# Patient Record
Sex: Female | Born: 1965 | Race: White | Hispanic: No | Smoking: Never smoker
Health system: Southern US, Community
[De-identification: ages and names within clinical notes are randomized; demographics above are authoritative.]

## PROBLEM LIST (undated history)

## (undated) DIAGNOSIS — E039 Hypothyroidism, unspecified: Secondary | ICD-10-CM

## (undated) DIAGNOSIS — Z9221 Personal history of antineoplastic chemotherapy: Secondary | ICD-10-CM

## (undated) DIAGNOSIS — Z806 Family history of leukemia: Secondary | ICD-10-CM

## (undated) DIAGNOSIS — R112 Nausea with vomiting, unspecified: Secondary | ICD-10-CM

## (undated) DIAGNOSIS — T8859XA Other complications of anesthesia, initial encounter: Secondary | ICD-10-CM

## (undated) DIAGNOSIS — Z800 Family history of malignant neoplasm of digestive organs: Secondary | ICD-10-CM

## (undated) DIAGNOSIS — M1990 Unspecified osteoarthritis, unspecified site: Secondary | ICD-10-CM

## (undated) DIAGNOSIS — R519 Headache, unspecified: Secondary | ICD-10-CM

## (undated) DIAGNOSIS — Z801 Family history of malignant neoplasm of trachea, bronchus and lung: Secondary | ICD-10-CM

## (undated) DIAGNOSIS — F32A Depression, unspecified: Secondary | ICD-10-CM

## (undated) DIAGNOSIS — J45909 Unspecified asthma, uncomplicated: Secondary | ICD-10-CM

## (undated) DIAGNOSIS — Z9889 Other specified postprocedural states: Secondary | ICD-10-CM

## (undated) DIAGNOSIS — T4145XA Adverse effect of unspecified anesthetic, initial encounter: Secondary | ICD-10-CM

## (undated) DIAGNOSIS — Z808 Family history of malignant neoplasm of other organs or systems: Secondary | ICD-10-CM

## (undated) DIAGNOSIS — F329 Major depressive disorder, single episode, unspecified: Secondary | ICD-10-CM

## (undated) DIAGNOSIS — C801 Malignant (primary) neoplasm, unspecified: Secondary | ICD-10-CM

## (undated) HISTORY — PX: SKIN GRAFT: SHX250

## (undated) HISTORY — PX: OTHER SURGICAL HISTORY: SHX169

## (undated) HISTORY — DX: Family history of malignant neoplasm of trachea, bronchus and lung: Z80.1

## (undated) HISTORY — DX: Family history of malignant neoplasm of other organs or systems: Z80.8

## (undated) HISTORY — PX: ABDOMINAL HYSTERECTOMY: SHX81

## (undated) HISTORY — DX: Family history of leukemia: Z80.6

## (undated) HISTORY — DX: Family history of malignant neoplasm of digestive organs: Z80.0

## (undated) HISTORY — PX: TONSILLECTOMY: SUR1361

---
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
## 1998-10-03 ENCOUNTER — Other Ambulatory Visit: Admission: RE | Admit: 1998-10-03 | Discharge: 1998-10-03 | Payer: Self-pay | Admitting: Obstetrics and Gynecology

## 1998-11-03 ENCOUNTER — Encounter: Admission: RE | Admit: 1998-11-03 | Discharge: 1998-11-03 | Payer: Self-pay | Admitting: Family Medicine

## 2000-01-04 ENCOUNTER — Other Ambulatory Visit: Admission: RE | Admit: 2000-01-04 | Discharge: 2000-01-04 | Payer: Self-pay | Admitting: Obstetrics and Gynecology

## 2000-02-06 HISTORY — PX: OTHER SURGICAL HISTORY: SHX169

## 2001-01-06 ENCOUNTER — Other Ambulatory Visit: Admission: RE | Admit: 2001-01-06 | Discharge: 2001-01-06 | Payer: Self-pay | Admitting: Obstetrics and Gynecology

## 2001-08-22 ENCOUNTER — Encounter: Admission: RE | Admit: 2001-08-22 | Discharge: 2001-08-22 | Payer: Self-pay | Admitting: Family Medicine

## 2002-01-20 ENCOUNTER — Other Ambulatory Visit: Admission: RE | Admit: 2002-01-20 | Discharge: 2002-01-20 | Payer: Self-pay | Admitting: Obstetrics and Gynecology

## 2003-01-06 ENCOUNTER — Encounter (INDEPENDENT_AMBULATORY_CARE_PROVIDER_SITE_OTHER): Payer: Self-pay | Admitting: *Deleted

## 2003-01-06 LAB — CONVERTED CEMR LAB

## 2003-01-14 ENCOUNTER — Other Ambulatory Visit: Admission: RE | Admit: 2003-01-14 | Discharge: 2003-01-14 | Payer: Self-pay | Admitting: Obstetrics and Gynecology

## 2003-05-24 ENCOUNTER — Encounter: Admission: RE | Admit: 2003-05-24 | Discharge: 2003-05-24 | Payer: Self-pay | Admitting: Family Medicine

## 2004-03-07 ENCOUNTER — Ambulatory Visit (HOSPITAL_COMMUNITY): Admission: RE | Admit: 2004-03-07 | Discharge: 2004-03-07 | Payer: Self-pay | Admitting: Obstetrics and Gynecology

## 2004-03-23 ENCOUNTER — Encounter: Admission: RE | Admit: 2004-03-23 | Discharge: 2004-03-23 | Payer: Self-pay | Admitting: Obstetrics and Gynecology

## 2005-03-05 ENCOUNTER — Encounter: Admission: RE | Admit: 2005-03-05 | Discharge: 2005-03-05 | Payer: Self-pay | Admitting: Obstetrics and Gynecology

## 2005-03-05 IMAGING — MG MM MAMMO DIAG BILATERAL
5 series · 5 of 5 positions shown · non-contrast
Comparison: none

DIGITAL BILATERAL DIAGNOSTIC MAMMOGRAM WITH CAD AND RIGHT BREAST ULTRASOUND:
CLINICAL DATA: The patient's physician palpates a small lump at 6 o'clock beneath the areola.

[R CC]
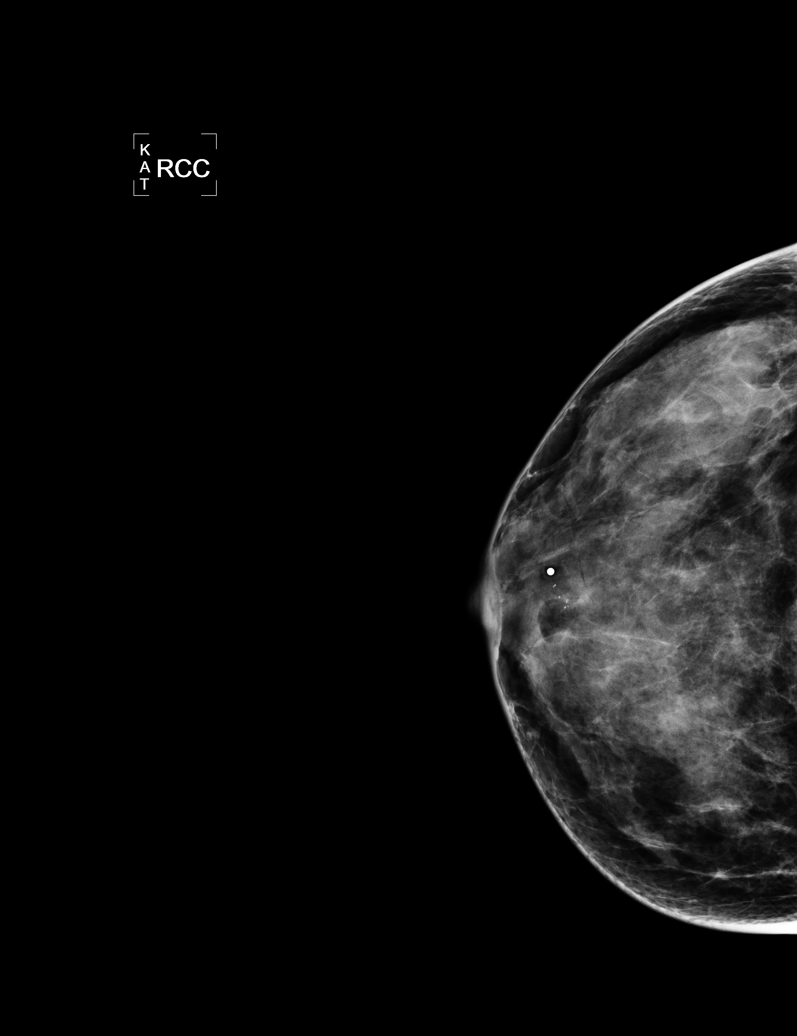

[R MLO]
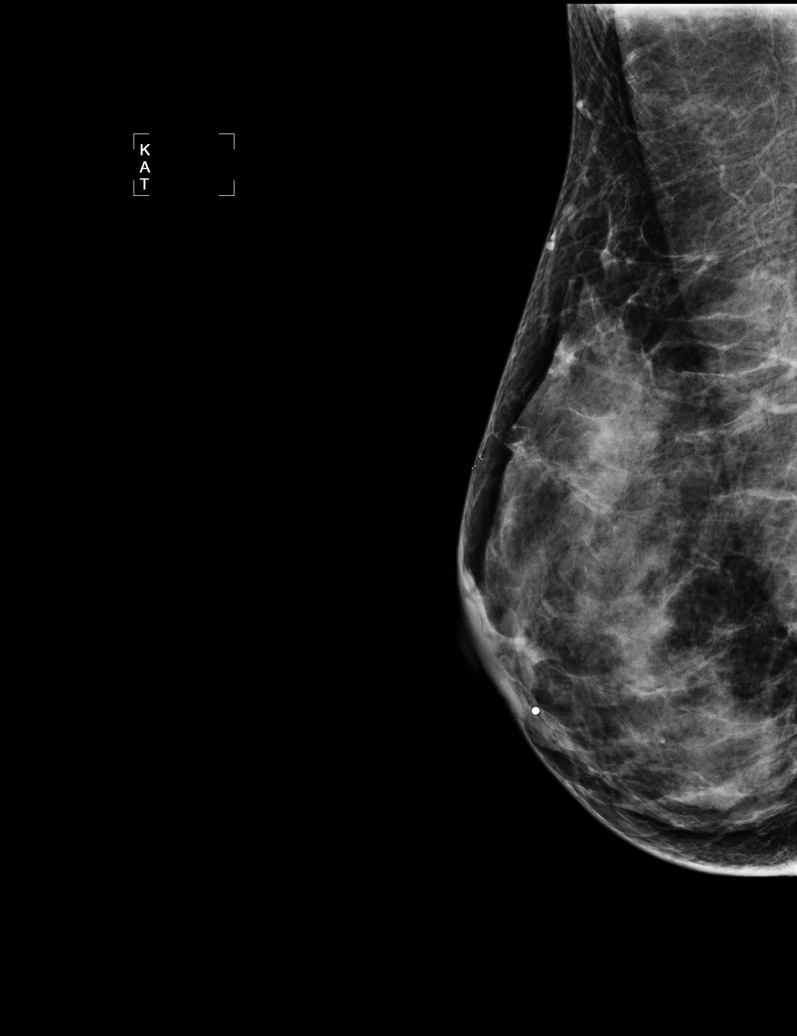

[L CC]
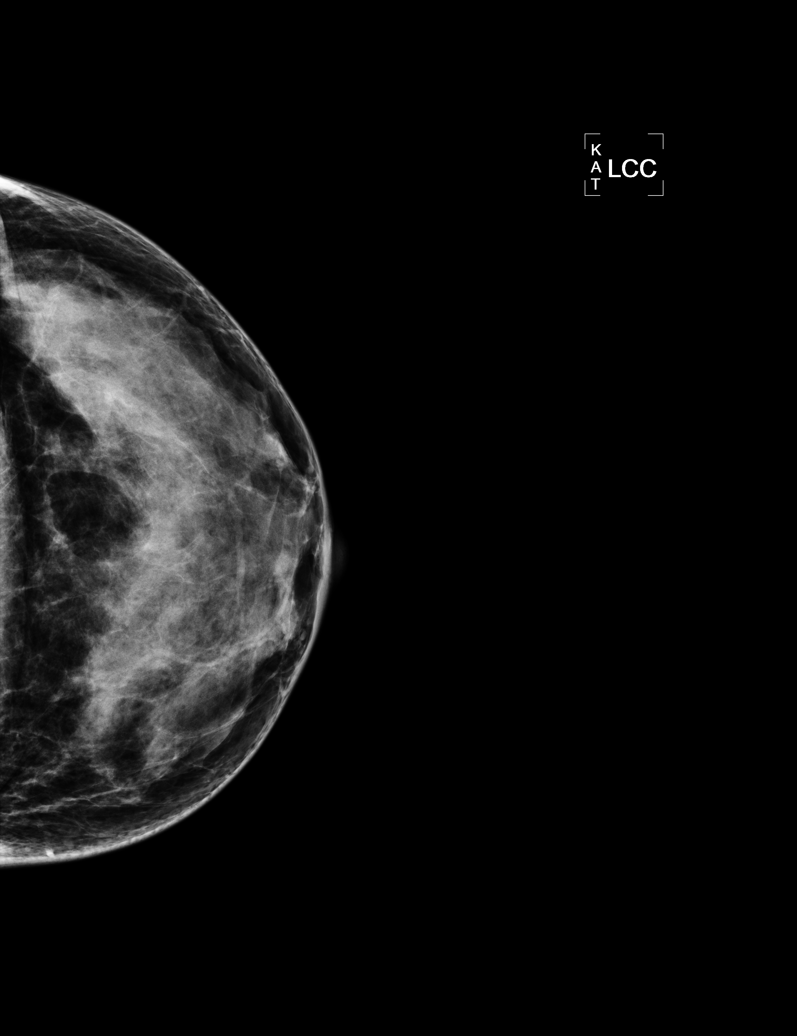

[L MLO]
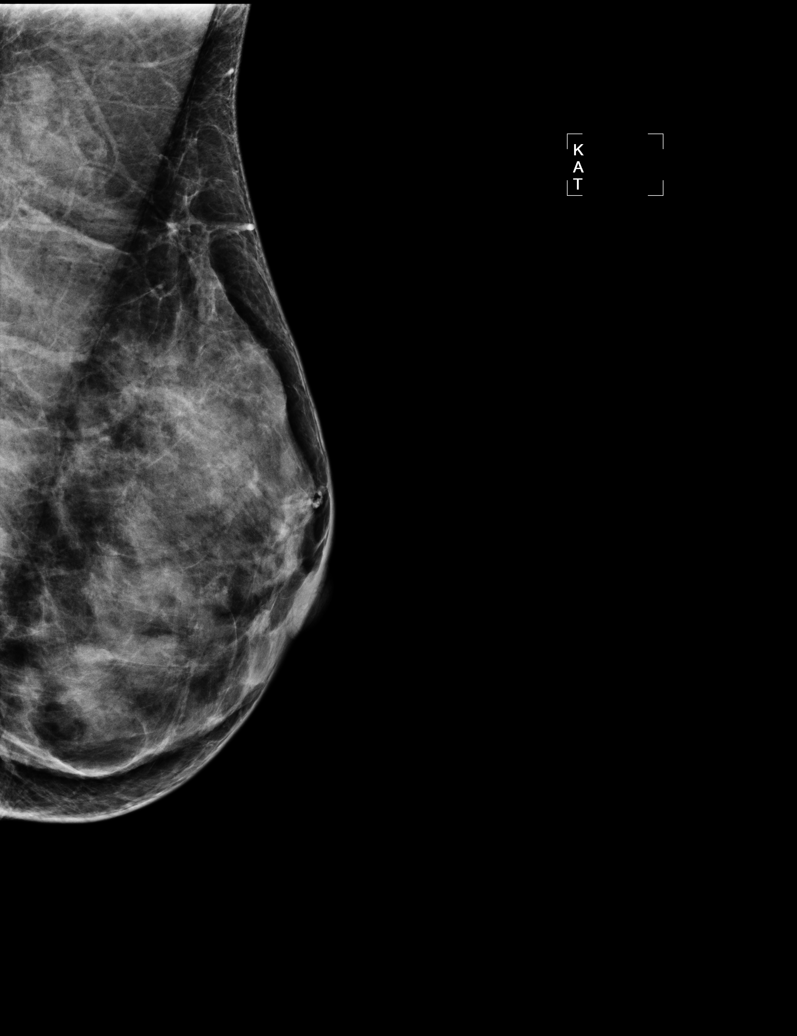

[R TAN]
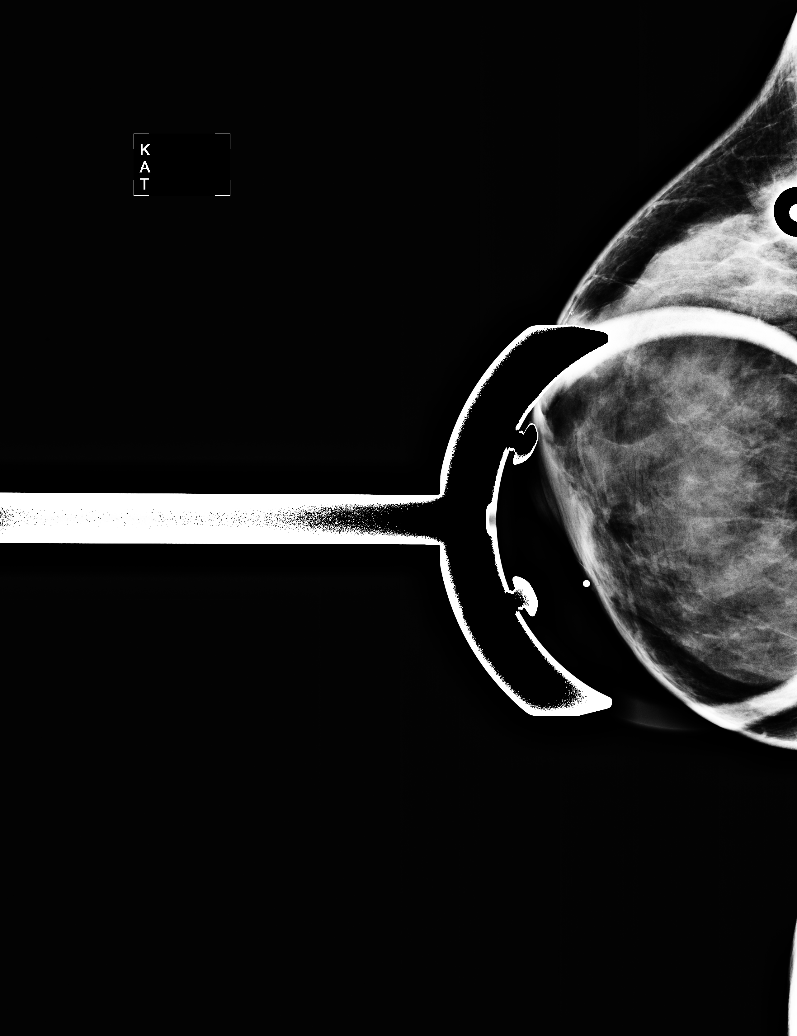

[5 of 5 positions shown; findings below may reference images not displayed]

Comparison is made to prior study dated [DATE].  The breast tissue is quite dense.  There is no 
dominant mass, architectural distortion or calcification to suggest malignancy.  Previously noted 
calcifications in the upper portion of the right breast are felt to be dermal in location and 
benign in etiology.

On physical examination, I palpate a soft flat nodular area at 6 o'clock beneath the areola.  
Sonography demonstrates predominantly dense fibroglandular tissue. There is a subcentimeter area of
what appears to be clustered cysts.  This likely represents a focus of developing apocrine 
metaplasia.  Six month follow-up sonography would be suggested.
IMPRESSION: The palpable finding at 6 o'clock in the subareolar region of the right breast corresponds to 
predominantly dense fibroglandular tissue. There is a subcentimeter focus of tiny clustered cysts, 
likely representing developing apocrine metaplasia.  Six month follow-up sonography is suggested.

ASSESSMENT: Probably benign - BI-RADS 3

Ultrasound of the right breast in 6 months.

## 2005-08-30 ENCOUNTER — Encounter: Admission: RE | Admit: 2005-08-30 | Discharge: 2005-08-30 | Payer: Self-pay | Admitting: Obstetrics and Gynecology

## 2005-08-30 IMAGING — US UNKNOWN US STUDY
1 series · 6 of 6 positions shown · non-contrast
Comparison: [DATE].

RIGHT BREAST ULTRASOUND

RIGHT BREAST ULTRASOUND:
CLINICAL DATA: 40-year-old female with right breast lump, six-month ultrasound reevaluation.

[Series 2: unknown us study · 6 of 6 slices shown]
[im 1/6]
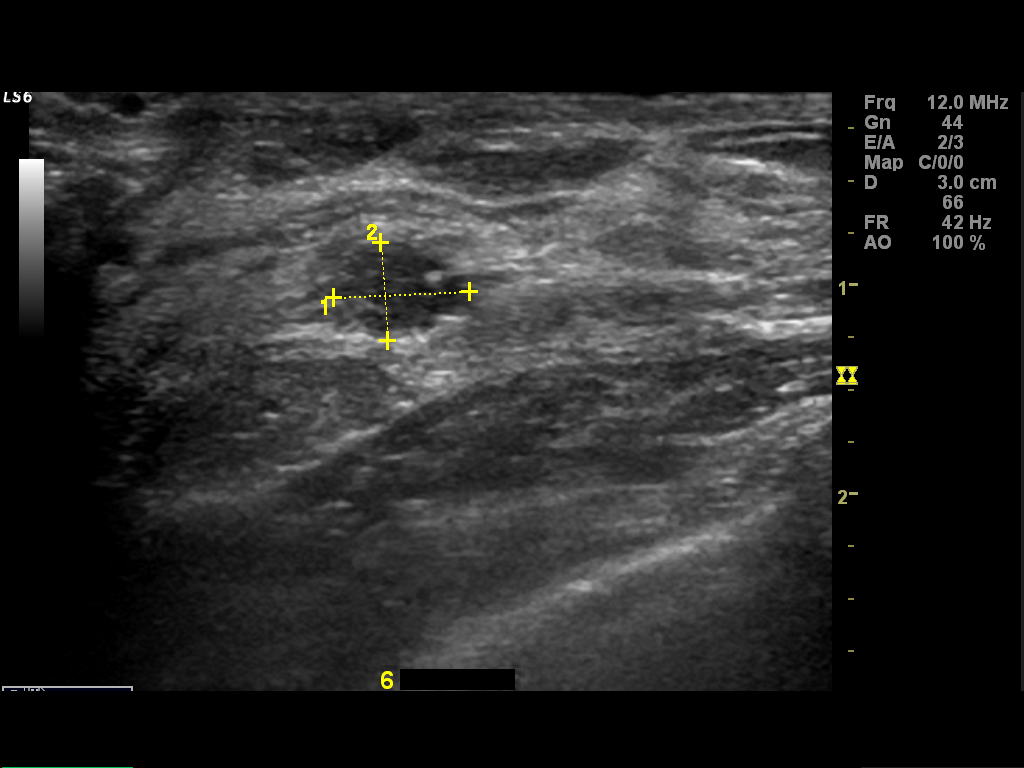
[im 2/6]
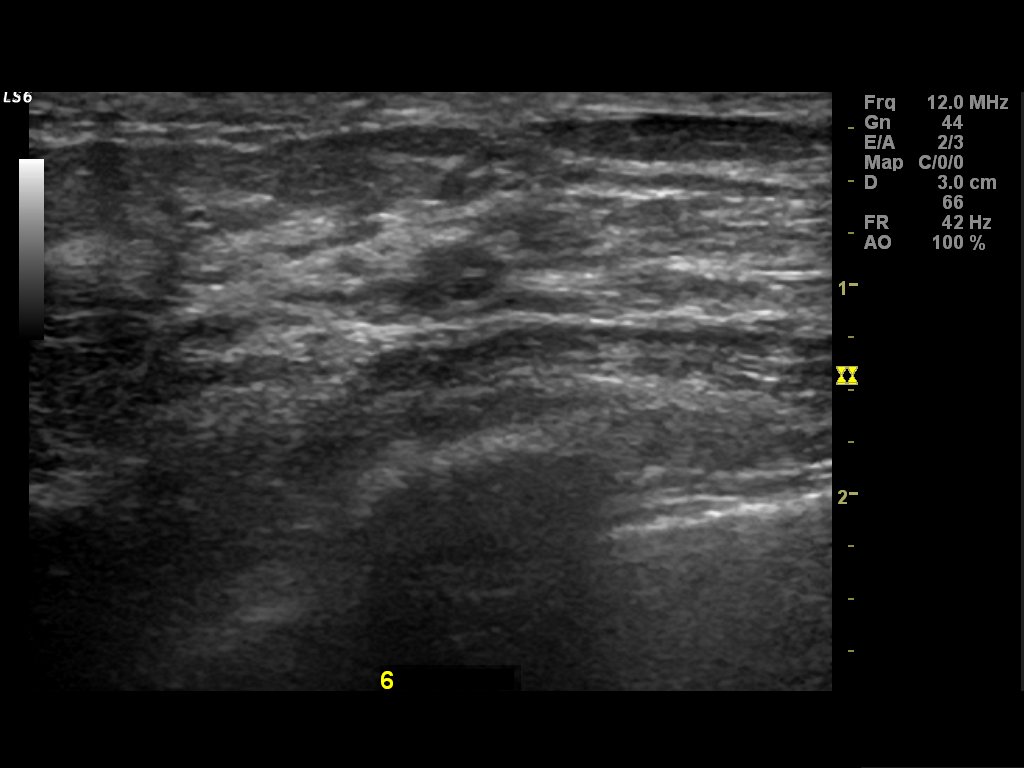
[im 3/6]
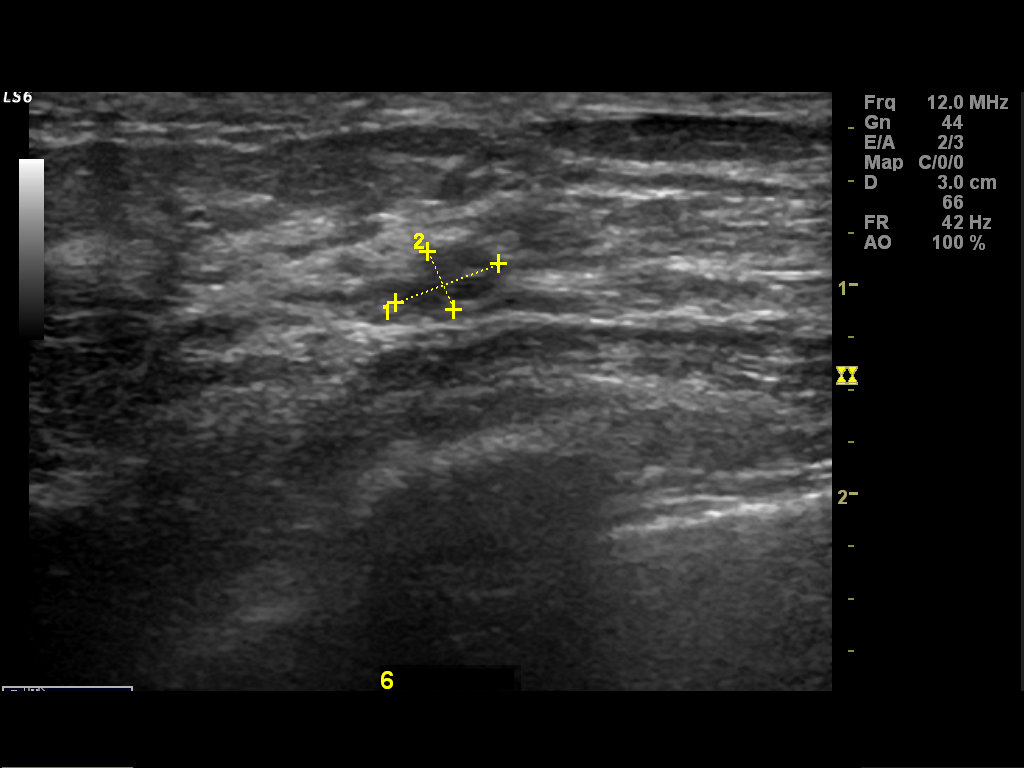
[im 4/6]
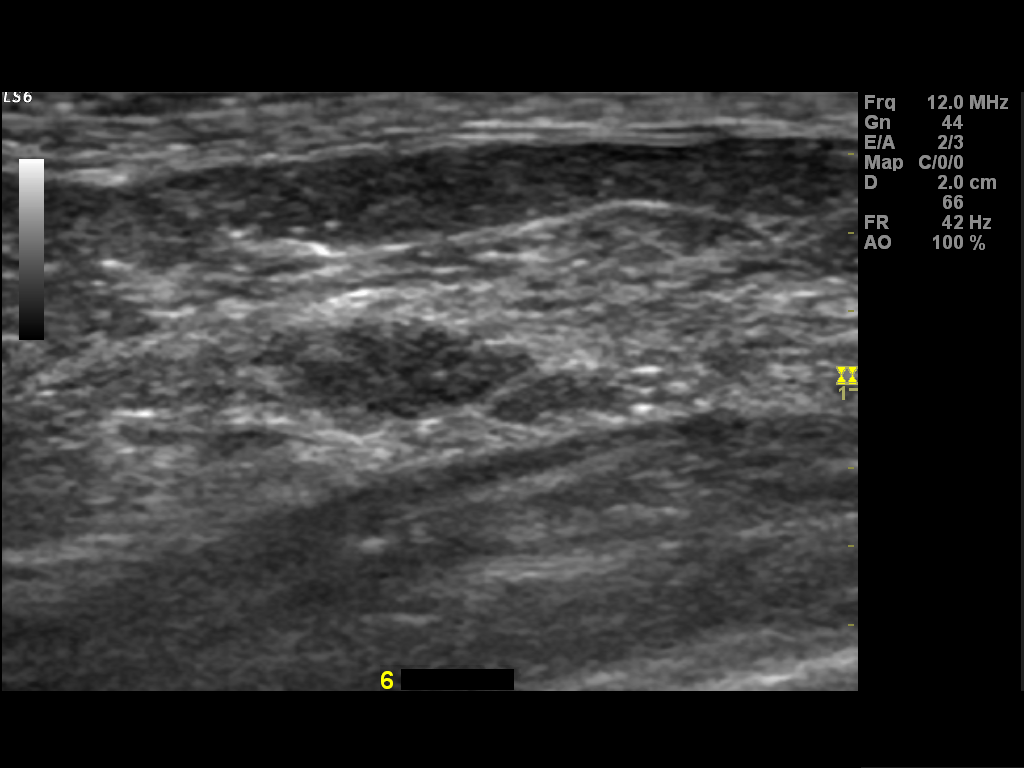
[im 5/6]
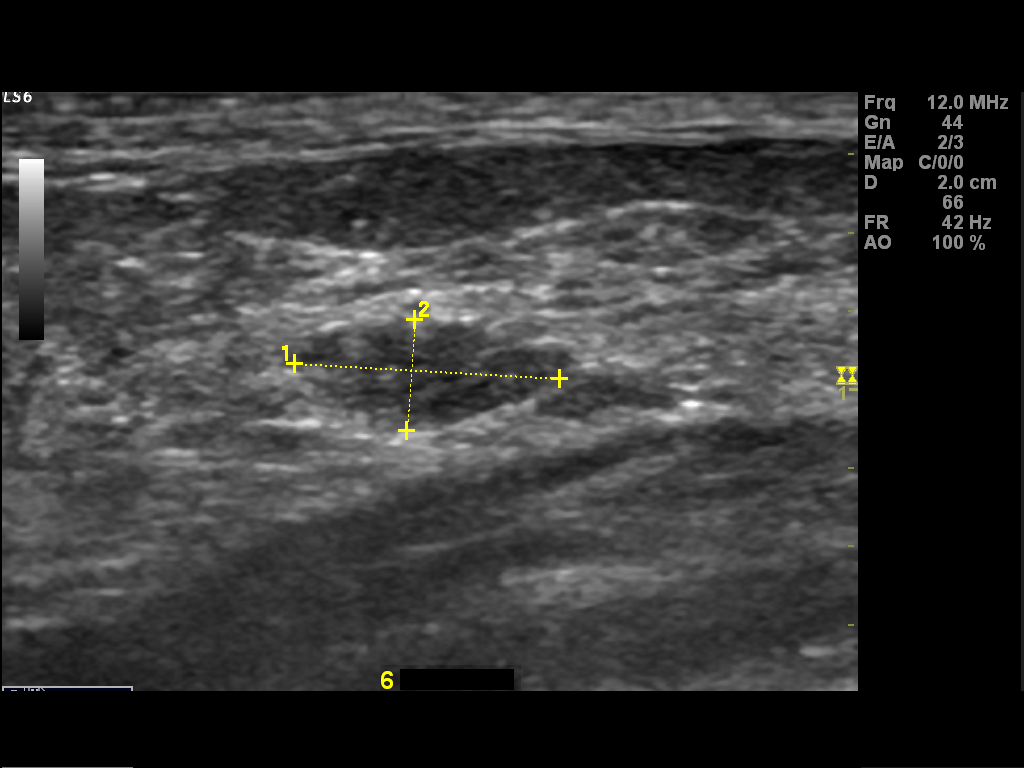
[im 6/6]
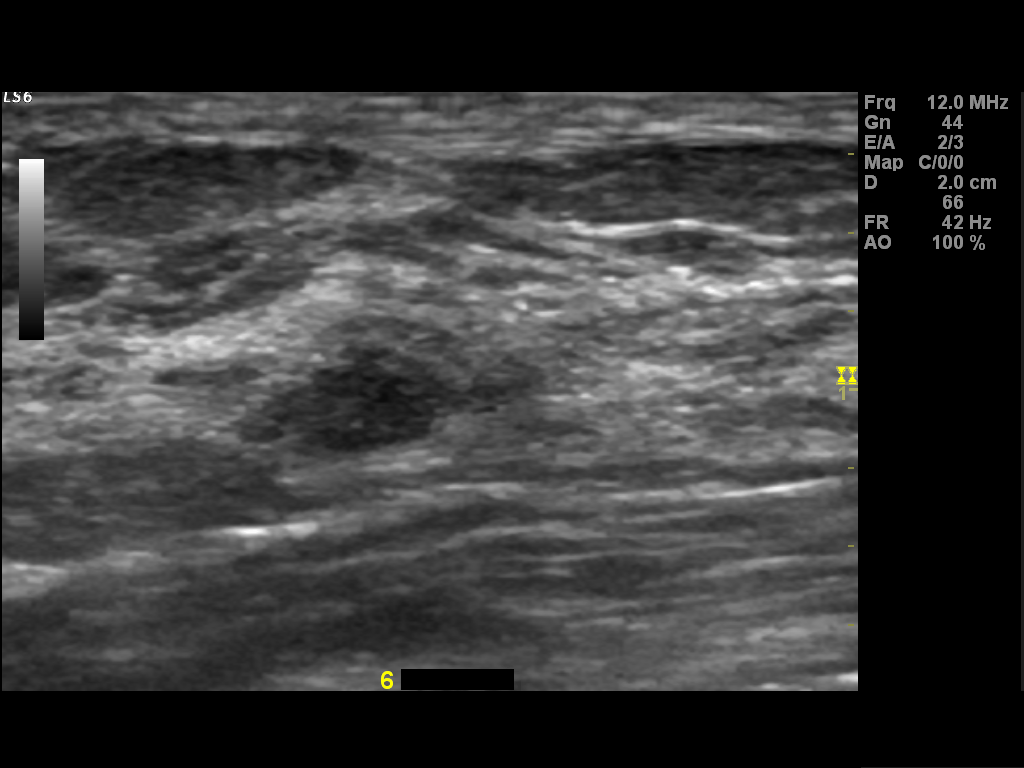

[6 of 6 positions shown; findings below may reference images not displayed]

On physical examination a soft nodular area at the 6 o'clock position in the subareolar right 
breast is palpated.

Targeted ultrasound of the right breast demonstrates a subcentimeter area of what appears to be 
apocrine metaplasia.  There has been little interval change since the prior study.  Dense 
fibroglandular tissue in the surrounding areas is also noted.
IMPRESSION: Stable probably benign apocrine metaplasia in the right breast.  No specific evidence of 
malignancy.  Recommend screening mammogram in one year.

ASSESSMENT: Benign - BI-RADS 2

Screening mammogram of both breasts in 1 year.
,

## 2006-04-04 DIAGNOSIS — L708 Other acne: Secondary | ICD-10-CM | POA: Insufficient documentation

## 2006-04-04 DIAGNOSIS — M5382 Other specified dorsopathies, cervical region: Secondary | ICD-10-CM | POA: Insufficient documentation

## 2006-04-04 DIAGNOSIS — J309 Allergic rhinitis, unspecified: Secondary | ICD-10-CM | POA: Insufficient documentation

## 2006-04-05 ENCOUNTER — Encounter (INDEPENDENT_AMBULATORY_CARE_PROVIDER_SITE_OTHER): Payer: Self-pay | Admitting: *Deleted

## 2006-05-09 ENCOUNTER — Ambulatory Visit (HOSPITAL_COMMUNITY): Admission: RE | Admit: 2006-05-09 | Discharge: 2006-05-10 | Payer: Self-pay | Admitting: Obstetrics and Gynecology

## 2006-05-09 ENCOUNTER — Encounter (INDEPENDENT_AMBULATORY_CARE_PROVIDER_SITE_OTHER): Payer: Self-pay | Admitting: *Deleted

## 2006-08-30 ENCOUNTER — Encounter: Admission: RE | Admit: 2006-08-30 | Discharge: 2006-08-30 | Payer: Self-pay | Admitting: Obstetrics & Gynecology

## 2006-08-30 IMAGING — MG MM SCREEN MAMMOGRAM BILATERAL
4 series · 4 of 4 positions shown · non-contrast
Comparison: Prior studies.

DG SCREEN MAMMOGRAM BILATERAL
Bilateral CC and MLO view(s) were taken.
Prior study comparison: [DATE], bilateral screening mammogram, performed at [facility]REDACT[facility] at The [facility]ACTED].

DIGITAL SCREENING MAMMOGRAM WITH CAD:

[R CC]
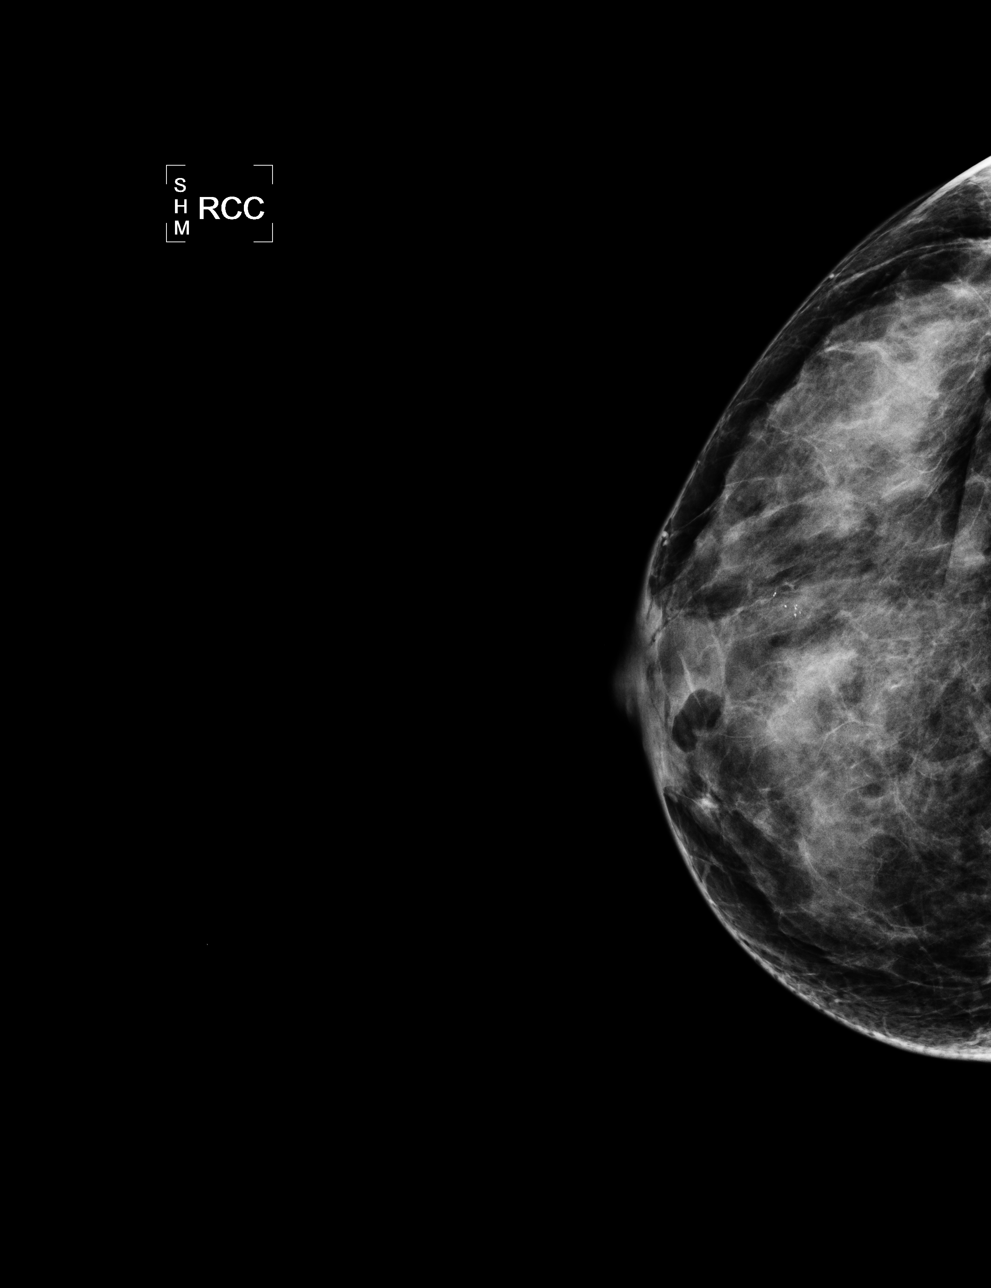

[L CC]
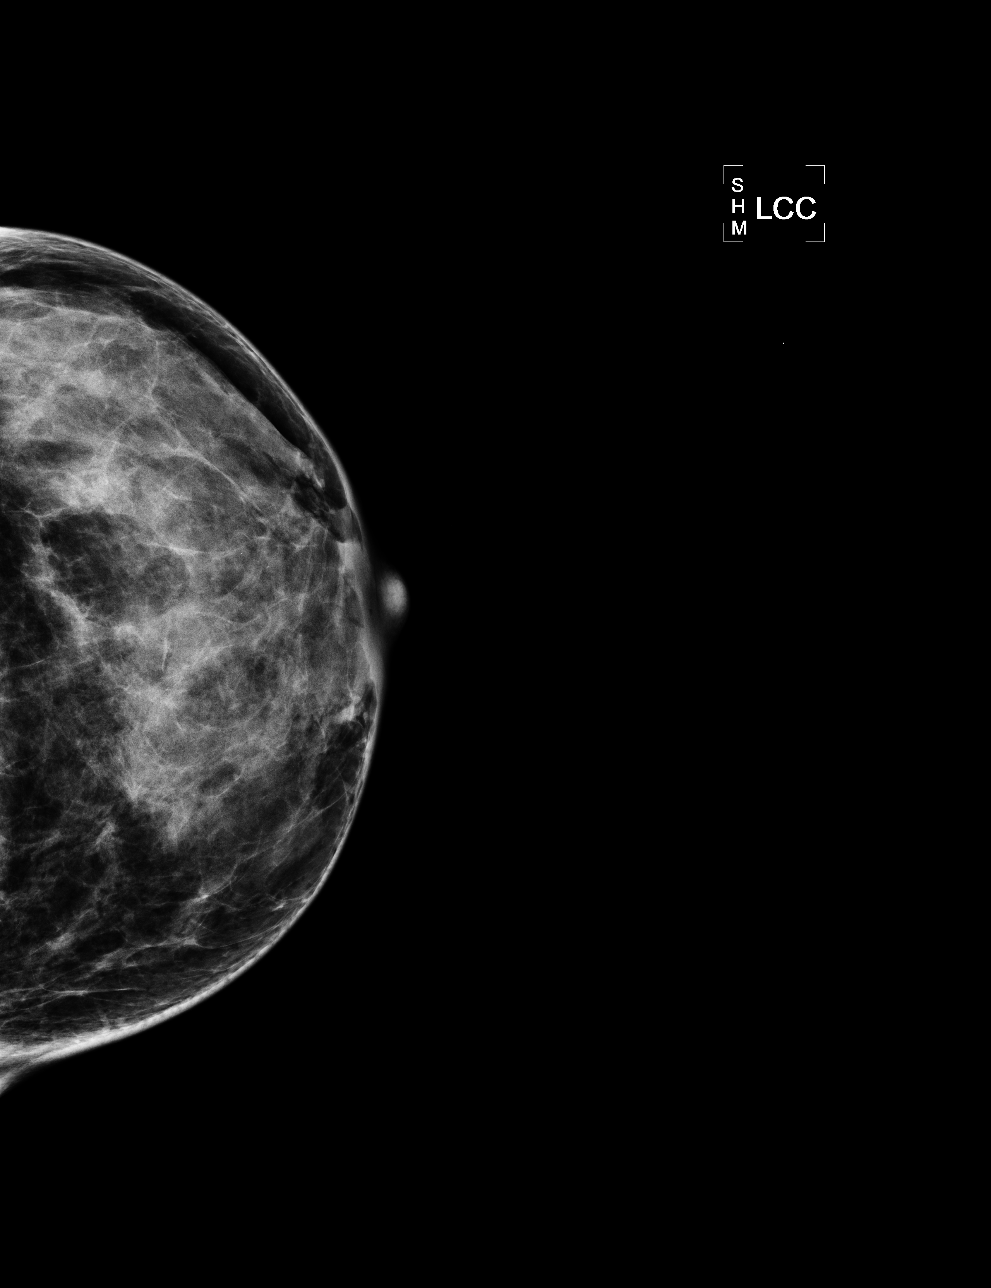

[L MLO]
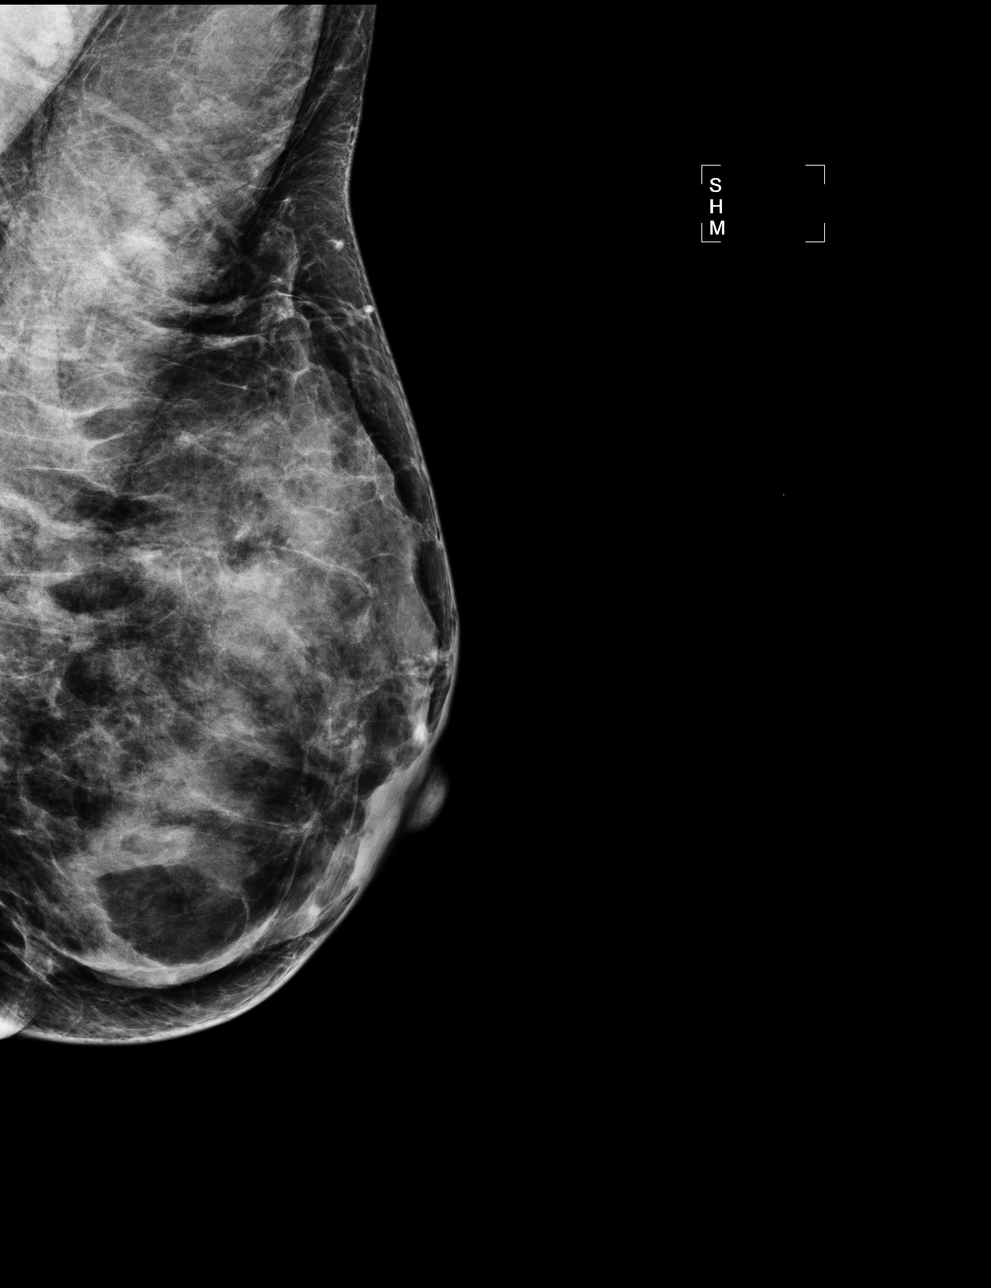

[R MLO]
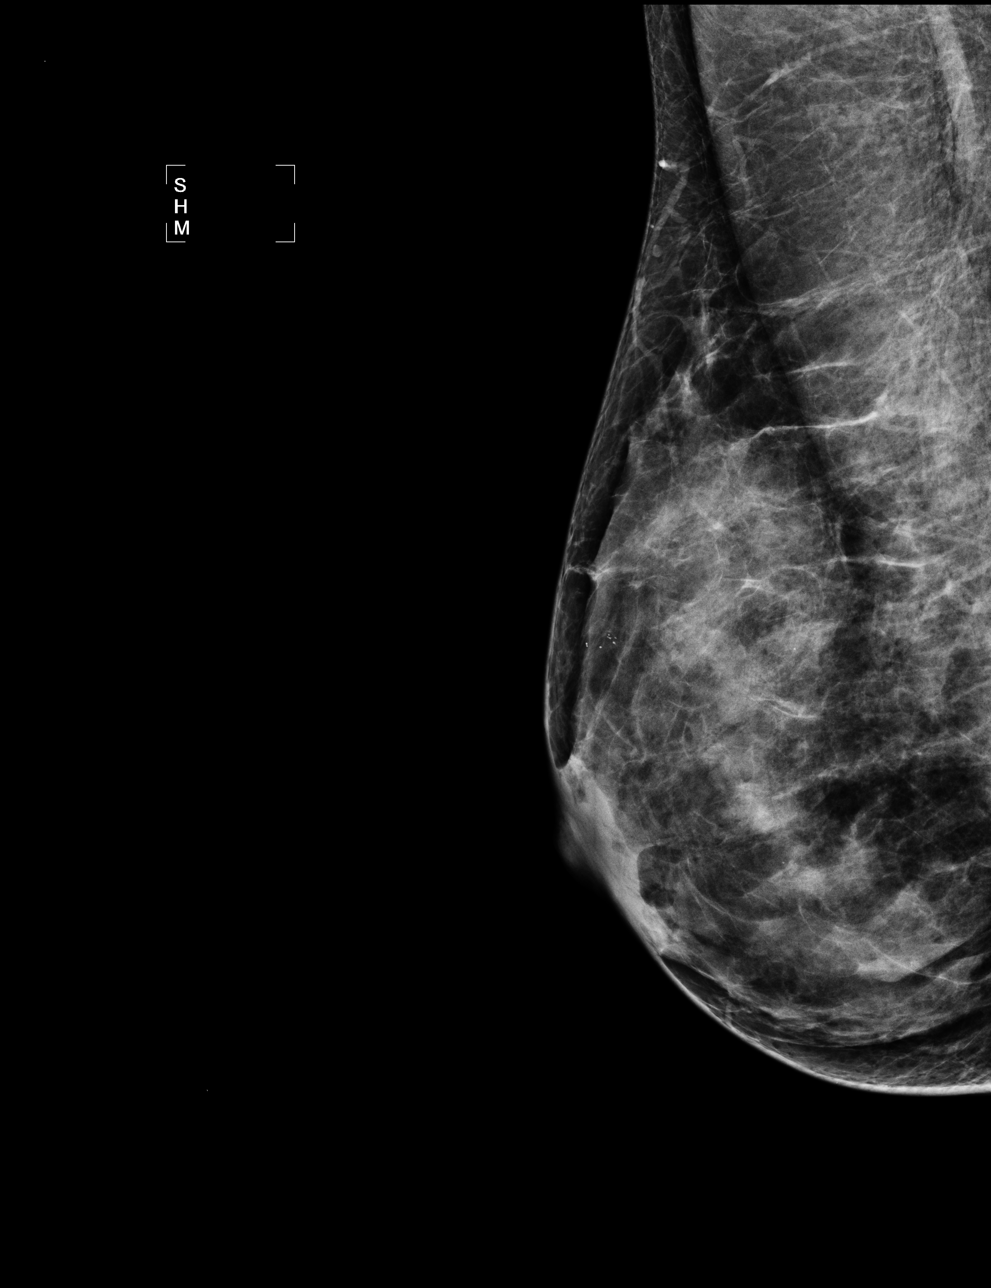

[4 of 4 positions shown; findings below may reference images not displayed]

The breast tissue is extremely dense.  There is no dominant mass, architectural distortion or 
calcification to suggest malignancy.
IMPRESSION: No mammographic evidence of malignancy.  Suggest yearly screening mammography.

ASSESSMENT: Negative - BI-RADS 1

Screening mammogram in 1 year.
ANALYZED BY COMPUTER AIDED DETECTION. , THIS PROCEDURE WAS A DIGITAL MAMMOGRAM.

## 2007-09-16 ENCOUNTER — Encounter: Admission: RE | Admit: 2007-09-16 | Discharge: 2007-09-16 | Payer: Self-pay | Admitting: Obstetrics & Gynecology

## 2007-09-16 IMAGING — MG MM SCREEN MAMMOGRAM BILATERAL
4 series · 4 of 4 positions shown · non-contrast
Comparison: Prior studies.

DG SCREEN MAMMOGRAM BILATERAL
Bilateral CC and MLO view(s) were taken.
Prior study comparison: [DATE], bilateral diagnostic mammogram.  [DATE], right 
breast ultrasound.

DIGITAL SCREENING MAMMOGRAM WITH CAD:

[R CC]
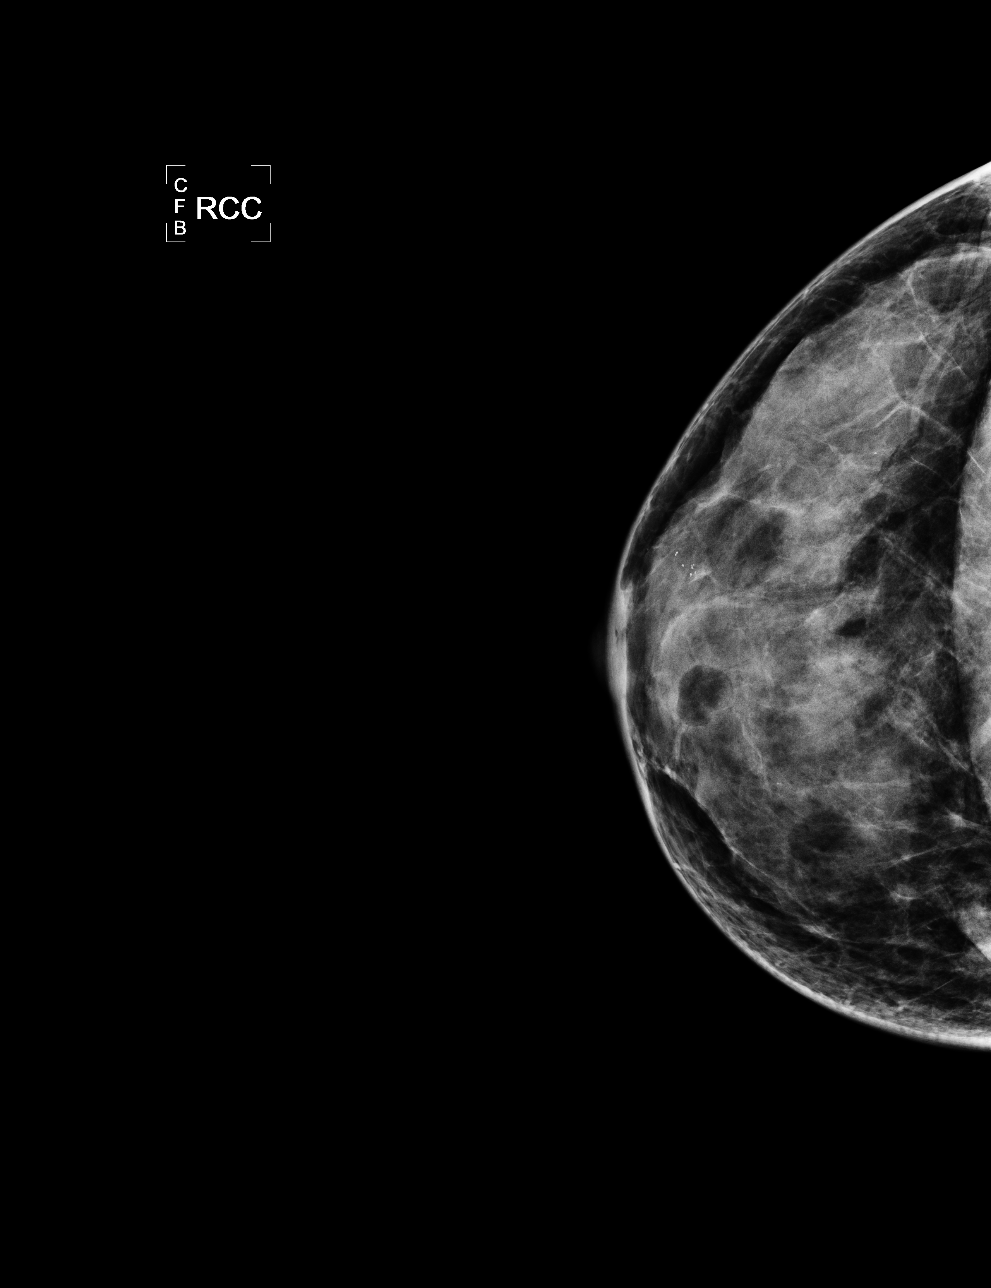

[L CC]
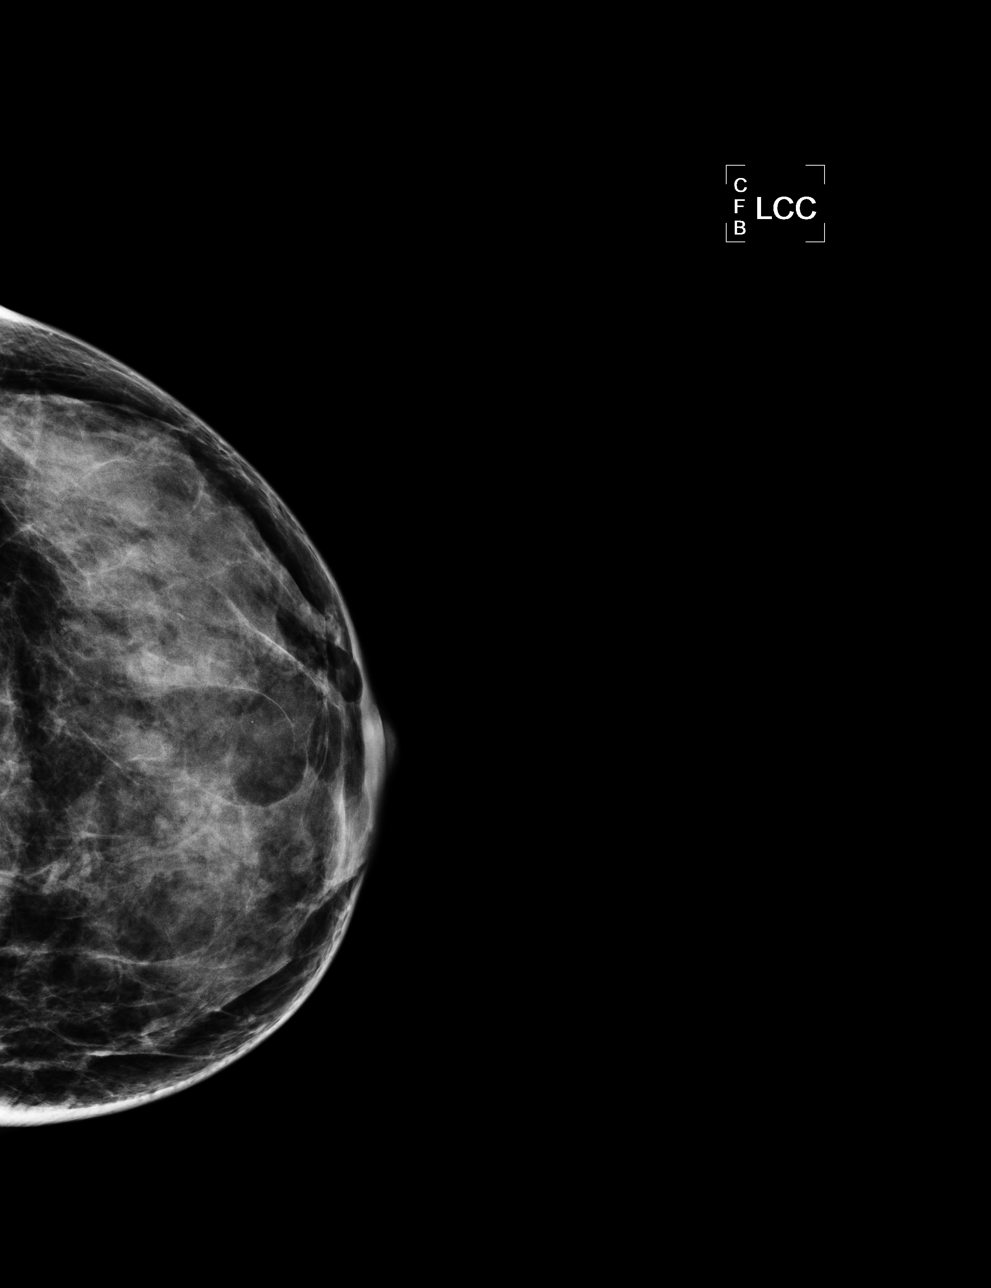

[L MLO]
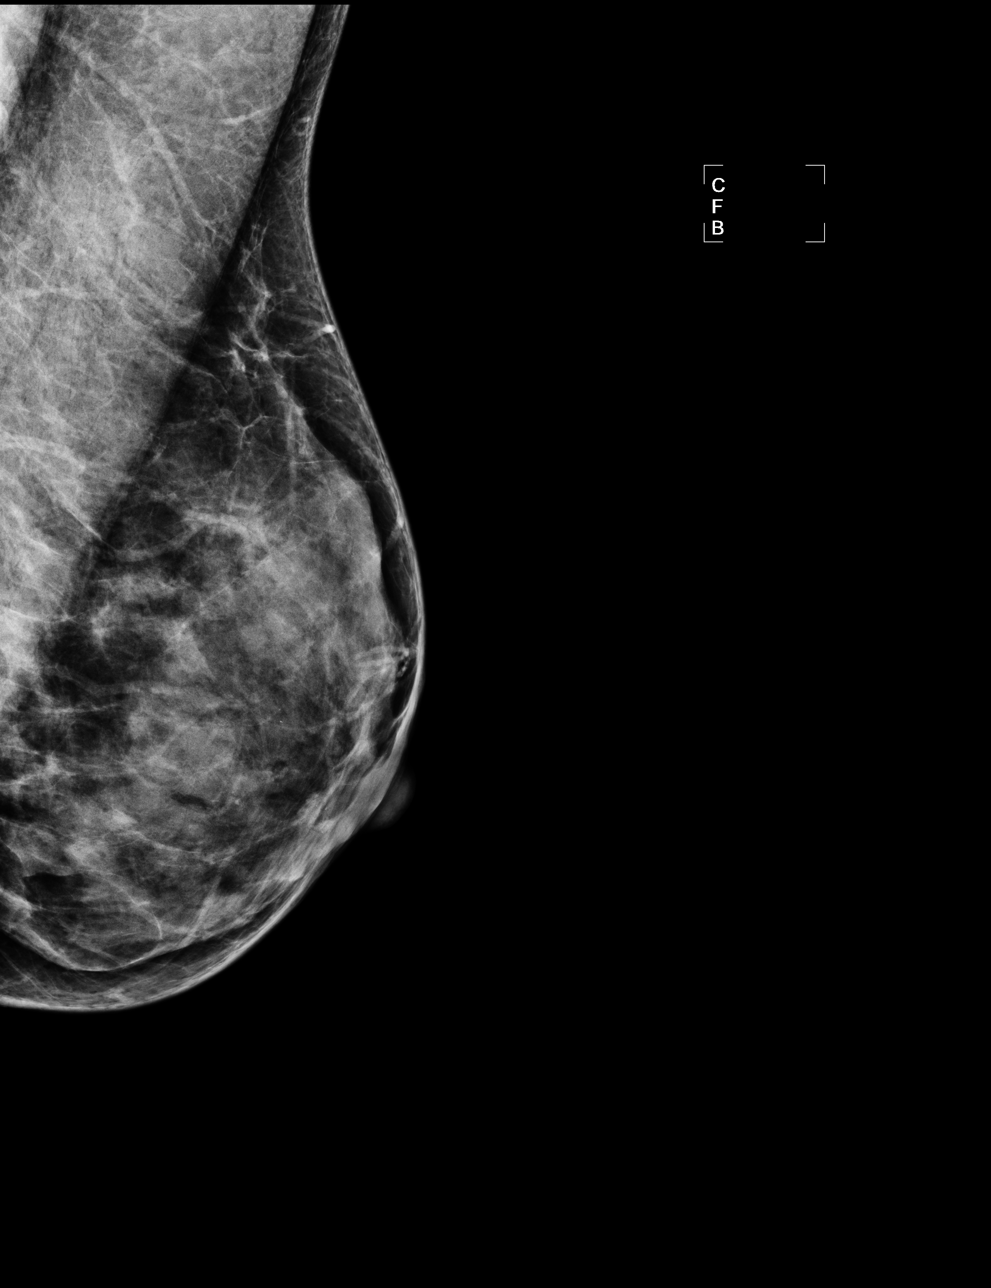

[R MLO]
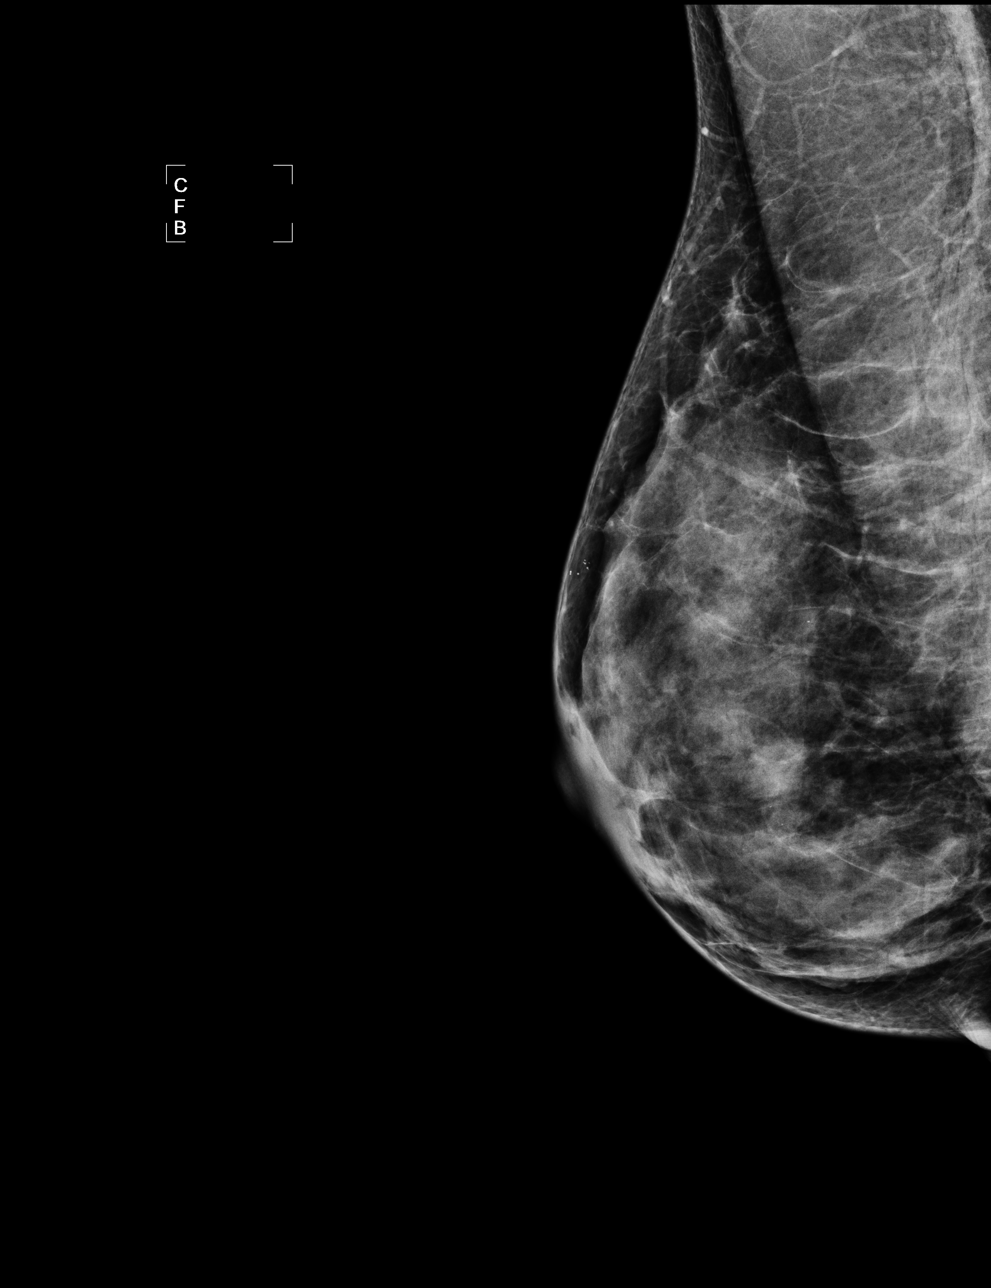

[4 of 4 positions shown; findings below may reference images not displayed]

The breast tissue is extremely dense.  There is no dominant mass, architectural distortion or 
calcification to suggest malignancy.
IMPRESSION: No mammographic evidence of malignancy.  Suggest yearly screening mammography.

ASSESSMENT: Negative - BI-RADS 1

Screening mammogram in 1 year.
ANALYZED BY COMPUTER AIDED DETECTION. , THIS PROCEDURE WAS A DIGITAL MAMMOGRAM.

## 2008-10-21 ENCOUNTER — Observation Stay (HOSPITAL_COMMUNITY): Admission: EM | Admit: 2008-10-21 | Discharge: 2008-10-22 | Payer: Self-pay | Admitting: Emergency Medicine

## 2008-10-21 LAB — CONVERTED CEMR LAB: TSH: 2.1 microintl units/mL

## 2008-10-21 IMAGING — CT CT ANGIO CHEST
1 of 2 series · 19 of 30 positions shown · IV contrast (APPLIED)
Comparison: Chest x-ray of [DATE]

CLINICAL DATA: Chest pain, shortness of breath

CT ANGIOGRAPHY CHEST WITH CONTRAST
TECHNIQUE: Multidetector CT imaging of the chest was performed
using the standard protocol during bolus administration of
intravenous contrast. Multiplanar CT image reconstructions
including MIPs were obtained to evaluate the vascular anatomy.
Contrast: 80 ml [FL]

[Series 6: pe thins @ 1mm · axial · 0.62mm/px · z∈[+230,+484]mm · 19 of 284 slices shown]
[im 15/284  lung]
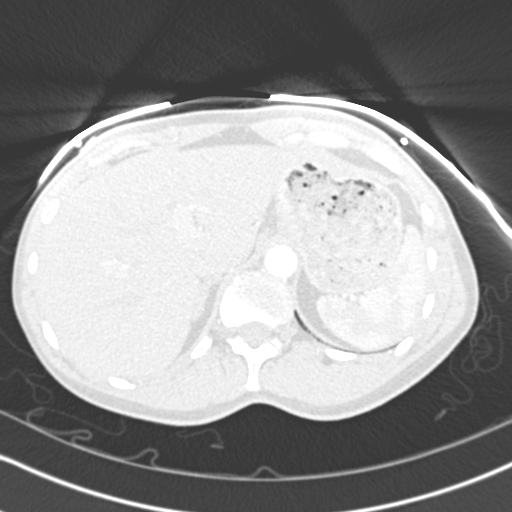
[im 29/284  mediastinal]
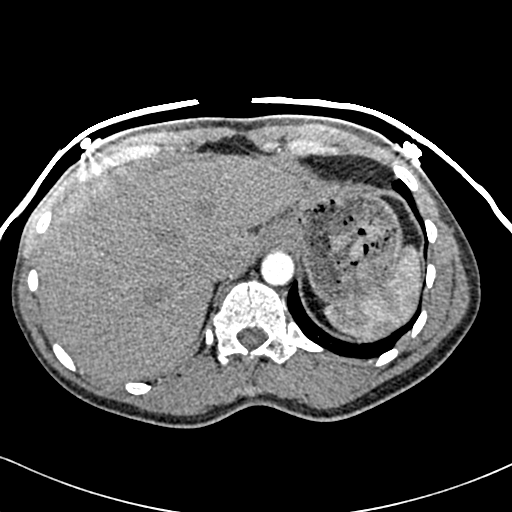
[im 43/284  lung]
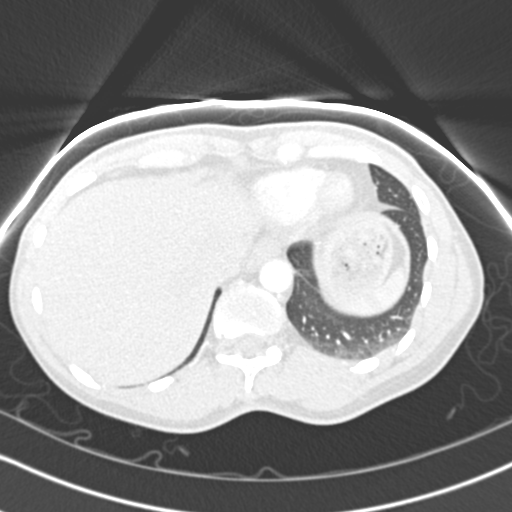
[im 57/284  mediastinal]
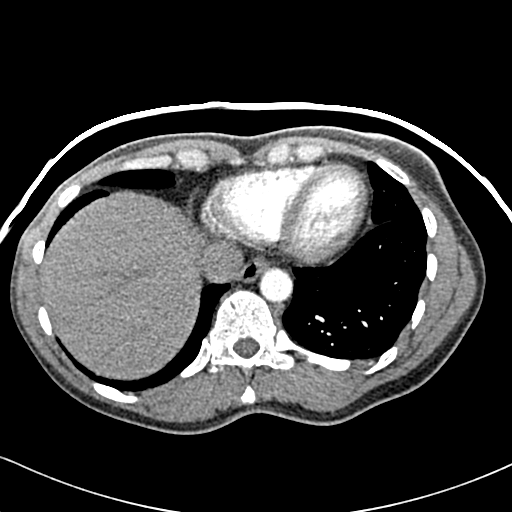
[im 71/284  lung]
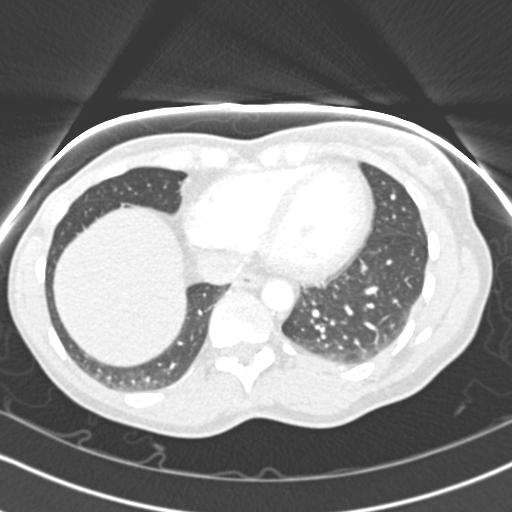
[im 85/284  mediastinal]
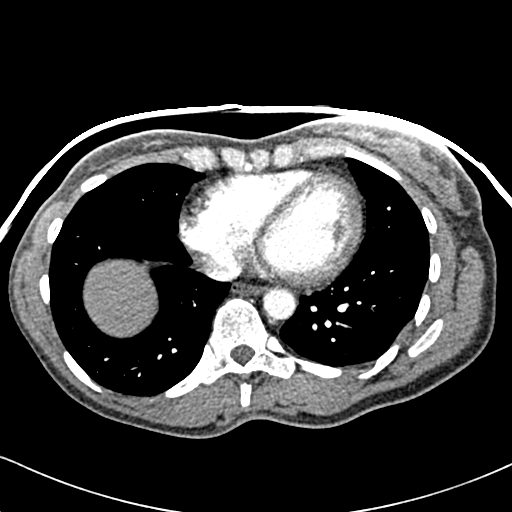
[im 100/284  lung]
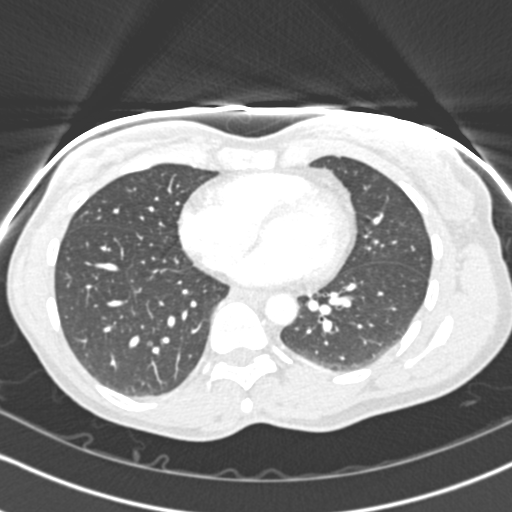
[im 114/284  mediastinal]
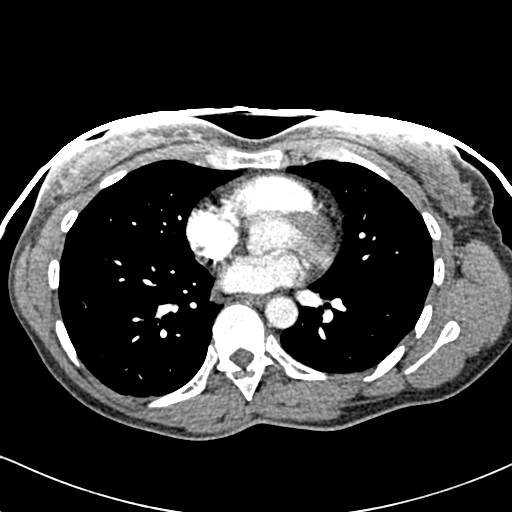
[im 128/284  lung]
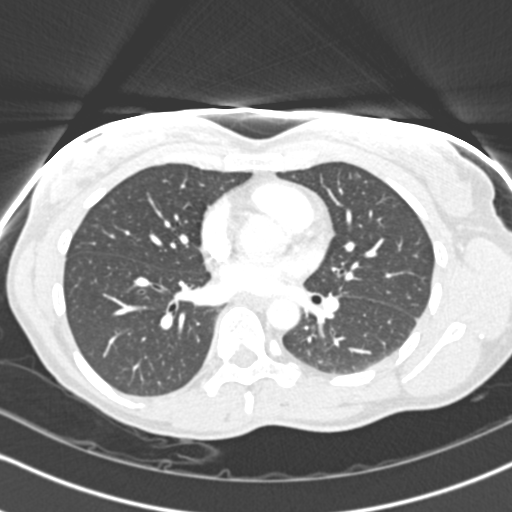
[im 142/284  mediastinal]
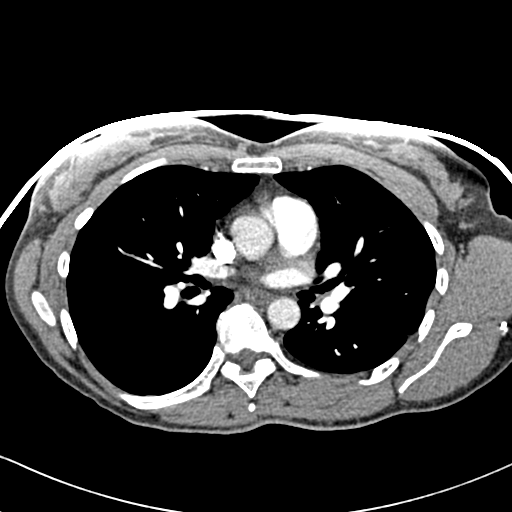
[im 156/284  lung]
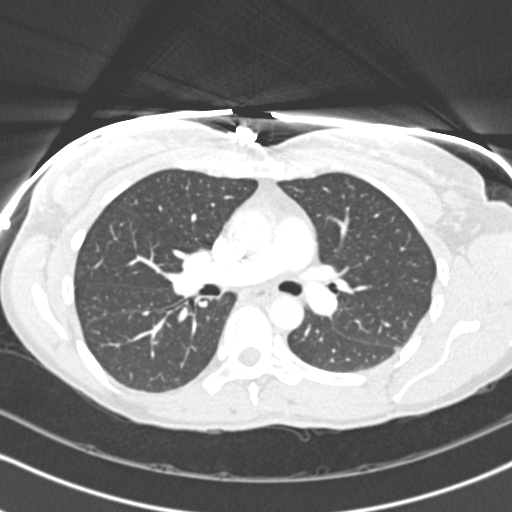
[im 170/284  mediastinal]
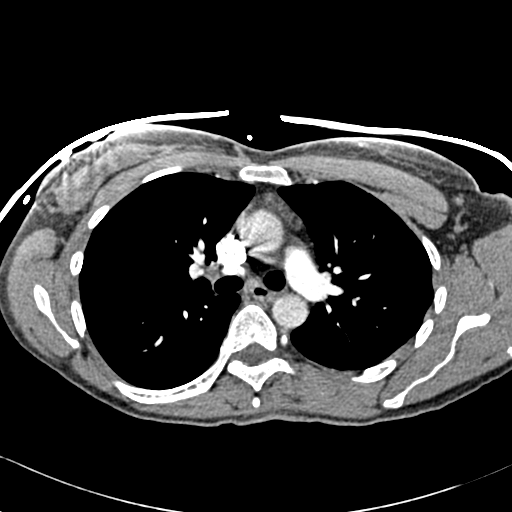
[im 184/284  lung]
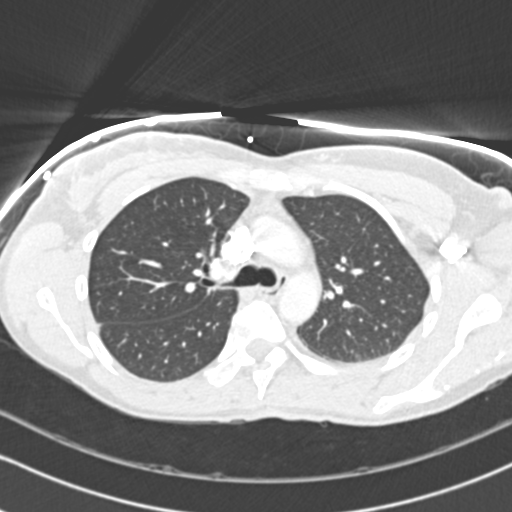
[im 199/284  mediastinal]
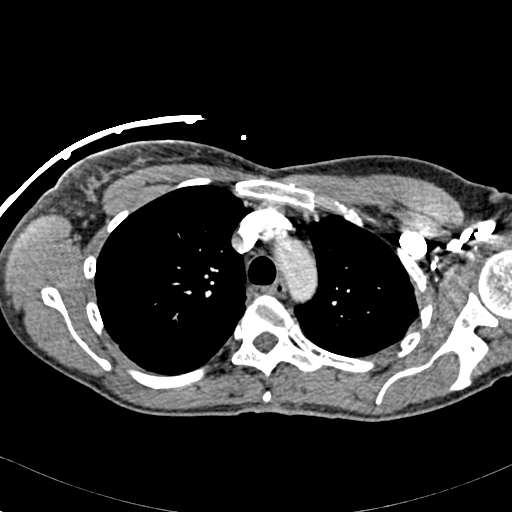
[im 213/284  lung]
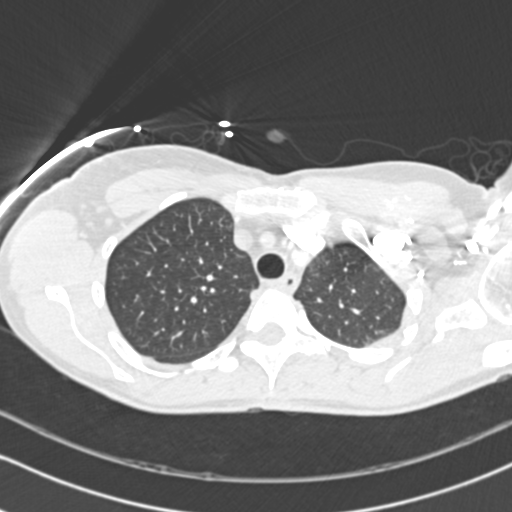
[im 227/284  mediastinal]
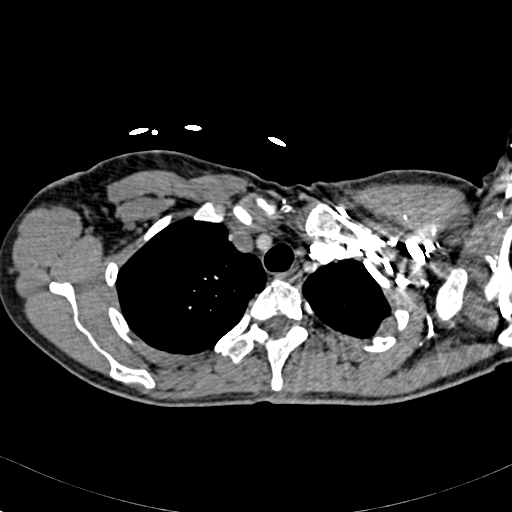
[im 241/284  lung]
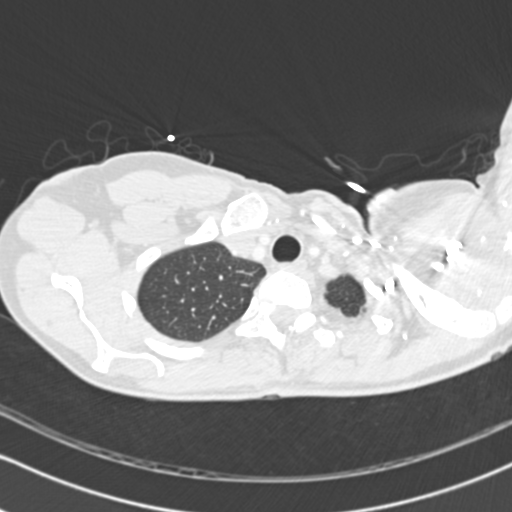
[im 255/284  mediastinal]
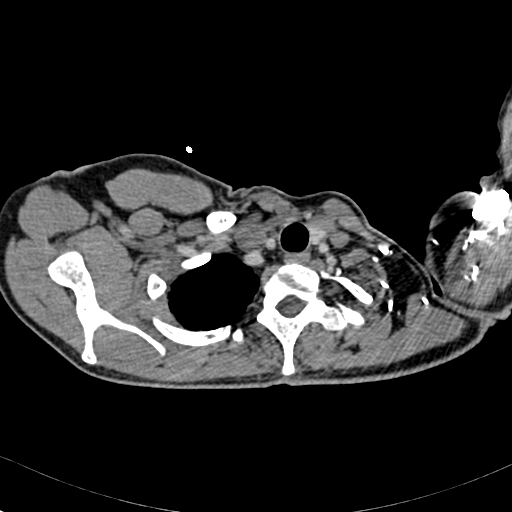
[im 269/284  lung]
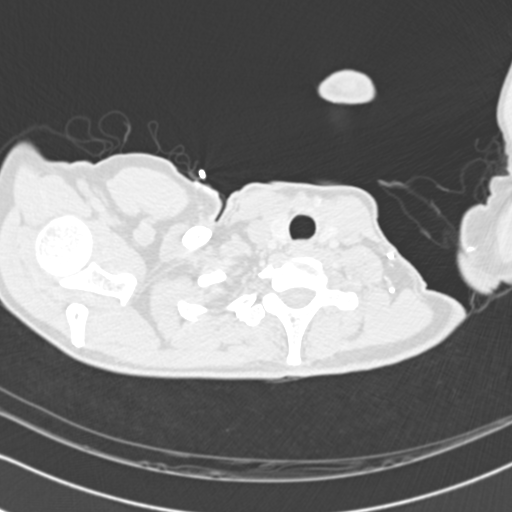

[19 of 30 positions shown; findings below may reference images not displayed]

FINDINGS: The pulmonary arteries opacify and there is no evidence
of acute pulmonary embolism.  The thoracic aorta opacifies with no
acute abnormality noted.  No mediastinal or hilar adenopathy is
seen.  The heart is within upper limits of normal in size.
Incidental note is made of a small low attenuation right thyroid
nodule.

On the lung window images no lung nodule, infiltrate, or pleural
effusion is seen.  There does appear to be a thoracic scoliosis
present.  No acute bony abnormality is seen.

Review of the MIP images confirms the above findings.
IMPRESSION: 1.  No acute pulmonary embolism is seen.
2.  No abnormality of the thoracic aorta is noted.
3.  No infiltrate, lung nodule, or pleural effusion is seen.
4.  Incidental small low attenuation nodule in the right lobe of
thyroid.

## 2008-10-21 IMAGING — CR DG CHEST 2V
2 series · 2 of 2 positions shown · non-contrast
Comparison: None

CLINICAL DATA: Chest pain

CHEST - 2 VIEW

[w chest pa]
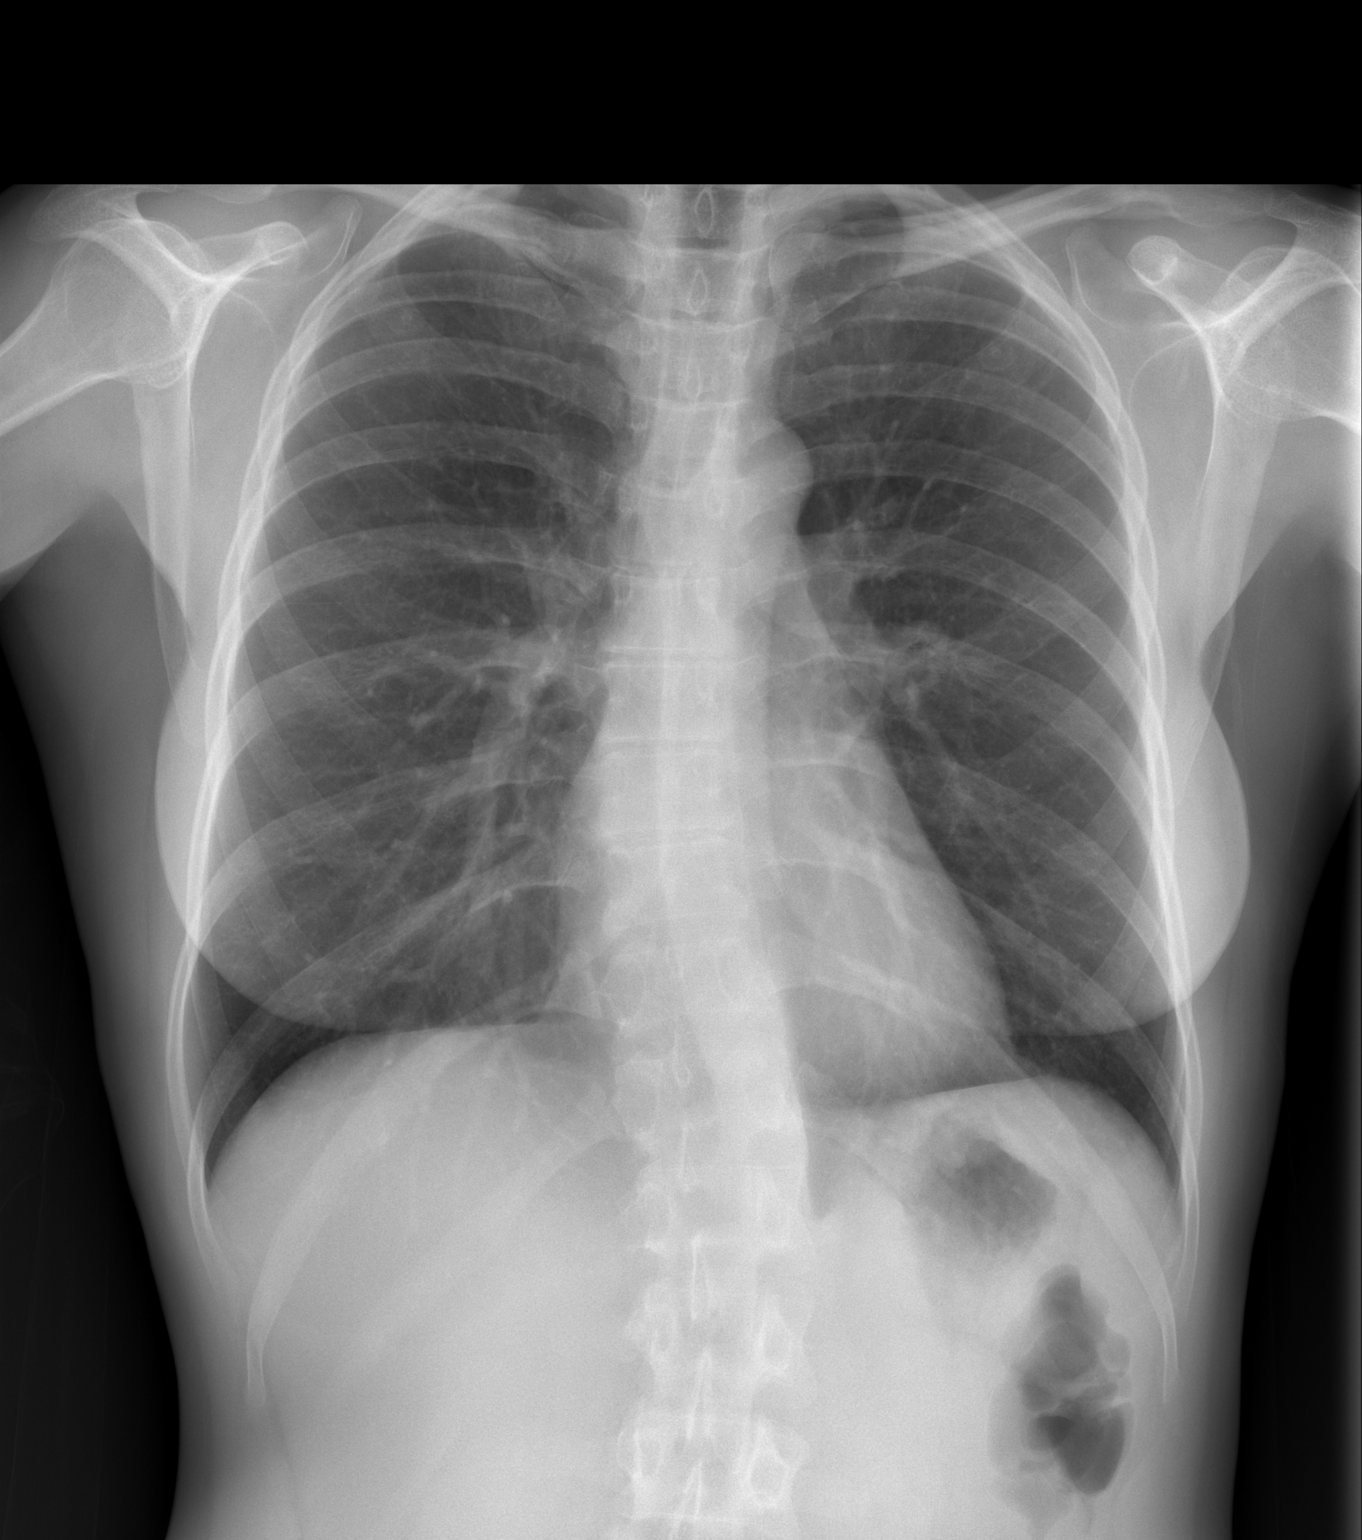

[w chest lat]
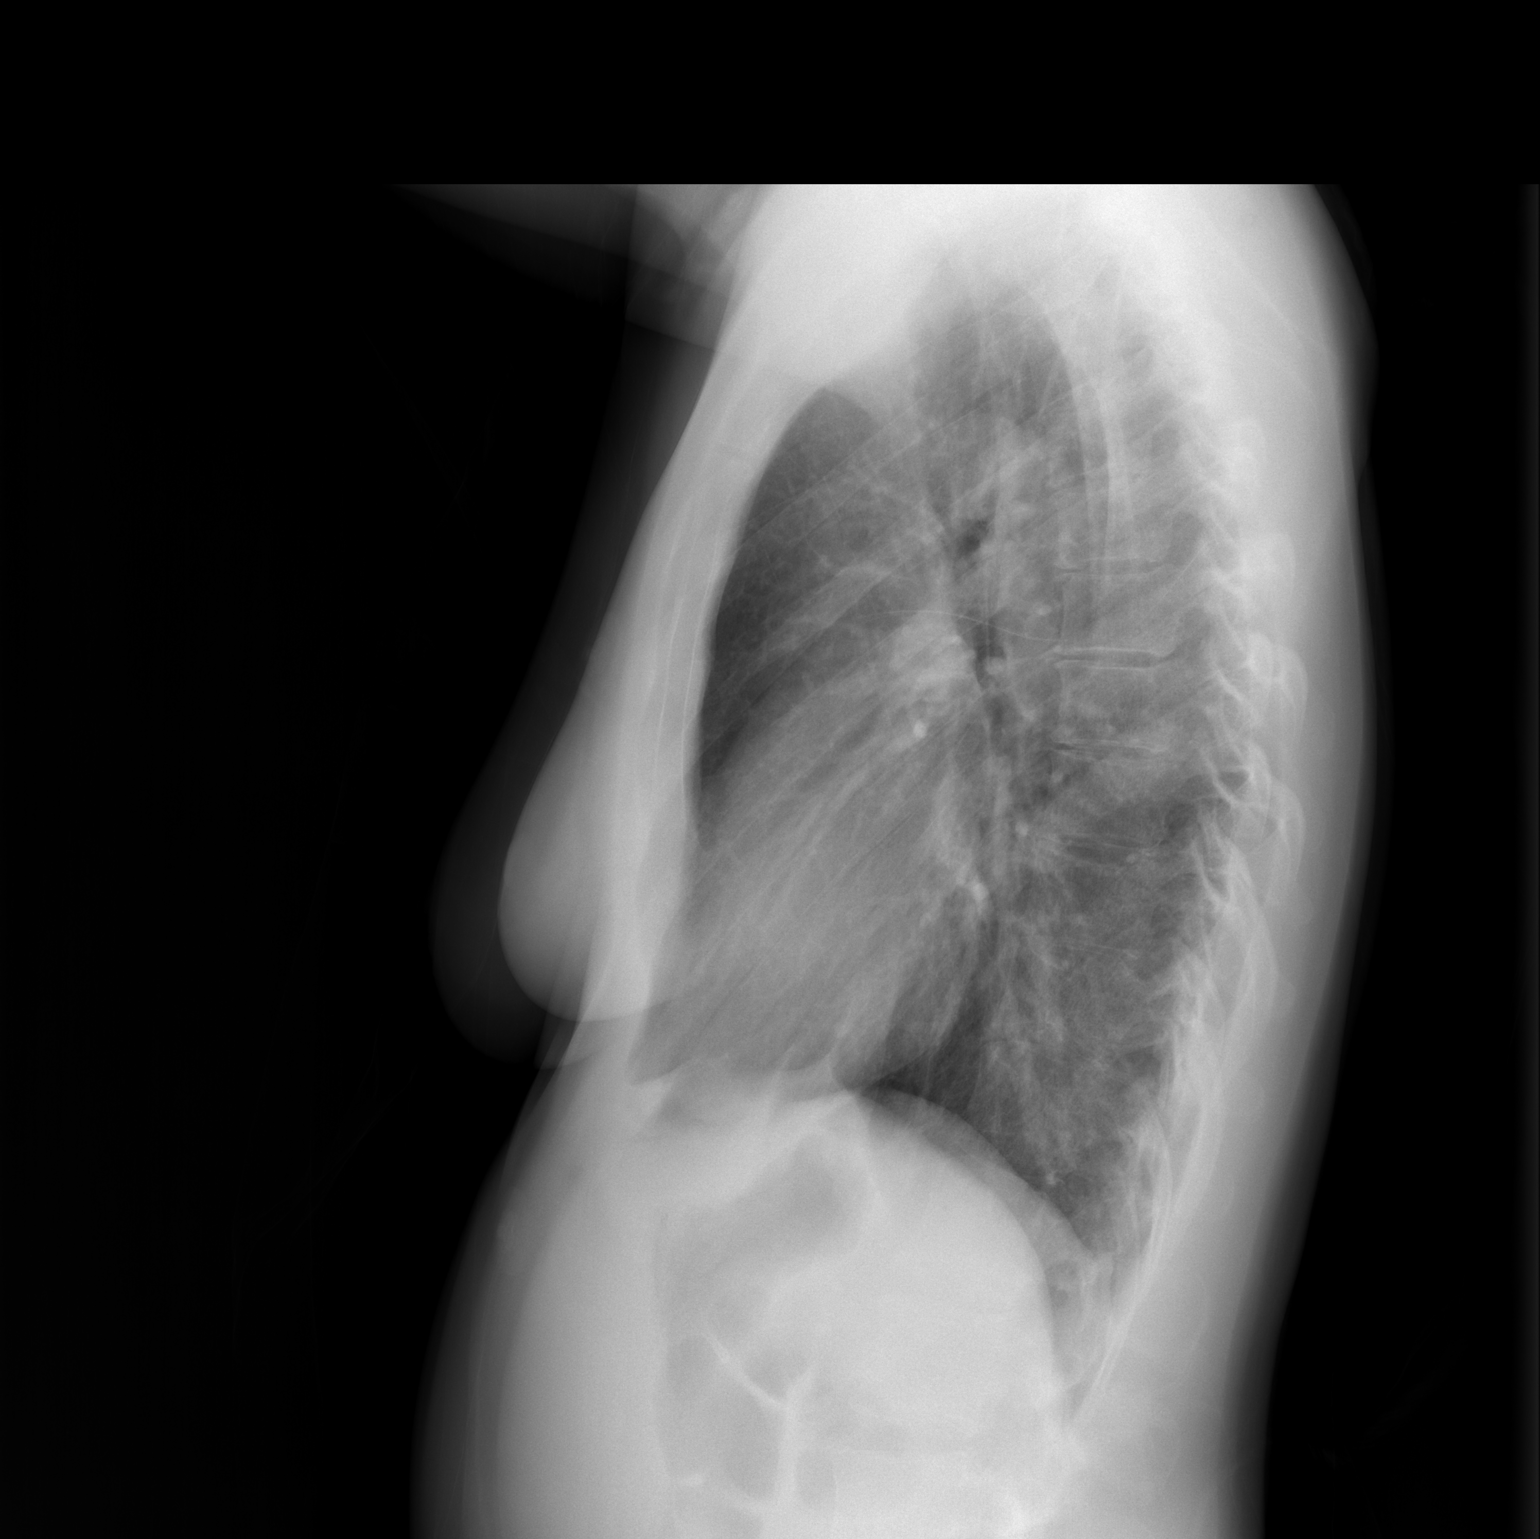

[2 of 2 positions shown; findings below may reference images not displayed]

FINDINGS: The heart size and mediastinal contours are within
normal limits.  Both lungs are clear.  The visualized skeletal
structures are unremarkable.
IMPRESSION: No active cardiopulmonary disease.

## 2008-10-22 LAB — CONVERTED CEMR LAB
Cholesterol: 178 mg/dL
HDL: 63 mg/dL
LDL Cholesterol: 102 mg/dL
Triglycerides: 67 mg/dL

## 2008-10-25 ENCOUNTER — Telehealth (INDEPENDENT_AMBULATORY_CARE_PROVIDER_SITE_OTHER): Payer: Self-pay | Admitting: Radiology

## 2008-10-26 ENCOUNTER — Telehealth (INDEPENDENT_AMBULATORY_CARE_PROVIDER_SITE_OTHER): Payer: Self-pay

## 2008-10-27 ENCOUNTER — Encounter: Payer: Self-pay | Admitting: Cardiovascular Disease

## 2008-10-27 ENCOUNTER — Ambulatory Visit: Payer: Self-pay

## 2008-10-29 ENCOUNTER — Ambulatory Visit: Payer: Self-pay | Admitting: Family Medicine

## 2008-10-29 DIAGNOSIS — R079 Chest pain, unspecified: Secondary | ICD-10-CM | POA: Insufficient documentation

## 2008-11-02 ENCOUNTER — Encounter: Admission: RE | Admit: 2008-11-02 | Discharge: 2008-11-02 | Payer: Self-pay | Admitting: Obstetrics & Gynecology

## 2008-11-02 IMAGING — MG MM SCREEN MAMMOGRAM BILATERAL
4 series · 4 of 4 positions shown · non-contrast
Comparison: none

DG SCREEN MAMMOGRAM BILATERAL
Bilateral CC and MLO view(s) were taken.

DIGITAL SCREENING MAMMOGRAM WITH CAD:
The breast tissue is heterogeneously dense.  No masses or malignant type calcifications are 
identified.  Compared with prior studies.
Images were processed with CAD.

[R CC]
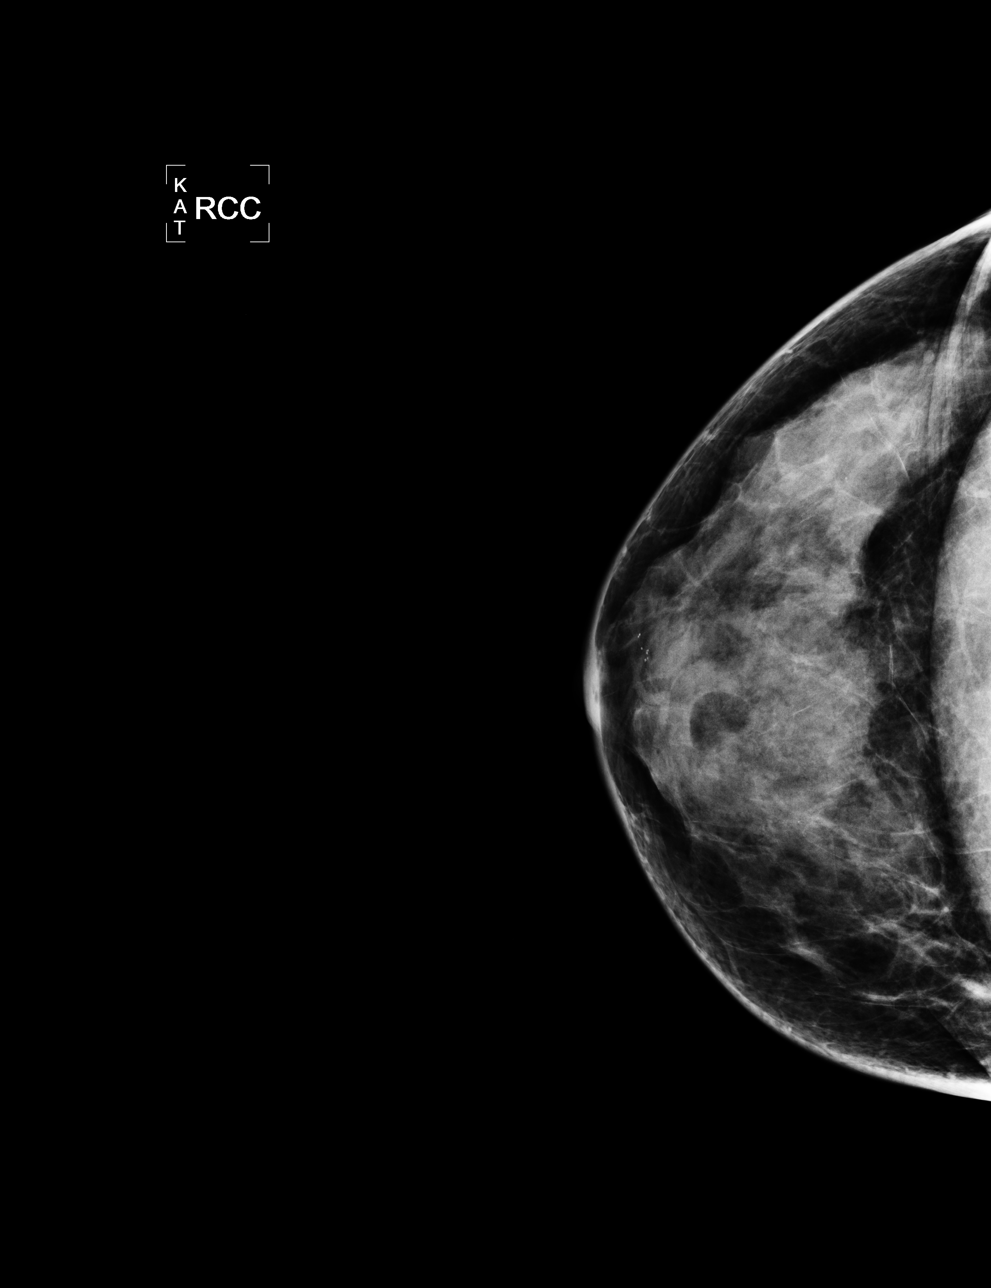

[L CC]
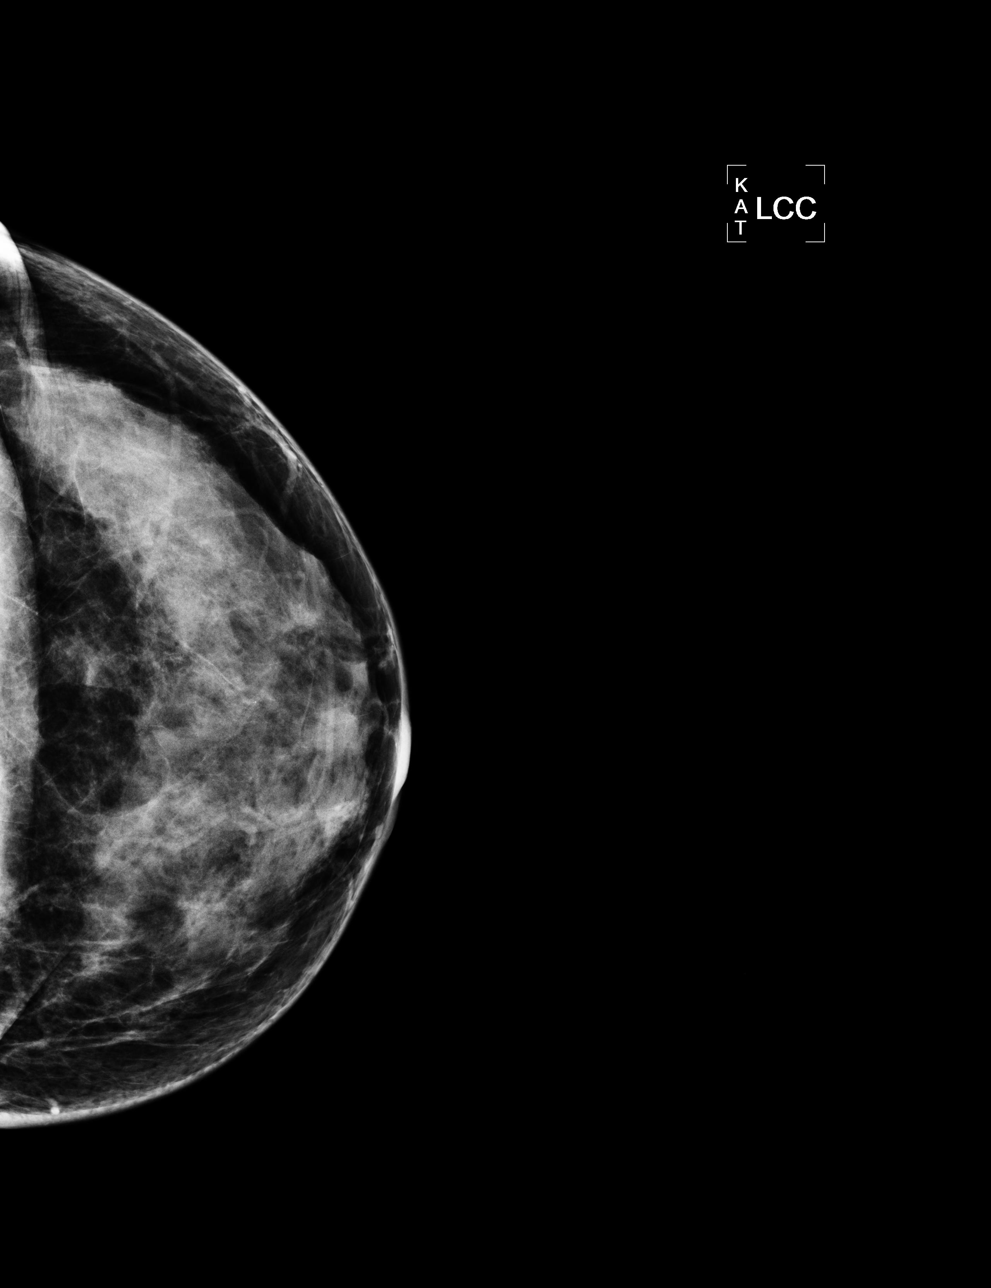

[L MLO]
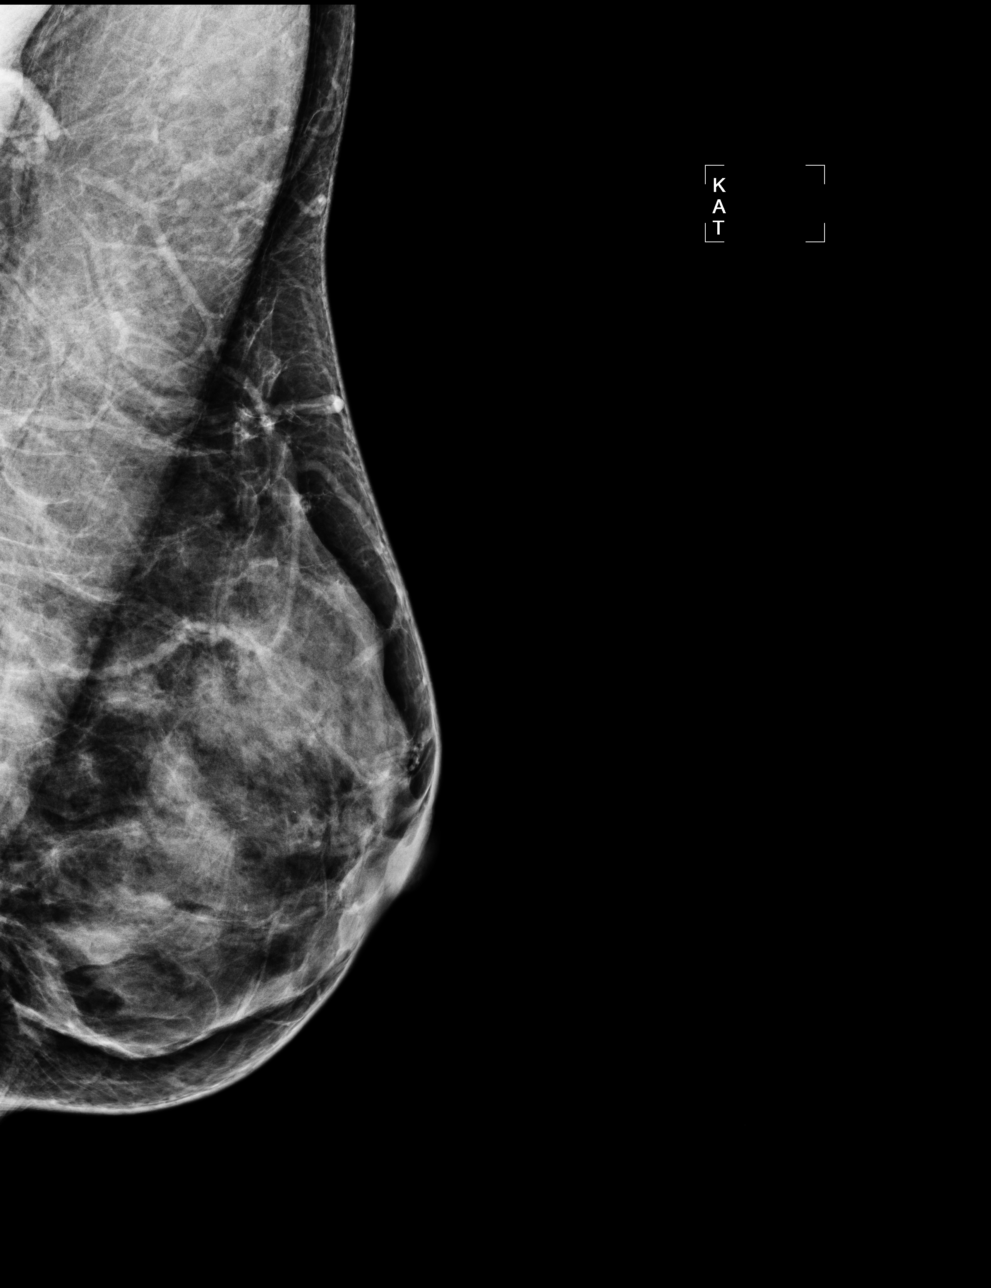

[R MLO]
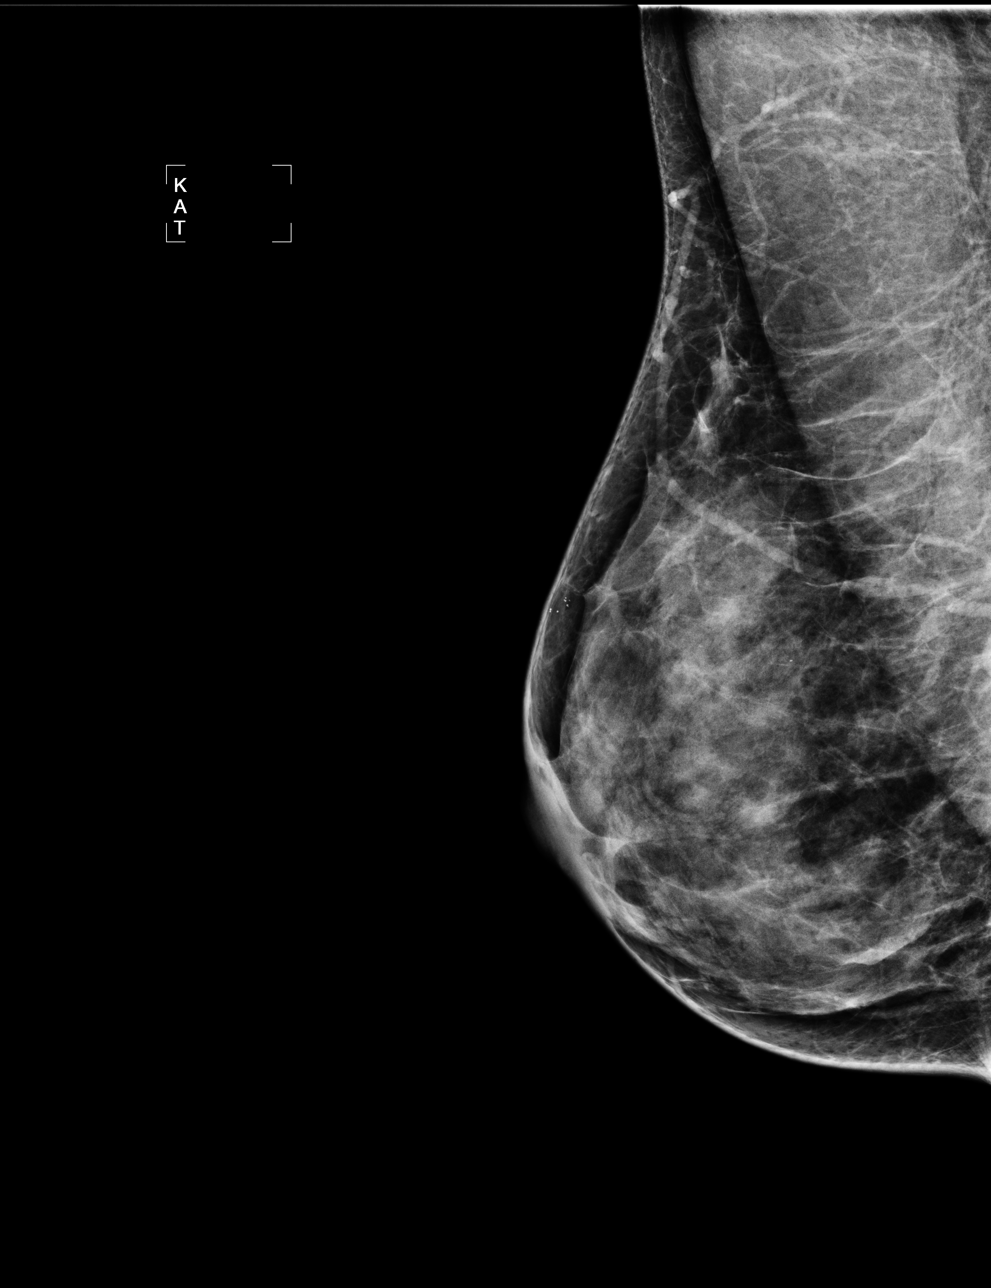

[4 of 4 positions shown; findings below may reference images not displayed]

IMPRESSION: No specific mammographic evidence of malignancy.  Next screening mammogram is recommended in one 
year.

A result letter of this screening mammogram will be mailed directly to the patient.

ASSESSMENT: Negative - BI-RADS 1

Screening mammogram in 1 year.
,

## 2009-04-15 LAB — CONVERTED CEMR LAB
Cholesterol: 212 mg/dL
Glucose, Bld: 98 mg/dL
HDL: 77 mg/dL
LDL Cholesterol: 123 mg/dL
TSH: 2.35 microintl units/mL
Triglycerides: 59 mg/dL

## 2009-05-09 LAB — CONVERTED CEMR LAB

## 2009-05-18 ENCOUNTER — Ambulatory Visit: Payer: Self-pay | Admitting: Family Medicine

## 2009-05-18 DIAGNOSIS — R5381 Other malaise: Secondary | ICD-10-CM | POA: Insufficient documentation

## 2009-05-18 DIAGNOSIS — R5383 Other fatigue: Secondary | ICD-10-CM

## 2009-05-18 LAB — CONVERTED CEMR LAB
ALT: 19 units/L (ref 0–35)
AST: 22 units/L (ref 0–37)
Albumin: 4.5 g/dL (ref 3.5–5.2)
Alkaline Phosphatase: 52 units/L (ref 39–117)
BUN: 14 mg/dL (ref 6–23)
CO2: 26 meq/L (ref 19–32)
Calcium: 9.5 mg/dL (ref 8.4–10.5)
Chloride: 106 meq/L (ref 96–112)
Creatinine, Ser: 0.84 mg/dL (ref 0.40–1.20)
Glucose, Bld: 79 mg/dL (ref 70–99)
HCT: 42.2 % (ref 36.0–46.0)
Hemoglobin: 13.8 g/dL (ref 12.0–15.0)
MCHC: 32.7 g/dL (ref 30.0–36.0)
MCV: 93 fL (ref 78.0–100.0)
Platelets: 263 10*3/uL (ref 150–400)
Potassium: 3.9 meq/L (ref 3.5–5.3)
RBC: 4.54 M/uL (ref 3.87–5.11)
RDW: 13.4 % (ref 11.5–15.5)
Sodium: 140 meq/L (ref 135–145)
Total Bilirubin: 0.5 mg/dL (ref 0.3–1.2)
Total Protein: 6.9 g/dL (ref 6.0–8.3)
Vit D, 25-Hydroxy: 32 ng/mL (ref 30–89)
WBC: 7.2 10*3/uL (ref 4.0–10.5)

## 2009-11-11 ENCOUNTER — Encounter: Admission: RE | Admit: 2009-11-11 | Discharge: 2009-11-11 | Payer: Self-pay | Admitting: Obstetrics & Gynecology

## 2009-11-11 IMAGING — MG MM DIGITAL SCREENING
4 series · 4 of 4 positions shown · non-contrast
Comparison: none

DG SCREEN MAMMOGRAM BILATERAL
Bilateral CC and MLO view(s) were taken.
Technologist: BRUGGEMAN

DIGITAL SCREENING MAMMOGRAM WITH CAD:
The breast tissue is heterogeneously dense.  No masses or malignant type calcifications are 
identified.  Compared with prior studies.
Images were processed with CAD.

[R CC]
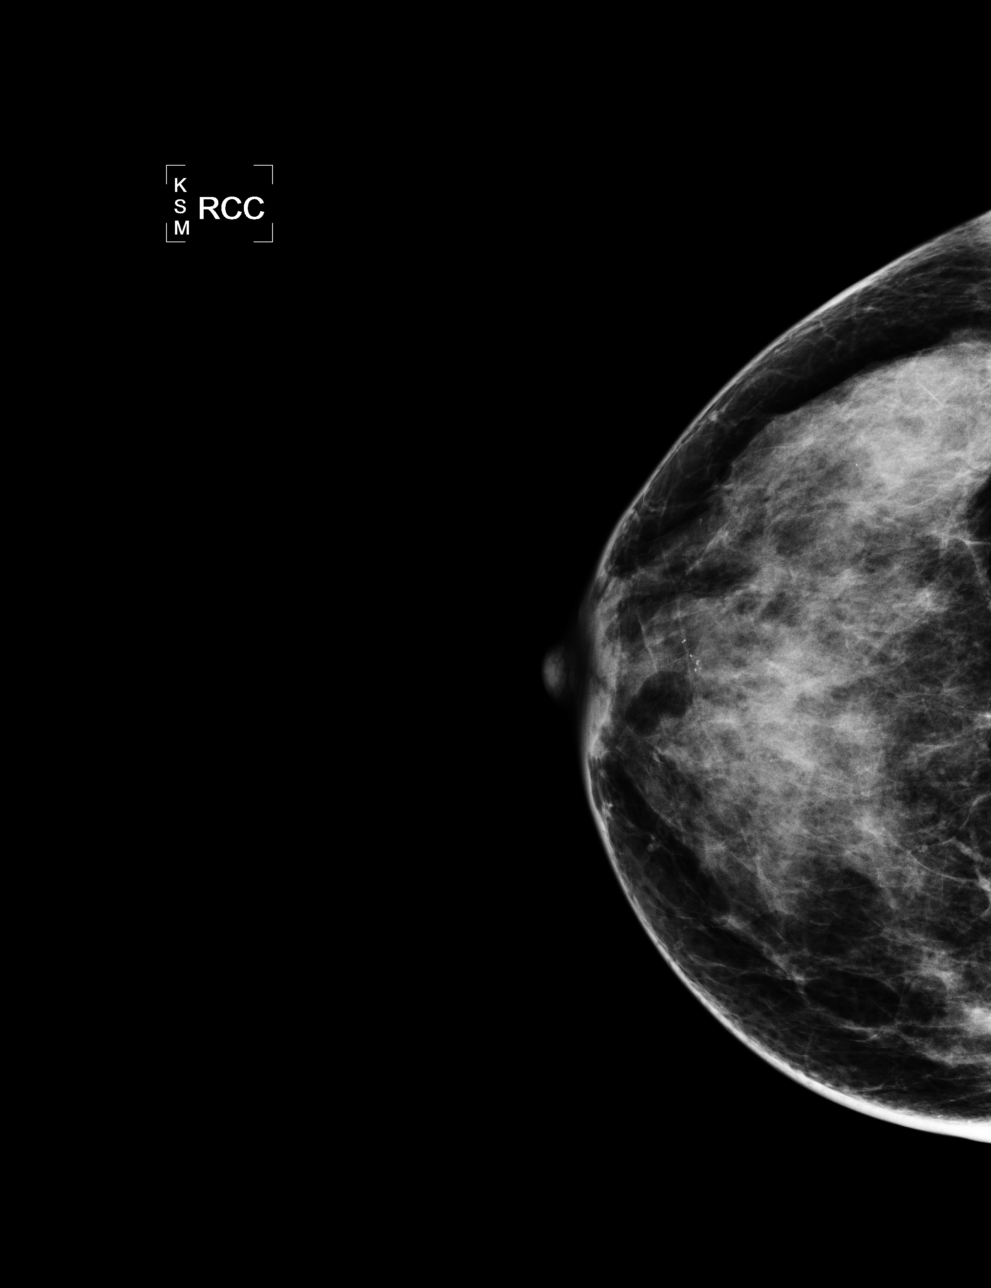

[L CC]
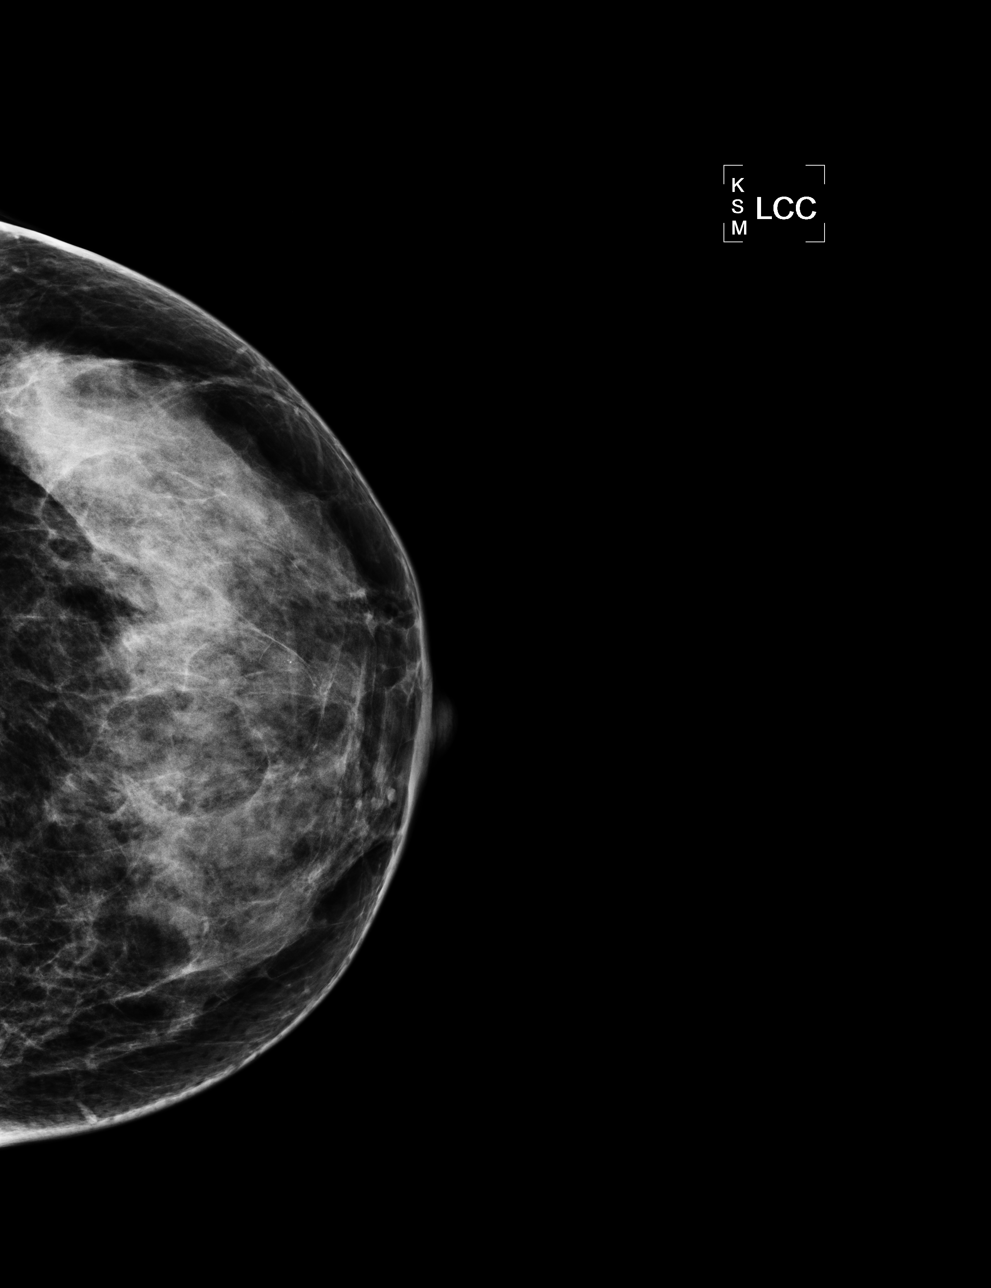

[L MLO]
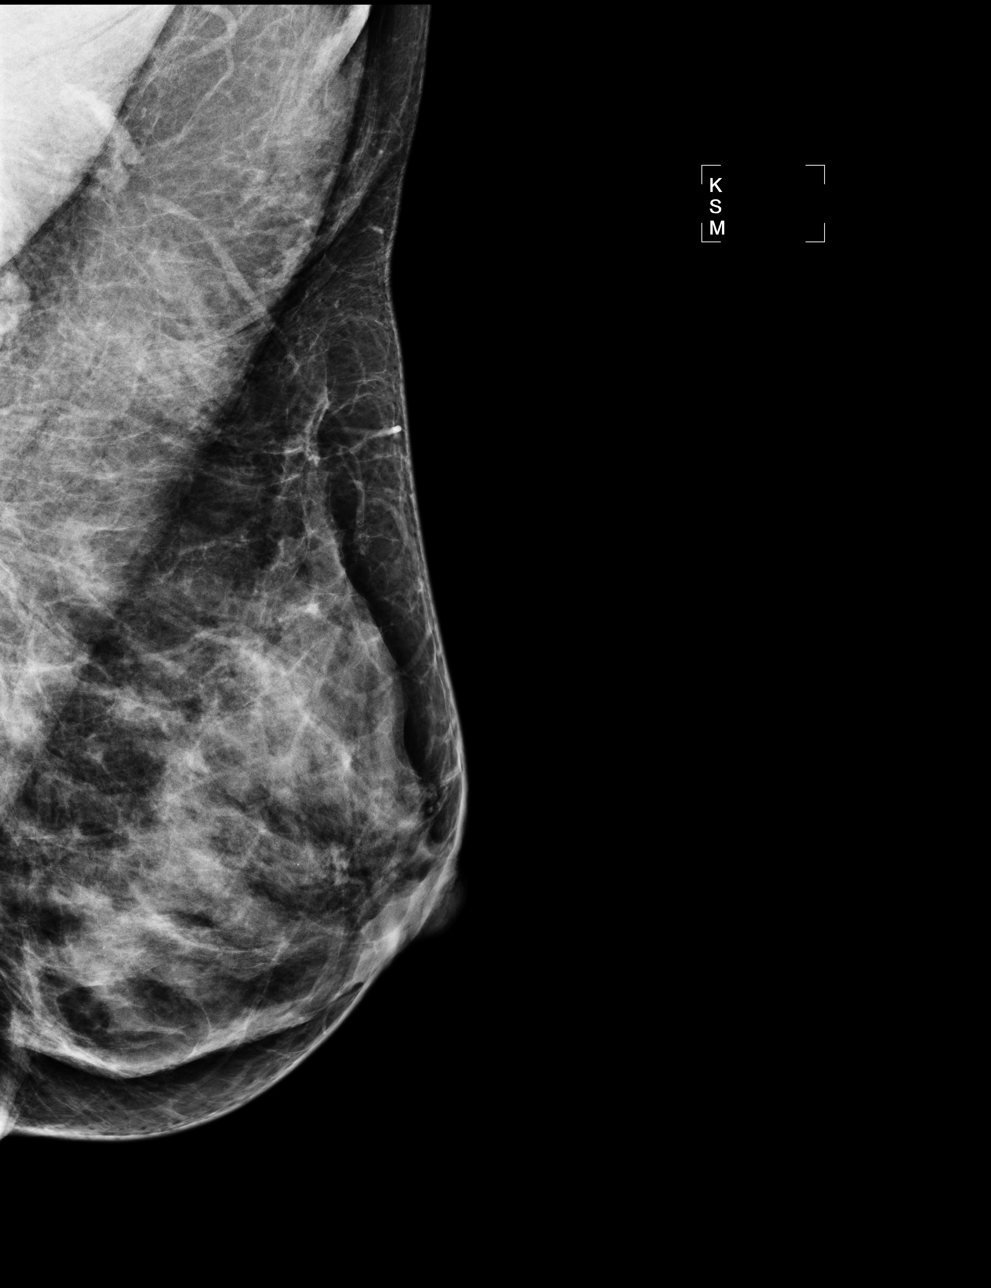

[R MLO]
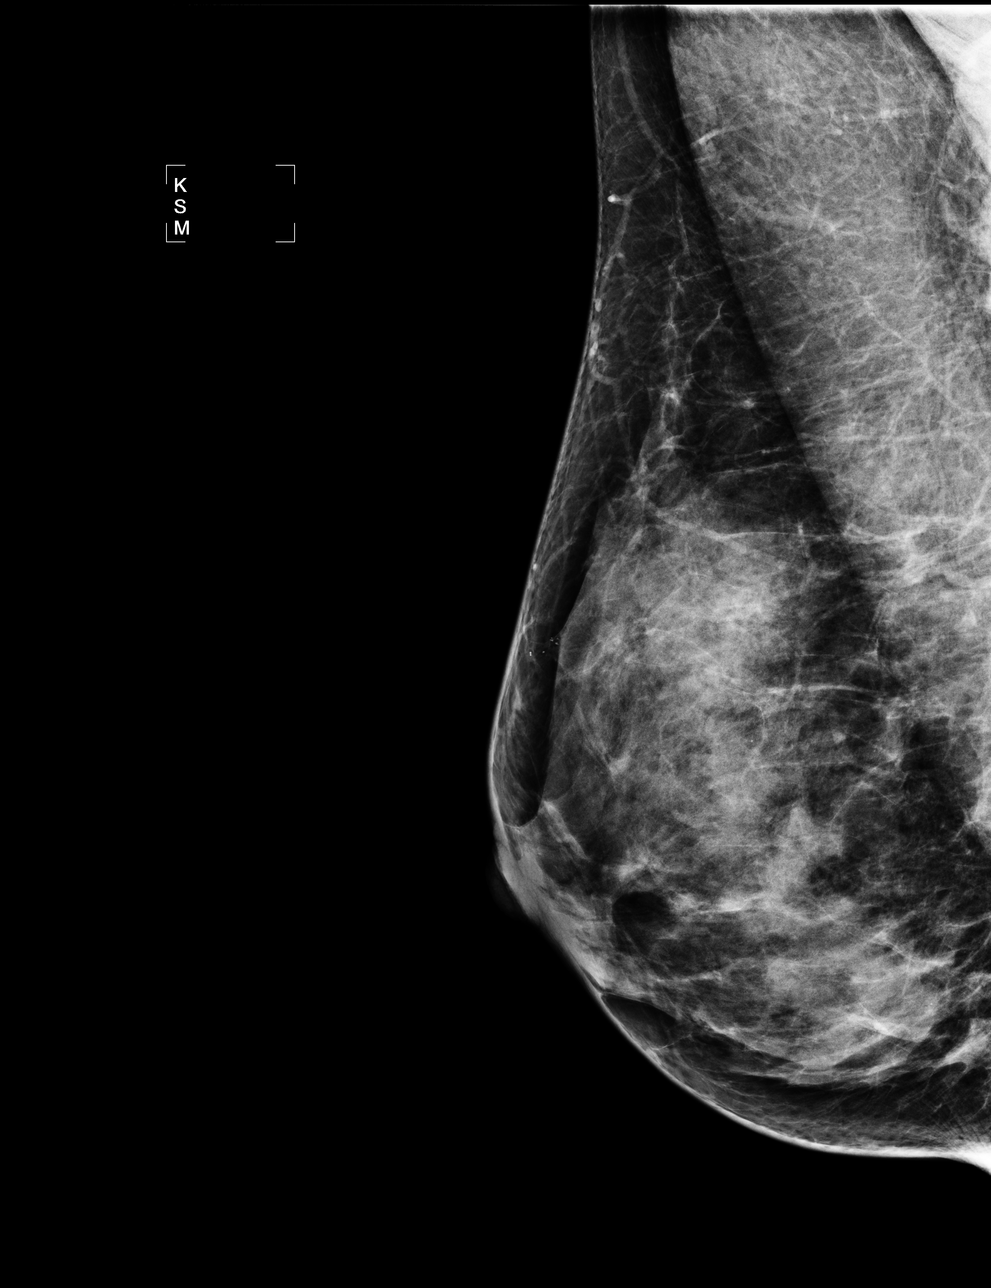

[4 of 4 positions shown; findings below may reference images not displayed]

IMPRESSION: No specific mammographic evidence of malignancy.  Next screening mammogram is recommended in one 
year.

A result letter of this screening mammogram will be mailed directly to the patient.

ASSESSMENT: Negative - BI-RADS 1

Screening mammogram in 1 year.
,

## 2010-03-07 NOTE — Progress Notes (Signed)
Summary: Nuc. Pre-Procedure  Phone Note Outgoing Call Call back at [facility] Phone [phone]   Call placed by: Irean Hong, RN,  October 26, 2008 9:12 AM Summary of Call: Reviewed information on Myoview Information Sheet (see scanned document for further details).  Spoke with patient.     Nuclear Med Background Indications for Stress Test: Evaluation for Ischemia, Post Hospital  Indications Comments: 9/16/10[facility] ER- Chest Pain- Negative Enzymes.    Symptoms: Chest Pain    Nuclear Pre-Procedure Cardiac Risk Factors: Family History - CAD
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
## 2010-03-07 NOTE — Progress Notes (Signed)
Summary: Nuc Pre-Procedure  Phone Note Outgoing Call Call back at [facility] Phone [phone]   Call placed by: Leonia Corona, RT-N,  October 25, 2008 3:49 PM Call placed to: Patient Reason for Call: Confirm/change Appt Summary of Call: Left message with information on Myoview Information Sheet (see scanned document for details).      Nuclear Med Background Indications for Stress Test: Evaluation for Ischemia, Post Hospital  Indications Comments: 9/16/10[facility] ER- Chest Pain- Negative Enzymes.    Symptoms: Chest Pain    Nuclear Pre-Procedure Cardiac Risk Factors: Family History - CAD   Appended Document: Nuc Pre-Procedure The patient left a message on our voicemail this am to cancel the myoview today due to the fact that she was unaware of the appointment. The patient has an appointment on 10/29/08 to see her primary care physician to get his opinion on her plan of care, and then she wil call us back if needed per patient.

## 2010-03-07 NOTE — Assessment & Plan Note (Signed)
Summary: Cardiology Nuclear Study  Nuclear Med Background Indications for Stress Test: Evaluation for Ischemia, Post Hospital  Indications Comments: 9/16/10[facility] ER- Chest Pain- Negative Enzymes.  History: Asthma  History Comments: No documented CAD; h/o Asthma as child  Symptoms: Chest Pain    Nuclear Pre-Procedure Cardiac Risk Factors: Family History - CAD Caffeine/Decaff Intake: none NPO After: 8:30 PM Lungs: Cllear IV 0.9% NS with Angio Cath: 22g     IV Site: (L) AC IV Started by: Irean Hong RN Chest Size (in) 34     Cup Size B     Height (in): 65 Weight (lb): 127 BMI: 21.21  Nuclear Med Study 1 or 2 day study:  1 day     Stress Test Type:  Stress Reading MD:  Charlton Haws, MD     Referring MD:  Marca Ancona, MD Resting Radionuclide:  Technetium 2m Tetrofosmin     Resting Radionuclide Dose:  11 mCi  Stress Radionuclide:  Technetium 96m Tetrofosmin     Stress Radionuclide Dose:  33 mCi   Stress Protocol Exercise Time (min):  13:00 min     Max HR:  173 bpm     Predicted Max HR: 175 bpm  Max Systolic BP: 150 mm Hg     % Max HR:  98 %     METS: 15.3 Rate Pressure Product:  96295    Stress Test Technologist:  Rea [employer] CMA-N     Nuclear Technologist:  Domenic Polite CNMT  Rest Procedure  Myocardial perfusion imaging was performed at rest 45 minutes following the intraveneous administration of Myoview Technetium 62m Tetrofosmin.  Stress Procedure  The patient exercised for thirteen minutes.  The patient stopped due to fatigue and denied any chest pain.  There were no significant ST-T wave changes, only rare PVC' and PAC's.  Myoview was injected at peak exercise and myocardial perfusion imaging was performed after a brief delay.  QPS Raw Data Images:  Normal; no motion artifact; normal heart/lung ratio. Stress Images:  NI: Uniform and normal uptake of tracer in all myocardial segments. Rest Images:  Normal homogeneous uptake in all areas of the  myocardium. Subtraction (SDS):  Normal Transient Ischemic Dilatation:  .97  (Normal <1.22)  Lung/Heart Ratio:  .33  (Normal <0.45)  Quantitative Gated Spect Images QGS EDV:  73 ml QGS ESV:  28 ml QGS EF:  62 % QGS cine images:  Normal  Findings Normal nuclear study      Overall Impression  Exercise Capacity: Excellent exercise capacity. BP Response: Normal blood pressure response. Clinical Symptoms: No chest pain ECG Impression: No significant ST segment change suggestive of ischemia. Overall Impression: Normal stress nuclear study. Overall Impression Comments: Normal  Appended Document: Cardiology Nuclear Study normal study  Appended Document: Cardiology Nuclear Study LMVM  Appended Document: Cardiology Nuclear Study pt given results

## 2010-04-10 ENCOUNTER — Other Ambulatory Visit: Payer: Self-pay | Admitting: Obstetrics & Gynecology

## 2010-05-12 LAB — CARDIAC PANEL(CRET KIN+CKTOT+MB+TROPI)
CK, MB: 0.6 ng/mL (ref 0.3–4.0)
CK, MB: 0.8 ng/mL (ref 0.3–4.0)
Relative Index: INVALID (ref 0.0–2.5)
Relative Index: INVALID (ref 0.0–2.5)
Total CK: 50 U/L (ref 7–177)
Total CK: 50 U/L (ref 7–177)
Troponin I: 0.01 ng/mL (ref 0.00–0.06)
Troponin I: 0.02 ng/mL (ref 0.00–0.06)

## 2010-05-12 LAB — URINALYSIS, ROUTINE W REFLEX MICROSCOPIC
Bilirubin Urine: NEGATIVE
Glucose, UA: NEGATIVE mg/dL
Hgb urine dipstick: NEGATIVE
Ketones, ur: NEGATIVE mg/dL
Nitrite: NEGATIVE
Protein, ur: NEGATIVE mg/dL
Specific Gravity, Urine: 1.017 (ref 1.005–1.030)
Urobilinogen, UA: 0.2 mg/dL (ref 0.0–1.0)
pH: 5 (ref 5.0–8.0)

## 2010-05-12 LAB — COMPREHENSIVE METABOLIC PANEL
ALT: 15 U/L (ref 0–35)
AST: 19 U/L (ref 0–37)
Albumin: 3.6 g/dL (ref 3.5–5.2)
Alkaline Phosphatase: 61 U/L (ref 39–117)
BUN: 13 mg/dL (ref 6–23)
CO2: 28 mEq/L (ref 19–32)
Calcium: 8.9 mg/dL (ref 8.4–10.5)
Chloride: 106 mEq/L (ref 96–112)
Creatinine, Ser: 0.92 mg/dL (ref 0.4–1.2)
GFR calc Af Amer: >60 mL/min (ref >60)
GFR calc non Af Amer: >60 mL/min (ref >60)
Glucose, Bld: 99 mg/dL (ref 70–99)
Potassium: 4 mEq/L (ref 3.5–5.1)
Sodium: 140 mEq/L (ref 135–145)
Total Bilirubin: 1 mg/dL (ref 0.3–1.2)
Total Protein: 6.2 g/dL (ref 6.0–8.3)

## 2010-05-12 LAB — BASIC METABOLIC PANEL
BUN: 16 mg/dL (ref 6–23)
CO2: 27 mEq/L (ref 19–32)
Calcium: 9 mg/dL (ref 8.4–10.5)
Chloride: 108 mEq/L (ref 96–112)
Creatinine, Ser: 0.8 mg/dL (ref 0.4–1.2)
GFR calc Af Amer: >60 mL/min (ref >60)
GFR calc non Af Amer: >60 mL/min (ref >60)
Glucose, Bld: 92 mg/dL (ref 70–99)
Potassium: 4.2 mEq/L (ref 3.5–5.1)
Sodium: 139 mEq/L (ref 135–145)

## 2010-05-12 LAB — CBC
HCT: 39.6 % (ref 36.0–46.0)
Hemoglobin: 13.5 g/dL (ref 12.0–15.0)
MCHC: 34.1 g/dL (ref 30.0–36.0)
MCV: 92.4 fL (ref 78.0–100.0)
Platelets: 261 10*3/uL (ref 150–400)
RBC: 4.29 MIL/uL (ref 3.87–5.11)
RDW: 13.4 % (ref 11.5–15.5)
WBC: 6.8 10*3/uL (ref 4.0–10.5)

## 2010-05-12 LAB — RAPID URINE DRUG SCREEN, HOSP PERFORMED
Amphetamines: NOT DETECTED
Barbiturates: NOT DETECTED
Benzodiazepines: NOT DETECTED
Cocaine: NOT DETECTED
Opiates: NOT DETECTED
Tetrahydrocannabinol: NOT DETECTED

## 2010-05-12 LAB — POCT CARDIAC MARKERS
CKMB, poc: <1 ng/mL — ABNORMAL LOW (ref 1.0–8.0)
CKMB, poc: <1 ng/mL — ABNORMAL LOW (ref 1.0–8.0)
Myoglobin, poc: 28.8 ng/mL (ref 12–200)
Myoglobin, poc: 34.4 ng/mL (ref 12–200)
Troponin i, poc: <0.05 ng/mL (ref 0.00–0.09)
Troponin i, poc: <0.05 ng/mL (ref 0.00–0.09)

## 2010-05-12 LAB — TSH: TSH: 2.109 u[IU]/mL (ref 0.350–4.500)

## 2010-05-12 LAB — LIPID PANEL
Cholesterol: 178 mg/dL (ref 0–200)
HDL: 63 mg/dL (ref >39)
LDL Cholesterol: 102 mg/dL — ABNORMAL HIGH (ref 0–99)
Total CHOL/HDL Ratio: 2.8 RATIO
Triglycerides: 67 mg/dL (ref <150)
VLDL: 13 mg/dL (ref 0–40)

## 2010-05-12 LAB — POCT PREGNANCY, URINE: Preg Test, Ur: NEGATIVE

## 2010-05-12 LAB — DIFFERENTIAL
Basophils Absolute: 0 10*3/uL (ref 0.0–0.1)
Basophils Relative: 1 % (ref 0–1)
Eosinophils Absolute: 0 10*3/uL (ref 0.0–0.7)
Eosinophils Relative: 0 % (ref 0–5)
Lymphocytes Relative: 25 % (ref 12–46)
Lymphs Abs: 1.7 10*3/uL (ref 0.7–4.0)
Monocytes Absolute: 0.5 10*3/uL (ref 0.1–1.0)
Monocytes Relative: 7 % (ref 3–12)
Neutro Abs: 4.5 10*3/uL (ref 1.7–7.7)
Neutrophils Relative %: 67 % (ref 43–77)

## 2010-05-12 LAB — APTT: aPTT: 42 seconds — ABNORMAL HIGH (ref 24–37)

## 2010-05-12 LAB — PROTIME-INR
INR: 1 (ref 0.00–1.49)
Prothrombin Time: 13.3 seconds (ref 11.6–15.2)

## 2010-05-12 LAB — PHOSPHORUS: Phosphorus: 3.7 mg/dL (ref 2.3–4.6)

## 2010-05-12 LAB — BRAIN NATRIURETIC PEPTIDE: Pro B Natriuretic peptide (BNP): <30 pg/mL (ref 0.0–100.0)

## 2010-05-12 LAB — D-DIMER, QUANTITATIVE (NOT AT ARMC): D-Dimer, Quant: 0.37 ug/mL-FEU (ref 0.00–0.48)

## 2010-05-12 LAB — MAGNESIUM: Magnesium: 2.4 mg/dL (ref 1.5–2.5)

## 2010-06-23 NOTE — Op Note (Signed)
NAME:  Kara Pittman, Kara Pittman                 ACCOUNT NO.:  [id number]   MEDICAL RECORD NO.:  [id number]          PATIENT TYPE:  AMB   LOCATION:  SDC                           FACILITY:  [facility]   PHYSICIAN:  Genia Del, M.D.DATE OF BIRTH:  December 19, 1965   DATE OF PROCEDURE:  05/09/2006  DATE OF DISCHARGE:                               OPERATIVE REPORT   PREOPERATIVE DIAGNOSIS:  1. 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  2. Vaginal relaxation with intercourse.   POSTOPERATIVE DIAGNOSES:  1. 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  2. Vaginal relaxation with intercourse.   PROCEDURE:  Total vaginal hysterectomy with perineoplasty.   SURGEON:  Genia Del, M.D.   ASSISTANT:  Gerri Spore B. Earlene Plater, M.D.   ANESTHESIOLOGIST:  Quillian Quince, M.D.   PROCEDURE:  Under general anesthesia with endotracheal intubation, the  patient is in lithotomy position.  She is prepped with Betadine on the  abdominal, suprapubic, vulvar and vaginal areas and draped as usual.  The bladder catheter is put in place.  The vaginal exam reveals a normal-  sized uterus, retroverted.  No adnexal mass.  There is uterine prolapse  at about 1/3 and vaginal relaxation allowing about four fingers in the  vagina.  We put the weighted speculum in place.  We then grasped the  cervix with tenacula.  We infiltrate circumferentially the vaginal  mucosa at the junction of the cervix with lidocaine and epinephrine.  We  then make a circumferential incision at that level with the  electrocautery.  We retract the vaginal mucosa anteriorly and  posteriorly, open the visceral peritoneum anteriorly and posteriorly  with [employer].  We reposition the retractor anteriorly to remove  the bladder from the surgical field and we put the long weighted  speculum posteriorly to keep the rectum away from the surgical field.  We then use a curved Heaney to clamp the left cardinal ligament,  resection with Mayo scissors, and suture with a Heaney stitch of Vicryl  0.  We do the same thing on the right side.  We then clamp the left  uterosacral ligament.  We section with Mayo scissors and suture with a  Heaney stitch of 0 Vicryl.  We keep a vaginal hemostat.  We proceed the  same way on the right side.  We then use the LigaSure to cauterize the  uterine artery on the left side.  We section with Metz scissors.  We do  the same on the right side and we move along the uterus, clamping,  cauterizing and sectioning the utero-ovarian ligament on the right side,  then the round ligament and the tube, and the same way on the left side.  We detach the uterus completely and send it to pathology.  We complete  hemostasis at the level of the ovaries with the LigaSure.  Hemostasis is  adequate at all levels.  We then make a locked running suture of 0  Vicryl on the posterior vaginal vault.  We attempt to do a McCall but  the uterosacral ligaments are not palpable.  We  close the vagina  starting anteriorly to posteriorly with figure-of-eights with 0 Vicryl.  We included the peritoneum with those stitches.  We attach the  uterosacral ligaments on the midline when we arrive at that level.  The  vagina is completely closed.  Good hemostasis.  We then make an incision  at the perineum with the scalpel.  We make a V incision and the vagina  and a V incision on the perineum to remove the excess skin.  We then use  2-0 Vicryl to tighten the transverse perineal muscle and rebuild the  perineal body with figure-of-eights.  The introitus of the vagina is now  two fingerbreadths.  We therefore finish by closing the skin with a  subcuticular stitch of 4-0 Vicryl.  Hemostasis is  adequate.  The count of sponges and instruments was complete x2.  The  estimated blood loss was 100 mL total.  No complication occurred, and  the patient was transferred to recovery room in good, stable status.  A  dose of Ancef  was given at the time of induction.      Genia Del, M.D.  Electronically Signed     ML/MEDQ  D:  05/09/2006  T:  05/09/2006  Job:  [id number]

## 2010-10-25 ENCOUNTER — Other Ambulatory Visit: Payer: Self-pay | Admitting: Obstetrics & Gynecology

## 2010-10-25 DIAGNOSIS — Z1231 Encounter for screening mammogram for malignant neoplasm of breast: Secondary | ICD-10-CM

## 2010-11-17 ENCOUNTER — Ambulatory Visit
Admission: RE | Admit: 2010-11-17 | Discharge: 2010-11-17 | Disposition: A | Discharge status: Home / Self Care | Payer: PRIVATE HEALTH INSURANCE | Source: Ambulatory Visit | Attending: Obstetrics & Gynecology | Admitting: Obstetrics & Gynecology

## 2010-11-17 ENCOUNTER — Ambulatory Visit: Payer: Self-pay

## 2010-11-17 DIAGNOSIS — Z1231 Encounter for screening mammogram for malignant neoplasm of breast: Secondary | ICD-10-CM

## 2010-11-17 IMAGING — MG MM DIGITAL SCREENING {BCG}
4 series · 4 of 4 positions shown · non-contrast
Comparison: none

DG SCREEN MAMMOGRAM BILATERAL
Bilateral CC and MLO view(s) were taken.

DIGITAL SCREENING MAMMOGRAM WITH CAD:
The breast tissue is heterogeneously dense.  No masses or malignant type calcifications are 
identified.  Compared with prior studies.
Images were processed with CAD.

[R CC]
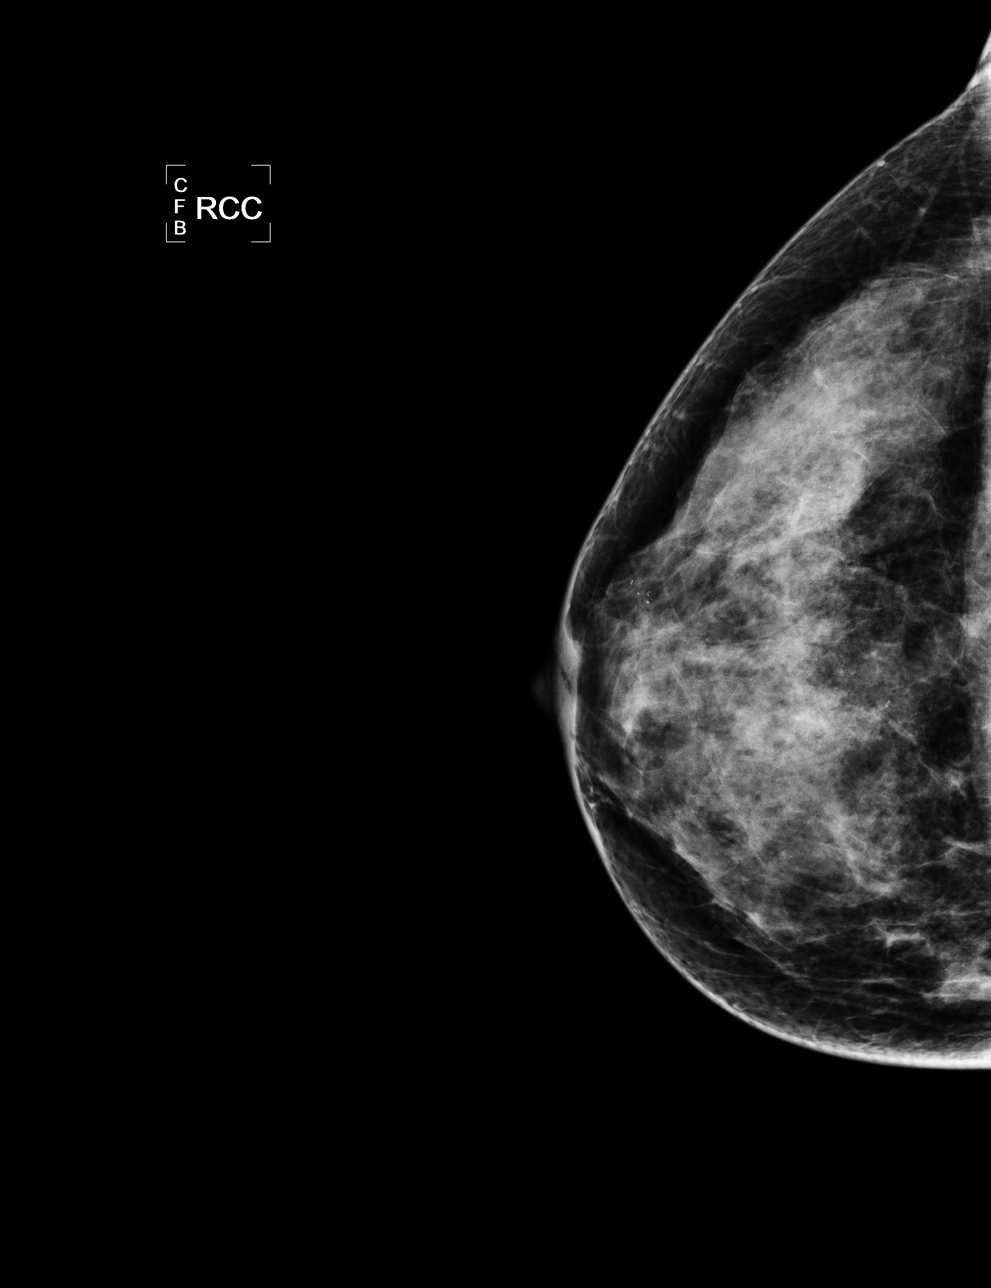

[L CC]
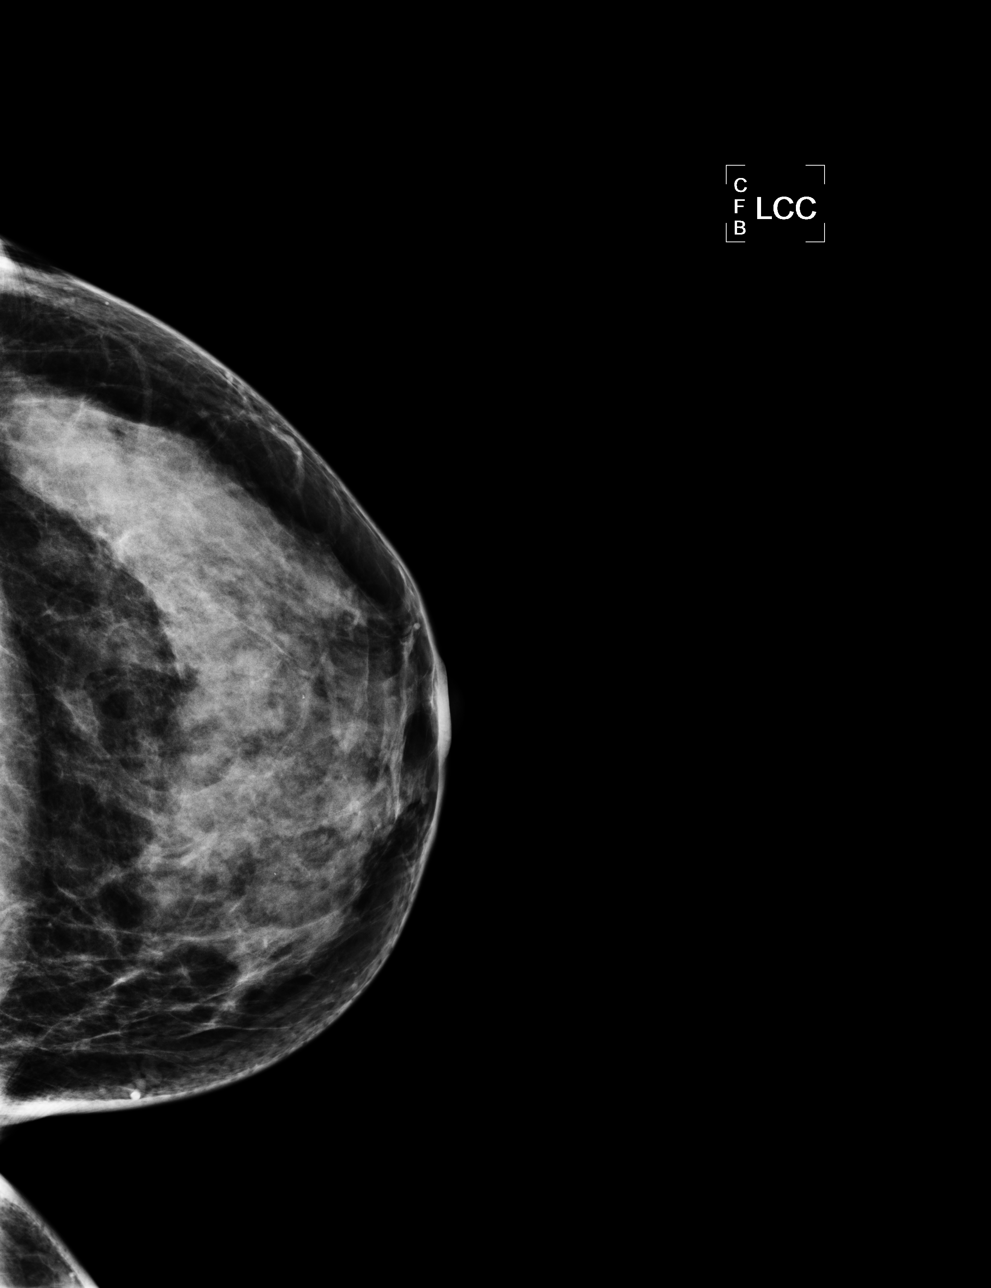

[L MLO]
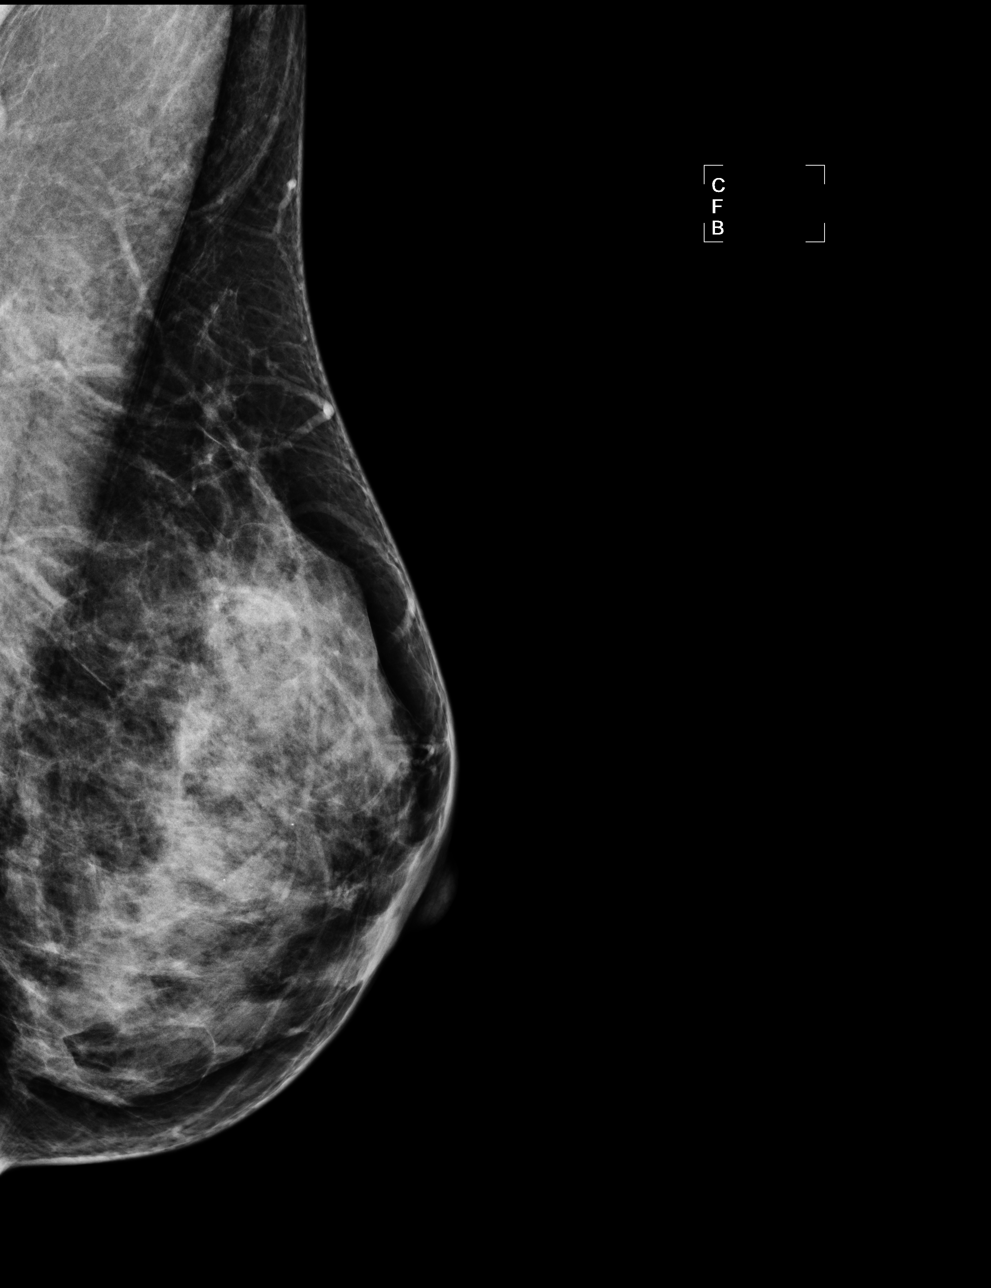

[R MLO]
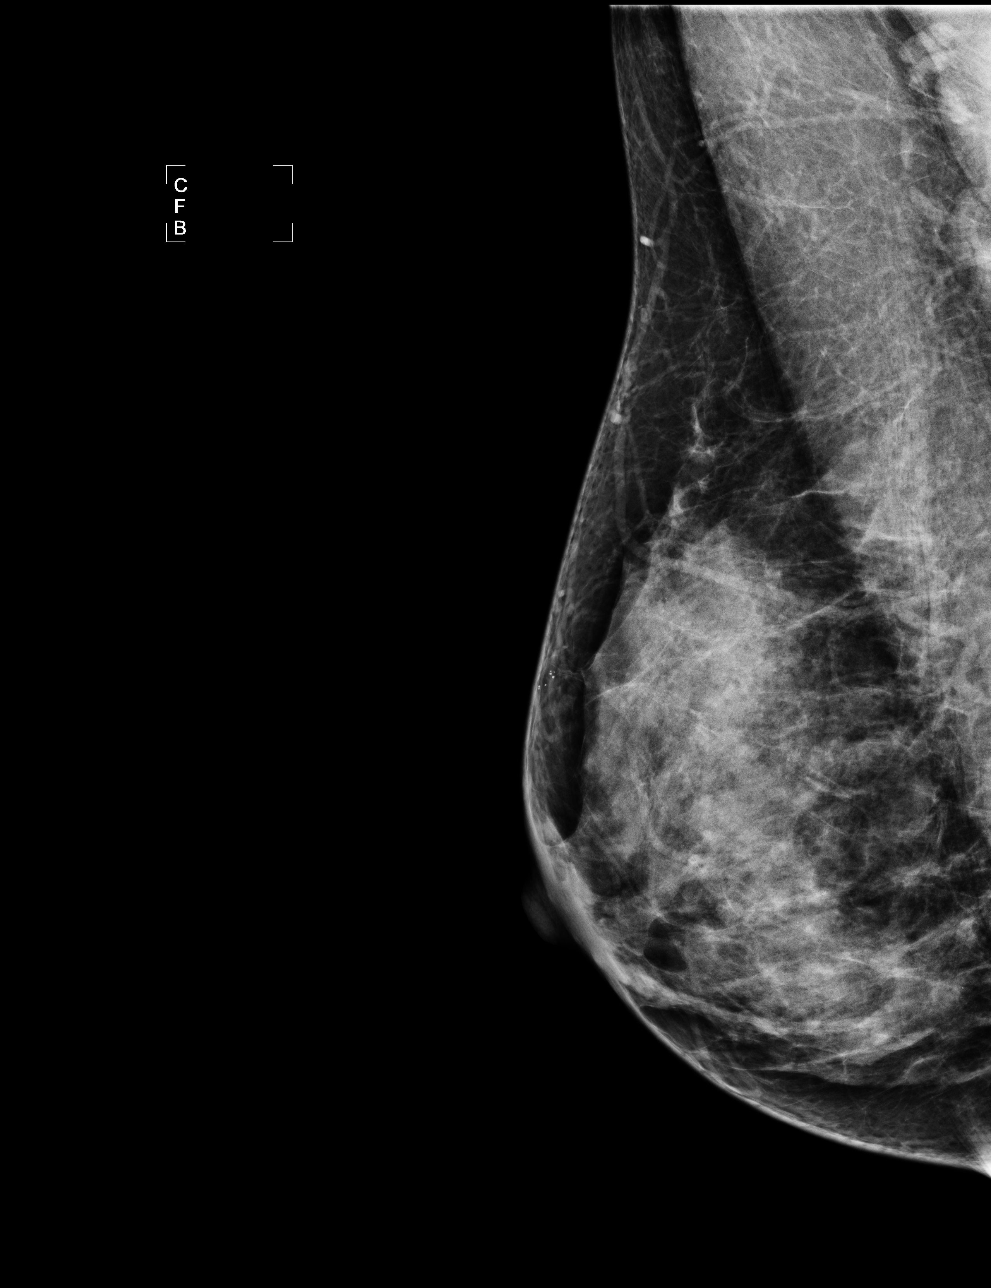

[4 of 4 positions shown; findings below may reference images not displayed]

IMPRESSION: No specific mammographic evidence of malignancy.  Next screening mammogram is recommended in one 
year.

A result letter of this screening mammogram will be mailed directly to the patient.

ASSESSMENT: Negative - BI-RADS 1

Screening mammogram in 1 year.
,

## 2011-10-09 ENCOUNTER — Other Ambulatory Visit: Payer: Self-pay | Admitting: Obstetrics & Gynecology

## 2011-10-17 ENCOUNTER — Telehealth: Payer: Self-pay | Admitting: Family Medicine

## 2011-10-17 NOTE — Telephone Encounter (Signed)
Called Kara Pittman to get more information. Kara Pittman stated that he has been her family Dr for years and she is calling because she has a sinus infection and she told me that her sx are that she is experiencing sensitivity and pain to face, headache and pressure, sore throat and drainage. At present Kara Pittman stated that she just lost her job and because she is feeling so terrible  Kara Pittman wanted to know if Dr. Leveda Anna can call something into her pharmacy [facility] on [address]. I told  Her that she will more than likely need to come in to be seen. I will forward this information to Dr. Leveda Anna.Laureen Ochs, Viann Shove

## 2011-10-17 NOTE — Telephone Encounter (Signed)
Patient is calling because she has something personal that she wants to discuss with him.

## 2011-10-18 MED ORDER — AMOXICILLIN-POT CLAVULANATE 875-125 MG PO TABS: 1.0000 | ORAL_TABLET | Freq: Two times a day (BID) | ORAL | Status: AC

## 2011-10-18 NOTE — Telephone Encounter (Signed)
Called and discussed.  Typical sinusitis symptoms which she has had in the past.  I will Rx with Augmentin.  Also told to make appointment soon that she is way overdue.  Unfortunately, lost job unexpectedly two weeks ago.  Will make appointment as soon as finances and insurance allow.

## 2011-11-02 ENCOUNTER — Other Ambulatory Visit (HOSPITAL_COMMUNITY): Payer: Self-pay | Admitting: Obstetrics & Gynecology

## 2011-11-02 DIAGNOSIS — Z1231 Encounter for screening mammogram for malignant neoplasm of breast: Secondary | ICD-10-CM

## 2011-11-19 ENCOUNTER — Ambulatory Visit (HOSPITAL_COMMUNITY)
Admission: RE | Admit: 2011-11-19 | Discharge: 2011-11-19 | Disposition: A | Discharge status: Home / Self Care | Payer: Self-pay | Source: Ambulatory Visit | Attending: Obstetrics & Gynecology | Admitting: Obstetrics & Gynecology

## 2011-11-19 DIAGNOSIS — Z1231 Encounter for screening mammogram for malignant neoplasm of breast: Secondary | ICD-10-CM

## 2011-11-19 IMAGING — MG MM DIGITAL SCREENING BILAT
4 series · 4 of 4 positions shown · non-contrast
Comparison: Previous exams.

CLINICAL DATA: Screening.

DIGITAL BILATERAL SCREENING MAMMOGRAM WITH CAD

[R CC]
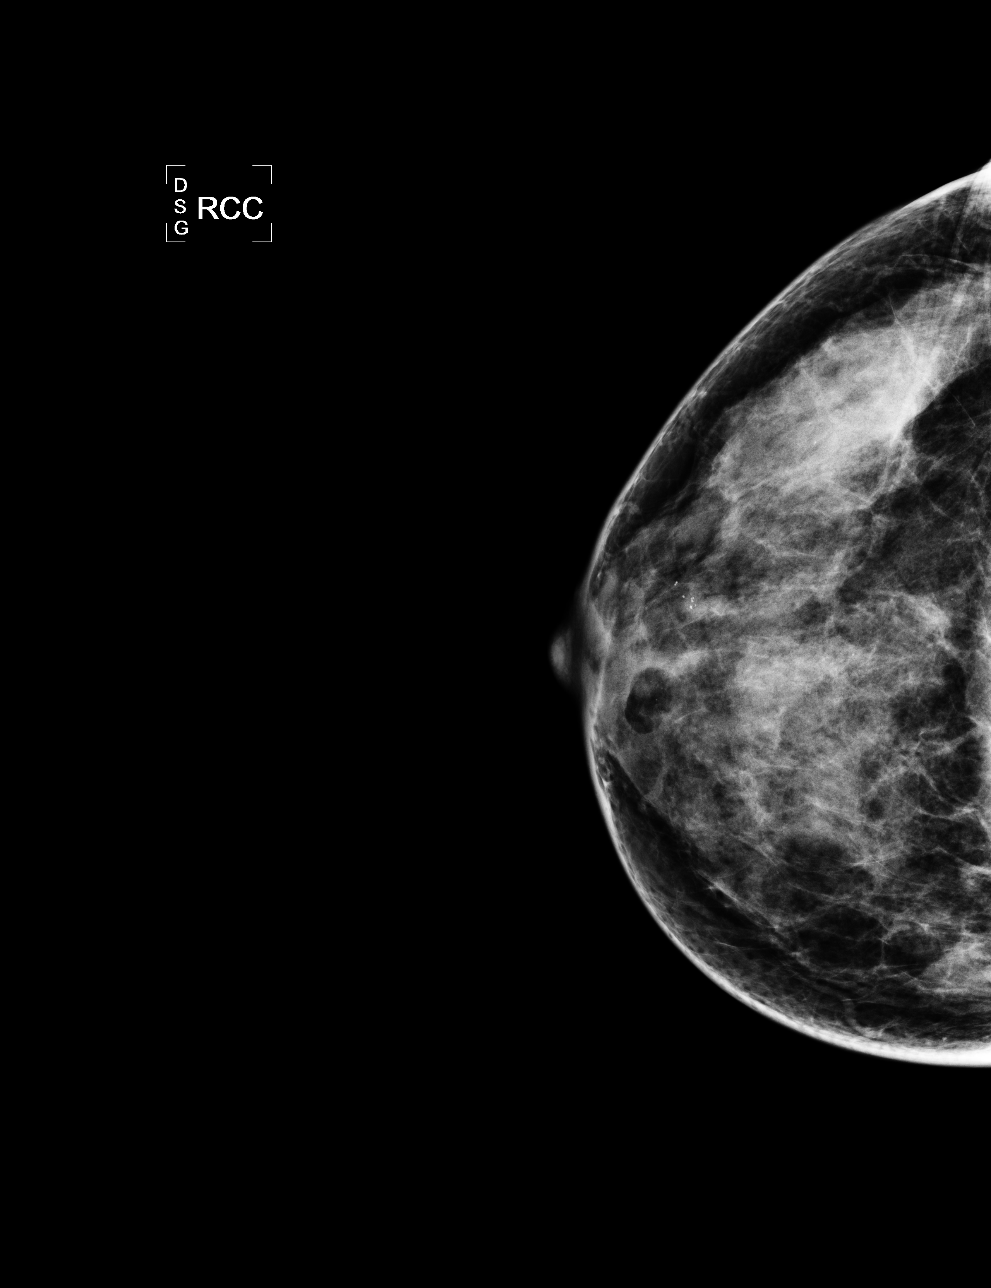

[R MLO]
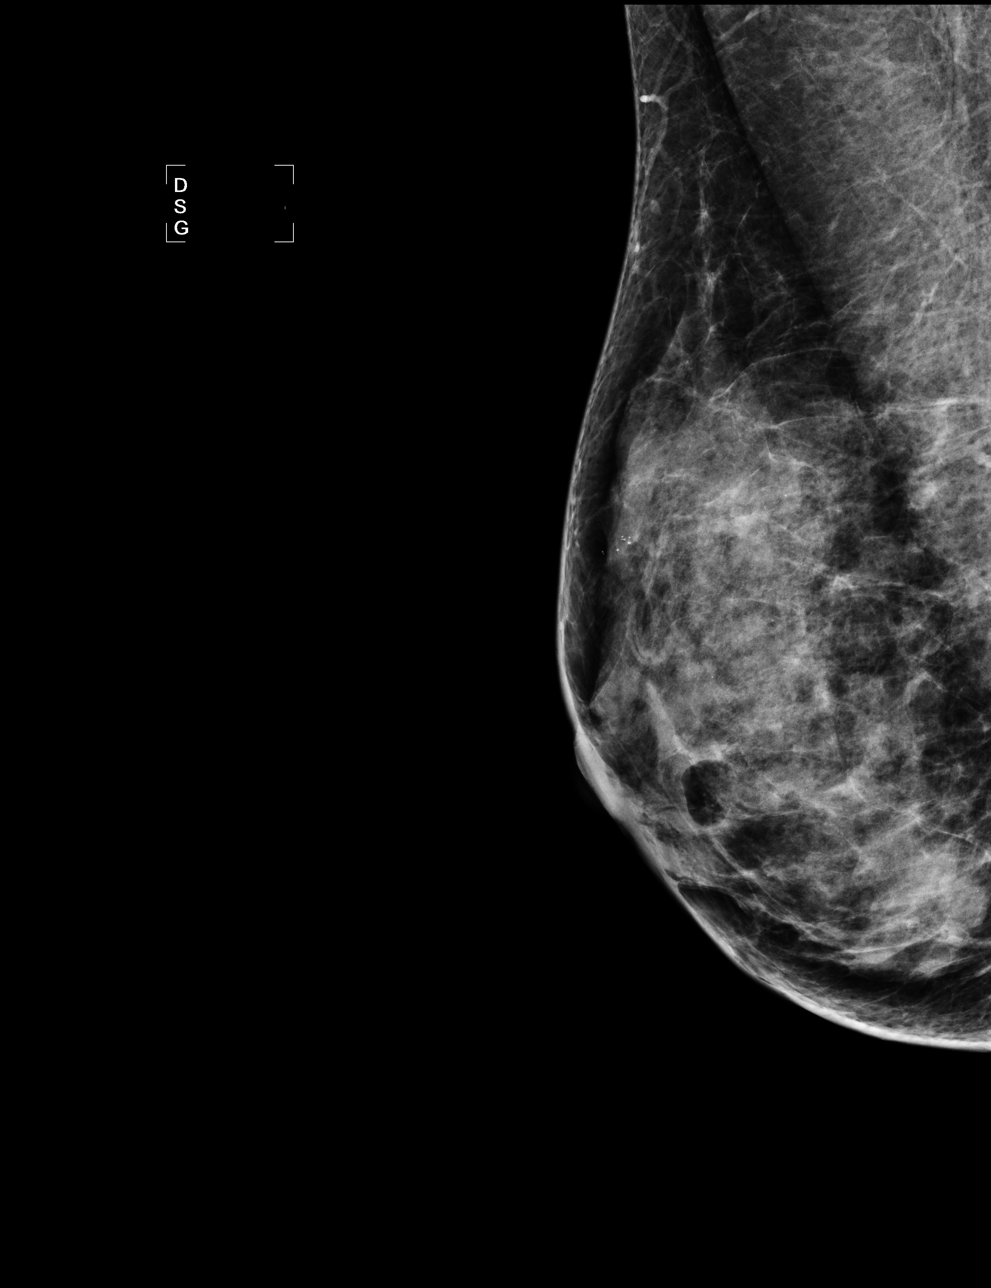

[L CC]
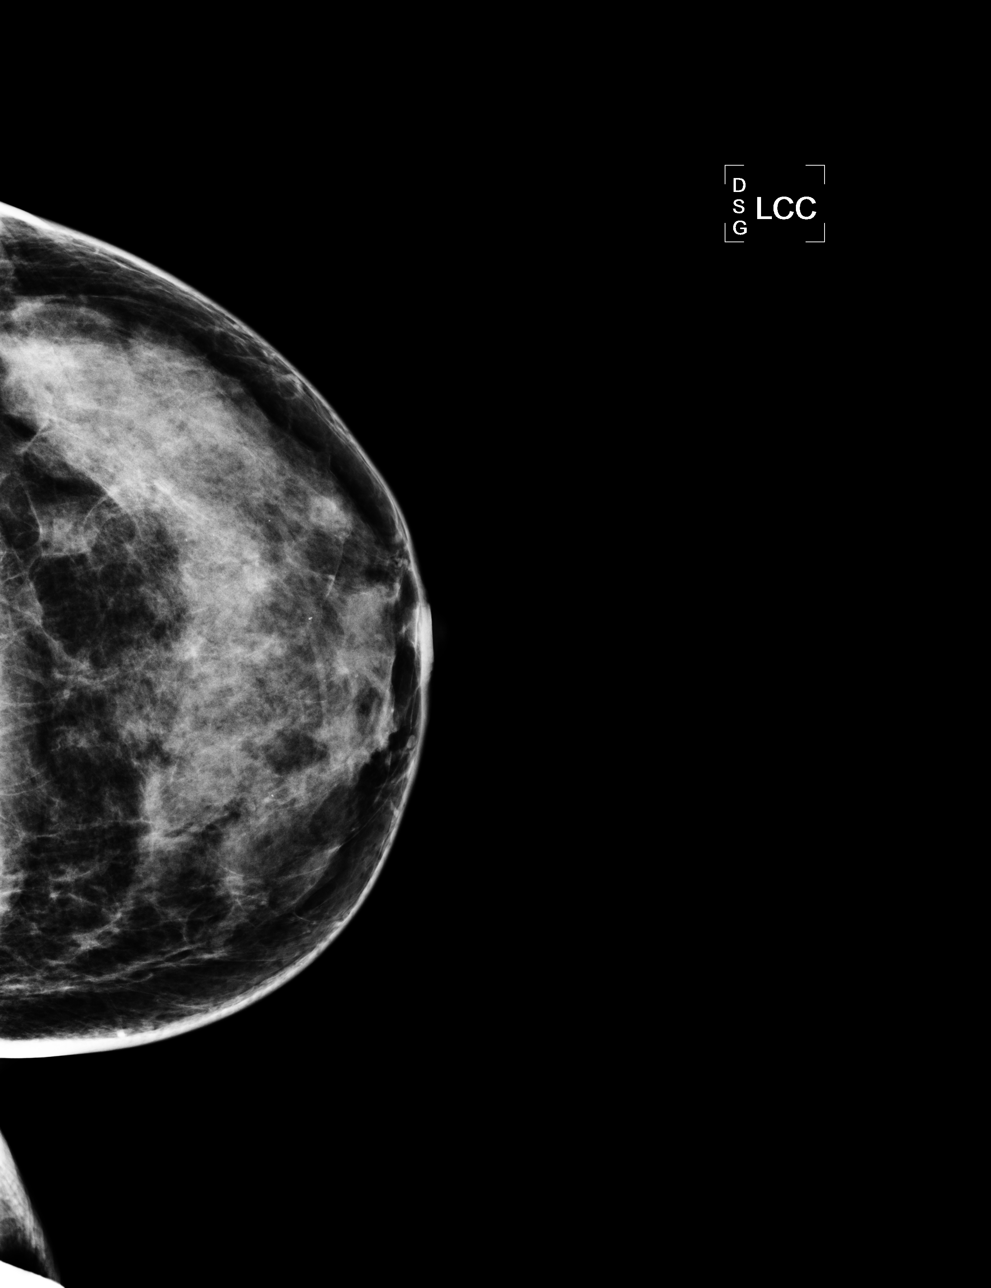

[L MLO]
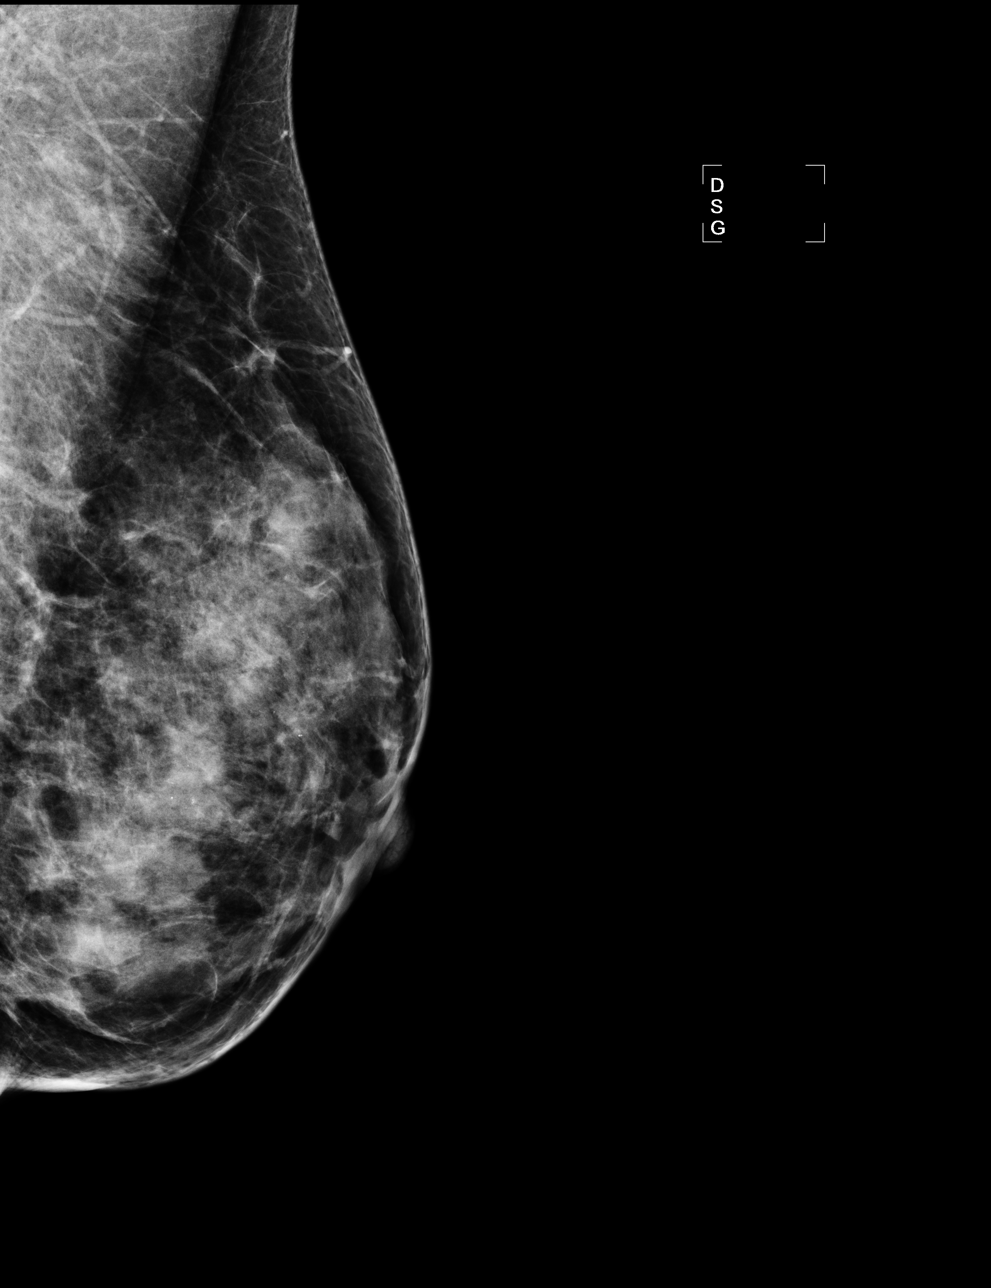

[4 of 4 positions shown; findings below may reference images not displayed]

FINDINGS: The breast tissue is extremely dense. No suspicious
masses, architectural distortion, or calcifications are present.

Images were processed with CAD.
IMPRESSION: No mammographic evidence of malignancy.

A result letter of this screening mammogram will be mailed directly
to the patient.

RECOMMENDATION:
Screening mammogram in one year. (Code:[3E])

BI-RADS CATEGORY 2:  Benign finding(s).

## 2015-01-17 DIAGNOSIS — C434 Malignant melanoma of scalp and neck: Secondary | ICD-10-CM | POA: Insufficient documentation

## 2015-01-21 DIAGNOSIS — E039 Hypothyroidism, unspecified: Secondary | ICD-10-CM | POA: Insufficient documentation

## 2016-02-09 DIAGNOSIS — L988 Other specified disorders of the skin and subcutaneous tissue: Secondary | ICD-10-CM | POA: Diagnosis not present

## 2016-02-09 DIAGNOSIS — D0359 Melanoma in situ of other part of trunk: Secondary | ICD-10-CM | POA: Diagnosis not present

## 2016-03-02 ENCOUNTER — Telehealth: Payer: Self-pay | Admitting: Family Medicine

## 2016-03-02 NOTE — Telephone Encounter (Signed)
Work number is [place]. Cannot get voice mail on cell phone. Pt was a pt of Dr Andria Frames until 2010. hasnt need PCP since then. She would like to become a pt again.  endocrinologist thought she was able to be followed by PCP rather than a specialist.  Please advise

## 2016-03-02 NOTE — Telephone Encounter (Signed)
Certainly

## 2016-03-05 DIAGNOSIS — R300 Dysuria: Secondary | ICD-10-CM | POA: Diagnosis not present

## 2016-03-05 DIAGNOSIS — N390 Urinary tract infection, site not specified: Secondary | ICD-10-CM | POA: Diagnosis not present

## 2016-03-19 DIAGNOSIS — Z01419 Encounter for gynecological examination (general) (routine) without abnormal findings: Secondary | ICD-10-CM | POA: Diagnosis not present

## 2016-03-19 DIAGNOSIS — Z6823 Body mass index (BMI) 23.0-23.9, adult: Secondary | ICD-10-CM | POA: Diagnosis not present

## 2016-04-27 DIAGNOSIS — D2261 Melanocytic nevi of right upper limb, including shoulder: Secondary | ICD-10-CM | POA: Diagnosis not present

## 2016-04-27 DIAGNOSIS — Z85820 Personal history of malignant melanoma of skin: Secondary | ICD-10-CM | POA: Diagnosis not present

## 2016-04-27 DIAGNOSIS — D225 Melanocytic nevi of trunk: Secondary | ICD-10-CM | POA: Diagnosis not present

## 2016-05-17 ENCOUNTER — Encounter: Payer: Self-pay | Admitting: Family Medicine

## 2016-05-17 ENCOUNTER — Ambulatory Visit (INDEPENDENT_AMBULATORY_CARE_PROVIDER_SITE_OTHER): Payer: 59 | Admitting: Family Medicine

## 2016-05-17 DIAGNOSIS — Z85820 Personal history of malignant melanoma of skin: Secondary | ICD-10-CM

## 2016-05-17 DIAGNOSIS — Z13220 Encounter for screening for lipoid disorders: Secondary | ICD-10-CM

## 2016-05-17 DIAGNOSIS — Z114 Encounter for screening for human immunodeficiency virus [HIV]: Secondary | ICD-10-CM

## 2016-05-17 DIAGNOSIS — E559 Vitamin D deficiency, unspecified: Secondary | ICD-10-CM | POA: Diagnosis not present

## 2016-05-17 DIAGNOSIS — Z1159 Encounter for screening for other viral diseases: Secondary | ICD-10-CM | POA: Diagnosis not present

## 2016-05-17 DIAGNOSIS — E039 Hypothyroidism, unspecified: Secondary | ICD-10-CM

## 2016-05-17 DIAGNOSIS — S4491XS Injury of unspecified nerve at shoulder and upper arm level, right arm, sequela: Secondary | ICD-10-CM

## 2016-05-17 DIAGNOSIS — S4491XA Injury of unspecified nerve at shoulder and upper arm level, right arm, initial encounter: Secondary | ICD-10-CM | POA: Insufficient documentation

## 2016-05-17 NOTE — Assessment & Plan Note (Signed)
Check TSH 

## 2016-05-17 NOTE — Assessment & Plan Note (Signed)
Screen today.  Not fasting but no Hx of cholesterol problems

## 2016-05-17 NOTE — Patient Instructions (Signed)
You should get a colonoscopy. Because you have had a hysterectomy, you no longer need pap smears. I will get records to make so that I know about your mammogram. You are due for a tetanus shot. Otherwise you are up to date with things. I will call with lab test results.

## 2016-05-17 NOTE — Assessment & Plan Note (Signed)
Check Vit d.

## 2016-05-17 NOTE — Progress Notes (Signed)
   Subjective:    Patient ID: Kara Pittman, female    DOB: 08/30/1965, 51 y.o.   MRN: [id number]  HPI Patient I have previously seen but not since 10/17/2011 here to [facility] care.  Issues: 1. Hypothyroid.  Sees endo.  Reportedly stable on current dose of meds.  Last TSH was 9/17?  We do not have results. 2. Similarly, Vitamin D deficient.  Now only taking OTC Vit D.  Avoid sun exposure (see #3)  No recent check per patient. 3. 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  Fair complected blond who knows to strictly avoid sun exposure. 4. Has had hysterectomy for benign disease..  Follow by Gyn (and they continue to do paps?)  Gyn doc also does mammograms.  I need copy of the results. 5. Never had colon cancer screen.  No previous colonoscopy. 6. To her knowledge, never screened for HIV or for Hep C 7. No previous cholesterol problems.  Five years since last lipid panel     Review of Systems     Objective:   Physical Exam HEENT.  Right pinna shortened at the top due to surgery.  Otherwise normal Neck supple.  Not thyromegally Lungs clear Cardiac RRR without m or g Abd benign. Right upper ext in sleeve for lymphedema.  Good gross motor.        Assessment & Plan:

## 2016-05-17 NOTE — Assessment & Plan Note (Signed)
Continue to avoid sun exposure despite vit d deficiency.

## 2016-05-17 NOTE — Assessment & Plan Note (Addendum)
Currently stable.  Still needs intermitent OT and PT

## 2016-05-17 NOTE — Assessment & Plan Note (Signed)
Screen x 1.  No risk factors

## 2016-05-18 ENCOUNTER — Encounter: Payer: Self-pay | Admitting: Family Medicine

## 2016-05-18 LAB — LIPID PANEL
Chol/HDL Ratio: 2.8 ratio (ref 0.0–4.4)
Cholesterol, Total: 191 mg/dL (ref 100–199)
HDL: 68 mg/dL (ref >39)
LDL Calculated: 109 mg/dL — ABNORMAL HIGH (ref 0–99)
Triglycerides: 68 mg/dL (ref 0–149)
VLDL Cholesterol Cal: 14 mg/dL (ref 5–40)

## 2016-05-18 LAB — TSH: TSH: 0.847 u[IU]/mL (ref 0.450–4.500)

## 2016-05-18 LAB — HIV ANTIBODY (ROUTINE TESTING W REFLEX): HIV Screen 4th Generation wRfx: NONREACTIVE

## 2016-05-18 LAB — VITAMIN D 25 HYDROXY (VIT D DEFICIENCY, FRACTURES): Vit D, 25-Hydroxy: 23.9 ng/mL — ABNORMAL LOW (ref 30.0–100.0)

## 2016-05-18 LAB — HEPATITIS C ANTIBODY: Hep C Virus Ab: <0.1 s/co ratio (ref 0.0–0.9)

## 2016-07-19 ENCOUNTER — Encounter: Payer: Self-pay | Admitting: Family Medicine

## 2016-07-19 NOTE — Progress Notes (Signed)
Outside records synopsis  Pap 03/19/16 Normal source vagina.   Neg for high risk HPV.  Will scan.  05/04/14 low vit D level at 19.2, HDL=82, LDL=100  03/19/16 normal mammogram  Will scan.  01/25/15 path report.  Melanoma in situ right ear.  All margins free of tumor.  Will scan

## 2016-08-14 ENCOUNTER — Encounter: Payer: Self-pay | Admitting: Family Medicine

## 2016-10-09 ENCOUNTER — Telehealth: Payer: Self-pay

## 2016-10-09 NOTE — Telephone Encounter (Signed)
Patient needs a refill on synthroid 75 mg and cytomel 5mg .She says that she called the pharmacy and was told that she had no more refills.Kara Pittman

## 2016-10-10 MED ORDER — LEVOTHYROXINE SODIUM 75 MCG PO TABS: 75.0000 ug | ORAL_TABLET | Freq: Every day | ORAL | 3 refills | Status: DC

## 2016-10-10 MED ORDER — LIOTHYRONINE SODIUM 5 MCG PO TABS: 5.0000 ug | ORAL_TABLET | Freq: Every day | ORAL | 3 refills | Status: DC

## 2016-10-10 NOTE — Telephone Encounter (Signed)
Called to try to verify dose.  Mobile phone - a female voicemail. Home phone, no answer and unable to leave a message.  See note to pharmacist.

## 2016-10-29 ENCOUNTER — Other Ambulatory Visit: Payer: Self-pay | Admitting: Family Medicine

## 2016-10-29 DIAGNOSIS — E039 Hypothyroidism, unspecified: Secondary | ICD-10-CM

## 2016-10-29 MED ORDER — LEVOTHYROXINE SODIUM 50 MCG PO TABS: ORAL_TABLET | ORAL | 3 refills | Status: DC

## 2016-10-29 NOTE — Assessment & Plan Note (Signed)
Crazy regimen from her former endocrinologist.  Takes: Synthyroid 75 mcg daily Cytomel 5 mcg daily In addition to above, take synthroid 50 mcg daily two days per weeks.

## 2017-03-27 DIAGNOSIS — Z1231 Encounter for screening mammogram for malignant neoplasm of breast: Secondary | ICD-10-CM | POA: Diagnosis not present

## 2017-04-12 ENCOUNTER — Encounter: Payer: Self-pay | Admitting: Family Medicine

## 2017-07-04 ENCOUNTER — Ambulatory Visit (INDEPENDENT_AMBULATORY_CARE_PROVIDER_SITE_OTHER): Payer: 59 | Admitting: Family Medicine

## 2017-07-04 ENCOUNTER — Other Ambulatory Visit: Payer: Self-pay

## 2017-07-04 ENCOUNTER — Encounter: Payer: Self-pay | Admitting: Family Medicine

## 2017-07-04 DIAGNOSIS — Z1211 Encounter for screening for malignant neoplasm of colon: Secondary | ICD-10-CM | POA: Insufficient documentation

## 2017-07-04 DIAGNOSIS — Z8249 Family history of ischemic heart disease and other diseases of the circulatory system: Secondary | ICD-10-CM | POA: Insufficient documentation

## 2017-07-04 DIAGNOSIS — N951 Menopausal and female climacteric states: Secondary | ICD-10-CM | POA: Insufficient documentation

## 2017-07-04 DIAGNOSIS — E039 Hypothyroidism, unspecified: Secondary | ICD-10-CM | POA: Diagnosis not present

## 2017-07-04 DIAGNOSIS — E559 Vitamin D deficiency, unspecified: Secondary | ICD-10-CM | POA: Diagnosis not present

## 2017-07-04 DIAGNOSIS — Z0000 Encounter for general adult medical examination without abnormal findings: Secondary | ICD-10-CM | POA: Diagnosis not present

## 2017-07-04 DIAGNOSIS — Z13220 Encounter for screening for lipoid disorders: Secondary | ICD-10-CM

## 2017-07-04 MED ORDER — ASPIRIN EC 81 MG PO TBEC: 81.0000 mg | DELAYED_RELEASE_TABLET | Freq: Every day | ORAL | Status: DC

## 2017-07-04 NOTE — Assessment & Plan Note (Signed)
Check lipids.  I feel like starting a statin regardless - treating her a secondary prevention based on her family history.  Await results.

## 2017-07-04 NOTE — Assessment & Plan Note (Signed)
Healthy woman with no bad habits or at risk behaviors.  Only concern is strong + fhx of heart disease

## 2017-07-04 NOTE — Progress Notes (Signed)
   Subjective:    Patient ID: Kara Pittman, female    DOB: [birth year], 53 y.o.   MRN: [id number]  HPI Annual physical: Doing well.  Just got engaged this past weekend.  Enjoying life.  Issues: Up to date on HPDP except colonoscopy and tetanus. Hypothyroid.  On strange regimen per endo.  Specifically levothyroxine takes 75 mcg daily and two days per week takes an extra 50 mcg.  Discussed leveling out to 88 mcg daily. STRONG FHX of CAD.  Father and 2 sibs.  I ran her 10 year numbers based on last year lipids and she was <1% 10 year risk.  Still, we are both worried. S/P hysterectomy.  Having hot and cold spells.  Wonders about menopause. Chronically low vit d.  Wants rechecked.     Review of Systems No chest pain, DOE, worrisome skin lesions.  No change in mentation, motor or sensory function.  Denies change in appetite, bowel or bladder.       Objective:   Physical Exam  HEENT normal Neck supple  Lungs clear Cardiac RRR without m or g Abd benign Ext normal Neuro Motor and sensory grossly intact.        Assessment & Plan:

## 2017-07-04 NOTE — Patient Instructions (Signed)
I will call with lab results.  The two things we need to decide on when the labs come in 1. How do we level out you thyroid dose.  The choice will be between  88 mcg daily or 100 mcg daily.  Will keep the cytomil 5.  Right now, your dose is closest to 88 daily 2. Do we treat you with a statin because of your family history.  Your current numbers suggest no statin.   3. I would suggest because of your family history that you take an 81 mg aspirin daily.  It has been shown to prevent heart attacks.

## 2017-07-04 NOTE — Assessment & Plan Note (Signed)
Refer for colonoscopy 

## 2017-07-04 NOTE — Assessment & Plan Note (Signed)
Add aspirin.  Likely add statin.

## 2017-07-04 NOTE — Assessment & Plan Note (Signed)
Check TSH.  Level out thyroid dose based on results.

## 2017-07-05 ENCOUNTER — Telehealth: Payer: Self-pay

## 2017-07-05 LAB — LIPID PANEL
Chol/HDL Ratio: 2.6 ratio (ref 0.0–4.4)
Cholesterol, Total: 172 mg/dL (ref 100–199)
HDL: 65 mg/dL (ref >39)
LDL Calculated: 99 mg/dL (ref 0–99)
Triglycerides: 39 mg/dL (ref 0–149)
VLDL Cholesterol Cal: 8 mg/dL (ref 5–40)

## 2017-07-05 LAB — FOLLICLE STIMULATING HORMONE: FSH: 16.2 m[IU]/mL

## 2017-07-05 LAB — VITAMIN D 25 HYDROXY (VIT D DEFICIENCY, FRACTURES): Vit D, 25-Hydroxy: 33.4 ng/mL (ref 30.0–100.0)

## 2017-07-05 LAB — TSH: TSH: 0.017 u[IU]/mL — ABNORMAL LOW (ref 0.450–4.500)

## 2017-07-05 NOTE — Telephone Encounter (Signed)
Letter mailed. Ottis Stain, CMA

## 2017-07-05 NOTE — Telephone Encounter (Signed)
Mobile number - Left generic VM for a return call to our office. Ottis Stain, CMA

## 2017-07-05 NOTE — Telephone Encounter (Signed)
-----   Message from Kinnie Feil, MD sent at 07/05/2017 12:35 PM EDT ----- Please contact patient to schedule f/u with Dr. Andria Frames to discuss slightly low TSH and review meds. Thanks.

## 2017-07-05 NOTE — Telephone Encounter (Signed)
Called both numbers on file. House number is not a working number. The mobile number has a female on the voice mail with a different last name. Did not leave a message. Will mail letter to pt.  Ottis Stain, CMA

## 2017-07-09 ENCOUNTER — Encounter: Payer: Self-pay | Admitting: Family Medicine

## 2017-08-01 ENCOUNTER — Encounter: Payer: 59 | Admitting: Family Medicine

## 2017-08-05 ENCOUNTER — Encounter: Payer: Self-pay | Admitting: Family Medicine

## 2017-09-20 ENCOUNTER — Other Ambulatory Visit: Payer: Self-pay

## 2017-09-20 NOTE — Telephone Encounter (Signed)
Patient called and needs refill on BRAND NAME Synthroid 75 mg and Cytomel 5 mg. Stated in the past she took an extra 50 mg Synthroid on the weekend.  Call back is [phone]  Danley Danker, RN [facility])

## 2017-09-21 MED ORDER — LIOTHYRONINE SODIUM 5 MCG PO TABS: 5.0000 ug | ORAL_TABLET | Freq: Every day | ORAL | 3 refills | Status: DC

## 2017-09-21 MED ORDER — LEVOTHYROXINE SODIUM 75 MCG PO TABS: 75.0000 ug | ORAL_TABLET | Freq: Every day | ORAL | 3 refills | Status: DC

## 2017-12-25 ENCOUNTER — Other Ambulatory Visit: Payer: Self-pay | Admitting: Family Medicine

## 2017-12-25 DIAGNOSIS — Z1211 Encounter for screening for malignant neoplasm of colon: Secondary | ICD-10-CM

## 2018-01-14 ENCOUNTER — Telehealth: Payer: Self-pay | Admitting: Family Medicine

## 2018-01-14 NOTE — Telephone Encounter (Signed)
Attempted to contact pt to discuss their overdue colonoscopy and their options. Unable to reach pt and sent to voicemail. -[place]

## 2018-01-23 ENCOUNTER — Telehealth: Payer: Self-pay | Admitting: Family Medicine

## 2018-01-23 NOTE — Telephone Encounter (Signed)
Now with redness in previous graph site and has axillary soreness in the same side.  No fever or systemic symptoms.  Slightly warm.  Just started septra x 7 days for a UTI.  No additional antibiotics at this time.  She is welcome to call back at any time re: this at risk arm.

## 2018-01-23 NOTE — Telephone Encounter (Signed)
Pt would like for Dr. Andria Frames to call her. She wants to discuss some issues she is having in her arm. She had a skin graft years ago and she is having some complications and think she may have an infection.   I suggested being seen at urgent care or ED due to having no openings and she said she prefers to speak with Dr. Andria Frames before deciding what she should do.   The best call back number is her cell phone [phone].

## 2018-04-02 ENCOUNTER — Telehealth: Payer: Self-pay

## 2018-04-02 NOTE — Telephone Encounter (Signed)
Patient left message asking to speak with PCP only, at his convenience.  Call back is [phone].  Danley Danker, RN [facility] RN)

## 2018-04-02 NOTE — Telephone Encounter (Signed)
Oh, my.  Kara Pittman's adult son, Kara Pittman, died unexpectedly.  He was found in his car in a parking lot downtown.  He was going through a bitter divorce with his 55 yo son, who he loved dearly, being used as a Environmental consultant by his wife.  The drama continued into the funeral - insult to injury.  An autopsy was performed.  Results are pending.  Obviously, Kara Pittman is having a tough time.  Apparently death was Mar 26, 2022, so, the wound is not fresh, but it is still raw.  She does not need medication right now.  She thanked me for talking.    And she thanked me for my care of Kara Pittman, who I cared for from infancy until high school graduation.  Such a tragic loss.

## 2018-05-05 ENCOUNTER — Telehealth: Payer: Self-pay

## 2018-05-05 DIAGNOSIS — F4321 Adjustment disorder with depressed mood: Secondary | ICD-10-CM

## 2018-05-05 NOTE — Telephone Encounter (Signed)
A female voice left a message on nurse line, he did not state his name. He was calling on behalf of the patient. He stated she recently lost her son, looks like back in February. He stated she is having a hard time with this and there was an "incident" this past weekend. He stated she would like to be seen by PCP, and she would have called herself, however she is at work. Pt can be reached at [phone] after 12pm this afternoon. Please advise.

## 2018-05-06 DIAGNOSIS — F4321 Adjustment disorder with depressed mood: Secondary | ICD-10-CM | POA: Insufficient documentation

## 2018-05-06 MED ORDER — CITALOPRAM HYDROBROMIDE 20 MG PO TABS
20.0000 mg | ORAL_TABLET | Freq: Every day | ORAL | 3 refills | Status: DC
Start: 2018-05-06 — End: 2018-06-27

## 2018-05-06 NOTE — Telephone Encounter (Signed)
Adult son died unexpectedly ~2 months ago.  Crying frequently.  Not sleeping well.  Disorganized thoughts.  No SI or HI.  Friends are telling her to call her doctor.  Willing to take meds  This weekend had an episode of passing out while standing up.  No preceding sx.  No palpitations.  No CP/  Runs low blood pressures normally.  No hx of DVT and no reason for blood clots.  No leg swelling.  Did injure foot (laceration) that she managed.  Will start celexa.  See me when covid outbreak over.  I may refer to cards due to her incredible FHx.

## 2018-05-06 NOTE — Assessment & Plan Note (Signed)
Start celexa.

## 2018-06-27 ENCOUNTER — Telehealth: Payer: Self-pay | Admitting: *Deleted

## 2018-06-27 DIAGNOSIS — F4321 Adjustment disorder with depressed mood: Secondary | ICD-10-CM

## 2018-06-27 MED ORDER — BUPROPION HCL ER (XL) 150 MG PO TB24: 150.0000 mg | ORAL_TABLET | Freq: Every day | ORAL | 3 refills | Status: DC

## 2018-06-27 NOTE — Telephone Encounter (Signed)
Having significant side effects with the celexa.  Initially, tiredness and flu like sx.  No only tiredness.  Also, increased appetite and significant wt gain.  No SI or HI. Will switch to welbutrin.

## 2018-06-27 NOTE — Telephone Encounter (Signed)
Patient states that maybe her celexa needs to be increased.  She would like a call back from MD. She declined virtual visit Christen Bame, CMA

## 2018-08-24 ENCOUNTER — Other Ambulatory Visit: Payer: Self-pay | Admitting: Family Medicine

## 2018-08-24 DIAGNOSIS — E039 Hypothyroidism, unspecified: Secondary | ICD-10-CM

## 2018-09-23 ENCOUNTER — Other Ambulatory Visit: Payer: Self-pay | Admitting: Family Medicine

## 2018-10-22 ENCOUNTER — Other Ambulatory Visit: Payer: Self-pay | Admitting: Family Medicine

## 2018-10-22 DIAGNOSIS — F4321 Adjustment disorder with depressed mood: Secondary | ICD-10-CM

## 2018-11-25 ENCOUNTER — Telehealth: Payer: Self-pay | Admitting: *Deleted

## 2018-11-25 NOTE — Telephone Encounter (Signed)
Pt calls back her breast is painful to the touch and has a lump.  She is reluctant to see anyone other than [facility] at first (1st available is 11/2).  Finally able to get pt to agree to appt with another provider, but only if it is a female.  Appt made with Dr. Enid Derry for Thursday AM @ 8:30  Pt is concerned about the bill she will received and would like to speak with Dr. Andria Frames about this.   Christen Bame, CMA

## 2018-11-25 NOTE — Telephone Encounter (Signed)
Patient with right breast mass "a golf ball" that has come up over the last couple months.  Also, has worries about affording work up.  She has insurance that kicks in in January.  I gave her similar information via phone that I sent in this email to her.  Kara Pittman, You definitely need to keep your appointment with Dr. Enid Derry.  She will set you up for a diagnostic right mammogram (and likely an ultrasound).    In the meantime, you can look into these scholarship programs. [email or web address]  Good luck.  I will be looking for the results.  Elizzie Westergard A. Andria Frames, MD

## 2018-11-25 NOTE — Telephone Encounter (Signed)
Pts fiance calls inquiring about the free mammograms.  He was told they will need an order.   After speaking with him I found out that pt is having some tenderness in one area of her breast. But [name] was not sure exactly where.   Advised that an appt would definitely need to be made to insure we were sending her for the right test.   [name] is reluctant as pt just started a new job and did not have time off.  He asked that I call pt.  Done as requested but had to leave message. Christen Bame, CMA

## 2018-11-25 NOTE — Telephone Encounter (Signed)
Pt would like for Dr. Andria Frames to call her to discuss.  Christen Bame, CMA

## 2018-11-27 ENCOUNTER — Encounter: Payer: Self-pay | Admitting: Family Medicine

## 2018-11-27 ENCOUNTER — Ambulatory Visit (INDEPENDENT_AMBULATORY_CARE_PROVIDER_SITE_OTHER): Payer: Self-pay | Admitting: Licensed Clinical Social Worker

## 2018-11-27 ENCOUNTER — Other Ambulatory Visit: Payer: Self-pay

## 2018-11-27 ENCOUNTER — Ambulatory Visit (INDEPENDENT_AMBULATORY_CARE_PROVIDER_SITE_OTHER): Payer: Medicaid Other | Admitting: Family Medicine

## 2018-11-27 VITALS — BP 120/70 | HR 75 | Ht 65.0 in | Wt 138.4 lb

## 2018-11-27 DIAGNOSIS — N6310 Unspecified lump in the right breast, unspecified quadrant: Secondary | ICD-10-CM

## 2018-11-27 DIAGNOSIS — Z789 Other specified health status: Secondary | ICD-10-CM

## 2018-11-27 DIAGNOSIS — N6341 Unspecified lump in right breast, subareolar: Secondary | ICD-10-CM

## 2018-11-27 NOTE — Patient Instructions (Signed)
Thank you for coming to see me today. It was a pleasure!  Please follow-up as needed.  If you have any questions or concerns, please do not hesitate to call the office at [phone].  Take Care,   Martinique Monchel Pollitt, DO

## 2018-11-27 NOTE — Progress Notes (Signed)
  Subjective:  Patient ID: JARVIS KNODEL  DOB: August 22, 1965 MRN: [id number]  CADYNCE GARRETTE is a 53 y.o. female with a PMH of hypothyroidism, here today for right breast lump.   HPI:  Right breast lump: -Patient reports that in June she started taking a supplement called Serovital, which she believes has hormones in it in order to help with her skin.  She states that she noticed a week later that there was a mass in her breast.  She had lost insurance and was going to get a new job so did not be seen in the office for this.  She continue to watch it and noticed that it was getting bigger and firmer.   -She states that she feels it present all day and it is a constant feeling.  States is only tender when she touches it.  It is not discolored there is no heat and no discharge.  She denies any redness.  She denies any fevers, chills, unintentional weight loss, unintentional weight gain, night sweats.  She reports that on her previous mammograms they have only reported that she has had dense areas with calcium deposits that have previously been ultrasounded but were not concerning. -She states that this mass has started to grow so much that it has now woken her up at night anytime she rolls over.     ROS: As mentioned in HPI  Family hx: her mom and her aunt both had benign breast lumps removed via lumpectomy; otherwise no family history of breast cancer Smoking status reviewed  Patient Active Problem List   Diagnosis Date Noted  . Breast mass, right 12/01/2018  . Grief 05/06/2018  . Menopausal symptom 07/04/2017  . Colon cancer screening 07/04/2017  . Encounter for preventative adult health care examination 07/04/2017  . Family history of coronary arteriosclerosis 07/04/2017  . Hypothyroidism 05/17/2016  . Vitamin D deficiency 05/17/2016  . Screening cholesterol level 05/17/2016  . Personal history of malignant melanoma of skin 05/17/2016  . 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 RHINITIS, ALLERGIC 04/04/2006  . 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 Pulm: normal effort Extremities: no edema or cyanosis. WWP. Skin: warm and dry, no rashes noted Neuro: alert and oriented, no focal deficits Psych: normal affect, normal thought content  Assessment & Plan:   Breast mass, right Mass concerning given the amount has grown over the past 3 months as well as being fixed and firm.  Located in center of breast with some overlying skin changes due to tightening likely from growth of mass.  No concerning family medical history as mom and aunt both had benign removal of masses previously.   -Will order diagnostic mammogram. -Patient to have insurance in January but is currently without insurance, met with our Education officer, museum today given current work-up can not wait.   Martinique Calton [place], DO Family Medicine Resident PGY-3

## 2018-11-27 NOTE — BH Specialist Note (Signed)
   Care Coordination  Social Work   11/27/2018 Name: Kara Pittman MRN: [id number] DOB: 04-18-1965  Kara Pittman is a 53 y.o. year old female who sees Hensel, Jamal Collin, MD for primary care. LCSW was consulted by Dr. Enid Derry for assistance with [employer] . Met with patient in office to assess needs and barriers. Assessment :Patient lost job and insurance earlier this year, has started new job but insurance will not start until Jan.  She need screening and treatment for breast mass. Recommendation:Patient may benefit from and is in agreement to explore options discussed today. Goals: obtain resources or  insurance options  Current Barriers:  . no insurance  . Lacks knowledge of community resource: about her options   . Financial restraints  Clinical Social Work Goal(s): Patient declined F/U Interventions: . Patient interviewed/ assessed to obtain needed information to provide interventions. Discussed community insurance options below  . Solution-Focused Strategies  Medicaid  - local Department of social Services on online applications  DEPARTMENT OF SOCIAL SERVICES: [place], [place] [address] [phone] [address] [place] [email or web address]   Call [phone] [place] [facility] [employer]   [email or web address]   [phone]  [place] serves any patient with an income between 0%-200% of the federal poverty level.  [facility] patients must be exempt from the [place] in order to enroll into the [facility] Patient Self Care Activities:  . Attends all scheduled provider appointments . Performs ADL's independently . Performs IADL's independently . Independent living . Motivation for treatment Patient Coping Strengths:  . Hopefulness . Self Advocate . Able to Communicate Effectively Patient Self Care Deficits:  . unsure where to start with insurance options  Plan:  1. Client will call resources provided and will call LCSW with  questions 2. The patient will call the afordable care act* as advised to LCSW.  3. No further follow up required: by LCSW at this time  4. Next PCP appointment scheduled for: 12/08/2018  Casimer Lanius, LCSW Clinical Social Worker [place] / [place]   [phone] 10:09 AM

## 2018-12-01 ENCOUNTER — Telehealth: Payer: Self-pay | Admitting: *Deleted

## 2018-12-01 DIAGNOSIS — N6310 Unspecified lump in the right breast, unspecified quadrant: Secondary | ICD-10-CM | POA: Insufficient documentation

## 2018-12-01 NOTE — Telephone Encounter (Signed)
I just called and left a message for Kara Pittman to let me know if she has already scheduled an appt with [facility].  There is nothing else needed from PCP, as she has a follow up with him on 12/08/2018.  Kara Pittman,CMA

## 2018-12-01 NOTE — Telephone Encounter (Signed)
LM with Nevin Bloodgood at the [facility] program regarding urgent need for this patient to be seen.  Left information for Nevin Bloodgood to either reach patient or me for this appointment.  Did try to call her last week and was unable to reach her.  Will continue to leave messages for Nevin Bloodgood as patient doesn't have insurance and this program would cover the cost of imaging as well as follow up plans.  Jazmin Hartsell,CMA

## 2018-12-01 NOTE — Assessment & Plan Note (Signed)
Mass concerning given the amount has grown over the past 3 months as well as being fixed and firm.  Located in center of breast with some overlying skin changes due to tightening likely from growth of mass.  No concerning family medical history as mom and aunt both had benign removal of masses previously.   -Will order diagnostic mammogram. -Patient to have insurance in January but is currently without insurance, met with our Education officer, museum today given current work-up can not wait.

## 2018-12-01 NOTE — Telephone Encounter (Signed)
Noted.  I believe she has an appointment.  Do I need to do anything at this point to facilitate her care?

## 2018-12-02 ENCOUNTER — Other Ambulatory Visit (HOSPITAL_COMMUNITY): Payer: Self-pay | Admitting: *Deleted

## 2018-12-02 ENCOUNTER — Telehealth (HOSPITAL_COMMUNITY): Payer: Self-pay

## 2018-12-02 DIAGNOSIS — N6310 Unspecified lump in the right breast, unspecified quadrant: Secondary | ICD-10-CM

## 2018-12-02 NOTE — Telephone Encounter (Signed)
Spoke with patient today after reaching [place] with [facility].  They have reached out to patient but have been unable to reach her and were awaiting a call back.  Informed them that I would reach out to patient too.  Ms. Sanangelo said that she did get the message and tried to call back but was unable to reach them.  I gave her the clinic number that I was able to reach [place] at and asked her to call them.  Patient did say that she has been experiencing some increased pain and more trouble sleeping at night.  She gave verbal ok to speak with Adam Phenix [phone]), her fiance, as he has better access to his phone during the day. Jazmin Hartsell,CMA

## 2018-12-02 NOTE — Telephone Encounter (Signed)
Left message with patient about scheduling appointment with [facility] program. Left name and number for patient to call back.

## 2018-12-04 ENCOUNTER — Encounter (HOSPITAL_COMMUNITY): Payer: Self-pay

## 2018-12-04 ENCOUNTER — Other Ambulatory Visit: Payer: Self-pay

## 2018-12-04 ENCOUNTER — Ambulatory Visit (HOSPITAL_COMMUNITY)
Admission: RE | Admit: 2018-12-04 | Discharge: 2018-12-04 | Disposition: A | Discharge status: Home / Self Care | Payer: Self-pay | Source: Ambulatory Visit | Attending: Obstetrics and Gynecology | Admitting: Obstetrics and Gynecology

## 2018-12-04 VITALS — BP 130/88 | Temp 97.8°F | Wt 140.0 lb

## 2018-12-04 DIAGNOSIS — N6310 Unspecified lump in the right breast, unspecified quadrant: Secondary | ICD-10-CM

## 2018-12-04 DIAGNOSIS — Z1239 Encounter for other screening for malignant neoplasm of breast: Secondary | ICD-10-CM

## 2018-12-04 HISTORY — DX: Hypothyroidism, unspecified: E03.9

## 2018-12-04 NOTE — Patient Instructions (Signed)
Explained breast self awareness with Wynonia Sours. Patient did not need a Pap smear today due to last Pap smear was 03/19/2016 and patient has a history of a hysterectomy for benign reasons. Let patient know that she doesn't need any further Pap smears due to her history of a hysterectomy for benign reasons. Referred patient to the [facility] for a diagnostic mammogram and a right breast ultrasound. Appointment scheduled for Friday, December 05, 2018 at 1130. Patient aware of appointment and will be there. Wynonia Sours verbalized understanding.  [place]ann[place], Arvil Chaco, RN 2:48 PM

## 2018-12-04 NOTE — Progress Notes (Signed)
Complaints of a right breast lump since June or July that has increased in size and become painful. Patient states the pain is constant. Patient rates the pain at a 4 out of 10.  Pap Smear: Pap smear not completed today. Last Pap smear was 03/19/2016 at [facility] and normal. Per patient has no history of an abnormal Pap smear. Patient has a history of a hysterectomy in 2008 for fibroids. Patient doesn't need any further Pap smears due to her history of hysterectomy for benign reasons per [facility] and ACOG guidelines. Last Pap smear result is in Epic.  Physical exam: Breasts Breasts symmetrical. No skin abnormalities bilateral breasts. No nipple retraction bilateral breasts. No nipple discharge bilateral breasts. No lymphadenopathy. No lumps palpated left breast. Palpated a 7 cm x 7 cm lump within the right breast under the areola. Complaints of pain when palpated right breast lump on exam. Referred patient to the [facility] for a diagnostic mammogram and a right breast ultrasound. Appointment scheduled for Friday, December 05, 2018 at 1130.        Pelvic/Bimanual No Pap smear completed today since last Pap smear was 03/19/2016 and patient has a history of a hysterectomy for benign reasons. Pap smear not indicated per [facility] guidelines.   Smoking History: Patient has never smoked.  Patient Navigation: Patient education provided. Access to services provided for patient through [facility] program.   Colorectal Cancer Screening: Per patient has never had a colonoscopy completed. No complaints today.   Breast and Cervical Cancer Risk Assessment: Patient has no family history of breast cancer, known genetic mutations, or radiation treatment to the chest before age 31. Patient has no history of cervical dysplasia, immunocompromised, or DES exposure in-utero.  Risk Assessment    Risk Scores      12/04/2018   Last edited by: Loletta Parish, RN   5-year risk: 1.1 %   Lifetime  risk: 7.4 %

## 2018-12-05 ENCOUNTER — Other Ambulatory Visit (HOSPITAL_COMMUNITY): Payer: Self-pay | Admitting: Obstetrics and Gynecology

## 2018-12-05 ENCOUNTER — Ambulatory Visit
Admission: RE | Admit: 2018-12-05 | Discharge: 2018-12-05 | Disposition: A | Discharge status: Home / Self Care | Payer: Self-pay | Source: Ambulatory Visit | Attending: Obstetrics and Gynecology | Admitting: Obstetrics and Gynecology

## 2018-12-05 ENCOUNTER — Ambulatory Visit
Admission: RE | Admit: 2018-12-05 | Discharge: 2018-12-05 | Disposition: A | Discharge status: Home / Self Care | Payer: No Typology Code available for payment source | Source: Ambulatory Visit | Attending: Obstetrics and Gynecology | Admitting: Obstetrics and Gynecology

## 2018-12-05 DIAGNOSIS — R922 Inconclusive mammogram: Secondary | ICD-10-CM | POA: Diagnosis not present

## 2018-12-05 DIAGNOSIS — N6310 Unspecified lump in the right breast, unspecified quadrant: Secondary | ICD-10-CM

## 2018-12-05 DIAGNOSIS — N6001 Solitary cyst of right breast: Secondary | ICD-10-CM | POA: Diagnosis not present

## 2018-12-05 IMAGING — MG DIGITAL DIAGNOSTIC BILAT W/ TOMO
6 of 10 series · 6 of 30 positions shown · non-contrast
Comparison: Previous exam(s).

CLINICAL DATA: 53-year-old female with palpable RIGHT breast lump
identified on self-examination. Also for annual bilateral mammogram.

EXAM:
DIGITAL DIAGNOSTIC BILATERAL MAMMOGRAM WITH CAD AND TOMO
ULTRASOUND RIGHT BREAST

[L CC synth-2D]
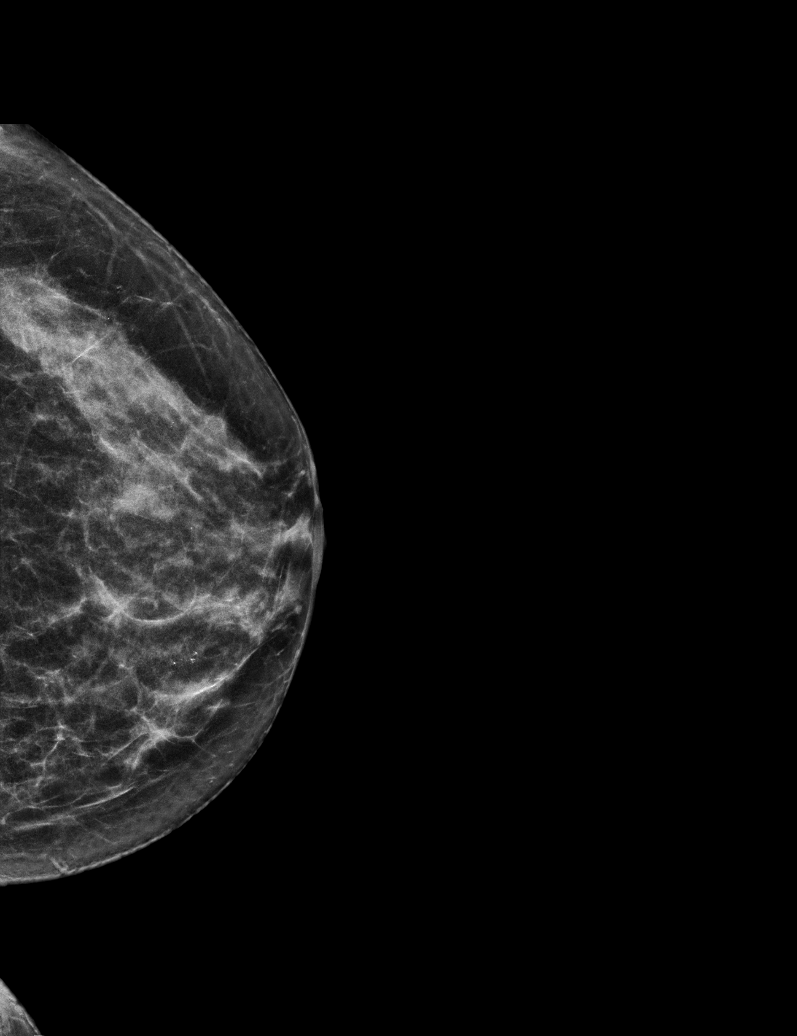

[L MLO synth-2D]
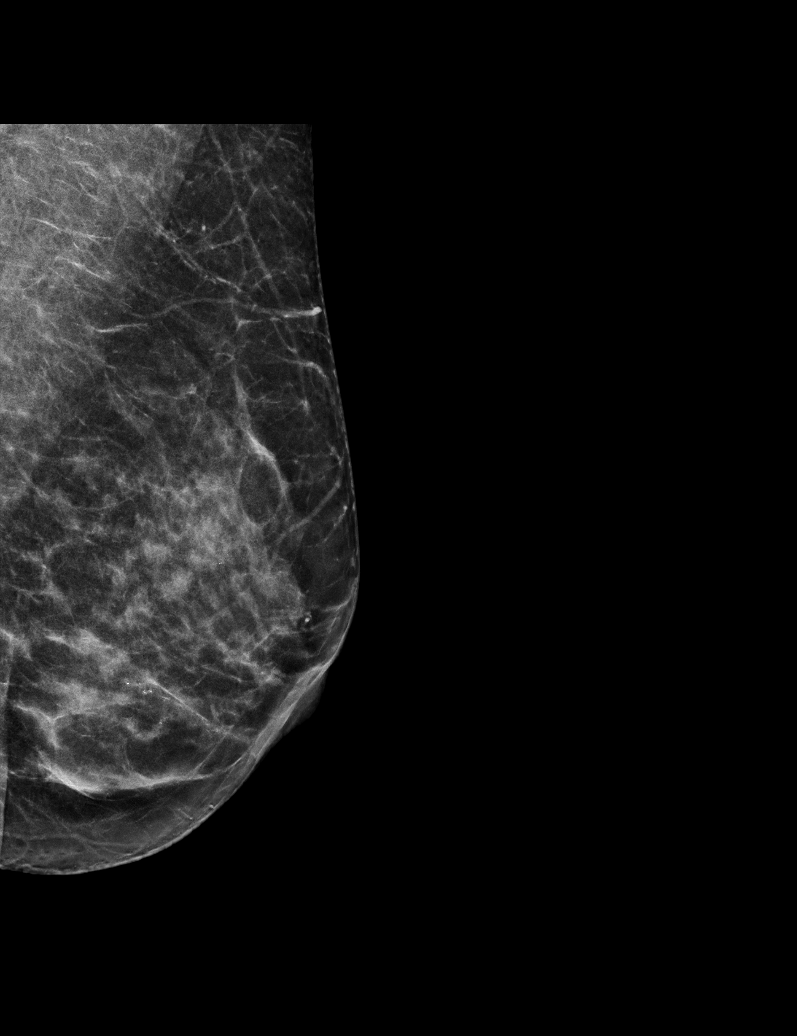

[R TAN synth-2D]
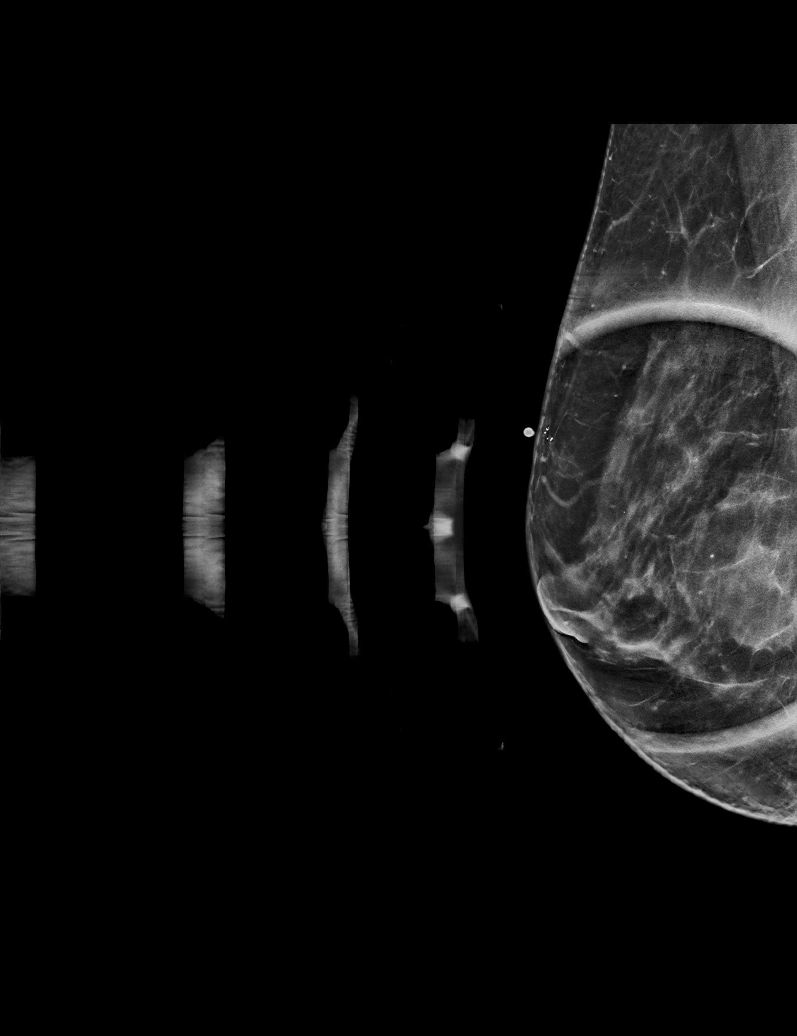

[R MLO synth-2D]
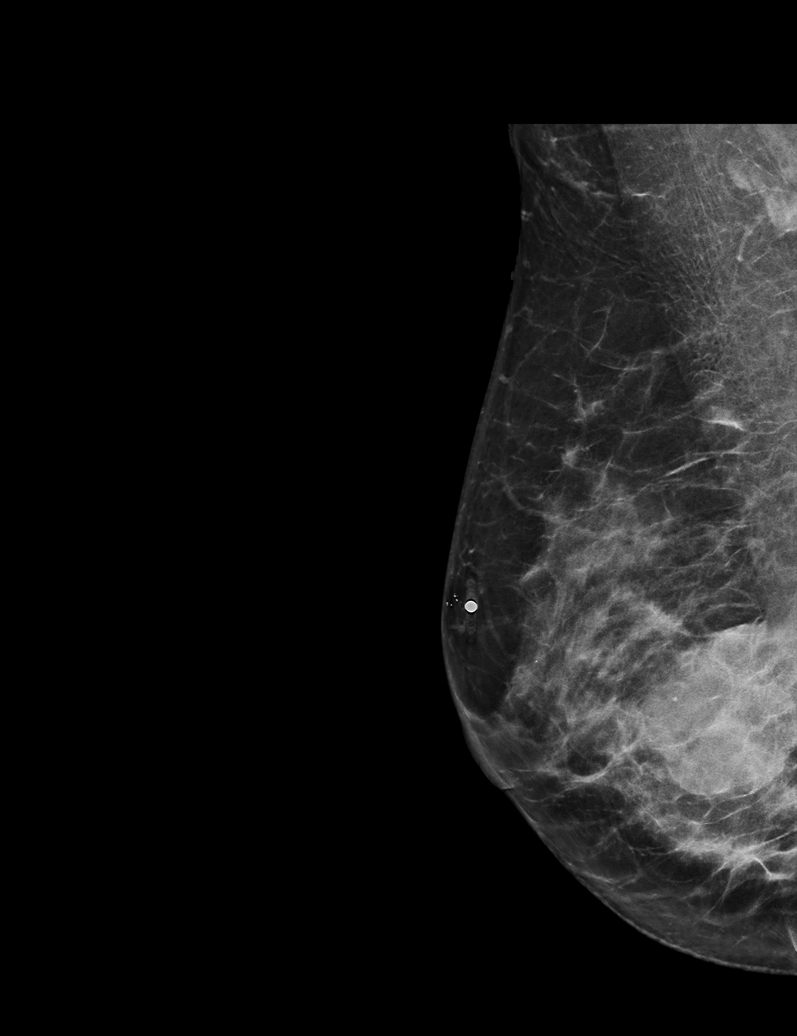

[R CC synth-2D]
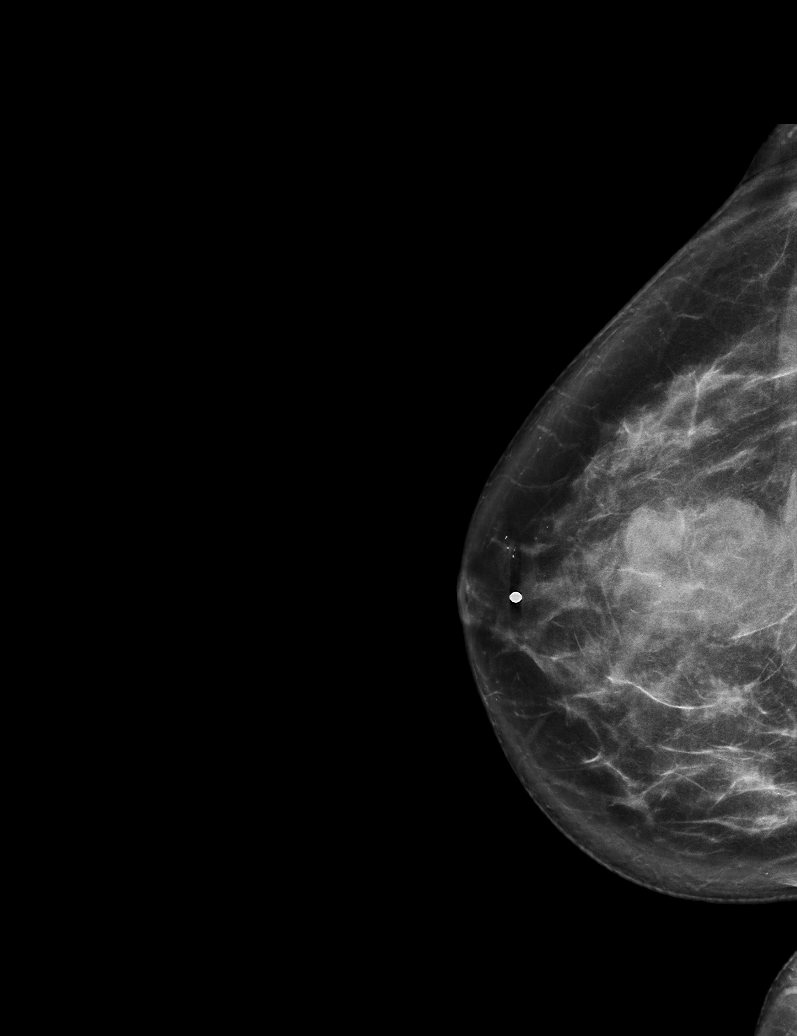

[R MLO tomo · tomo slice 33/66.0]
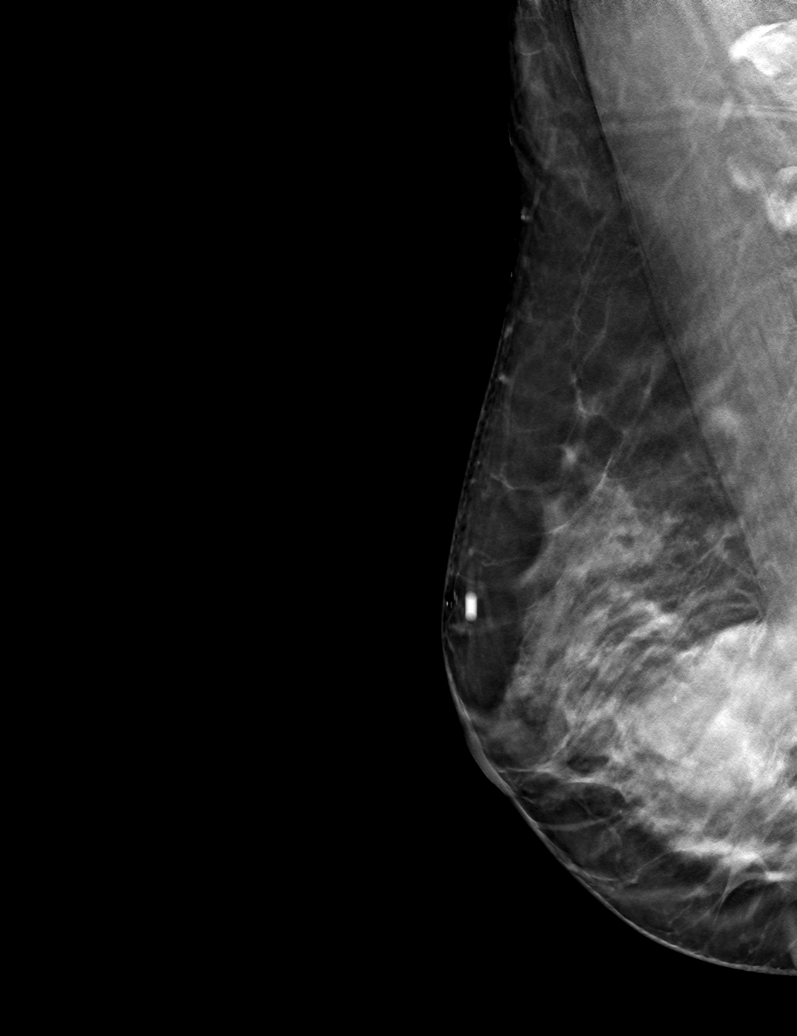

[6 of 30 positions shown; findings below may reference images not displayed]

ACR Breast Density Category c: The breast tissue is heterogeneously
dense, which may obscure small masses.
FINDINGS: 2D/3D full field and spot compression views of the RIGHT breast
demonstrate an obscured mass within the posterior central RIGHT
breast.

No other suspicious findings are noted within either breast.

Mammographic images were processed with CAD.

On physical exam, a firm fixed mass within the central LEFT breast
noted.

Targeted ultrasound is performed, showing a 3.3 x 2.7 x 3.2 cm
complex solid and cystic mass (with peripheral soft tissue
nodularity) within the central RIGHT breast centered at the 6
o'clock position 1 cm from the nipple.

No abnormal RIGHT axillary lymph nodes are identified.
IMPRESSION: Suspicious 3.3 cm complex solid and cystic RIGHT breast mass. Tissue
sampling recommended.

No abnormal RIGHT axillary lymph nodes identified.

RECOMMENDATION:
Ultrasound-guided RIGHT breast biopsy, which will be scheduled.

I have discussed the findings and recommendations with the patient.
If applicable, a reminder letter will be sent to the patient
regarding the next appointment.

BI-RADS CATEGORY  4: Suspicious.

## 2018-12-05 IMAGING — US US BREAST*R* LIMITED INC AXILLA
1 series · 11 of 11 positions shown · non-contrast
Comparison: Previous exam(s).

CLINICAL DATA: 53-year-old female with palpable RIGHT breast lump
identified on self-examination. Also for annual bilateral mammogram.

EXAM:
DIGITAL DIAGNOSTIC BILATERAL MAMMOGRAM WITH CAD AND TOMO
ULTRASOUND RIGHT BREAST

[Series 1: us breast*right* limited inc axilla · 0.08mm/px · 11 of 11 slices shown]
[im 1/11]
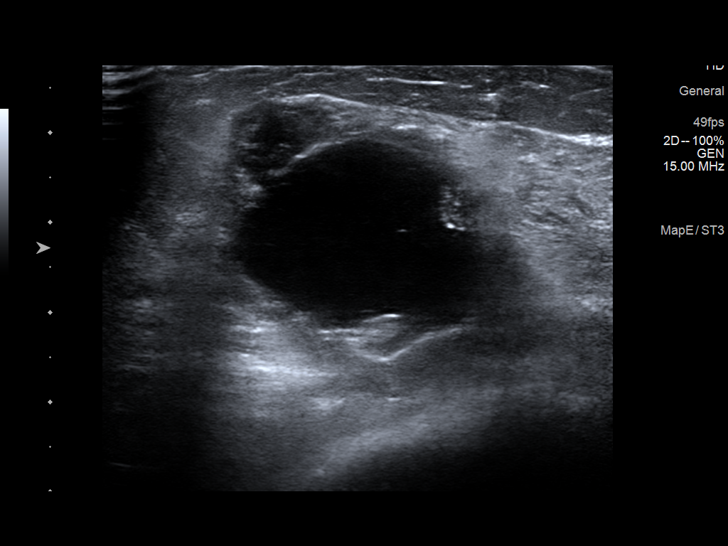
[im 2/11]
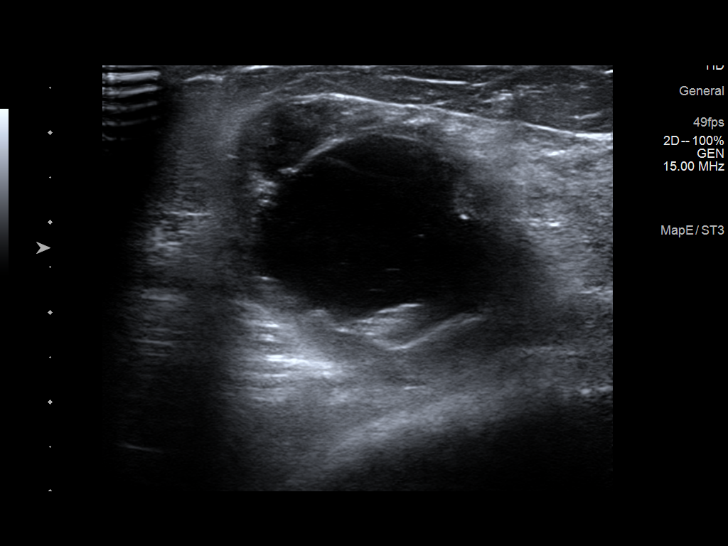
[im 3/11]
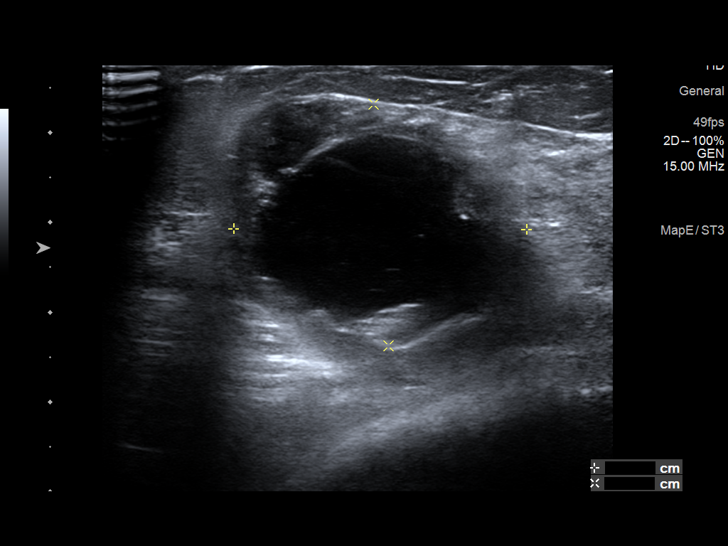
[im 4/11]
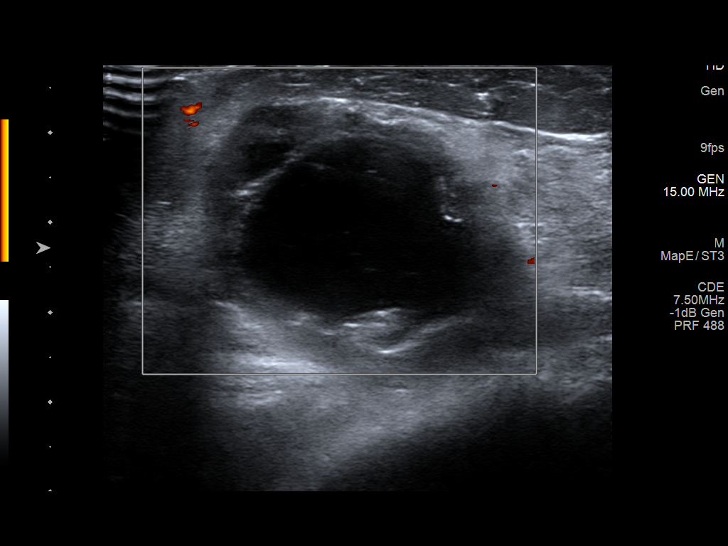
[im 5/11]
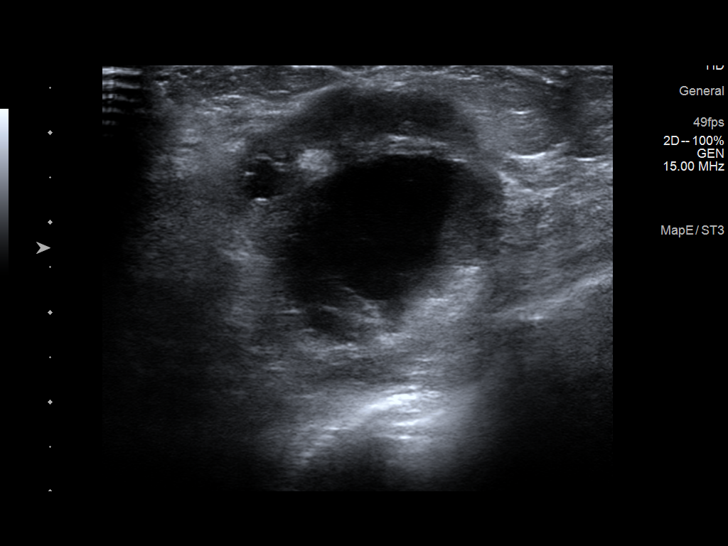
[im 6/11]
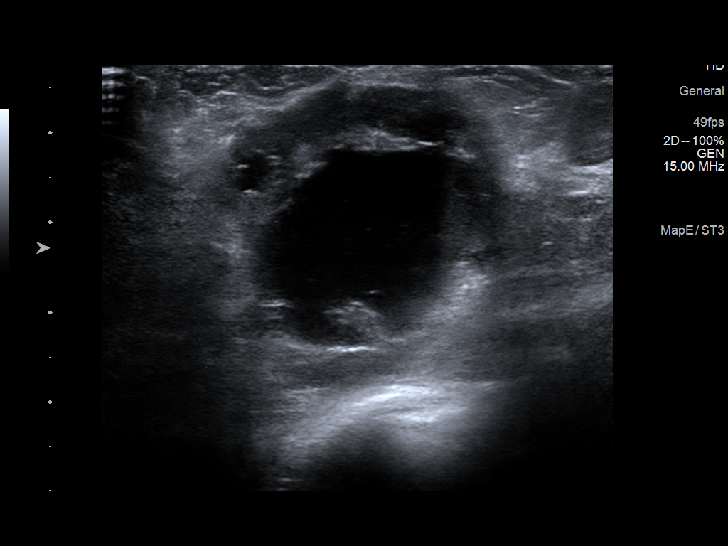
[im 7/11]
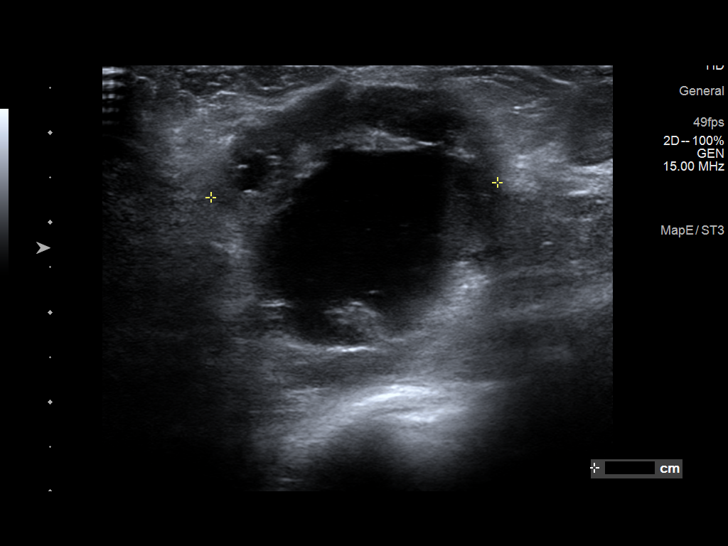
[im 8/11]
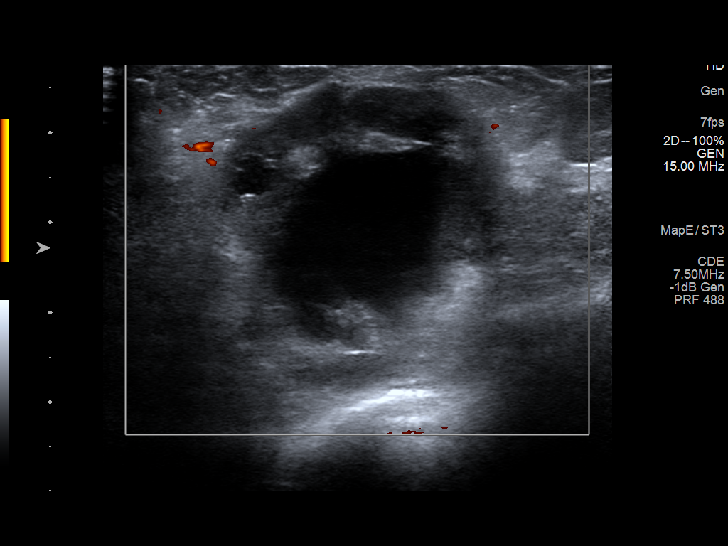
[im 9/11]
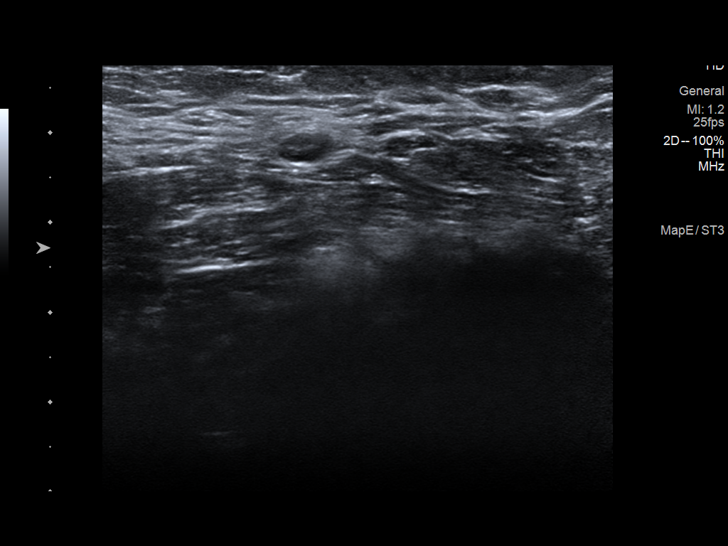
[im 10/11]
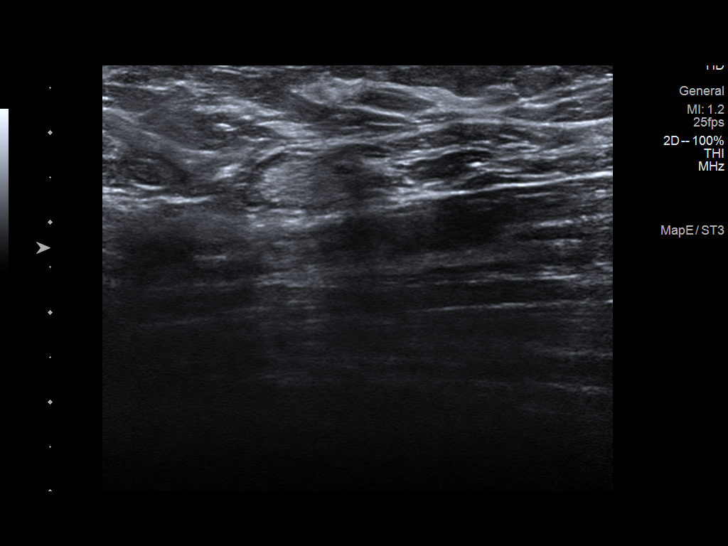
[im 11/11]
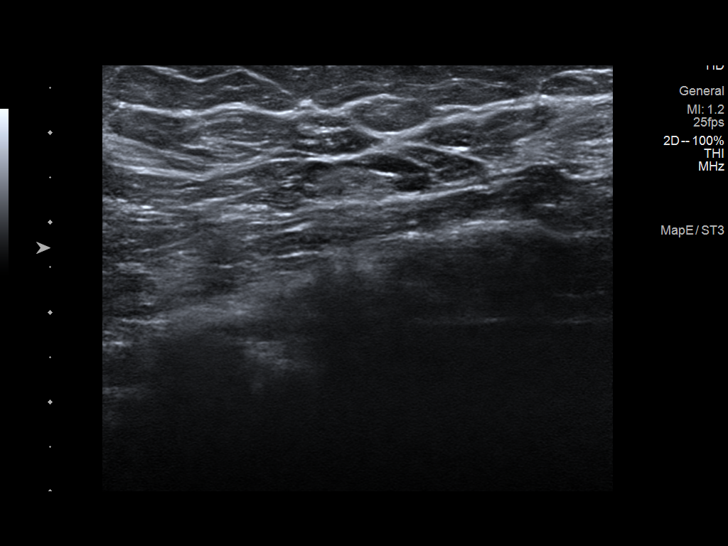

[11 of 11 positions shown; findings below may reference images not displayed]

ACR Breast Density Category c: The breast tissue is heterogeneously
dense, which may obscure small masses.
FINDINGS: 2D/3D full field and spot compression views of the RIGHT breast
demonstrate an obscured mass within the posterior central RIGHT
breast.

No other suspicious findings are noted within either breast.

Mammographic images were processed with CAD.

On physical exam, a firm fixed mass within the central LEFT breast
noted.

Targeted ultrasound is performed, showing a 3.3 x 2.7 x 3.2 cm
complex solid and cystic mass (with peripheral soft tissue
nodularity) within the central RIGHT breast centered at the 6
o'clock position 1 cm from the nipple.

No abnormal RIGHT axillary lymph nodes are identified.
IMPRESSION: Suspicious 3.3 cm complex solid and cystic RIGHT breast mass. Tissue
sampling recommended.

No abnormal RIGHT axillary lymph nodes identified.

RECOMMENDATION:
Ultrasound-guided RIGHT breast biopsy, which will be scheduled.

I have discussed the findings and recommendations with the patient.
If applicable, a reminder letter will be sent to the patient
regarding the next appointment.

BI-RADS CATEGORY  4: Suspicious.

## 2018-12-08 ENCOUNTER — Other Ambulatory Visit: Payer: Self-pay

## 2018-12-08 ENCOUNTER — Ambulatory Visit
Admission: RE | Admit: 2018-12-08 | Discharge: 2018-12-08 | Disposition: A | Discharge status: Home / Self Care | Payer: No Typology Code available for payment source | Source: Ambulatory Visit | Attending: Obstetrics and Gynecology | Admitting: Obstetrics and Gynecology

## 2018-12-08 ENCOUNTER — Ambulatory Visit: Payer: 59 | Admitting: Family Medicine

## 2018-12-08 ENCOUNTER — Other Ambulatory Visit (HOSPITAL_COMMUNITY)
Admission: RE | Admit: 2018-12-08 | Discharge: 2018-12-08 | Disposition: A | Discharge status: Home / Self Care | Payer: Medicaid Other | Source: Ambulatory Visit | Attending: Diagnostic Radiology | Admitting: Diagnostic Radiology

## 2018-12-08 DIAGNOSIS — N6310 Unspecified lump in the right breast, unspecified quadrant: Secondary | ICD-10-CM

## 2018-12-08 DIAGNOSIS — N6001 Solitary cyst of right breast: Secondary | ICD-10-CM | POA: Diagnosis present

## 2018-12-08 DIAGNOSIS — C50311 Malignant neoplasm of lower-inner quadrant of right female breast: Secondary | ICD-10-CM | POA: Insufficient documentation

## 2018-12-08 HISTORY — PX: BREAST BIOPSY: SHX20

## 2018-12-09 LAB — CYTOLOGY - NON PAP

## 2018-12-10 ENCOUNTER — Telehealth (HOSPITAL_COMMUNITY): Payer: Self-pay | Admitting: *Deleted

## 2018-12-10 NOTE — Telephone Encounter (Signed)
Received call from the [facility] that patients breast biopsy came back positive for breast cancer. Called patient and discussed [facility] Medicaid and the information needed for her application. Patient is scheduled to come to [facility] clinic at the [facility] next Wednesday 12/17/2018. Patient stated she has not been given appointment time. Told patient to call me back when appointment is confirmed and would schedule a time to complete her Medicaid paperwork. Patient verbalized understanding.

## 2018-12-11 ENCOUNTER — Telehealth: Payer: Self-pay | Admitting: Hematology and Oncology

## 2018-12-11 NOTE — Telephone Encounter (Signed)
Spoke with patient to confirm morning [facility] appointment for 11/11, packet emailed to patient

## 2018-12-12 ENCOUNTER — Encounter: Payer: Self-pay | Admitting: *Deleted

## 2018-12-12 ENCOUNTER — Other Ambulatory Visit: Payer: Self-pay | Admitting: *Deleted

## 2018-12-12 DIAGNOSIS — Z171 Estrogen receptor negative status [ER-]: Secondary | ICD-10-CM

## 2018-12-12 DIAGNOSIS — C50311 Malignant neoplasm of lower-inner quadrant of right female breast: Secondary | ICD-10-CM | POA: Insufficient documentation

## 2018-12-13 LAB — AEROBIC/ANAEROBIC CULTURE W GRAM STAIN (SURGICAL/DEEP WOUND): Culture: NO GROWTH
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
## 2018-12-17 ENCOUNTER — Inpatient Hospital Stay: Payer: Medicaid Other

## 2018-12-17 ENCOUNTER — Other Ambulatory Visit: Payer: Self-pay | Admitting: *Deleted

## 2018-12-17 ENCOUNTER — Encounter: Payer: Self-pay | Admitting: Genetic Counselor

## 2018-12-17 ENCOUNTER — Ambulatory Visit
Admission: RE | Admit: 2018-12-17 | Discharge: 2018-12-17 | Disposition: A | Discharge status: Home / Self Care | Payer: Self-pay | Source: Ambulatory Visit | Attending: Radiation Oncology | Admitting: Radiation Oncology

## 2018-12-17 ENCOUNTER — Inpatient Hospital Stay: Payer: Medicaid Other | Attending: Hematology and Oncology | Admitting: Hematology and Oncology

## 2018-12-17 ENCOUNTER — Ambulatory Visit (HOSPITAL_BASED_OUTPATIENT_CLINIC_OR_DEPARTMENT_OTHER): Payer: Medicaid Other | Admitting: Genetic Counselor

## 2018-12-17 ENCOUNTER — Ambulatory Visit: Payer: Medicaid Other | Attending: Surgery | Admitting: Physical Therapy

## 2018-12-17 ENCOUNTER — Encounter: Payer: Self-pay | Admitting: Physical Therapy

## 2018-12-17 ENCOUNTER — Encounter: Payer: Self-pay | Admitting: Medical Oncology

## 2018-12-17 ENCOUNTER — Encounter: Payer: Self-pay | Admitting: Hematology and Oncology

## 2018-12-17 ENCOUNTER — Other Ambulatory Visit: Payer: Self-pay

## 2018-12-17 VITALS — BP 117/82 | HR 74 | Temp 98.5°F | Resp 18 | Ht 65.0 in | Wt 139.8 lb

## 2018-12-17 DIAGNOSIS — R293 Abnormal posture: Secondary | ICD-10-CM | POA: Insufficient documentation

## 2018-12-17 DIAGNOSIS — Z90710 Acquired absence of both cervix and uterus: Secondary | ICD-10-CM | POA: Diagnosis not present

## 2018-12-17 DIAGNOSIS — C50911 Malignant neoplasm of unspecified site of right female breast: Secondary | ICD-10-CM | POA: Diagnosis not present

## 2018-12-17 DIAGNOSIS — Z171 Estrogen receptor negative status [ER-]: Secondary | ICD-10-CM

## 2018-12-17 DIAGNOSIS — Z7982 Long term (current) use of aspirin: Secondary | ICD-10-CM | POA: Diagnosis not present

## 2018-12-17 DIAGNOSIS — C50311 Malignant neoplasm of lower-inner quadrant of right female breast: Secondary | ICD-10-CM

## 2018-12-17 DIAGNOSIS — Z808 Family history of malignant neoplasm of other organs or systems: Secondary | ICD-10-CM | POA: Diagnosis not present

## 2018-12-17 DIAGNOSIS — Z806 Family history of leukemia: Secondary | ICD-10-CM

## 2018-12-17 DIAGNOSIS — Z5111 Encounter for antineoplastic chemotherapy: Secondary | ICD-10-CM | POA: Diagnosis not present

## 2018-12-17 DIAGNOSIS — E039 Hypothyroidism, unspecified: Secondary | ICD-10-CM | POA: Insufficient documentation

## 2018-12-17 DIAGNOSIS — M6281 Muscle weakness (generalized): Secondary | ICD-10-CM | POA: Diagnosis not present

## 2018-12-17 DIAGNOSIS — Z5189 Encounter for other specified aftercare: Secondary | ICD-10-CM | POA: Insufficient documentation

## 2018-12-17 DIAGNOSIS — F419 Anxiety disorder, unspecified: Secondary | ICD-10-CM | POA: Insufficient documentation

## 2018-12-17 DIAGNOSIS — Z79899 Other long term (current) drug therapy: Secondary | ICD-10-CM | POA: Insufficient documentation

## 2018-12-17 DIAGNOSIS — Z800 Family history of malignant neoplasm of digestive organs: Secondary | ICD-10-CM

## 2018-12-17 DIAGNOSIS — Z801 Family history of malignant neoplasm of trachea, bronchus and lung: Secondary | ICD-10-CM

## 2018-12-17 DIAGNOSIS — Z1379 Encounter for other screening for genetic and chromosomal anomalies: Secondary | ICD-10-CM | POA: Diagnosis not present

## 2018-12-17 LAB — CBC WITH DIFFERENTIAL (CANCER CENTER ONLY)
Abs Immature Granulocytes: 0.01 10*3/uL (ref 0.00–0.07)
Basophils Absolute: 0 10*3/uL (ref 0.0–0.1)
Basophils Relative: 1 %
Eosinophils Absolute: 0.1 10*3/uL (ref 0.0–0.5)
Eosinophils Relative: 1 %
HCT: 43.3 % (ref 36.0–46.0)
Hemoglobin: 14.3 g/dL (ref 12.0–15.0)
Immature Granulocytes: 0 %
Lymphocytes Relative: 25 %
Lymphs Abs: 1.8 10*3/uL (ref 0.7–4.0)
MCH: 29.4 pg (ref 26.0–34.0)
MCHC: 33 g/dL (ref 30.0–36.0)
MCV: 89.1 fL (ref 80.0–100.0)
Monocytes Absolute: 0.3 10*3/uL (ref 0.1–1.0)
Monocytes Relative: 5 %
Neutro Abs: 4.9 10*3/uL (ref 1.7–7.7)
Neutrophils Relative %: 68 %
Platelet Count: 309 10*3/uL (ref 150–400)
RBC: 4.86 MIL/uL (ref 3.87–5.11)
RDW: 12.4 % (ref 11.5–15.5)
WBC Count: 7.1 10*3/uL (ref 4.0–10.5)
nRBC: 0 % (ref 0.0–0.2)

## 2018-12-17 LAB — CMP (CANCER CENTER ONLY)
ALT: 18 U/L (ref 0–44)
AST: 17 U/L (ref 15–41)
Albumin: 4.2 g/dL (ref 3.5–5.0)
Alkaline Phosphatase: 90 U/L (ref 38–126)
Anion gap: 10 (ref 5–15)
BUN: 17 mg/dL (ref 6–20)
CO2: 26 mmol/L (ref 22–32)
Calcium: 9.8 mg/dL (ref 8.9–10.3)
Chloride: 107 mmol/L (ref 98–111)
Creatinine: 1.09 mg/dL — ABNORMAL HIGH (ref 0.44–1.00)
GFR, Est AFR Am: >60 mL/min (ref >60)
GFR, Estimated: 57 mL/min — ABNORMAL LOW (ref >60)
Glucose, Bld: 93 mg/dL (ref 70–99)
Potassium: 4.4 mmol/L (ref 3.5–5.1)
Sodium: 143 mmol/L (ref 135–145)
Total Bilirubin: 0.2 mg/dL — ABNORMAL LOW (ref 0.3–1.2)
Total Protein: 7.6 g/dL (ref 6.5–8.1)

## 2018-12-17 MED ORDER — LIDOCAINE-PRILOCAINE 2.5-2.5 % EX CREA: TOPICAL_CREAM | CUTANEOUS | 3 refills | Status: DC

## 2018-12-17 MED ORDER — PROCHLORPERAZINE MALEATE 10 MG PO TABS: 10.0000 mg | ORAL_TABLET | Freq: Four times a day (QID) | ORAL | 1 refills | Status: DC | PRN

## 2018-12-17 MED ORDER — LORAZEPAM 0.5 MG PO TABS: 0.5000 mg | ORAL_TABLET | Freq: Every evening | ORAL | 0 refills | Status: DC | PRN

## 2018-12-17 MED ORDER — DEXAMETHASONE 4 MG PO TABS: ORAL_TABLET | ORAL | 0 refills | Status: DC

## 2018-12-17 MED ORDER — ONDANSETRON HCL 8 MG PO TABS: 8.0000 mg | ORAL_TABLET | Freq: Two times a day (BID) | ORAL | 1 refills | Status: DC | PRN
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
## 2018-12-17 NOTE — Progress Notes (Signed)
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
## 2018-12-17 NOTE — Assessment & Plan Note (Signed)
12/12/2018:Patient palpated right breast mass. Mammogram showed a 3.3cm solid and cystic right breast mass, 6 o'clock position, no abnormal right axillary lymph nodes. 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 T2 N0 stage IIb clinical stage  Pathology and radiology counseling: Discussed with the patient, the details of pathology including the type of breast cancer,the clinical staging, the significance of ER, PR and HER-2/neu receptors and the implications for treatment. After reviewing the pathology in detail, we proceeded to discuss the different treatment options between surgery, radiation, chemotherapy, antiestrogen therapies.  Treatment plan: 1.  Neoadjuvant chemotherapy with dose dense Adriamycin and Cytoxan x4 followed by Taxol weekly x12 2.  Breast conserving surgery with sentinel lymph node biopsy 3.  Adjuvant radiation  Pathology and radiology counseling: Discussed with the patient, the details of pathology including the type of breast cancer,the clinical staging, the significance of ER, PR and HER-2/neu receptors and the implications for treatment. After reviewing the pathology in detail, we proceeded to discuss the different treatment options between surgery, radiation, chemotherapy, antiestrogen therapies.  Recommendation based on multidisciplinary tumor board: 1. Neoadjuvant chemotherapy with Adriamycin and Cytoxan dose dense 4 followed by Taxol weekly 12 2. Followed by breast conserving surgery with sentinel lymph node study vs targeted axillary dissection 3. Followed by adjuvant radiation therapy  Chemotherapy Counseling: I discussed the risks and benefits of chemotherapy including the risks of nausea/ vomiting, risk of infection from low WBC count, fatigue due to chemo or anemia, bruising or bleeding due to low platelets, mouth sores, loss/ change in taste and decreased appetite. Liver and kidney function will be monitored through  out chemotherapy as abnormalities in liver and kidney function may be a side effect of treatment. Cardiac dysfunction due to Adriamycin and neuropathy due to Taxol were discussed in detail. Risk of permanent bone marrow dysfunction due to chemo were also discussed.  Plan: 1. Port placement to be done next Monday 2. Echocardiogram 3. Chemotherapy class 4. Breast MRI  Genetic counseling will also be arranged    Return to clinic in 1-2 weeks to start chemotherapy.

## 2018-12-17 NOTE — Progress Notes (Signed)
UPBEAT: Referral Patient in clinic today for [facility] with friend. Dr. Lindi Adie referred patient to study. I met with patient and spouse to introduce the study to them. Patient confirms Dr. Lindi Adie discussed with her the purpose of the study and the patient has expressed interest in participating. I gave patient a detailed explanation of what the study is about and the assessments involved. Patient was informed that all the baseline assessments need to be completed prior to start of her chemotherapy. I inquired with patient if she was claustrophobic and patient stated she is not. Patient does confirm having 2 metal rods in her right arm and she isn't sure whether they are titanium or not. I informed patient that we will need to confirm that the rods are titanium, for MRI purposes. Patient confirms that she is able to hold her breat for 10 seconds, she is able to walk at least two blocks without chest pain, dyspnea, shortness of breath or fainting, and is able to exercise on a treadmill. I informed patient that while she is reviewing the study consent and authorization forms I will be checking for study eligibility. Patient inquired if the cardiac MRI's could be scheduled for after five or on the weekends, I informed patient that I will check with the MRI department and let her know. All patient's questions answered to her satisfaction. Patient was provided with a study information flier, a copy of the study informed consent and authorization forms for her review, as well as my contact information for any questions she may have. Patient thanked for her time and interest in study and were encouraged to contact Dr. Lindi Adie or myself with any questions they may have. Patient and I made plans for me to follow-up with her next week.  [name], RN, BSN, [facility] Clinical Research 12/17/2018 2:00 PM

## 2018-12-17 NOTE — Patient Instructions (Signed)
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
## 2018-12-19 ENCOUNTER — Telehealth: Payer: Self-pay | Admitting: Medical Oncology

## 2018-12-19 ENCOUNTER — Other Ambulatory Visit (HOSPITAL_COMMUNITY): Payer: Self-pay | Admitting: Hematology and Oncology

## 2018-12-19 DIAGNOSIS — Z006 Encounter for examination for normal comparison and control in clinical research program: Secondary | ICD-10-CM

## 2018-12-19 NOTE — Telephone Encounter (Signed)
UPBEAT: Referral Return call from patient this afternoon. I inquired with patient if she had any questions regarding the study and she inquired as to how many visits and the scheduling of the visits. Patient's questions were answered to her satisfaction. Patient stated that she does wish to participate in study, so we discussed her availability and the length of time it would take to complete the signing of study consent and the baseline assessments. Patient works full-time and wishes to minimize time needed to take off from work. I informed her that I can schedule her for the study cardiac MRI on Tuesday at 4pm (this was the only availability that worked with her already scheduled appointments). I informed patient that we can schedule the consent and baseline assessments on Tuesday, November 17th, after her breast MRI and then she can complete the study cardiac MRI same day at 4pm. Patient agreed to this.   Patient also had many questions regarding her chemotherapy treatment and having a mastectomy first vs chemotherapy first. I tried to answer patient's questions but felt that the patient should speak to Dr. Lindi Adie regarding her concerns. I informed her that Dr. Lindi Adie is gone for the day, but that I will send him a message, so that he is aware of all these concerns. Patient gave verbal understanding and thanked me. Patient will be here for chemo education Monday morning at 0800, patient was informed that the chemo-ed nurse will also be able to answer her questions/concerns at that time as well.  Patient thanked for her time and interest in study and was encouraged to call Dr. Lindi Adie or myself with further questions or concerns. Message sent to Dr. Lindi Adie via inbox.  [name], RN, BSN, [facility] Clinical Research 12/19/2018 2:22 PM

## 2018-12-19 NOTE — Telephone Encounter (Signed)
UPBEAT: Referral Tried to call patient to follow up with her regarding referral to study. Patient's VM full and was prompted to send an SMS. I sent an SMS to patient and provided my contact number for call back.

## 2018-12-22 ENCOUNTER — Inpatient Hospital Stay: Payer: Medicaid Other

## 2018-12-22 ENCOUNTER — Encounter: Payer: Self-pay | Admitting: General Practice

## 2018-12-22 ENCOUNTER — Ambulatory Visit (HOSPITAL_COMMUNITY)
Admission: RE | Admit: 2018-12-22 | Discharge: 2018-12-22 | Disposition: A | Discharge status: Home / Self Care | Payer: Medicaid Other | Source: Ambulatory Visit | Attending: Hematology and Oncology | Admitting: Hematology and Oncology

## 2018-12-22 ENCOUNTER — Encounter: Payer: Self-pay | Admitting: Medical Oncology

## 2018-12-22 ENCOUNTER — Other Ambulatory Visit: Payer: Self-pay

## 2018-12-22 DIAGNOSIS — Z006 Encounter for examination for normal comparison and control in clinical research program: Secondary | ICD-10-CM | POA: Insufficient documentation

## 2018-12-22 DIAGNOSIS — N6002 Solitary cyst of left breast: Secondary | ICD-10-CM | POA: Insufficient documentation

## 2018-12-22 DIAGNOSIS — C50311 Malignant neoplasm of lower-inner quadrant of right female breast: Secondary | ICD-10-CM | POA: Diagnosis not present

## 2018-12-22 DIAGNOSIS — Z171 Estrogen receptor negative status [ER-]: Secondary | ICD-10-CM | POA: Diagnosis not present

## 2018-12-22 NOTE — Progress Notes (Signed)
UPBEAT: Referral I briefly met with patient this morning, following the completion of her chemo ed class. Patient and I confirmed her research appointments for tomorrow, patient denied questions at this time, and was encouraged to call me with questions in the meantime. Patient thanked for her time and support of study.  [name], RN, BSN, [facility] Clinical Research 12/22/2018 11:00 AM

## 2018-12-22 NOTE — Progress Notes (Signed)
  Echocardiogram 2D Echocardiogram has been performed.  Burnett Kanaris 12/22/2018, 11:05 AM

## 2018-12-22 NOTE — Progress Notes (Signed)
[facility] Psychosocial Distress Screening Spiritual Care  Attempted to reach Kara Pittman by phone following [facility] to introduce [facility] team/resources, reviewing distress screen per protocol.  The patient scored a [unspecified] on the Psychosocial Distress Thermometer which indicates [unspecified]                distress.   ONCBCN DISTRESS SCREENING 12/22/2018  Screening Type Initial Screening  Referral to support programs Yes    Follow up needed: Yes.   Unfortunately, Kara Pittman's voicemail was full. I will phone again later in the week.   [place], [place], [facility] Pager [phone] Voicemail [phone]

## 2018-12-23 ENCOUNTER — Telehealth: Payer: Self-pay

## 2018-12-23 ENCOUNTER — Ambulatory Visit (HOSPITAL_COMMUNITY)
Admission: RE | Admit: 2018-12-23 | Discharge: 2018-12-23 | Disposition: A | Discharge status: Home / Self Care | Payer: Medicaid Other | Source: Ambulatory Visit

## 2018-12-23 ENCOUNTER — Ambulatory Visit (HOSPITAL_COMMUNITY)
Admission: RE | Admit: 2018-12-23 | Discharge: 2018-12-23 | Disposition: A | Discharge status: Home / Self Care | Payer: Medicaid Other | Source: Ambulatory Visit | Attending: Hematology and Oncology | Admitting: Hematology and Oncology

## 2018-12-23 ENCOUNTER — Other Ambulatory Visit: Payer: Self-pay

## 2018-12-23 ENCOUNTER — Encounter: Payer: Self-pay | Admitting: Medical Oncology

## 2018-12-23 ENCOUNTER — Inpatient Hospital Stay: Payer: Medicaid Other | Admitting: Medical Oncology

## 2018-12-23 DIAGNOSIS — Z1379 Encounter for other screening for genetic and chromosomal anomalies: Secondary | ICD-10-CM | POA: Insufficient documentation

## 2018-12-23 DIAGNOSIS — C50311 Malignant neoplasm of lower-inner quadrant of right female breast: Secondary | ICD-10-CM

## 2018-12-23 DIAGNOSIS — Z171 Estrogen receptor negative status [ER-]: Secondary | ICD-10-CM

## 2018-12-23 DIAGNOSIS — Z006 Encounter for examination for normal comparison and control in clinical research program: Secondary | ICD-10-CM

## 2018-12-23 IMAGING — MR MR BREAST BILAT WO/W CM
6 of 14 series · 19 of 48 positions shown · IV contrast (Yes)
Comparison: Previous exam(s).

CLINICAL DATA: 53-year-old female with recently diagnosed invasive
carcinoma with metaplastic features of the right breast.

LABS:  None performed today and site.
EXAM:
BILATERAL BREAST MRI WITH AND WITHOUT CONTRAST
TECHNIQUE: Multiplanar, multisequence MR images of both breasts were obtained
prior to and following the intravenous administration of 6 ml of
Gadavist.

[Series 1: 3 plane loc · axial · 7.0mm · 1.56mm/px · 1 of 25 slices shown]
[im 1/25]
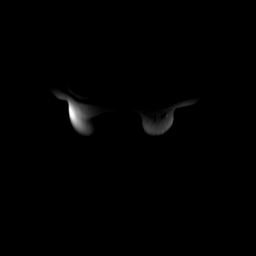

[Series 3: T1 · axial · non-contrast · 1.8mm · 0.59mm/px · z∈[-131,+58]mm · 5 of 212 slices shown]
[im 1/212]
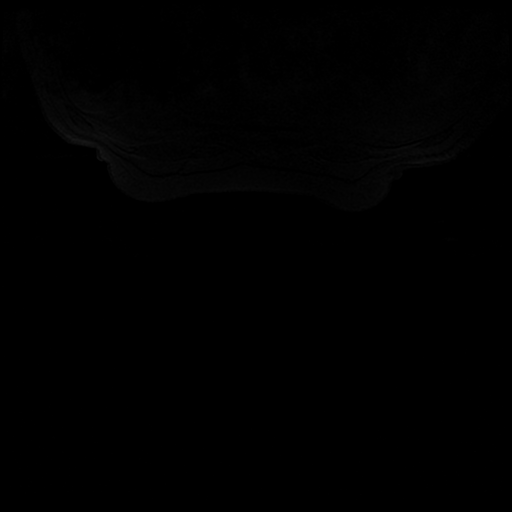
[im 53/212]
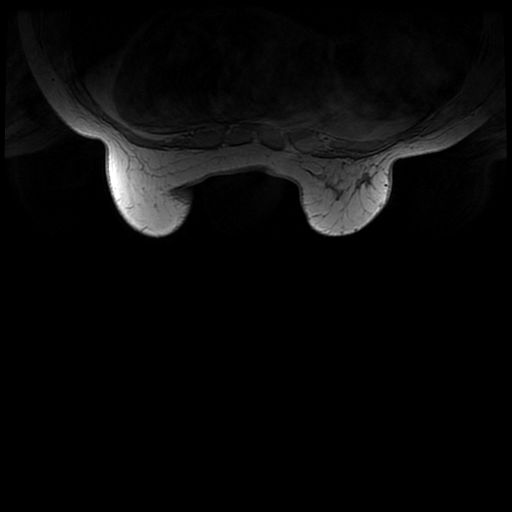
[im 106/212]
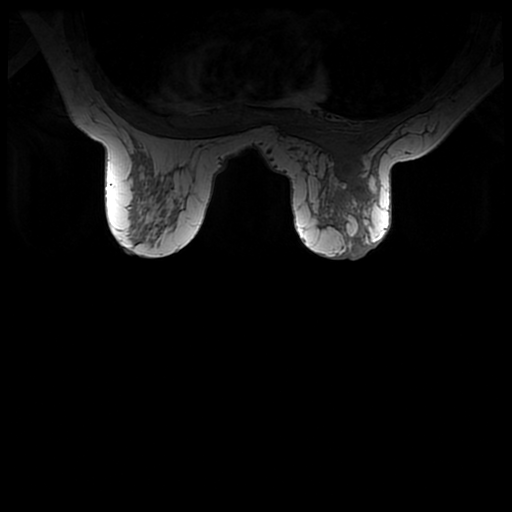
[im 159/212]
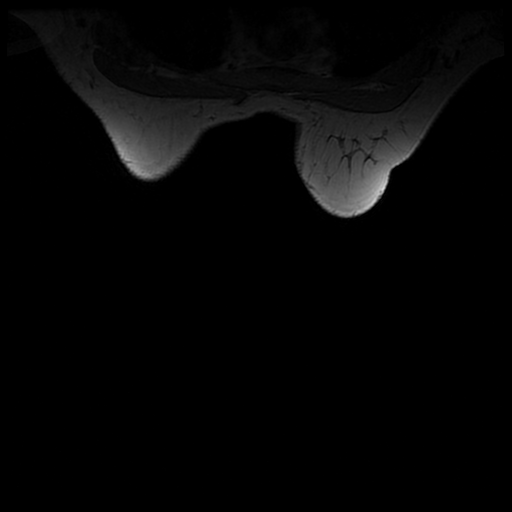
[im 212/212]
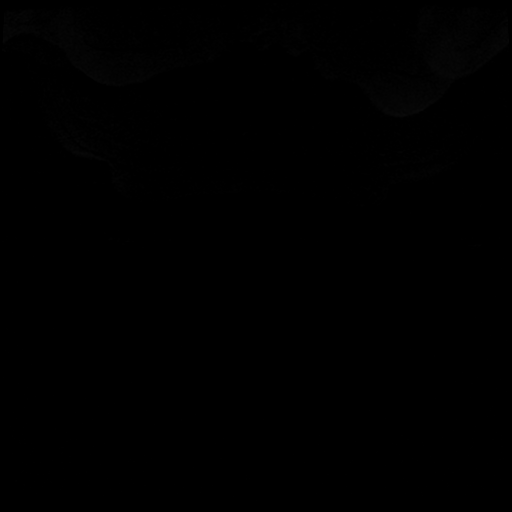

[Series 4: T2 · axial · 3.0mm · 0.59mm/px · 1 of 65 slices shown]
[im 1/65]
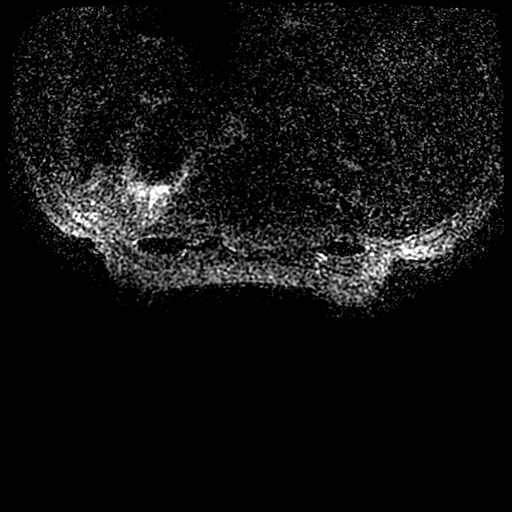

[Series 600: T1 fat-sat · axial · 1.8mm · 0.59mm/px · z∈[-140,+57]mm · 5 of 220 slices shown (1 of 3)]
[im 1/220]
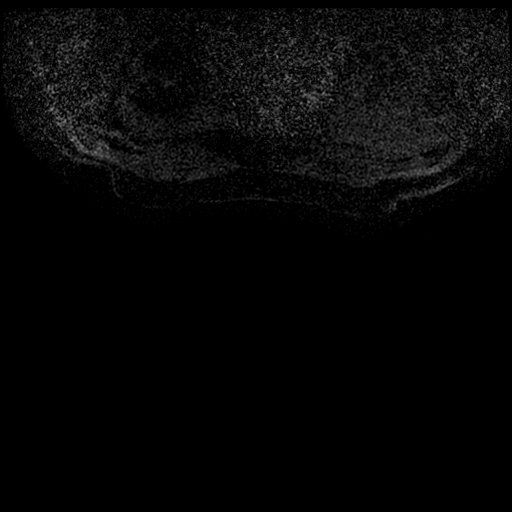
[im 55/220]
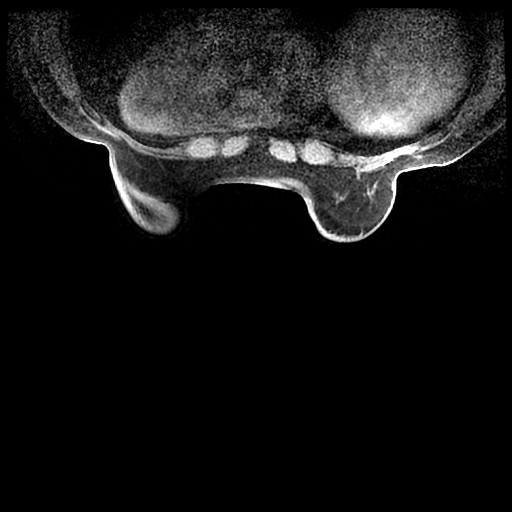
[im 110/220]
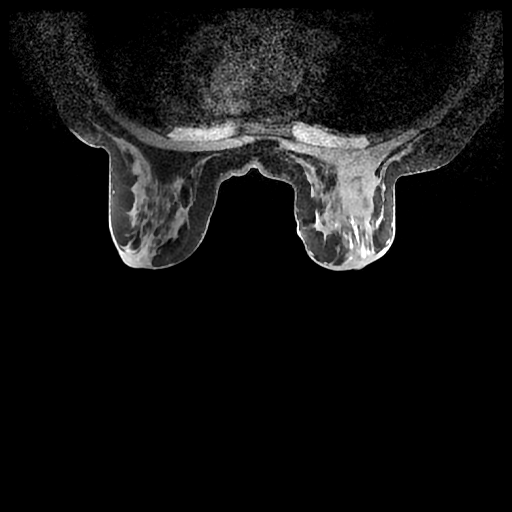
[im 165/220]
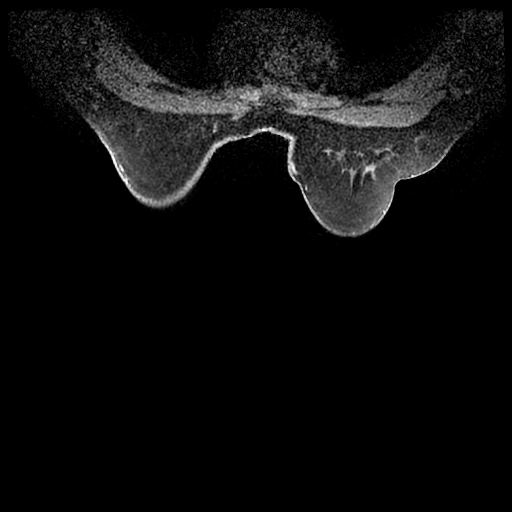
[im 220/220]
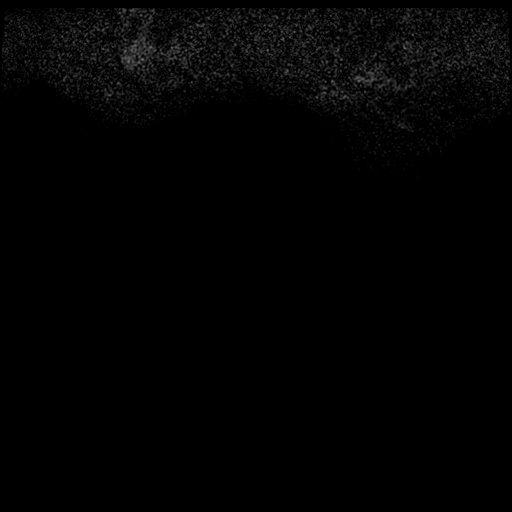

[Series 601: T1 fat-sat · axial · 1.8mm · 0.59mm/px · z∈[-140,+57]mm · 5 of 220 slices shown (2 of 3)]
[im 1/220]
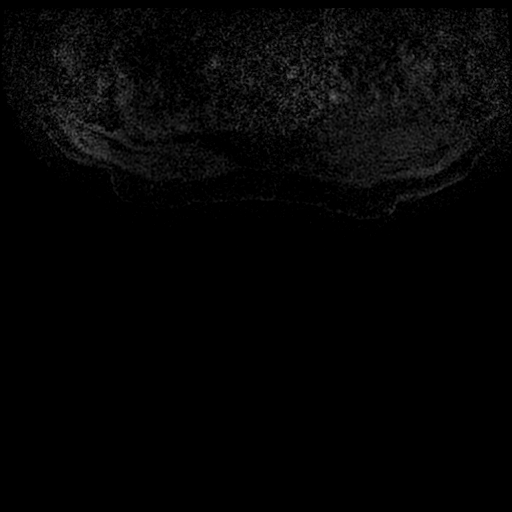
[im 55/220]
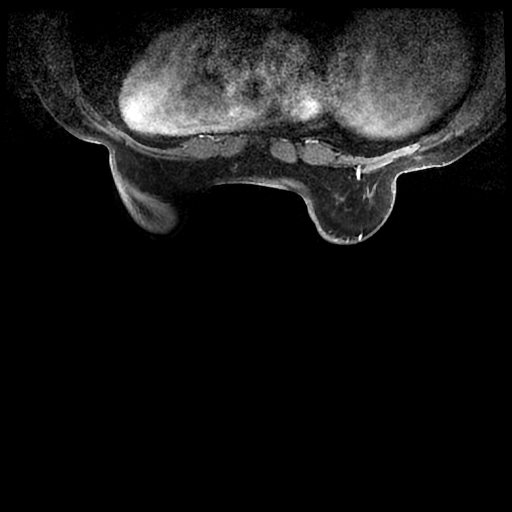
[im 110/220]
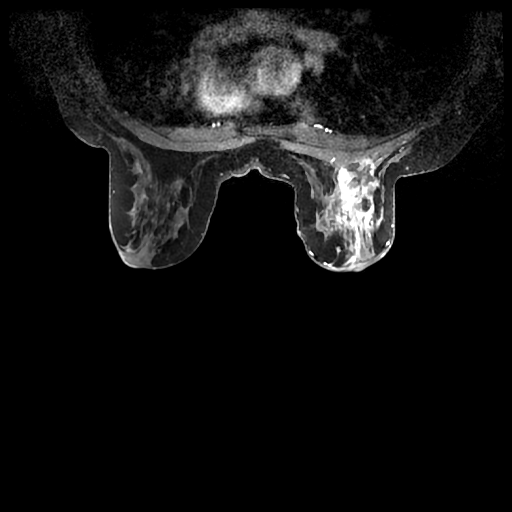
[im 165/220]
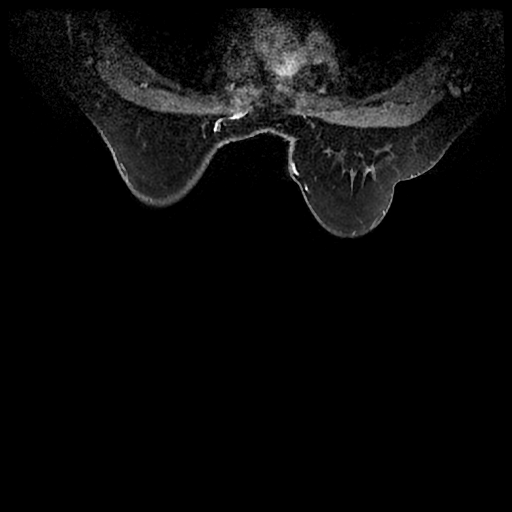
[im 220/220]
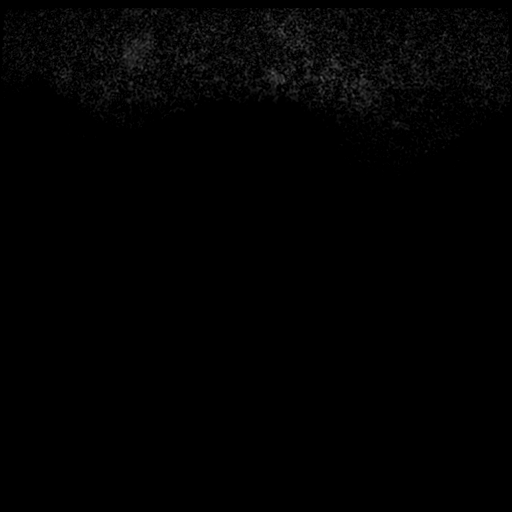

[Series 602: T1 fat-sat · axial · 1.8mm · 0.59mm/px · z∈[-140,-91]mm · 2 of 220 slices shown (3 of 3)]
[im 1/220]
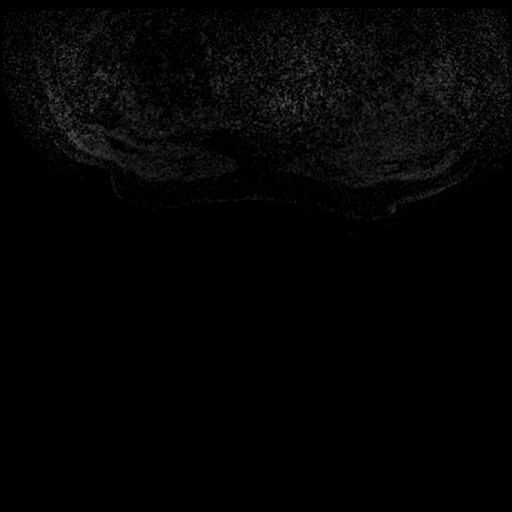
[im 55/220]
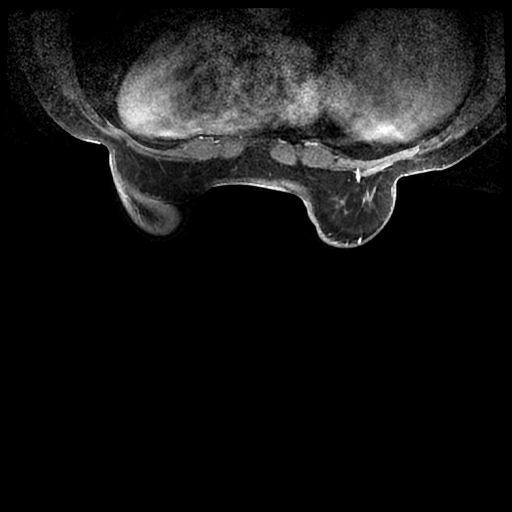

[19 of 48 positions shown; findings below may reference images not displayed]

Three-dimensional MR images were rendered by post-processing of the
original MR data on an independent workstation. The
three-dimensional MR images were interpreted, and findings are
reported in the following complete MRI report for this study. Three
dimensional images were evaluated at the independent Dyna[employer]ad
workstation
FINDINGS: Breast composition: c. Heterogeneous fibroglandular tissue.

Background parenchymal enhancement: Mild.

Right breast: There is a complex solid and cystic enhancing mass
involving the majority of the lower outer quadrant of the right
breast. Surrounding non-mass enhancement also involves portions of
the lower inner quadrant and upper outer quadrant. Overall
measurements of findings are 6 x 3.4 x 4 cm (AP by transverse by
craniocaudal dimensions).

These findings span from anterior to posterior depth with
involvement of the underlying pectoralis muscle. This includes
morphologic changes as well as mild enhancement of the lateral
portion of the muscle (series 10, image 113/222).

Additionally, there is skin thickening and tethering along the
inferior portion of the breast. Skin thickness and enhancement
measures up to 4 mm.

Left breast: No mass or abnormal enhancement.

Lymph nodes: No abnormal appearing lymph nodes.

Ancillary findings:  None.
IMPRESSION: 1. Complex solid and cystic mass involving the majority of the lower
outer quadrant of the right breast with associated non mass
enhancement extending into portions of the lower inner and upper
outer quadrants. Overall measurements span 6 x 3.4 x 4 cm from
anterior to posterior depth.
2. The mass abuts and indents the underlying pectoralis muscle which
demonstrates morphologic changes and subtle enhancement along the
lateral portions, suggesting malignant involvement.
3. There is skin retraction with thickening and enhancement along
the inferior right breast. Lymphovascular spread of disease cannot
be excluded. Consider skin punch biopsy if this will alter clinical
management.
4. No MRI evidence of malignancy on the left.
5. No suspicious lymphadenopathy.

RECOMMENDATION:
Per clinical treatment plan.c

Consider skin punch biopsy for right breast skin thickening if this
will alter clinical management.

BI-RADS CATEGORY  6: Known biopsy-proven malignancy.

## 2018-12-23 MED ORDER — GADOBUTROL 1 MMOL/ML IV SOLN
6.0000 mL | Freq: Once | INTRAVENOUS | Status: AC | PRN
  Administered 2018-12-23: 6 mL via INTRAVENOUS

## 2018-12-23 NOTE — Telephone Encounter (Signed)
Nutrition  Patient attended Breast Clinic on 12/17/2018 and was given nutrition packet by nurse navigator.    Called to introduce self and service at [facility] but no answer and mailbox full.  Patient has been given contact information in packet.  RD available if needed.   [name] B. [name], [place], [place] Registered Dietitian [phone] (pager)

## 2018-12-23 NOTE — Progress Notes (Signed)
UPBEAT: Consent and Baseline Assessments  Consent: I met with patient this afternoon in the MRI department. Patient was scheduled for a breast MRI and I received a call from [place], [place], regarding patient's cardiac MRI for study. I was informed that once patient received IV contrast for the breast MRI, she would not be able to complete the cardiac MRI for study that was scheduled this afternoon. Marjory Lies asked if both could be completed while patient was there and that they would complete the study cardiac MRI first. I informed Marjory Lies that I needed to consent the patient first, prior to any study procedures collected. Patient wanted to complete her appointments today and asked if I could consent her now, before both MRI assessments started. I proceeded to go to the MRI department and patient was waiting there for me to consent her. I inquired with patient if she had any questions prior to [place] starting and patient denied having any. Patient and I reviewed the consent and authorization forms page by page. Patient confirms understanding: that participation in study is voluntary and that she can withdraw at any time, the purpose of the study, the length of participation in the study, tests and procedures, possible risks/benefits of taking part, any costs to her, how her medical information will be kept private, what organizations may have access to her information as is listed in the consent form, and the optional sample collections for laboratory studies and/or biobanking for possible future studies. After all of patient's questions were answered to her satisfaction, patient proceeded to sign and date Protocol Version Date: 10/06/18 consent and authorization forms where indicated. Patient does not give  consent to the Optional Sample Collections for Laboratory Studies and/or Biobanking for Possible Future Studies.  Patient was provided with copies of both forms for her records.   Second nurse eligibility  verification completed by research nurse, Foye Spurling. Dr. Lindi Adie confirms patient eligible for study. Patient was enrolled to study after signing of consent.   Baseline Assessments: After the study consent and authorization forms were signed by the patient, patient proceeded to have her baseline cardiac MRI completed.  PROs: Questionnaires were completed on her own and checked for completion. Depression Screening Score= 18 and Dr. Lindi Adie was informed  Via a priority in-basket message. Per study, a score of 10 or greater is considered depressed and must notify the MD to determine if further evaluation is necessary.  Neurocognitive Testing: Patient completed the baseline neurocognitive assessment with research specialist, Remer Macho,  Physical Functions Testing: Baseline CRF 24 of the SPPB was completed by this nurse.  Plan: I explained to the patient that her baseline labs and vital signs, along with a waist measurement will be collected when she is in clinic for her first chemotherapy treatment, prior to the start of it and at that time with all the completion of her baseline assessments she will be dispensed with the study provided $25 gift card. Patient gave her verbal understanding. Patient was also informed that once the labs and cardiac MRI results are in, she will be informed of them. Patient was thanked for her time and support of study today. All patient's questions answered to her satisfaction and patient was encouraged to call Dr. Lindi Adie or myself with any questions/concerns she may have.  [name], RN, BSN, [facility] Clinical Research 12/23/2018 5:14 PM

## 2018-12-23 NOTE — Progress Notes (Signed)
DCP-001 Dr. Lindi Adie referred patient to UPBEAT and patient is eligible for DCP-001.  Patient was provided with copies of the DCP-001 consent and authorization forms for the Use of a Clinical Trial Screening Tool to Address Cancer Health Disparities in the [place] Program project. I explained to the patient that eligibility for study includes participants regardless of their decision to participate in an NCI clinical trial. The patient is aware that this involves a one-time consent and collection of demographic variables, with the majority of data collected from their medical record. It was noted to the patient, that no identifiers are being reported via the screening tool. I encouraged patient to review the forms provided and to call me with any questions she may have. Patient was thanked for her time and she has my contact information to call me in the meantime.  [name], RN, BSN, [facility] Clinical Research 12/23/2018 5:21 PM

## 2018-12-24 ENCOUNTER — Other Ambulatory Visit: Payer: Self-pay | Admitting: Hematology and Oncology

## 2018-12-24 ENCOUNTER — Ambulatory Visit: Payer: Self-pay | Admitting: Genetic Counselor

## 2018-12-24 ENCOUNTER — Encounter: Payer: Self-pay | Admitting: Genetic Counselor

## 2018-12-24 ENCOUNTER — Telehealth: Payer: Self-pay | Admitting: Genetic Counselor

## 2018-12-24 DIAGNOSIS — Z1379 Encounter for other screening for genetic and chromosomal anomalies: Secondary | ICD-10-CM

## 2018-12-24 NOTE — Telephone Encounter (Signed)
Revealed negative genetic testing.  Discussed that we do not know why she has breast cancer or why there is cancer in the family.  It could be due to a different gene that we are not testing, or our current technology may not be able detect something.  It will be important for her to keep in contact with genetics to keep up with whether additional testing may be appropriate in the future.   Testing did detect a variant of uncertain significance in the BARD1 gene, called c.26_40dup. Her result is still considered normal, and this variant should not impact her medical management.

## 2018-12-24 NOTE — Progress Notes (Signed)
HPI:  [name] was previously seen in the [facility] clinic due to a personal history of triple negative breast cancer and concerns regarding a hereditary predisposition to cancer. Please refer to our prior cancer genetics clinic note for more information regarding our discussion, assessment and recommendations, at the time. [name] recent genetic test results were disclosed to her, as were recommendations warranted by these results. These results and recommendations are discussed in more detail below.  CANCER HISTORY:  Oncology History  Malignant neoplasm of lower-inner quadrant of right breast of female, estrogen receptor negative ([place])  12/12/2018 Initial Diagnosis   Patient palpated right breast mass. Mammogram showed a 3.3cm solid and cystic right breast mass, 6 o'clock position, no abnormal right axillary lymph nodes. 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    12/17/2018 Cancer Staging   Staging form: Breast, AJCC 8th Edition - Clinical stage from 12/17/2018: Stage IIB (cT2, cN0, cM0, G3, ER-, PR-, HER2-) - Signed by [name], MD on 12/17/2018   12/23/2018 Genetic Testing   Negative genetic testing:  No pathogenic variants detected on the [facility] Multi-Cancer panel. A variant of uncertain significance was detected in the BARD1 gene called c.26_40dup. The report date is 12/23/2018.  The Multi-Cancer Panel offered by [facility] includes sequencing and/or deletion duplication testing of the following 85 genes: AIP, ALK, APC, ATM, AXIN2,BAP1,  BARD1, BLM, BMPR1A, BRCA1, BRCA2, BRIP1, CASR, CDC73, CDH1, CDK4, CDKN1B, CDKN1C, CDKN2A (p14ARF), CDKN2A (p16INK4a), CEBPA, CHEK2, CTNNA1, DICER1, DIS3L2, EGFR (c.2369C>T, p.Thr790Met variant only), EPCAM (Deletion/duplication testing only), FH, FLCN, GATA2, GPC3, GREM1 (Promoter region deletion/duplication testing only), HOXB13 (c.251G>A, p.Gly84Glu), HRAS, KIT, MAX, MEN1, MET,  MITF (c.952G>A, p.Glu318Lys variant only), MLH1, MSH2, MSH3, MSH6, MUTYH, NBN, NF1, NF2, NTHL1, PALB2, PDGFRA, PHOX2B, PMS2, POLD1, POLE, POT1, PRKAR1A, PTCH1, PTEN, RAD50, RAD51C, RAD51D, RB1, RECQL4, RET, RNF43, RUNX1, SDHAF2, SDHA (sequence changes only), SDHB, SDHC, SDHD, SMAD4, SMARCA4, SMARCB1, SMARCE1, STK11, SUFU, TERC, TERT, TMEM127, TP53, TSC1, TSC2, VHL, WRN and WT1.     12/25/2018 -  Chemotherapy   The patient had DOXOrubicin (ADRIAMYCIN) chemo injection 102 mg, 60 mg/m2 = 102 mg, Intravenous,  Once, 0 of 4 cycles palonosetron (ALOXI) injection 0.25 mg, 0.25 mg, Intravenous,  Once, 0 of 8 cycles pegfilgrastim-jmdb (FULPHILA) injection 6 mg, 6 mg, Subcutaneous,  Once, 0 of 4 cycles CARBOplatin (PARAPLATIN) 500.4 mg in sodium chloride 0.9 % 250 mL chemo infusion, 500.4 mg (100 % of original dose 500.4 mg), Intravenous,  Once, 0 of 4 cycles Dose modification:   (original dose 500.4 mg, Cycle 5) cyclophosphamide (CYTOXAN) 1,020 mg in sodium chloride 0.9 % 250 mL chemo infusion, 600 mg/m2 = 1,020 mg, Intravenous,  Once, 0 of 4 cycles PACLitaxel (TAXOL) 138 mg in sodium chloride 0.9 % 250 mL chemo infusion (</= 61m/m2), 80 mg/m2 = 138 mg, Intravenous,  Once, 0 of 4 cycles fosaprepitant (EMEND) 150 mg, dexamethasone (DECADRON) 12 mg in sodium chloride 0.9 % 145 mL IVPB, , Intravenous,  Once, 0 of 8 cycles  for chemotherapy treatment.      FAMILY HISTORY:  We obtained a detailed, 4-generation family history.  Significant diagnoses are listed below: Family History  Problem Relation Age of Onset  . Congestive Heart Failure Mother   . Skin cancer Mother        non-melanoma  . Heart disease Sister   . Heart attack Brother 356 . Heart disease Other   . Skin cancer Father  non-melanoma  . Leukemia Maternal Uncle 46  . Throat cancer Maternal Uncle        diagnosed late 105s  . Melanoma Paternal Aunt   . Cancer Paternal Aunt        salivary gland  . Skin cancer Paternal Uncle         non-melanoma  . Lung cancer Other        paternal great-uncle    [name] has two sons - one age 76 who died earlier this year, and one age 52. She has one sister who is 27, and a brother who died at the age of 73 from a heart attack.   [name] mother is currently 74 and has had non-melanoma skin cancer. She has one maternal aunt and eight maternal uncles. One uncle had throat cancer in his late 53s, and one died at age 29 from leukemia that had been diagnosed around age 33. She notes that there are some individuals who have had non-melanoma skin cancers on the maternal side of the family as well. Her maternal grandparents died in their 19s and 28s from heart problems.  [name]'s father is currently 63 and has had non-melanoma skin cancer. She has one paternal aunt and four paternal uncles. Her aunt has had multiple types of cancer - including melanoma and possibly salivary gland cancer. Some of her uncles have had non-melanoma skin cancer. Her paternal grandparents died in their 38s and 42s from heart problems. She also has a paternal great-aunt who had lung cancer and may have been a smoker.   [name] is unaware of previous family history of genetic testing for hereditary cancer risks.   GENETIC TEST RESULTS: Genetic testing reported out on 12/23/2018 through the [facility] Multi-Cancer panel found no pathogenic variants.   The Multi-Cancer Panel offered by [facility] includes sequencing and/or deletion duplication testing of the following 85 genes: AIP, ALK, APC, ATM, AXIN2,BAP1,  BARD1, BLM, BMPR1A, BRCA1, BRCA2, BRIP1, CASR, CDC73, CDH1, CDK4, CDKN1B, CDKN1C, CDKN2A (p14ARF), CDKN2A (p16INK4a), CEBPA, CHEK2, CTNNA1, DICER1, DIS3L2, EGFR (c.2369C>T, p.Thr790Met variant only), EPCAM (Deletion/duplication testing only), FH, FLCN, GATA2, GPC3, GREM1 (Promoter region deletion/duplication testing only), HOXB13 (c.251G>A, p.Gly84Glu), HRAS, KIT, MAX, MEN1, MET, MITF (c.952G>A,  p.Glu318Lys variant only), MLH1, MSH2, MSH3, MSH6, MUTYH, NBN, NF1, NF2, NTHL1, PALB2, PDGFRA, PHOX2B, PMS2, POLD1, POLE, POT1, PRKAR1A, PTCH1, PTEN, RAD50, RAD51C, RAD51D, RB1, RECQL4, RET, RNF43, RUNX1, SDHAF2, SDHA (sequence changes only), SDHB, SDHC, SDHD, SMAD4, SMARCA4, SMARCB1, SMARCE1, STK11, SUFU, TERC, TERT, TMEM127, TP53, TSC1, TSC2, VHL, WRN and WT1. The test report will be scanned into EPIC and located under the Molecular Pathology section of the Results Review tab.  A portion of the result report is included below for reference.     We discussed with [name] that because current genetic testing is not perfect, it is possible there may be a gene mutation in one of these genes that current testing cannot detect, but that chance is small.  We also discussed, that there could be another gene that has not yet been discovered, or that we have not yet tested, that is responsible for the cancer diagnoses in the family. It is also possible there is a hereditary cause for the cancer in the family that [name] did not inherit and therefore was not identified in her testing.  Therefore, it is important to remain in touch with cancer genetics in the future so that we can continue to offer [name] the most up to  date genetic testing.   Genetic testing did identify a variant of uncertain significance (VUS) was identified in the BARD1 gene called c.26_40dup.  At this time, it is unknown if this variant is associated with increased cancer risk or if this is a normal finding, but most variants such as this get reclassified to being inconsequential. It should not be used to make medical management decisions. With time, we suspect the lab will determine the significance of this variant, if any. If we do learn more about it, we will try to contact [name] to discuss it further. However, it is important to stay in touch with [place] periodically and keep the address and phone number up to date.   ADDITIONAL GENETIC TESTING:  We discussed with [name] that her genetic testing was fairly extensive.  If there are genes identified to increase cancer risk that can be analyzed in the future, we would be happy to discuss and coordinate this testing at that time.    CANCER SCREENING RECOMMENDATIONS: [name]'s test result is considered negative (normal).  This means that we have not identified a hereditary cause for her personal and family history of cancer at this time. Most cancers happen by chance and this negative test suggests that her personal and family of cancer may fall into this category.    While reassuring, this does not definitively rule out a hereditary predisposition to cancer. It is still possible that there could be genetic mutations that are undetectable by current technology. There could be genetic mutations in genes that have not been tested or identified to increase cancer risk.  Therefore, it is recommended she continue to follow the cancer management and screening guidelines provided by her oncology and primary healthcare provider.   An individual's cancer risk and medical management are not determined by genetic test results alone. Overall cancer risk assessment incorporates additional factors, including personal medical history, family history, and any available genetic information that may result in a personalized plan for cancer prevention and surveillance.  RECOMMENDATIONS FOR FAMILY MEMBERS:  Individuals in this family might be at some increased risk of developing cancer, over the general population risk, simply due to the family history of cancer.  We recommended women in this family have a yearly mammogram beginning at age 55, or 10 years younger than the earliest onset of cancer, an annual clinical breast exam, and perform monthly breast self-exams. Women in this family should also have a gynecological exam as recommended by their primary provider. All family members  should have a colonoscopy by age 37.  FOLLOW-UP: Lastly, we discussed with [name] that cancer genetics is a rapidly advancing field and it is possible that new genetic tests will be appropriate for her and/or her family members in the future. We encouraged her to remain in contact with cancer genetics on an annual basis so we can update her personal and family histories and let her know of advances in cancer genetics that may benefit this family.   Our contact number was provided. [name]'s questions were answered to her satisfaction, and she knows she is welcome to call us at anytime with additional questions or concerns.   Clint Guy, MS, [facility] Certified Genetic Counselor [email or web address] Phone: [phone]

## 2018-12-25 ENCOUNTER — Telehealth: Payer: Self-pay | Admitting: *Deleted

## 2018-12-25 NOTE — Telephone Encounter (Signed)
Spoke to pt concerning [place] from 12/17/18. Denies questions or concerns regarding dx or treatment care plan. Discussed timing of hair loss and 1st AC. Encourage pt to call with needs. Received verbal understanding.

## 2018-12-26 ENCOUNTER — Encounter: Payer: Self-pay | Admitting: General Practice

## 2018-12-26 ENCOUNTER — Telehealth: Payer: Self-pay | Admitting: *Deleted

## 2018-12-26 NOTE — Telephone Encounter (Signed)
Left vm for pt to return call to discuss port placement and 1st chemo. Contact information provided.

## 2018-12-26 NOTE — Telephone Encounter (Signed)
Spoke to pt regarding future appts for chemo after port placement on 11/24. Discussed anti-nausea medications as well. Gave instructions for each medication. Denies further needs or questions at this time.

## 2018-12-26 NOTE — Progress Notes (Signed)
[place] [facility] Note  Left voicemail from Patient and [facility], encouraging callback.   [place], [place], [facility] Pager [phone] Voicemail [phone]

## 2018-12-29 ENCOUNTER — Other Ambulatory Visit: Payer: Self-pay | Admitting: Radiology

## 2018-12-29 ENCOUNTER — Telehealth: Payer: Self-pay | Admitting: Oncology

## 2018-12-29 NOTE — Progress Notes (Signed)
Patient Care Team: [name], MD as PCP - General (Family Medicine) [name], RN as Oncology Nurse Navigator [name], RN as Oncology Nurse Navigator [name], MD as Consulting Physician (General Surgery) [name], MD as Consulting Physician (Hematology and Oncology) [name], MD as Consulting Physician (Radiation Oncology) [name], RN as Registered Nurse (Medical Oncology)  DIAGNOSIS:    ICD-10-CM   1. Malignant neoplasm of lower-inner quadrant of right breast of female, estrogen receptor negative ([place])  C50.311    Z17.1     SUMMARY OF ONCOLOGIC HISTORY: Oncology History  Malignant neoplasm of lower-inner quadrant of right breast of female, estrogen receptor negative ([place])  12/12/2018 Initial Diagnosis   Patient palpated right breast mass. Mammogram showed a 3.3cm solid and cystic right breast mass, 6 o'clock position, no abnormal right axillary lymph nodes. 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    12/17/2018 Cancer Staging   Staging form: Breast, AJCC 8th Edition - Clinical stage from 12/17/2018: Stage IIB (cT2, cN0, cM0, G3, ER-, PR-, HER2-) - Signed by [name], MD on 12/17/2018   12/23/2018 Genetic Testing   Negative genetic testing:  No pathogenic variants detected on the [facility] Multi-Cancer panel. A variant of uncertain significance was detected in the BARD1 gene called [id number]_40dup. The report date is 12/23/2018.  The Multi-Cancer Panel offered by [facility] includes sequencing and/or deletion duplication testing of the following 85 genes: AIP, ALK, APC, ATM, AXIN2,BAP1,  BARD1, BLM, BMPR1A, BRCA1, BRCA2, BRIP1, CASR, CDC73, CDH1, CDK4, CDKN1B, CDKN1C, CDKN2A (p14ARF), CDKN2A (p16INK4a), CEBPA, CHEK2, CTNNA1, DICER1, DIS3L2, EGFR (c.2369C>T, p.Thr790Met variant only), EPCAM (Deletion/duplication testing only), FH, FLCN, GATA2, GPC3, GREM1 (Promoter region  deletion/duplication testing only), HOXB13 (c.251G>A, p.Gly84Glu), HRAS, KIT, MAX, MEN1, MET, MITF (c.952G>A, p.Glu318Lys variant only), MLH1, MSH2, MSH3, MSH6, MUTYH, NBN, NF1, NF2, NTHL1, PALB2, PDGFRA, PHOX2B, PMS2, POLD1, POLE, POT1, PRKAR1A, PTCH1, PTEN, RAD50, RAD51C, RAD51D, RB1, RECQL4, RET, RNF43, RUNX1, SDHAF2, SDHA (sequence changes only), SDHB, SDHC, SDHD, SMAD4, SMARCA4, SMARCB1, SMARCE1, STK11, SUFU, TERC, TERT, TMEM127, TP53, TSC1, TSC2, VHL, WRN and WT1.     12/25/2018 -  Chemotherapy   The patient had DOXOrubicin (ADRIAMYCIN) chemo injection 102 mg, 60 mg/m2 = 102 mg, Intravenous,  Once, 0 of 4 cycles palonosetron (ALOXI) injection 0.25 mg, 0.25 mg, Intravenous,  Once, 0 of 8 cycles pegfilgrastim-jmdb (FULPHILA) injection 6 mg, 6 mg, Subcutaneous,  Once, 0 of 4 cycles CARBOplatin (PARAPLATIN) 500.4 mg in sodium chloride 0.9 % 250 mL chemo infusion, 500.4 mg (100 % of original dose 500.4 mg), Intravenous,  Once, 0 of 4 cycles Dose modification:   (original dose 500.4 mg, Cycle 5) cyclophosphamide (CYTOXAN) 1,020 mg in sodium chloride 0.9 % 250 mL chemo infusion, 600 mg/m2 = 1,020 mg, Intravenous,  Once, 0 of 4 cycles PACLitaxel (TAXOL) 138 mg in sodium chloride 0.9 % 250 mL chemo infusion (</= 68m/m2), 80 mg/m2 = 138 mg, Intravenous,  Once, 0 of 4 cycles fosaprepitant (EMEND) 150 mg, dexamethasone (DECADRON) 12 mg in sodium chloride 0.9 % 145 mL IVPB, , Intravenous,  Once, 0 of 8 cycles  for chemotherapy treatment.      CHIEF COMPLIANT: Cycle 1 Adriamycin and Cytoxan  INTERVAL HISTORY: Kara KESSENis a 53y.o. 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 She is currently on neoadjuvant chemotherapy with dose dense Adriamycin and Cytoxan. Echo on 12/22/18 showed an ejection fraction of 60-65%. Breast MRI on 12/23/18 showed  the known right breast mass measuring 6.0cm with no evidence of malignancy in the left breast. Genetic testing was negative. She is a  participant in the [employer]Beat clinical trial. She presents to the clinic today for cycle 1.   REVIEW OF SYSTEMS:   Constitutional: Denies fevers, chills or abnormal weight loss Eyes: Denies blurriness of vision Ears, nose, mouth, throat, and face: Denies mucositis or sore throat Respiratory: Denies cough, dyspnea or wheezes Cardiovascular: Denies palpitation, chest discomfort Gastrointestinal: Denies nausea, heartburn or change in bowel habits Skin: Denies abnormal skin rashes Lymphatics: Denies new lymphadenopathy or easy bruising Neurological: Denies numbness, tingling or new weaknesses Behavioral/Psych: Mood is stable, no new changes  Extremities: No lower extremity edema Breast: denies any pain or lumps or nodules in either breasts All other systems were reviewed with the patient and are negative.  I have reviewed the past medical history, past surgical history, social history and family history with the patient and they are unchanged from previous note.  ALLERGIES:  has No Known Allergies.  MEDICATIONS:  Current Outpatient Medications  Medication Sig Dispense Refill  . aspirin EC 81 MG tablet Take 1 tablet (81 mg total) by mouth daily. (Patient not taking: Reported on 12/17/2018)    . buPROPion (WELLBUTRIN XL) 150 MG 24 hr tablet Take 1 tablet by mouth daily 90 tablet 3  . CYTOMEL 5 MCG tablet Take 1 tablet by mouth once daily 90 tablet 3  . dexamethasone (DECADRON) 4 MG tablet Take 1 tablet day after chemo and 1 tablet 2 days after chemo with food 8 tablet 0  . levothyroxine (SYNTHROID, LEVOTHROID) 50 MCG tablet Take one tab two times per week in addition to the daily dose of 75 micrograms. 25 tablet 3  . lidocaine-prilocaine (EMLA) cream Apply to affected area once 30 g 3  . LORazepam (ATIVAN) 0.5 MG tablet Take 1 tablet (0.5 mg total) by mouth at bedtime as needed for sleep. 30 tablet 0  . Multiple Vitamins-Minerals (COMPLETE MULTIVITAMIN/MINERAL) LIQD Take by mouth.      .  ondansetron (ZOFRAN) 8 MG tablet Take 1 tablet (8 mg total) by mouth 2 (two) times daily as needed. Start on the third day after chemotherapy. 30 tablet 1  . prochlorperazine (COMPAZINE) 10 MG tablet Take 1 tablet (10 mg total) by mouth every 6 (six) hours as needed (Nausea or vomiting). 30 tablet 1  . SYNTHROID 75 MCG tablet Take 1 tablet by mouth once daily 90 tablet 3   No current facility-administered medications for this visit.    Facility-Administered Medications Ordered in Other Visits  Medication Dose Route Frequency Provider Last Rate Last Dose  . fentaNYL (SUBLIMAZE) 100 MCG/2ML injection           . heparin lock flush 100 UNIT/ML injection           . lidocaine (XYLOCAINE) 1 % (with pres) injection           . midazolam (VERSED) 2 MG/2ML injection             PHYSICAL EXAMINATION: ECOG PERFORMANCE STATUS: 1 - Symptomatic but completely ambulatory  Vitals:   12/30/18 1144  BP: 126/83  Pulse: 75  Resp: 17  Temp: 98.3 F (36.8 C)  SpO2: 100%   Filed Weights   12/30/18 1144  Weight: 136 lb 14.4 oz (62.1 kg)    GENERAL: alert, no distress and comfortable SKIN: skin color, texture, turgor are normal, no rashes or significant lesions EYES: normal, Conjunctiva are pink  and non-injected, sclera clear OROPHARYNX: no exudate, no erythema and lips, buccal mucosa, and tongue normal  NECK: supple, thyroid normal size, non-tender, without nodularity LYMPH: no palpable lymphadenopathy in the cervical, axillary or inguinal LUNGS: clear to auscultation and percussion with normal breathing effort HEART: regular rate & rhythm and no murmurs and no lower extremity edema ABDOMEN: abdomen soft, non-tender and normal bowel sounds MUSCULOSKELETAL: no cyanosis of digits and no clubbing  NEURO: alert & oriented x 3 with fluent speech, no focal motor/sensory deficits EXTREMITIES: No lower extremity edema  LABORATORY DATA:  I have reviewed the data as listed CMP Latest Ref Rng & Units  12/17/2018 05/18/2009 04/15/2009  Glucose 70 - 99 mg/dL 93 79 98  BUN 6 - 20 mg/dL 17 14 -  Creatinine 0.44 - 1.00 mg/dL 1.09(H) 0.84 -  Sodium 135 - 145 mmol/L 143 140 -  Potassium 3.5 - 5.1 mmol/L 4.4 3.9 -  Chloride 98 - 111 mmol/L 107 106 -  CO2 22 - 32 mmol/L 26 26 -  Calcium 8.9 - 10.3 mg/dL 9.8 9.5 -  Total Protein 6.5 - 8.1 g/dL 7.6 6.9 -  Total Bilirubin 0.3 - 1.2 mg/dL 0.2(L) 0.5 -  Alkaline Phos 38 - 126 U/L 90 52 -  AST 15 - 41 U/L 17 22 -  ALT 0 - 44 U/L 18 19 -    Lab Results  Component Value Date   WBC 7.5 12/30/2018   HGB 14.0 12/30/2018   HCT 42.7 12/30/2018   MCV 91.6 12/30/2018   PLT 299 12/30/2018   NEUTROABS 4.9 12/17/2018    ASSESSMENT & PLAN:  Malignant neoplasm of lower-inner quadrant of right breast of female, estrogen receptor negative ([place]) 12/12/2018:Patient palpated right breast mass. Mammogram showed a 3.3cm solid and cystic right breast mass, 6 o'clock position, no abnormal right axillary lymph nodes. 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  Neoadjuvant chemotherapy with dose dense Adriamycin and Cytoxan x4 followed by Taxol weekly x12 2.  Breast conserving surgery with sentinel lymph node biopsy 3.  Adjuvant radiation Upbeat clinical trial: No adverse effects due to participation in the study ----------------------------------------------------------------------------------------------------------------------------------------------------- Current treatment: Cycle 1 day 1 dose dense Adriamycin and Cytoxan Echocardiogram: 12/22/2018: EF 60 to 65% Chemo education completed, chemo consent obtained Antiemetics reviewed, labs reviewed Return to clinic in 1 week for toxicity check     No orders of the defined types were placed in this encounter.  The patient has a good understanding of the overall plan. she agrees with it. she will call with  any problems that may develop before the next visit here.  [name], MD 12/30/2018  Julious Oka Dorshimer, am acting as scribe for Dr. [name].  I have reviewed the above documentation for accuracy and completeness, and I agree with the above.

## 2018-12-29 NOTE — Telephone Encounter (Signed)
12/29/18 - Called and spoke to patient on the phone.  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  Patient confirmed she understood. Remer Macho 12/29/18

## 2018-12-30 ENCOUNTER — Encounter (HOSPITAL_COMMUNITY): Payer: Self-pay

## 2018-12-30 ENCOUNTER — Other Ambulatory Visit: Payer: Self-pay

## 2018-12-30 ENCOUNTER — Ambulatory Visit (HOSPITAL_COMMUNITY)
Admission: RE | Admit: 2018-12-30 | Discharge: 2018-12-30 | Disposition: A | Discharge status: Home / Self Care | Payer: Medicaid Other | Source: Ambulatory Visit | Attending: Hematology and Oncology | Admitting: Hematology and Oncology

## 2018-12-30 ENCOUNTER — Encounter: Payer: Self-pay | Admitting: Hematology and Oncology

## 2018-12-30 ENCOUNTER — Inpatient Hospital Stay: Payer: Medicaid Other

## 2018-12-30 ENCOUNTER — Encounter: Payer: Self-pay | Admitting: Medical Oncology

## 2018-12-30 ENCOUNTER — Inpatient Hospital Stay (HOSPITAL_BASED_OUTPATIENT_CLINIC_OR_DEPARTMENT_OTHER): Payer: Medicaid Other | Admitting: Hematology and Oncology

## 2018-12-30 ENCOUNTER — Other Ambulatory Visit: Payer: Self-pay | Admitting: Hematology and Oncology

## 2018-12-30 DIAGNOSIS — Z79890 Hormone replacement therapy (postmenopausal): Secondary | ICD-10-CM | POA: Diagnosis not present

## 2018-12-30 DIAGNOSIS — Z171 Estrogen receptor negative status [ER-]: Secondary | ICD-10-CM

## 2018-12-30 DIAGNOSIS — C50311 Malignant neoplasm of lower-inner quadrant of right female breast: Secondary | ICD-10-CM

## 2018-12-30 DIAGNOSIS — Z7982 Long term (current) use of aspirin: Secondary | ICD-10-CM | POA: Diagnosis not present

## 2018-12-30 DIAGNOSIS — E039 Hypothyroidism, unspecified: Secondary | ICD-10-CM | POA: Diagnosis not present

## 2018-12-30 DIAGNOSIS — Z79899 Other long term (current) drug therapy: Secondary | ICD-10-CM | POA: Diagnosis not present

## 2018-12-30 DIAGNOSIS — C50911 Malignant neoplasm of unspecified site of right female breast: Secondary | ICD-10-CM | POA: Diagnosis not present

## 2018-12-30 DIAGNOSIS — Z5111 Encounter for antineoplastic chemotherapy: Secondary | ICD-10-CM | POA: Diagnosis not present

## 2018-12-30 HISTORY — PX: IR IMAGING GUIDED PORT INSERTION: IMG5740

## 2018-12-30 LAB — CBC WITH DIFFERENTIAL (CANCER CENTER ONLY)
Abs Immature Granulocytes: 0.01 10*3/uL (ref 0.00–0.07)
Basophils Absolute: 0 10*3/uL (ref 0.0–0.1)
Basophils Relative: 1 %
Eosinophils Absolute: 0.1 10*3/uL (ref 0.0–0.5)
Eosinophils Relative: 1 %
HCT: 39.9 % (ref 36.0–46.0)
Hemoglobin: 12.9 g/dL (ref 12.0–15.0)
Immature Granulocytes: 0 %
Lymphocytes Relative: 38 %
Lymphs Abs: 2.4 10*3/uL (ref 0.7–4.0)
MCH: 29.5 pg (ref 26.0–34.0)
MCHC: 32.3 g/dL (ref 30.0–36.0)
MCV: 91.3 fL (ref 80.0–100.0)
Monocytes Absolute: 0.3 10*3/uL (ref 0.1–1.0)
Monocytes Relative: 5 %
Neutro Abs: 3.5 10*3/uL (ref 1.7–7.7)
Neutrophils Relative %: 55 %
Platelet Count: 274 10*3/uL (ref 150–400)
RBC: 4.37 MIL/uL (ref 3.87–5.11)
RDW: 12.5 % (ref 11.5–15.5)
WBC Count: 6.4 10*3/uL (ref 4.0–10.5)
nRBC: 0 % (ref 0.0–0.2)

## 2018-12-30 LAB — CMP (CANCER CENTER ONLY)
ALT: 13 U/L (ref 0–44)
AST: 15 U/L (ref 15–41)
Albumin: 3.9 g/dL (ref 3.5–5.0)
Alkaline Phosphatase: 75 U/L (ref 38–126)
Anion gap: 8 (ref 5–15)
BUN: 15 mg/dL (ref 6–20)
CO2: 25 mmol/L (ref 22–32)
Calcium: 8.7 mg/dL — ABNORMAL LOW (ref 8.9–10.3)
Chloride: 109 mmol/L (ref 98–111)
Creatinine: 0.81 mg/dL (ref 0.44–1.00)
GFR, Est AFR Am: >60 mL/min (ref >60)
GFR, Estimated: >60 mL/min (ref >60)
Glucose, Bld: 92 mg/dL (ref 70–99)
Potassium: 3.8 mmol/L (ref 3.5–5.1)
Sodium: 142 mmol/L (ref 135–145)
Total Bilirubin: 0.2 mg/dL — ABNORMAL LOW (ref 0.3–1.2)
Total Protein: 6.8 g/dL (ref 6.5–8.1)

## 2018-12-30 LAB — CBC
HCT: 42.7 % (ref 36.0–46.0)
Hemoglobin: 14 g/dL (ref 12.0–15.0)
MCH: 30 pg (ref 26.0–34.0)
MCHC: 32.8 g/dL (ref 30.0–36.0)
MCV: 91.6 fL (ref 80.0–100.0)
Platelets: 299 10*3/uL (ref 150–400)
RBC: 4.66 MIL/uL (ref 3.87–5.11)
RDW: 12.5 % (ref 11.5–15.5)
WBC: 7.5 10*3/uL (ref 4.0–10.5)
nRBC: 0 % (ref 0.0–0.2)

## 2018-12-30 LAB — APTT: aPTT: 32 seconds (ref 24–36)

## 2018-12-30 LAB — PROTIME-INR
INR: 0.9 (ref 0.8–1.2)
Prothrombin Time: 12.3 seconds (ref 11.4–15.2)

## 2018-12-30 LAB — RESEARCH LABS

## 2018-12-30 IMAGING — XA IR IMAGING GUIDED PORT INSERTION
2 series · 2 of 2 positions shown · non-contrast
Comparison: none

CLINICAL DATA: Right breast carcinoma and need for porta cath to
begin chemotherapy.

[Series 1: (id) · 1 of 1 slices shown]
[im 1/1]
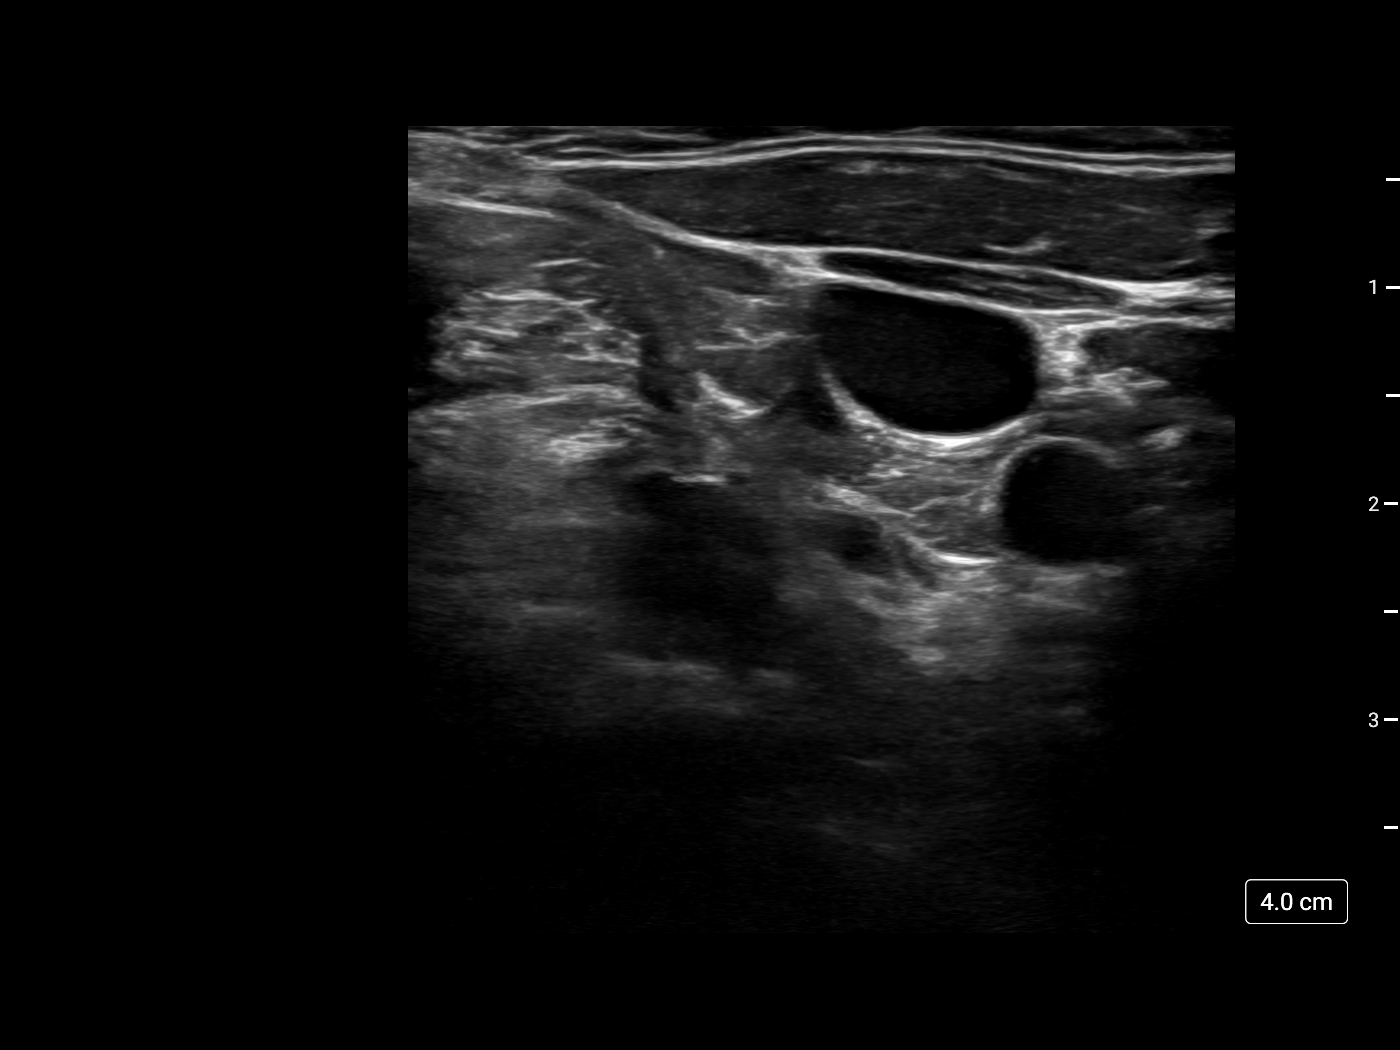

[Series 300: ir perc tun perit cath w/port s&i /imag · 1 of 1 slices shown]
[im 1/1]
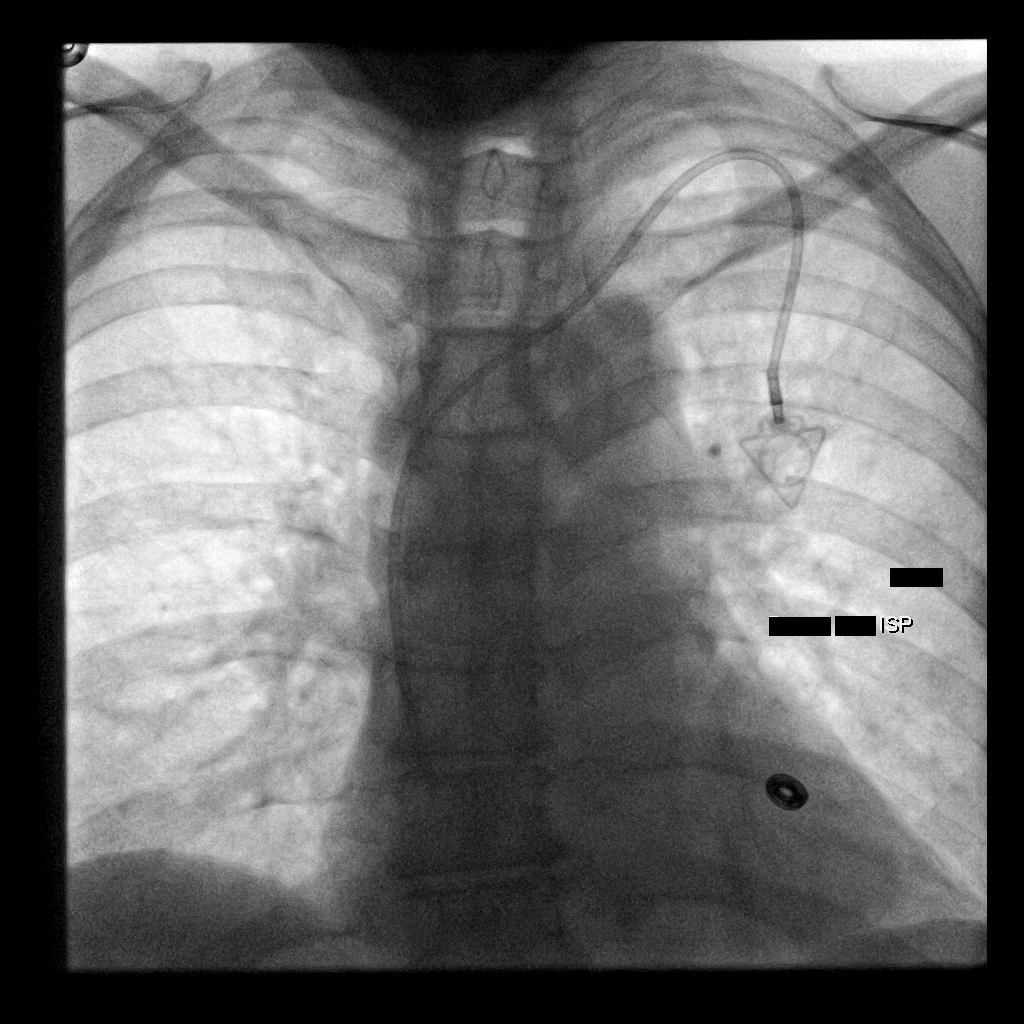

[2 of 2 positions shown; findings below may reference images not displayed]

EXAM:
IMPLANTED PORT A CATH PLACEMENT WITH ULTRASOUND AND FLUOROSCOPIC
GUIDANCE

ANESTHESIA/SEDATION:
4.0 mg IV Versed; 100 mcg IV Fentanyl

Total Moderate Sedation Time:  38 minutes

The patient's level of consciousness and physiologic status were
continuously monitored during the procedure by Radiology nursing.

Additional Medications: 2 g IV Ancef.

FLUOROSCOPY TIME:  24 seconds.  1.8 mGy.

PROCEDURE:
The procedure, risks, benefits, and alternatives were explained to
the patient. Questions regarding the procedure were encouraged and
answered. The patient understands and consents to the procedure. A
time-out was performed prior to initiating the procedure.

Ultrasound was utilized to confirm patency of the left internal
jugular vein. The left neck and chest were prepped with
chlorhexidine in a sterile fashion, and a sterile drape was applied
covering the operative field. Maximum barrier sterile technique with
sterile gowns and gloves were used for the procedure. Local
anesthesia was provided with 1% lidocaine.

After creating a small venotomy incision, a 21 gauge needle was
advanced into the left internal jugular vein under direct, real-time
ultrasound guidance. Ultrasound image documentation was performed.
After securing guidewire access, an 8 Fr dilator was placed. A
J-wire was kinked to measure appropriate catheter length.

A subcutaneous port pocket was then created along the upper chest
wall utilizing sharp and blunt dissection. Portable cautery was
utilized. The pocket was irrigated with sterile saline.

A single lumen power injectable port was chosen for placement. The 8
Fr catheter was tunneled from the port pocket site to the venotomy
incision. The port was placed in the pocket. External catheter was
trimmed to appropriate length based on guidewire measurement.

At the venotomy, an 8 Fr peel-away sheath was placed over a
guidewire. The catheter was then placed through the sheath and the
sheath removed. Final catheter positioning was confirmed and
documented with a fluoroscopic spot image. The port was accessed
with a needle and aspirated and flushed with heparinized saline. The
access needle was removed.

The venotomy and port pocket incisions were closed with subcutaneous
3-0 Monocryl and subcuticular 4-0 Vicryl. Dermabond was applied to
both incisions.

COMPLICATIONS:
COMPLICATIONS
None
FINDINGS: After catheter placement, the tip lies at the WURTZ junction.
The catheter aspirates normally and is ready for immediate use.
IMPRESSION: Placement of single lumen port a cath via left internal jugular
vein. The catheter tip lies at the WURTZ junction. A power
injectable port a cath was placed and is ready for immediate use.

## 2018-12-30 MED ORDER — SODIUM CHLORIDE 0.9% FLUSH
10.0000 mL | INTRAVENOUS | Status: DC | PRN
  Administered 2018-12-30: 10 mL
  Filled 2018-12-30: qty 10

## 2018-12-30 MED ORDER — LIDOCAINE HCL (PF) 1 % IJ SOLN
INTRAMUSCULAR | Status: AC | PRN
  Administered 2018-12-30: 5 mL

## 2018-12-30 MED ORDER — LIDOCAINE HCL 1 % IJ SOLN
INTRAMUSCULAR | Status: AC
  Filled 2018-12-30: qty 20

## 2018-12-30 MED ORDER — SODIUM CHLORIDE 0.9 % IV SOLN
Freq: Once | INTRAVENOUS | Status: AC
  Administered 2018-12-30: 13:00:00 via INTRAVENOUS
  Filled 2018-12-30: qty 250

## 2018-12-30 MED ORDER — DOXORUBICIN HCL CHEMO IV INJECTION 2 MG/ML
60.0000 mg/m2 | Freq: Once | INTRAVENOUS | Status: AC
  Administered 2018-12-30: 102 mg via INTRAVENOUS
  Filled 2018-12-30: qty 51

## 2018-12-30 MED ORDER — SODIUM CHLORIDE 0.9 % IV SOLN
600.0000 mg/m2 | Freq: Once | INTRAVENOUS | Status: AC
  Administered 2018-12-30: 1020 mg via INTRAVENOUS
  Filled 2018-12-30: qty 51

## 2018-12-30 MED ORDER — PALONOSETRON HCL INJECTION 0.25 MG/5ML
0.2500 mg | Freq: Once | INTRAVENOUS | Status: AC
  Administered 2018-12-30: 0.25 mg via INTRAVENOUS

## 2018-12-30 MED ORDER — CEFAZOLIN SODIUM-DEXTROSE 2-4 GM/100ML-% IV SOLN
INTRAVENOUS | Status: AC
  Administered 2018-12-30: 2 g via INTRAVENOUS
  Filled 2018-12-30: qty 100

## 2018-12-30 MED ORDER — MIDAZOLAM HCL 2 MG/2ML IJ SOLN
INTRAMUSCULAR | Status: AC
  Filled 2018-12-30: qty 4

## 2018-12-30 MED ORDER — FENTANYL CITRATE (PF) 100 MCG/2ML IJ SOLN
INTRAMUSCULAR | Status: AC
  Filled 2018-12-30: qty 2

## 2018-12-30 MED ORDER — CEFAZOLIN SODIUM-DEXTROSE 2-4 GM/100ML-% IV SOLN
2.0000 g | INTRAVENOUS | Status: AC
  Administered 2018-12-30: 10:00:00 2 g via INTRAVENOUS

## 2018-12-30 MED ORDER — OXYCODONE-ACETAMINOPHEN 5-325 MG PO TABS
ORAL_TABLET | ORAL | Status: AC
  Filled 2018-12-30: qty 1

## 2018-12-30 MED ORDER — HEPARIN SOD (PORK) LOCK FLUSH 100 UNIT/ML IV SOLN
500.0000 [IU] | Freq: Once | INTRAVENOUS | Status: AC | PRN
  Administered 2018-12-30: 500 [IU]
  Filled 2018-12-30: qty 5

## 2018-12-30 MED ORDER — PALONOSETRON HCL INJECTION 0.25 MG/5ML
INTRAVENOUS | Status: AC
  Filled 2018-12-30: qty 5

## 2018-12-30 MED ORDER — SODIUM CHLORIDE 0.9 % IV SOLN
Freq: Once | INTRAVENOUS | Status: AC
  Administered 2018-12-30: 09:00:00 via INTRAVENOUS

## 2018-12-30 MED ORDER — FENTANYL CITRATE (PF) 100 MCG/2ML IJ SOLN
INTRAMUSCULAR | Status: AC | PRN
  Administered 2018-12-30 (×2): 50 ug via INTRAVENOUS

## 2018-12-30 MED ORDER — HEPARIN SOD (PORK) LOCK FLUSH 100 UNIT/ML IV SOLN
INTRAVENOUS | Status: AC
  Filled 2018-12-30: qty 5

## 2018-12-30 MED ORDER — OXYCODONE-ACETAMINOPHEN 5-325 MG PO TABS
1.0000 | ORAL_TABLET | Freq: Once | ORAL | Status: AC
  Administered 2018-12-30: 1 via ORAL

## 2018-12-30 MED ORDER — MIDAZOLAM HCL 2 MG/2ML IJ SOLN
INTRAMUSCULAR | Status: AC | PRN
  Administered 2018-12-30 (×4): 1 mg via INTRAVENOUS

## 2018-12-30 MED ORDER — SODIUM CHLORIDE 0.9 % IV SOLN
Freq: Once | INTRAVENOUS | Status: AC
  Administered 2018-12-30: 13:00:00 via INTRAVENOUS
  Filled 2018-12-30: qty 5

## 2018-12-30 MED ORDER — HEPARIN SOD (PORK) LOCK FLUSH 100 UNIT/ML IV SOLN
INTRAVENOUS | Status: AC | PRN
  Administered 2018-12-30: 500 [IU] via INTRAVENOUS

## 2018-12-30 NOTE — Research (Signed)
UPBEAT: completion of Baseline assessments+ Labs I met with patient today, after her port-placement and prior to her visit with Dr. Lindi Adie this morning for first chemotherapy treatment. Patient's baseline research labs were drawn and patient confirms fasting for at least three hours. Height, weight and vitals obtained, per protocol.  1st B/P and HR taken @ 11:44 AM= 126/83, HR- 75 2nd B/P and HR taken @ 11:45 AM = 118/78, HR - 77 Waist circumference = 33 1/2 inches. Height and Weight: 65", 62.1 kg Patient was dispensed the study provided gift card of $25 (#[id number]) for completion of baseline assessments.  Patient thanked for her time and support of study and encouraged to call with questions. [name], RN, BSN, [facility] Clinical Research 12/30/2018 2:10 PM
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
## 2018-12-30 NOTE — Patient Instructions (Signed)
[facility] Discharge Instructions for Patients Receiving Chemotherapy  Today you received the following chemotherapy agents Doxorubicin, Cytoxan.  To help prevent nausea and vomiting after your treatment, we encourage you to take your nausea medication. DO NOT TAKE ZOFRAN FOR THREE DAYS AFTER TREATMENT.   If you develop nausea and vomiting that is not controlled by your nausea medication, call the clinic.   BELOW ARE SYMPTOMS THAT SHOULD BE REPORTED IMMEDIATELY:  *FEVER GREATER THAN 100.5 F  *CHILLS WITH OR WITHOUT FEVER  NAUSEA AND VOMITING THAT IS NOT CONTROLLED WITH YOUR NAUSEA MEDICATION  *UNUSUAL SHORTNESS OF BREATH  *UNUSUAL BRUISING OR BLEEDING  TENDERNESS IN MOUTH AND THROAT WITH OR WITHOUT PRESENCE OF ULCERS  *URINARY PROBLEMS  *BOWEL PROBLEMS  UNUSUAL RASH Items with * indicate a potential emergency and should be followed up as soon as possible.  Feel free to call the clinic should you have any questions or concerns. The clinic phone number is [phone].  Please show the [facility] at check-in to the Emergency Department and triage nurse.  Cyclophosphamide injection What is this medicine? CYCLOPHOSPHAMIDE (sye kloe FOSS fa mide) is a chemotherapy drug. It slows the growth of cancer cells. This medicine is used to treat many types of cancer like lymphoma, myeloma, leukemia, breast cancer, and ovarian cancer, to name a few. This medicine may be used for other purposes; ask your health care provider or pharmacist if you have questions. COMMON BRAND NAME(S): Cytoxan, Neosar What should I tell my health care provider before I take this medicine? They need to know if you have any of these conditions:  blood disorders  history of other chemotherapy  infection  kidney disease  liver disease  recent or ongoing radiation therapy  tumors in the bone marrow  an unusual or allergic reaction to cyclophosphamide, other chemotherapy, other  medicines, foods, dyes, or preservatives  pregnant or trying to get pregnant  breast-feeding How should I use this medicine? This drug is usually given as an injection into a vein or muscle or by infusion into a vein. It is administered in a hospital or clinic by a specially trained health care professional. Talk to your pediatrician regarding the use of this medicine in children. Special care may be needed. Overdosage: If you think you have taken too much of this medicine contact a poison control center or emergency room at once. NOTE: This medicine is only for you. Do not share this medicine with others. What if I miss a dose? It is important not to miss your dose. Call your doctor or health care professional if you are unable to keep an appointment. What may interact with this medicine? This medicine may interact with the following medications:  amiodarone  amphotericin B  azathioprine  certain antiviral medicines for HIV or AIDS such as protease inhibitors (e.g., indinavir, ritonavir) and zidovudine  certain blood pressure medications such as benazepril, captopril, enalapril, fosinopril, lisinopril, moexipril, monopril, perindopril, quinapril, ramipril, trandolapril  certain cancer medications such as anthracyclines (e.g., daunorubicin, doxorubicin), busulfan, cytarabine, paclitaxel, pentostatin, tamoxifen, trastuzumab  certain diuretics such as chlorothiazide, chlorthalidone, hydrochlorothiazide, indapamide, metolazone  certain medicines that treat or prevent blood clots like warfarin  certain muscle relaxants such as succinylcholine  cyclosporine  etanercept  indomethacin  medicines to increase blood counts like filgrastim, pegfilgrastim, sargramostim  medicines used as general anesthesia  metronidazole  natalizumab This list may not describe all possible interactions. Give your health care provider a list of all the medicines,  herbs, non-prescription drugs, or  dietary supplements you use. Also tell them if you smoke, drink alcohol, or use illegal drugs. Some items may interact with your medicine. What should I watch for while using this medicine? Visit your doctor for checks on your progress. This drug may make you feel generally unwell. This is not uncommon, as chemotherapy can affect healthy cells as well as cancer cells. Report any side effects. Continue your course of treatment even though you feel ill unless your doctor tells you to stop. Drink water or other fluids as directed. Urinate often, even at night. In some cases, you may be given additional medicines to help with side effects. Follow all directions for their use. Call your doctor or health care professional for advice if you get a fever, chills or sore throat, or other symptoms of a cold or flu. Do not treat yourself. This drug decreases your body's ability to fight infections. Try to avoid being around people who are sick. This medicine may increase your risk to bruise or bleed. Call your doctor or health care professional if you notice any unusual bleeding. Be careful brushing and flossing your teeth or using a toothpick because you may get an infection or bleed more easily. If you have any dental work done, tell your dentist you are receiving this medicine. You may get drowsy or dizzy. Do not drive, use machinery, or do anything that needs mental alertness until you know how this medicine affects you. Do not become pregnant while taking this medicine or for 1 year after stopping it. Women should inform their doctor if they wish to become pregnant or think they might be pregnant. Men should not father a child while taking this medicine and for 4 months after stopping it. There is a potential for serious side effects to an unborn child. Talk to your health care professional or pharmacist for more information. Do not breast-feed an infant while taking this medicine. This medicine may interfere  with the ability to have a child. This medicine has caused ovarian failure in some women. This medicine has caused reduced sperm counts in some men. You should talk with your doctor or health care professional if you are concerned about your fertility. If you are going to have surgery, tell your doctor or health care professional that you have taken this medicine. What side effects may I notice from receiving this medicine? Side effects that you should report to your doctor or health care professional as soon as possible:  allergic reactions like skin rash, itching or hives, swelling of the face, lips, or tongue  low blood counts - this medicine may decrease the number of white blood cells, red blood cells and platelets. You may be at increased risk for infections and bleeding.  signs of infection - fever or chills, cough, sore throat, pain or difficulty passing urine  signs of decreased platelets or bleeding - bruising, pinpoint red spots on the skin, black, tarry stools, blood in the urine  signs of decreased red blood cells - unusually weak or tired, fainting spells, lightheadedness  breathing problems  dark urine  dizziness  palpitations  swelling of the ankles, feet, hands  trouble passing urine or change in the amount of urine  weight gain  yellowing of the eyes or skin Side effects that usually do not require medical attention (report to your doctor or health care professional if they continue or are bothersome):  changes in nail or skin color  hair loss  missed menstrual periods  mouth sores  nausea, vomiting This list may not describe all possible side effects. Call your doctor for medical advice about side effects. You may report side effects to FDA at 1-800-FDA-1088. Where should I keep my medicine? This drug is given in a hospital or clinic and will not be stored at home. NOTE: This sheet is a summary. It may not cover all possible information. If you have  questions about this medicine, talk to your doctor, pharmacist, or health care provider.  2020 Elsevier/Gold Standard (2011-12-07 16:22:58) Doxorubicin injection What is this medicine? DOXORUBICIN (dox oh ROO bi sin) is a chemotherapy drug. It is used to treat many kinds of cancer like leukemia, lymphoma, neuroblastoma, sarcoma, and Wilms' tumor. It is also used to treat bladder cancer, breast cancer, lung cancer, ovarian cancer, stomach cancer, and thyroid cancer. This medicine may be used for other purposes; ask your health care provider or pharmacist if you have questions. COMMON BRAND NAME(S): Adriamycin, Adriamycin PFS, Adriamycin RDF, Rubex What should I tell my health care provider before I take this medicine? They need to know if you have any of these conditions:  heart disease  history of low blood counts caused by a medicine  liver disease  recent or ongoing radiation therapy  an unusual or allergic reaction to doxorubicin, other chemotherapy agents, other medicines, foods, dyes, or preservatives  pregnant or trying to get pregnant  breast-feeding How should I use this medicine? This drug is given as an infusion into a vein. It is administered in a hospital or clinic by a specially trained health care professional. If you have pain, swelling, burning or any unusual feeling around the site of your injection, tell your health care professional right away. Talk to your pediatrician regarding the use of this medicine in children. Special care may be needed. Overdosage: If you think you have taken too much of this medicine contact a poison control center or emergency room at once. NOTE: This medicine is only for you. Do not share this medicine with others. What if I miss a dose? It is important not to miss your dose. Call your doctor or health care professional if you are unable to keep an appointment. What may interact with this medicine? This medicine may interact with the  following medications:  6-mercaptopurine  paclitaxel  phenytoin  St. John's Wort  trastuzumab  verapamil This list may not describe all possible interactions. Give your health care provider a list of all the medicines, herbs, non-prescription drugs, or dietary supplements you use. Also tell them if you smoke, drink alcohol, or use illegal drugs. Some items may interact with your medicine. What should I watch for while using this medicine? This drug may make you feel generally unwell. This is not uncommon, as chemotherapy can affect healthy cells as well as cancer cells. Report any side effects. Continue your course of treatment even though you feel ill unless your doctor tells you to stop. There is a maximum amount of this medicine you should receive throughout your life. The amount depends on the medical condition being treated and your overall health. Your doctor will watch how much of this medicine you receive in your lifetime. Tell your doctor if you have taken this medicine before. You may need blood work done while you are taking this medicine. Your urine may turn red for a few days after your dose. This is not blood. If your urine is dark or brown, call your doctor. In  some cases, you may be given additional medicines to help with side effects. Follow all directions for their use. Call your doctor or health care professional for advice if you get a fever, chills or sore throat, or other symptoms of a cold or flu. Do not treat yourself. This drug decreases your body's ability to fight infections. Try to avoid being around people who are sick. This medicine may increase your risk to bruise or bleed. Call your doctor or health care professional if you notice any unusual bleeding. Talk to your doctor about your risk of cancer. You may be more at risk for certain types of cancers if you take this medicine. Do not become pregnant while taking this medicine or for 6 months after stopping it.  Women should inform their doctor if they wish to become pregnant or think they might be pregnant. Men should not father a child while taking this medicine and for 6 months after stopping it. There is a potential for serious side effects to an unborn child. Talk to your health care professional or pharmacist for more information. Do not breast-feed an infant while taking this medicine. This medicine has caused ovarian failure in some women and reduced sperm counts in some men This medicine may interfere with the ability to have a child. Talk with your doctor or health care professional if you are concerned about your fertility. This medicine may cause a decrease in Co-Enzyme Q-10. You should make sure that you get enough Co-Enzyme Q-10 while you are taking this medicine. Discuss the foods you eat and the vitamins you take with your health care professional. What side effects may I notice from receiving this medicine? Side effects that you should report to your doctor or health care professional as soon as possible:  allergic reactions like skin rash, itching or hives, swelling of the face, lips, or tongue  breathing problems  chest pain  fast or irregular heartbeat  low blood counts - this medicine may decrease the number of white blood cells, red blood cells and platelets. You may be at increased risk for infections and bleeding.  pain, redness, or irritation at site where injected  signs of infection - fever or chills, cough, sore throat, pain or difficulty passing urine  signs of decreased platelets or bleeding - bruising, pinpoint red spots on the skin, black, tarry stools, blood in the urine  swelling of the ankles, feet, hands  tiredness  weakness Side effects that usually do not require medical attention (report to your doctor or health care professional if they continue or are bothersome):  diarrhea  hair loss  mouth sores  nail discoloration or damage  nausea  red  colored urine  vomiting This list may not describe all possible side effects. Call your doctor for medical advice about side effects. You may report side effects to FDA at 1-800-FDA-1088. Where should I keep my medicine? This drug is given in a hospital or clinic and will not be stored at home. NOTE: This sheet is a summary. It may not cover all possible information. If you have questions about this medicine, talk to your doctor, pharmacist, or health care provider.  2020 Elsevier/Gold Standard (2016-09-05 11:01:26)

## 2018-12-30 NOTE — Discharge Instructions (Signed)
Implanted Port Insertion, Care After °This sheet gives you information about how to care for yourself after your procedure. Your health care provider may also give you more specific instructions. If you have problems or questions, contact your health care provider. °What can I expect after the procedure? °After the procedure, it is common to have: °· Discomfort at the port insertion site. °· Bruising on the skin over the port. This should improve over 3-4 days. °Follow these instructions at home: °Port care °· After your port is placed, you will get a manufacturer's information card. The card has information about your port. Keep this card with you at all times. °· Take care of the port as told by your health care provider. Ask your health care provider if you or a family member can get training for taking care of the port at home. A home health care nurse may also take care of the port. °· Make sure to remember what type of port you have. °Incision care ° °  ° °· Follow instructions from your health care provider about how to take care of your port insertion site. Make sure you: °? Wash your hands with soap and water before and after you change your bandage (dressing). If soap and water are not available, use hand sanitizer. °? Change your dressing as told by your health care provider. °? Leave stitches (sutures), skin glue, or adhesive strips in place. These skin closures may need to stay in place for 2 weeks or longer. If adhesive strip edges start to loosen and curl up, you may trim the loose edges. Do not remove adhesive strips completely unless your health care provider tells you to do that. °· Check your port insertion site every day for signs of infection. Check for: °? Redness, swelling, or pain. °? Fluid or blood. °? Warmth. °? Pus or a bad smell. °Activity °· Return to your normal activities as told by your health care provider. Ask your health care provider what activities are safe for you. °· Do not  lift anything that is heavier than 10 lb (4.5 kg), or the limit that you are told, until your health care provider says that it is safe. °General instructions °· Take over-the-counter and prescription medicines only as told by your health care provider. °· Do not take baths, swim, or use a hot tub until your health care provider approves. Ask your health care provider if you may take showers. You may only be allowed to take sponge baths. °· Do not drive for 24 hours if you were given a sedative during your procedure. °· Wear a medical alert bracelet in case of an emergency. This will tell any health care providers that you have a port. °· Keep all follow-up visits as told by your health care provider. This is important. °Contact a health care provider if: °· You cannot flush your port with saline as directed, or you cannot draw blood from the port. °· You have a fever or chills. °· You have redness, swelling, or pain around your port insertion site. °· You have fluid or blood coming from your port insertion site. °· Your port insertion site feels warm to the touch. °· You have pus or a bad smell coming from the port insertion site. °Get help right away if: °· You have chest pain or shortness of breath. °· You have bleeding from your port that you cannot control. °Summary °· Take care of the port as told by your health   care provider. Keep the manufacturer's information card with you at all times. °· Change your dressing as told by your health care provider. °· Contact a health care provider if you have a fever or chills or if you have redness, swelling, or pain around your port insertion site. °· Keep all follow-up visits as told by your health care provider. °This information is not intended to replace advice given to you by your health care provider. Make sure you discuss any questions you have with your health care provider. °Document Released: 11/12/2012 Document Revised: 08/20/2017 Document Reviewed:  08/20/2017 °Elsevier Patient Education © 2020 Elsevier Inc. ° °Moderate Conscious Sedation, Adult, Care After °These instructions provide you with information about caring for yourself after your procedure. Your health care provider may also give you more specific instructions. Your treatment has been planned according to current medical practices, but problems sometimes occur. Call your health care provider if you have any problems or questions after your procedure. °What can I expect after the procedure? °After your procedure, it is common: °· To feel sleepy for several hours. °· To feel clumsy and have poor balance for several hours. °· To have poor judgment for several hours. °· To vomit if you eat too soon. °Follow these instructions at home: °For at least 24 hours after the procedure: ° °· Do not: °? Participate in activities where you could fall or become injured. °? Drive. °? Use heavy machinery. °? Drink alcohol. °? Take sleeping pills or medicines that cause drowsiness. °? Make important decisions or sign legal documents. °? Take care of children on your own. °· Rest. °Eating and drinking °· Follow the diet recommended by your health care provider. °· If you vomit: °? Drink water, juice, or soup when you can drink without vomiting. °? Make sure you have little or no nausea before eating solid foods. °General instructions °· Have a responsible adult stay with you until you are awake and alert. °· Take over-the-counter and prescription medicines only as told by your health care provider. °· If you smoke, do not smoke without supervision. °· Keep all follow-up visits as told by your health care provider. This is important. °Contact a health care provider if: °· You keep feeling nauseous or you keep vomiting. °· You feel light-headed. °· You develop a rash. °· You have a fever. °Get help right away if: °· You have trouble breathing. °This information is not intended to replace advice given to you by your  health care provider. Make sure you discuss any questions you have with your health care provider. °Document Released: 11/12/2012 Document Revised: 01/04/2017 Document Reviewed: 05/14/2015 °Elsevier Patient Education © 2020 Elsevier Inc. ° °

## 2018-12-30 NOTE — Progress Notes (Signed)
Met with patient at registration to introduce myself as Financial Resource Specialist and to offer available resources. ° °Discussed one-time $1000 [employer] grant and qualifications to assist with personal expenses while going through treatment. ° °Gave her my card if interested in applying and for any additional financial questions or concerns.  °

## 2018-12-30 NOTE — Progress Notes (Signed)
First Doxo, Cytoxan treatment went well. Pt. Complained of nausea most of the visit. She was placed with a Port this morning before scheduled tx. Education given.

## 2018-12-30 NOTE — Assessment & Plan Note (Addendum)
12/12/2018:Patient palpated right breast mass. Mammogram showed a 3.3cm solid and cystic right breast mass, 6 o'clock position, no abnormal right axillary lymph nodes. 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  Neoadjuvant chemotherapy with dose dense Adriamycin and Cytoxan x4 followed by Taxol weekly x12 2.  Breast conserving surgery with sentinel lymph node biopsy 3.  Adjuvant radiation Upbeat clinical trial: No adverse effects due to participation in the study ----------------------------------------------------------------------------------------------------------------------------------------------------- Current treatment: Cycle 1 day 1 dose dense Adriamycin and Cytoxan Echocardiogram: 12/22/2018: EF 60 to 65% Chemo education completed, chemo consent obtained Antiemetics reviewed, labs reviewed Return to clinic in 1 week for toxicity check

## 2018-12-30 NOTE — Progress Notes (Signed)
Okay to start treatment today with 12/17/18 BMET, per Dr. Lindi Adie.

## 2018-12-30 NOTE — Procedures (Signed)
Interventional Radiology Procedure Note  Procedure: Single Lumen Power Port Placement    Access:  Right IJ vein.  Findings: Catheter tip positioned at SVC/RA junction. Port is ready for immediate use.   Complications: None  EBL: < 10 mL  Recommendations:  - Ok to shower in 24 hours - Do not submerge for 7 days - Routine line care   Tyrika Newman T. Courtlyn Aki, M.D Pager:  [phone]   

## 2018-12-31 ENCOUNTER — Telehealth: Payer: Self-pay | Admitting: *Deleted

## 2018-12-31 ENCOUNTER — Telehealth: Payer: Self-pay | Admitting: Emergency Medicine

## 2018-12-31 ENCOUNTER — Encounter: Payer: Self-pay | Admitting: General Practice

## 2018-12-31 ENCOUNTER — Encounter: Payer: Self-pay | Admitting: *Deleted

## 2018-12-31 NOTE — Telephone Encounter (Signed)
Attempt x1 to call pt and pt mother Kara Pittman to check on pt to see if she is still able to hold down liquids and if her nausea is controlled.  No answer, LVM to return call to the office.

## 2018-12-31 NOTE — Telephone Encounter (Signed)
Left message to follow up after 1st chemo.

## 2018-12-31 NOTE — Progress Notes (Signed)
[place] Work  Clinical Social Work was asked by Futures trader to assess patient after completing the UPBEAT Depression Symptom Screening Questionnaire with the research department and scoring a number greater than 10.  CSW contacted patient at home to offer support and assess.  CSW left patient a detailed message with contact information, and encouraged patient to return call.  Johnnye Lana, MSW, LCSW, OSW-C Clinical Social Worker [facility] [phone]

## 2018-12-31 NOTE — Telephone Encounter (Signed)
RN updated [facility] Learta Codding, RN regarding Mrs. Ficco stating her nausea was under control and she did not want to be seen in [facility] today.

## 2018-12-31 NOTE — Telephone Encounter (Signed)
Call from pt's mother [place] this am as a f/u from calling the triage line last night.  Pt was provided with suppository nausea medication for continuous N/V unrelieved by oral antiemetics and advised to push oral fluids as able and f/u in the morning.  Per [place] pt was able to get some sleep after suppository doses and has not vomited this morning.  Pt has injection appt tomorrow (01/01/2019- Thanksgiving).  Due to limited staff on upcoming holiday visit [place] was advised to continue with antiemetics as needed and to push oral fluids/small amounts of bland food until 11-12 pm today, and to f/u to schedule appt with [facility] for today if she doesn't improve/worsens.  [place] understanding and agreement to plan, denies any further questions or concerns at this time.

## 2018-12-31 NOTE — Telephone Encounter (Signed)
Received call from pt stating her nausea is now controlled and she is able to eat and drink.  RN educated pt on importance of drinking water to prevent dehydration.  RN also reviewed pt antiemetic medications and instructed pt on when to take them.  RN also educated pt on constipation due to the nausea medication and instructed pt to take a daily stool softer or miralax.  Pt verbalized understanding and appreciative of the advice.

## 2018-12-31 NOTE — Progress Notes (Signed)
[place] CSW Progress Notes  Call to patient to assess/offer resources related to needs identified on UPBEAT questionnaire.  Unable to reach patient, left VM requesting return call.  Notified MD and CSW [place].  Edwyna Shell, LCSW Clinical Social Worker Phone:  [phone]

## 2019-01-01 ENCOUNTER — Other Ambulatory Visit: Payer: Self-pay

## 2019-01-01 ENCOUNTER — Inpatient Hospital Stay: Payer: Medicaid Other

## 2019-01-01 VITALS — BP 101/74 | HR 105 | Temp 98.7°F | Resp 18

## 2019-01-01 DIAGNOSIS — Z5111 Encounter for antineoplastic chemotherapy: Secondary | ICD-10-CM | POA: Diagnosis not present

## 2019-01-01 DIAGNOSIS — Z171 Estrogen receptor negative status [ER-]: Secondary | ICD-10-CM

## 2019-01-01 DIAGNOSIS — C50311 Malignant neoplasm of lower-inner quadrant of right female breast: Secondary | ICD-10-CM

## 2019-01-01 MED ORDER — PEGFILGRASTIM-CBQV 6 MG/0.6ML ~~LOC~~ SOSY
6.0000 mg | PREFILLED_SYRINGE | Freq: Once | SUBCUTANEOUS | Status: AC
  Administered 2019-01-01: 6 mg via SUBCUTANEOUS

## 2019-01-01 NOTE — Patient Instructions (Signed)

## 2019-01-05 ENCOUNTER — Telehealth: Payer: Self-pay | Admitting: Hematology and Oncology

## 2019-01-05 NOTE — Progress Notes (Signed)
Patient Care Team: [name], MD as PCP - General (Family Medicine) [name], RN as Oncology Nurse Navigator [name], RN as Oncology Nurse Navigator [name], MD as Consulting Physician (General Surgery) [name], MD as Consulting Physician (Hematology and Oncology) [name], MD as Consulting Physician (Radiation Oncology) [name], RN as Registered Nurse (Medical Oncology)  DIAGNOSIS:    ICD-10-CM   1. Malignant neoplasm of lower-inner quadrant of right breast of female, estrogen receptor negative ([place])  C50.311    Z17.1     SUMMARY OF ONCOLOGIC HISTORY: Oncology History  Malignant neoplasm of lower-inner quadrant of right breast of female, estrogen receptor negative ([place])  12/12/2018 Initial Diagnosis   Patient palpated right breast mass. Mammogram showed a 3.3cm solid and cystic right breast mass, 6 o'clock position, no abnormal right axillary lymph nodes. 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    12/17/2018 Cancer Staging   Staging form: Breast, AJCC 8th Edition - Clinical stage from 12/17/2018: Stage IIB (cT2, cN0, cM0, G3, ER-, PR-, HER2-) - Signed by [name], MD on 12/17/2018   12/23/2018 Genetic Testing   Negative genetic testing:  No pathogenic variants detected on the [facility] Multi-Cancer panel. A variant of uncertain significance was detected in the BARD1 gene called c.26_40dup. The report date is 12/23/2018.  The Multi-Cancer Panel offered by [facility] includes sequencing and/or deletion duplication testing of the following 85 genes: AIP, ALK, APC, ATM, AXIN2,BAP1,  BARD1, BLM, BMPR1A, BRCA1, BRCA2, BRIP1, CASR, CDC73, CDH1, CDK4, CDKN1B, CDKN1C, CDKN2A (p14ARF), CDKN2A (p16INK4a), CEBPA, CHEK2, CTNNA1, DICER1, DIS3L2, EGFR (c.2369C>T, p.Thr790Met variant only), EPCAM (Deletion/duplication testing only), FH, FLCN, GATA2, GPC3, GREM1 (Promoter region  deletion/duplication testing only), HOXB13 (c.251G>A, p.Gly84Glu), HRAS, KIT, MAX, MEN1, MET, MITF (c.952G>A, p.Glu318Lys variant only), MLH1, MSH2, MSH3, MSH6, MUTYH, NBN, NF1, NF2, NTHL1, PALB2, PDGFRA, PHOX2B, PMS2, POLD1, POLE, POT1, PRKAR1A, PTCH1, PTEN, RAD50, RAD51C, RAD51D, RB1, RECQL4, RET, RNF43, RUNX1, SDHAF2, SDHA (sequence changes only), SDHB, SDHC, SDHD, SMAD4, SMARCA4, SMARCB1, SMARCE1, STK11, SUFU, TERC, TERT, TMEM127, TP53, TSC1, TSC2, VHL, WRN and WT1.     12/30/2018 -  Chemotherapy   The patient had DOXOrubicin (ADRIAMYCIN) chemo injection 102 mg, 60 mg/m2 = 102 mg, Intravenous,  Once, 1 of 4 cycles Administration: 102 mg (12/30/2018) palonosetron (ALOXI) injection 0.25 mg, 0.25 mg, Intravenous,  Once, 1 of 8 cycles Administration: 0.25 mg (12/30/2018) pegfilgrastim-cbqv (UDENYCA) injection 6 mg, 6 mg, Subcutaneous, Once, 1 of 4 cycles Administration: 6 mg (01/01/2019) CARBOplatin (PARAPLATIN) 500.4 mg in sodium chloride 0.9 % 250 mL chemo infusion, 500.4 mg (100 % of original dose 500.4 mg), Intravenous,  Once, 0 of 4 cycles Dose modification:   (original dose 500.4 mg, Cycle 5) cyclophosphamide (CYTOXAN) 1,020 mg in sodium chloride 0.9 % 250 mL chemo infusion, 600 mg/m2 = 1,020 mg, Intravenous,  Once, 1 of 4 cycles Administration: 1,020 mg (12/30/2018) PACLitaxel (TAXOL) 138 mg in sodium chloride 0.9 % 250 mL chemo infusion (</= 75m/m2), 80 mg/m2 = 138 mg, Intravenous,  Once, 0 of 4 cycles fosaprepitant (EMEND) 150 mg, dexamethasone (DECADRON) 12 mg in sodium chloride 0.9 % 145 mL IVPB, , Intravenous,  Once, 1 of 8 cycles Administration:  (12/30/2018)  for chemotherapy treatment.      CHIEF COMPLIANT: Cycle 1 Day 8 Adriamycin and Cytoxan  INTERVAL HISTORY: Kara TEMPLERis a 53y.o. 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 She is currently on neoadjuvant chemotherapy  with dose dense Adriamycin and Cytoxan. She is a participant in the UpBeat  clinical trial. She presents to the clinic today for a toxicity check following cycle 1.    REVIEW OF SYSTEMS:   Constitutional: Denies fevers, chills or abnormal weight loss Eyes: Denies blurriness of vision Ears, nose, mouth, throat, and face: Denies mucositis or sore throat Respiratory: Denies cough, dyspnea or wheezes Cardiovascular: Denies palpitation, chest discomfort Gastrointestinal: Denies nausea, heartburn or change in bowel habits Skin: Denies abnormal skin rashes Lymphatics: Denies new lymphadenopathy or easy bruising Neurological: Denies numbness, tingling or new weaknesses Behavioral/Psych: Mood is stable, no new changes  Extremities: No lower extremity edema Breast: denies any pain or lumps or nodules in either breasts All other systems were reviewed with the patient and are negative.  I have reviewed the past medical history, past surgical history, social history and family history with the patient and they are unchanged from previous note.  ALLERGIES:  has No Known Allergies.  MEDICATIONS:  Current Outpatient Medications  Medication Sig Dispense Refill  . acetaminophen (TYLENOL) 325 MG tablet Take by mouth.    Marland Kitchen aspirin EC 81 MG tablet Take 1 tablet (81 mg total) by mouth daily. (Patient not taking: Reported on 12/17/2018)    . b complex vitamins capsule Take by mouth.    Marland Kitchen buPROPion (WELLBUTRIN XL) 150 MG 24 hr tablet Take 1 tablet by mouth daily 90 tablet 3  . CYTOMEL 5 MCG tablet Take 1 tablet by mouth once daily 90 tablet 3  . dexamethasone (DECADRON) 4 MG tablet Take 1 tablet day after chemo and 1 tablet 2 days after chemo with food 8 tablet 0  . levothyroxine (SYNTHROID, LEVOTHROID) 50 MCG tablet Take one tab two times per week in addition to the daily dose of 75 micrograms. 25 tablet 3  . lidocaine-prilocaine (EMLA) cream Apply to affected area once 30 g 3  . LORazepam (ATIVAN) 0.5 MG tablet Take 1 tablet (0.5 mg total) by mouth at bedtime as needed for sleep.  30 tablet 0  . Multiple Vitamin (MULTIVITAMIN) capsule Take by mouth.    . Multiple Vitamins-Minerals (COMPLETE MULTIVITAMIN/MINERAL) LIQD Take by mouth.      . ondansetron (ZOFRAN) 8 MG tablet Take 1 tablet (8 mg total) by mouth 2 (two) times daily as needed. Start on the third day after chemotherapy. 30 tablet 1  . prochlorperazine (COMPAZINE) 10 MG tablet Take 1 tablet (10 mg total) by mouth every 6 (six) hours as needed (Nausea or vomiting). 30 tablet 1  . SYNTHROID 75 MCG tablet Take 1 tablet by mouth once daily 90 tablet 3   No current facility-administered medications for this visit.     PHYSICAL EXAMINATION: ECOG PERFORMANCE STATUS: 1 - Symptomatic but completely ambulatory  Vitals:   01/06/19 1112  BP: 114/87  Pulse: 86  Resp: 20  Temp: 98.2 F (36.8 C)  SpO2: 100%   Filed Weights   01/06/19 1112  Weight: 139 lb 1.6 oz (63.1 kg)    GENERAL: alert, no distress and comfortable SKIN: skin color, texture, turgor are normal, no rashes or significant lesions EYES: normal, Conjunctiva are pink and non-injected, sclera clear OROPHARYNX: no exudate, no erythema and lips, buccal mucosa, and tongue normal  NECK: supple, thyroid normal size, non-tender, without nodularity LYMPH: no palpable lymphadenopathy in the cervical, axillary or inguinal LUNGS: clear to auscultation and percussion with normal breathing effort HEART: regular rate & rhythm and no murmurs and no lower extremity edema ABDOMEN: abdomen  soft, non-tender and normal bowel sounds MUSCULOSKELETAL: no cyanosis of digits and no clubbing  NEURO: alert & oriented x 3 with fluent speech, no focal motor/sensory deficits EXTREMITIES: No lower extremity edema  LABORATORY DATA:  I have reviewed the data as listed CMP Latest Ref Rng & Units 12/30/2018 12/17/2018 05/18/2009  Glucose 70 - 99 mg/dL 92 93 79  BUN 6 - 20 mg/dL 15 17 14   Creatinine 0.44 - 1.00 mg/dL 0.81 1.09(H) 0.84  Sodium 135 - 145 mmol/L 142 143 140   Potassium 3.5 - 5.1 mmol/L 3.8 4.4 3.9  Chloride 98 - 111 mmol/L 109 107 106  CO2 22 - 32 mmol/L 25 26 26   Calcium 8.9 - 10.3 mg/dL 8.7(L) 9.8 9.5  Total Protein 6.5 - 8.1 g/dL 6.8 7.6 6.9  Total Bilirubin 0.3 - 1.2 mg/dL 0.2(L) 0.2(L) 0.5  Alkaline Phos 38 - 126 U/L 75 90 52  AST 15 - 41 U/L 15 17 22   ALT 0 - 44 U/L 13 18 19     Lab Results  Component Value Date   WBC 2.4 (L) 01/06/2019   HGB 12.9 01/06/2019   HCT 39.3 01/06/2019   MCV 89.7 01/06/2019   PLT 173 01/06/2019   NEUTROABS PENDING 01/06/2019    ASSESSMENT & PLAN:  Malignant neoplasm of lower-inner quadrant of right breast of female, estrogen receptor negative ([place]) 12/12/2018:Patient palpated right breast mass. Mammogram showed a 3.3cm solid and cystic right breast mass, 6 o'clock position, no abnormal right axillary lymph nodes. 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Neoadjuvant chemotherapy with dose dense Adriamycin and Cytoxan x4 followed by Taxol weekly x12 2.Breast conserving surgery with sentinel lymph node biopsy 3.Adjuvant radiation Upbeat clinical trial: No adverse effects due to participation in the study ----------------------------------------------------------------------------------------------------------------------------------------------------- Current treatment: Cycle 1 day 8 dose dense Adriamycin and Cytoxan Echocardiogram: 12/22/2018: EF 60 to 65% Chemo toxicities: 1.  Nausea: Controlled with antiemetics 2.  Acid reflux: Sent prescription for Protonix Labs reviewed ANC 0.7 We will keep the dose of the same for treatment next week.  Return to clinic in 1 week for cycle 2    No orders of the defined types were placed in this encounter.  The patient has a good understanding of the overall plan. she agrees with it. she will call with any problems that may develop before the next visit  here.  [name], MD 01/06/2019  Julious Oka Dorshimer, am acting as scribe for Dr. [name].  I have reviewed the above documentation for accuracy and completeness, and I agree with the above.

## 2019-01-05 NOTE — Telephone Encounter (Signed)
Returned patient's phone call regarding rescheduling 12/09 and 12/11 appointment, per patient's request it has moved to 12/10 and 12/12.

## 2019-01-06 ENCOUNTER — Encounter: Payer: Self-pay | Admitting: Pharmacy Technician

## 2019-01-06 ENCOUNTER — Inpatient Hospital Stay: Payer: Medicaid Other | Attending: Hematology and Oncology | Admitting: Hematology and Oncology

## 2019-01-06 ENCOUNTER — Inpatient Hospital Stay: Payer: Medicaid Other

## 2019-01-06 ENCOUNTER — Other Ambulatory Visit: Payer: Self-pay

## 2019-01-06 DIAGNOSIS — Z171 Estrogen receptor negative status [ER-]: Secondary | ICD-10-CM

## 2019-01-06 DIAGNOSIS — Z7982 Long term (current) use of aspirin: Secondary | ICD-10-CM | POA: Diagnosis not present

## 2019-01-06 DIAGNOSIS — R112 Nausea with vomiting, unspecified: Secondary | ICD-10-CM | POA: Diagnosis not present

## 2019-01-06 DIAGNOSIS — Z79899 Other long term (current) drug therapy: Secondary | ICD-10-CM | POA: Diagnosis not present

## 2019-01-06 DIAGNOSIS — Z7952 Long term (current) use of systemic steroids: Secondary | ICD-10-CM | POA: Diagnosis not present

## 2019-01-06 DIAGNOSIS — Z7689 Persons encountering health services in other specified circumstances: Secondary | ICD-10-CM | POA: Insufficient documentation

## 2019-01-06 DIAGNOSIS — Z95828 Presence of other vascular implants and grafts: Secondary | ICD-10-CM

## 2019-01-06 DIAGNOSIS — Z5111 Encounter for antineoplastic chemotherapy: Secondary | ICD-10-CM | POA: Diagnosis not present

## 2019-01-06 DIAGNOSIS — K219 Gastro-esophageal reflux disease without esophagitis: Secondary | ICD-10-CM | POA: Diagnosis not present

## 2019-01-06 DIAGNOSIS — C50311 Malignant neoplasm of lower-inner quadrant of right female breast: Secondary | ICD-10-CM

## 2019-01-06 LAB — CBC WITH DIFFERENTIAL (CANCER CENTER ONLY)
Abs Immature Granulocytes: 0.07 10*3/uL (ref 0.00–0.07)
Basophils Absolute: 0.1 10*3/uL (ref 0.0–0.1)
Basophils Relative: 3 %
Eosinophils Absolute: 0.1 10*3/uL (ref 0.0–0.5)
Eosinophils Relative: 3 %
HCT: 39.3 % (ref 36.0–46.0)
Hemoglobin: 12.9 g/dL (ref 12.0–15.0)
Immature Granulocytes: 3 %
Lymphocytes Relative: 52 %
Lymphs Abs: 1.3 10*3/uL (ref 0.7–4.0)
MCH: 29.5 pg (ref 26.0–34.0)
MCHC: 32.8 g/dL (ref 30.0–36.0)
MCV: 89.7 fL (ref 80.0–100.0)
Monocytes Absolute: 0.3 10*3/uL (ref 0.1–1.0)
Monocytes Relative: 11 %
Neutro Abs: 0.7 10*3/uL — ABNORMAL LOW (ref 1.7–7.7)
Neutrophils Relative %: 28 %
Platelet Count: 173 10*3/uL (ref 150–400)
RBC: 4.38 MIL/uL (ref 3.87–5.11)
RDW: 12.2 % (ref 11.5–15.5)
WBC Count: 2.4 10*3/uL — ABNORMAL LOW (ref 4.0–10.5)
nRBC: 0 % (ref 0.0–0.2)

## 2019-01-06 LAB — CMP (CANCER CENTER ONLY)
ALT: 21 U/L (ref 0–44)
AST: 13 U/L — ABNORMAL LOW (ref 15–41)
Albumin: 3.9 g/dL (ref 3.5–5.0)
Alkaline Phosphatase: 92 U/L (ref 38–126)
Anion gap: 8 (ref 5–15)
BUN: 14 mg/dL (ref 6–20)
CO2: 28 mmol/L (ref 22–32)
Calcium: 9.5 mg/dL (ref 8.9–10.3)
Chloride: 105 mmol/L (ref 98–111)
Creatinine: 0.86 mg/dL (ref 0.44–1.00)
GFR, Est AFR Am: >60 mL/min (ref >60)
GFR, Estimated: >60 mL/min (ref >60)
Glucose, Bld: 89 mg/dL (ref 70–99)
Potassium: 4.3 mmol/L (ref 3.5–5.1)
Sodium: 141 mmol/L (ref 135–145)
Total Bilirubin: 0.3 mg/dL (ref 0.3–1.2)
Total Protein: 7.1 g/dL (ref 6.5–8.1)

## 2019-01-06 MED ORDER — SODIUM CHLORIDE 0.9% FLUSH
10.0000 mL | INTRAVENOUS | Status: DC | PRN
  Administered 2019-01-06: 10 mL via INTRAVENOUS
  Filled 2019-01-06: qty 10

## 2019-01-06 MED ORDER — PANTOPRAZOLE SODIUM 40 MG PO TBEC: 40.0000 mg | DELAYED_RELEASE_TABLET | Freq: Every day | ORAL | 3 refills | Status: DC

## 2019-01-06 MED ORDER — HEPARIN SOD (PORK) LOCK FLUSH 100 UNIT/ML IV SOLN
500.0000 [IU] | Freq: Once | INTRAVENOUS | Status: AC
  Administered 2019-01-06: 500 [IU] via INTRAVENOUS
  Filled 2019-01-06: qty 5

## 2019-01-06 NOTE — Assessment & Plan Note (Signed)
12/12/2018:Patient palpated right breast mass. Mammogram showed a 3.3cm solid and cystic right breast mass, 6 o'clock position, no abnormal right axillary lymph nodes. 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Neoadjuvant chemotherapy with dose dense Adriamycin and Cytoxan x4 followed by Taxol weekly x12 2.Breast conserving surgery with sentinel lymph node biopsy 3.Adjuvant radiation Upbeat clinical trial: No adverse effects due to participation in the study ----------------------------------------------------------------------------------------------------------------------------------------------------- Current treatment: Cycle 1 day 8 dose dense Adriamycin and Cytoxan Echocardiogram: 12/22/2018: EF 60 to 65% Chemo toxicities: 1.  Nausea: Controlled with antiemetics  Return to clinic in 1 week for cycle 2

## 2019-01-06 NOTE — Progress Notes (Signed)
The patient is approved for drug assistance by [employer] for [employer]. Enrollment is effective until 01/05/20 and is based on self-pay.  Drug replacement is begins on DOS 01/01/19.

## 2019-01-12 ENCOUNTER — Encounter (HOSPITAL_COMMUNITY): Payer: Self-pay

## 2019-01-14 ENCOUNTER — Ambulatory Visit: Payer: No Typology Code available for payment source

## 2019-01-14 ENCOUNTER — Other Ambulatory Visit: Payer: No Typology Code available for payment source

## 2019-01-14 ENCOUNTER — Telehealth: Payer: Self-pay | Admitting: Medical Oncology

## 2019-01-14 ENCOUNTER — Other Ambulatory Visit: Payer: Self-pay | Admitting: Medical Oncology

## 2019-01-14 ENCOUNTER — Ambulatory Visit: Payer: No Typology Code available for payment source | Admitting: Hematology and Oncology

## 2019-01-14 DIAGNOSIS — Z171 Estrogen receptor negative status [ER-]: Secondary | ICD-10-CM

## 2019-01-14 DIAGNOSIS — C50311 Malignant neoplasm of lower-inner quadrant of right female breast: Secondary | ICD-10-CM

## 2019-01-14 NOTE — Telephone Encounter (Signed)
UPBEAT LVMOM with patient informing her that the study Lipid panel collected at her baseline visit all resulted within normal limits. Patient thanked and encouraged to call with questions. Return number provided.  [name], RN, BSN, [facility] Clinical Research 01/14/2019 11:25 AM

## 2019-01-14 NOTE — Progress Notes (Signed)
Patient Care Team: [name], MD as PCP - General (Family Medicine) [name], RN as Oncology Nurse Navigator [name], RN as Oncology Nurse Navigator [name], MD as Consulting Physician (General Surgery) [name], MD as Consulting Physician (Hematology and Oncology) [name], MD as Consulting Physician (Radiation Oncology) [name], RN as Registered Nurse (Medical Oncology)  DIAGNOSIS:    ICD-10-CM   1. Malignant neoplasm of lower-inner quadrant of right breast of female, estrogen receptor negative ([place])  C50.311    Z17.1     SUMMARY OF ONCOLOGIC HISTORY: Oncology History  Malignant neoplasm of lower-inner quadrant of right breast of female, estrogen receptor negative ([place])  12/12/2018 Initial Diagnosis   Patient palpated right breast mass. Mammogram showed a 3.3cm solid and cystic right breast mass, 6 o'clock position, no abnormal right axillary lymph nodes. 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    12/17/2018 Cancer Staging   Staging form: Breast, AJCC 8th Edition - Clinical stage from 12/17/2018: Stage IIB (cT2, cN0, cM0, G3, ER-, PR-, HER2-) - Signed by [name], MD on 12/17/2018   12/23/2018 Genetic Testing   Negative genetic testing:  No pathogenic variants detected on the [facility] Multi-Cancer panel. A variant of uncertain significance was detected in the BARD1 gene called c.26_40dup. The report date is 12/23/2018.  The Multi-Cancer Panel offered by [facility] includes sequencing and/or deletion duplication testing of the following 85 genes: AIP, ALK, APC, ATM, AXIN2,BAP1,  BARD1, BLM, BMPR1A, BRCA1, BRCA2, BRIP1, CASR, CDC73, CDH1, CDK4, CDKN1B, CDKN1C, CDKN2A (p14ARF), CDKN2A (p16INK4a), CEBPA, CHEK2, CTNNA1, DICER1, DIS3L2, EGFR (c.2369C>T, p.Thr790Met variant only), EPCAM (Deletion/duplication testing only), FH, FLCN, GATA2, GPC3, GREM1 (Promoter region  deletion/duplication testing only), HOXB13 (c.251G>A, p.Gly84Glu), HRAS, KIT, MAX, MEN1, MET, MITF (c.952G>A, p.Glu318Lys variant only), MLH1, MSH2, MSH3, MSH6, MUTYH, NBN, NF1, NF2, NTHL1, PALB2, PDGFRA, PHOX2B, PMS2, POLD1, POLE, POT1, PRKAR1A, PTCH1, PTEN, RAD50, RAD51C, RAD51D, RB1, RECQL4, RET, RNF43, RUNX1, SDHAF2, SDHA (sequence changes only), SDHB, SDHC, SDHD, SMAD4, SMARCA4, SMARCB1, SMARCE1, STK11, SUFU, TERC, TERT, TMEM127, TP53, TSC1, TSC2, VHL, WRN and WT1.     12/30/2018 -  Chemotherapy   The patient had DOXOrubicin (ADRIAMYCIN) chemo injection 102 mg, 60 mg/m2 = 102 mg, Intravenous,  Once, 1 of 4 cycles Administration: 102 mg (12/30/2018) palonosetron (ALOXI) injection 0.25 mg, 0.25 mg, Intravenous,  Once, 1 of 8 cycles Administration: 0.25 mg (12/30/2018) pegfilgrastim-cbqv (UDENYCA) injection 6 mg, 6 mg, Subcutaneous, Once, 1 of 4 cycles Administration: 6 mg (01/01/2019) CARBOplatin (PARAPLATIN) 500.4 mg in sodium chloride 0.9 % 250 mL chemo infusion, 500.4 mg (100 % of original dose 500.4 mg), Intravenous,  Once, 0 of 4 cycles Dose modification:   (original dose 500.4 mg, Cycle 5) cyclophosphamide (CYTOXAN) 1,020 mg in sodium chloride 0.9 % 250 mL chemo infusion, 600 mg/m2 = 1,020 mg, Intravenous,  Once, 1 of 4 cycles Administration: 1,020 mg (12/30/2018) PACLitaxel (TAXOL) 138 mg in sodium chloride 0.9 % 250 mL chemo infusion (</= 16m/m2), 80 mg/m2 = 138 mg, Intravenous,  Once, 0 of 4 cycles fosaprepitant (EMEND) 150 mg, dexamethasone (DECADRON) 12 mg in sodium chloride 0.9 % 145 mL IVPB, , Intravenous,  Once, 1 of 8 cycles Administration:  (12/30/2018)  for chemotherapy treatment.      CHIEF COMPLIANT: Cycle 2 Adriamycin and Cytoxan  INTERVAL HISTORY: Kara LEFLOREis a 53y.o. 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 She is currently on neoadjuvant chemotherapy with dose  dense Adriamycin and Cytoxan. She is a participant in the UpBeat clinical  trial.She presents to the clinic todayfor cycle 2.  She has recovered very well from the chemotherapy side effects.  She does not have any nausea or vomiting.  Her taste and appetite are excellent.  Epigastric discomfort has resolved with Protonix.  REVIEW OF SYSTEMS:   Constitutional: Denies fevers, chills or abnormal weight loss Eyes: Denies blurriness of vision Ears, nose, mouth, throat, and face: Denies mucositis or sore throat Respiratory: Denies cough, dyspnea or wheezes Cardiovascular: Denies palpitation, chest discomfort Gastrointestinal: Denies nausea, heartburn or change in bowel habits Skin: Denies abnormal skin rashes Lymphatics: Denies new lymphadenopathy or easy bruising Neurological: Denies numbness, tingling or new weaknesses Behavioral/Psych: Mood is stable, no new changes  Extremities: No lower extremity edema Breast: denies any pain or lumps or nodules in either breasts All other systems were reviewed with the patient and are negative.  I have reviewed the past medical history, past surgical history, social history and family history with the patient and they are unchanged from previous note.  ALLERGIES:  has No Known Allergies.  MEDICATIONS:  Current Outpatient Medications  Medication Sig Dispense Refill  . acetaminophen (TYLENOL) 325 MG tablet Take by mouth.    Marland Kitchen b complex vitamins capsule Take by mouth.    Marland Kitchen buPROPion (WELLBUTRIN XL) 150 MG 24 hr tablet Take 1 tablet by mouth daily 90 tablet 3  . CYTOMEL 5 MCG tablet Take 1 tablet by mouth once daily 90 tablet 3  . dexamethasone (DECADRON) 4 MG tablet Take 1 tablet day after chemo and 1 tablet 2 days after chemo with food 8 tablet 0  . lidocaine-prilocaine (EMLA) cream Apply to affected area once 30 g 3  . LORazepam (ATIVAN) 0.5 MG tablet Take 1 tablet (0.5 mg total) by mouth at bedtime as needed for sleep. 30 tablet 0  . Multiple Vitamin (MULTIVITAMIN) capsule Take by mouth.    . ondansetron (ZOFRAN) 8 MG  tablet Take 1 tablet (8 mg total) by mouth 2 (two) times daily as needed. Start on the third day after chemotherapy. 30 tablet 1  . pantoprazole (PROTONIX) 40 MG tablet Take 1 tablet (40 mg total) by mouth daily. 30 tablet 3  . prochlorperazine (COMPAZINE) 10 MG tablet Take 1 tablet (10 mg total) by mouth every 6 (six) hours as needed (Nausea or vomiting). 30 tablet 1  . SYNTHROID 75 MCG tablet Take 1 tablet by mouth once daily 90 tablet 3   No current facility-administered medications for this visit.   Facility-Administered Medications Ordered in Other Visits  Medication Dose Route Frequency Provider Last Rate Last Admin  . sodium chloride flush (NS) 0.9 % injection 10 mL  10 mL Intravenous PRN [name], MD   10 mL at 01/15/19 1135    PHYSICAL EXAMINATION: ECOG PERFORMANCE STATUS: 1 - Symptomatic but completely ambulatory  There were no vitals filed for this visit. There were no vitals filed for this visit.  GENERAL: alert, no distress and comfortable SKIN: skin color, texture, turgor are normal, no rashes or significant lesions EYES: normal, Conjunctiva are pink and non-injected, sclera clear OROPHARYNX: no exudate, no erythema and lips, buccal mucosa, and tongue normal  NECK: supple, thyroid normal size, non-tender, without nodularity LYMPH: no palpable lymphadenopathy in the cervical, axillary or inguinal LUNGS: clear to auscultation and percussion with normal breathing effort HEART: regular rate & rhythm and no murmurs and no lower extremity edema ABDOMEN: abdomen soft, non-tender and normal  bowel sounds MUSCULOSKELETAL: no cyanosis of digits and no clubbing  NEURO: alert & oriented x 3 with fluent speech, no focal motor/sensory deficits EXTREMITIES: No lower extremity edema  LABORATORY DATA:  I have reviewed the data as listed CMP Latest Ref Rng & Units 01/06/2019 12/30/2018 12/17/2018  Glucose 70 - 99 mg/dL 89 92 93  BUN 6 - 20 mg/dL 14 15 17   Creatinine 0.44 - 1.00  mg/dL 0.86 0.81 1.09(H)  Sodium 135 - 145 mmol/L 141 142 143  Potassium 3.5 - 5.1 mmol/L 4.3 3.8 4.4  Chloride 98 - 111 mmol/L 105 109 107  CO2 22 - 32 mmol/L 28 25 26   Calcium 8.9 - 10.3 mg/dL 9.5 8.7(L) 9.8  Total Protein 6.5 - 8.1 g/dL 7.1 6.8 7.6  Total Bilirubin 0.3 - 1.2 mg/dL 0.3 0.2(L) 0.2(L)  Alkaline Phos 38 - 126 U/L 92 75 90  AST 15 - 41 U/L 13(L) 15 17  ALT 0 - 44 U/L 21 13 18     Lab Results  Component Value Date   WBC 7.3 01/15/2019   HGB 12.8 01/15/2019   HCT 39.6 01/15/2019   MCV 91.9 01/15/2019   PLT 480 (H) 01/15/2019   NEUTROABS 4.9 01/15/2019    ASSESSMENT & PLAN:  Malignant neoplasm of lower-inner quadrant of right breast of female, estrogen receptor negative ([place]) 12/12/2018:Patient palpated right breast mass. Mammogram showed a 3.3cm solid and cystic right breast mass, 6 o'clock position, no abnormal right axillary lymph nodes. 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Neoadjuvant chemotherapy with dose dense Adriamycin and Cytoxan x4 followed by Taxol weekly x12 2.Breast conserving surgery with sentinel lymph node biopsy 3.Adjuvant radiation Upbeat clinical trial: No adverse effects due to participation in the study ----------------------------------------------------------------------------------------------------------------------------------------------------- Current treatment: Cycle 2 day 1 dose dense Adriamycin and Cytoxan Echocardiogram: 12/22/2018: EF 60 to 65%  Chemo toxicities: 1.  Nausea: Controlled with antiemetics 2.  Acid reflux: on Protonix: Resolved Patient is gaining weight because of increased appetite.  She will watch what she eats and will increase her fruits and vegetables in her diet.  Return to clinic in 2 weeks for cycle 3  No orders of the defined types were placed in this encounter.  The patient has a good  understanding of the overall plan. she agrees with it. she will call with any problems that may develop before the next visit here.  [name], MD 01/15/2019  Julious Oka Dorshimer, am acting as scribe for Dr. [name].  I have reviewed the above documentation for accuracy and completeness, and I agree with the above.

## 2019-01-15 ENCOUNTER — Telehealth (HOSPITAL_COMMUNITY): Payer: Self-pay | Admitting: *Deleted

## 2019-01-15 ENCOUNTER — Encounter: Payer: Self-pay | Admitting: General Practice

## 2019-01-15 ENCOUNTER — Inpatient Hospital Stay: Payer: Medicaid Other

## 2019-01-15 ENCOUNTER — Inpatient Hospital Stay: Payer: Medicaid Other | Admitting: General Practice

## 2019-01-15 ENCOUNTER — Other Ambulatory Visit: Payer: Self-pay

## 2019-01-15 ENCOUNTER — Inpatient Hospital Stay (HOSPITAL_BASED_OUTPATIENT_CLINIC_OR_DEPARTMENT_OTHER): Payer: Medicaid Other | Admitting: Hematology and Oncology

## 2019-01-15 ENCOUNTER — Encounter: Payer: Self-pay | Admitting: Hematology and Oncology

## 2019-01-15 DIAGNOSIS — C50311 Malignant neoplasm of lower-inner quadrant of right female breast: Secondary | ICD-10-CM

## 2019-01-15 DIAGNOSIS — Z171 Estrogen receptor negative status [ER-]: Secondary | ICD-10-CM | POA: Diagnosis not present

## 2019-01-15 DIAGNOSIS — Z5111 Encounter for antineoplastic chemotherapy: Secondary | ICD-10-CM | POA: Diagnosis not present

## 2019-01-15 DIAGNOSIS — Z95828 Presence of other vascular implants and grafts: Secondary | ICD-10-CM

## 2019-01-15 LAB — CMP (CANCER CENTER ONLY)
ALT: 15 U/L (ref 0–44)
AST: 15 U/L (ref 15–41)
Albumin: 4 g/dL (ref 3.5–5.0)
Alkaline Phosphatase: 112 U/L (ref 38–126)
Anion gap: 10 (ref 5–15)
BUN: 15 mg/dL (ref 6–20)
CO2: 25 mmol/L (ref 22–32)
Calcium: 9.2 mg/dL (ref 8.9–10.3)
Chloride: 109 mmol/L (ref 98–111)
Creatinine: 0.81 mg/dL (ref 0.44–1.00)
GFR, Est AFR Am: >60 mL/min (ref >60)
GFR, Estimated: >60 mL/min (ref >60)
Glucose, Bld: 99 mg/dL (ref 70–99)
Potassium: 4.2 mmol/L (ref 3.5–5.1)
Sodium: 144 mmol/L (ref 135–145)
Total Bilirubin: <0.2 mg/dL — ABNORMAL LOW (ref 0.3–1.2)
Total Protein: 7.2 g/dL (ref 6.5–8.1)

## 2019-01-15 LAB — CBC WITH DIFFERENTIAL (CANCER CENTER ONLY)
Abs Immature Granulocytes: 0.1 10*3/uL — ABNORMAL HIGH (ref 0.00–0.07)
Basophils Absolute: 0 10*3/uL (ref 0.0–0.1)
Basophils Relative: 1 %
Eosinophils Absolute: 0 10*3/uL (ref 0.0–0.5)
Eosinophils Relative: 0 %
HCT: 39.6 % (ref 36.0–46.0)
Hemoglobin: 12.8 g/dL (ref 12.0–15.0)
Immature Granulocytes: 1 %
Lymphocytes Relative: 24 %
Lymphs Abs: 1.8 10*3/uL (ref 0.7–4.0)
MCH: 29.7 pg (ref 26.0–34.0)
MCHC: 32.3 g/dL (ref 30.0–36.0)
MCV: 91.9 fL (ref 80.0–100.0)
Monocytes Absolute: 0.5 10*3/uL (ref 0.1–1.0)
Monocytes Relative: 6 %
Neutro Abs: 4.9 10*3/uL (ref 1.7–7.7)
Neutrophils Relative %: 68 %
Platelet Count: 480 10*3/uL — ABNORMAL HIGH (ref 150–400)
RBC: 4.31 MIL/uL (ref 3.87–5.11)
RDW: 12.8 % (ref 11.5–15.5)
WBC Count: 7.3 10*3/uL (ref 4.0–10.5)
nRBC: 0 % (ref 0.0–0.2)

## 2019-01-15 MED ORDER — SODIUM CHLORIDE 0.9% FLUSH
10.0000 mL | INTRAVENOUS | Status: DC | PRN
  Administered 2019-01-15: 15:00:00 10 mL
  Filled 2019-01-15: qty 10

## 2019-01-15 MED ORDER — PROMETHAZINE HCL 25 MG RE SUPP: 25.0000 mg | Freq: Four times a day (QID) | RECTAL | 1 refills | Status: DC | PRN

## 2019-01-15 MED ORDER — SODIUM CHLORIDE 0.9% FLUSH
10.0000 mL | INTRAVENOUS | Status: DC | PRN
  Administered 2019-01-15: 10 mL via INTRAVENOUS
  Filled 2019-01-15: qty 10

## 2019-01-15 MED ORDER — SODIUM CHLORIDE 0.9 % IV SOLN
600.0000 mg/m2 | Freq: Once | INTRAVENOUS | Status: AC
  Administered 2019-01-15: 1020 mg via INTRAVENOUS
  Filled 2019-01-15: qty 51

## 2019-01-15 MED ORDER — PALONOSETRON HCL INJECTION 0.25 MG/5ML
0.2500 mg | Freq: Once | INTRAVENOUS | Status: AC
  Administered 2019-01-15: 13:00:00 0.25 mg via INTRAVENOUS

## 2019-01-15 MED ORDER — SODIUM CHLORIDE 0.9 % IV SOLN
Freq: Once | INTRAVENOUS | Status: AC
  Administered 2019-01-15: 13:00:00 via INTRAVENOUS
  Filled 2019-01-15: qty 250

## 2019-01-15 MED ORDER — HEPARIN SOD (PORK) LOCK FLUSH 100 UNIT/ML IV SOLN
500.0000 [IU] | Freq: Once | INTRAVENOUS | Status: AC | PRN
  Administered 2019-01-15: 500 [IU]
  Filled 2019-01-15: qty 5

## 2019-01-15 MED ORDER — SODIUM CHLORIDE 0.9 % IV SOLN
Freq: Once | INTRAVENOUS | Status: AC
  Administered 2019-01-15: 13:00:00 via INTRAVENOUS
  Filled 2019-01-15: qty 5

## 2019-01-15 MED ORDER — DOXORUBICIN HCL CHEMO IV INJECTION 2 MG/ML
60.0000 mg/m2 | Freq: Once | INTRAVENOUS | Status: AC
  Administered 2019-01-15: 14:00:00 102 mg via INTRAVENOUS
  Filled 2019-01-15: qty 51

## 2019-01-15 MED ORDER — PALONOSETRON HCL INJECTION 0.25 MG/5ML
INTRAVENOUS | Status: AC
  Filled 2019-01-15: qty 5

## 2019-01-15 NOTE — Progress Notes (Signed)
[facility] CSW Progress Notes  Met w patient in infusion at request of Dr Lindi Adie.  CSW asked to follow up on depression score of "18" from UPBEAT study, indicating need for assessment of depressive symptoms.  Patient confirmed that her answers on screener were correct - she was experiencing significant difficulty w concentration, depressed mooed, hopelessness, difficult sleeping, anxiety during the week prior to the administration of the screener.  At that time, close to initial diagnosis, patient had many appointments, scans, worries.  Felt overwhelmed by initial diagnosis which came as a shock.  Was also overwhelmed by the volume of information given during initial appointments.  Was having difficulty processing diagnosis, treatment plan and impact on her life.  She had also just started a new job and felt pressure to perform well in that setting in order to retain her position.  She had experienced three deaths in the past year, including the sudden and unexpected death of her son.  She is experiencing financial distress due to working reduced hours as she deals w treatments and side effects.  This creates additional strain on her.    Provided patient information on supportive resources including Support Center calendar, [employer], and counseling intern for ongoing support.  Discussed outside financial resources including [facility] [place], [place] and [place].  Provided applications for these.  Per Estate manager/land agent, she has already seen client and would like client to call her to discuss [place] - provided [facility] contact information to patient   Patient unclear whether she has [facility] Medicaid - Asked C [place] to call patient to clarify her Medicaid status.    Will meet w patient at next appt 12/23 to receive and submit completed applications for outside financial assistance if she desires.  MD notified of patient's depression status by staff message.  Edwyna Shell, LCSW Clinical Social  Worker Phone:  [phone]

## 2019-01-15 NOTE — Telephone Encounter (Signed)
Received a message from Darrick Meigs Social Worker at the [employer] that patient has questions about her Medicaid. Attempted to call patient back. No one answered phone and patients voicemail box is full. Unable to leave voicemail message for patient to return my call.

## 2019-01-15 NOTE — Patient Instructions (Signed)
[facility] Discharge Instructions for Patients Receiving Chemotherapy  Today you received the following chemotherapy agents Doxorubicin, Cytoxan.  To help prevent nausea and vomiting after your treatment, we encourage you to take your nausea medication. DO NOT TAKE ZOFRAN FOR THREE DAYS AFTER TREATMENT.   If you develop nausea and vomiting that is not controlled by your nausea medication, call the clinic.   BELOW ARE SYMPTOMS THAT SHOULD BE REPORTED IMMEDIATELY:  *FEVER GREATER THAN 100.5 F  *CHILLS WITH OR WITHOUT FEVER  NAUSEA AND VOMITING THAT IS NOT CONTROLLED WITH YOUR NAUSEA MEDICATION  *UNUSUAL SHORTNESS OF BREATH  *UNUSUAL BRUISING OR BLEEDING  TENDERNESS IN MOUTH AND THROAT WITH OR WITHOUT PRESENCE OF ULCERS  *URINARY PROBLEMS  *BOWEL PROBLEMS  UNUSUAL RASH Items with * indicate a potential emergency and should be followed up as soon as possible.  Feel free to call the clinic should you have any questions or concerns. The clinic phone number is [phone].  Please show the [facility] at check-in to the Emergency Department and triage nurse.  Cyclophosphamide injection What is this medicine? CYCLOPHOSPHAMIDE (sye kloe FOSS fa mide) is a chemotherapy drug. It slows the growth of cancer cells. This medicine is used to treat many types of cancer like lymphoma, myeloma, leukemia, breast cancer, and ovarian cancer, to name a few. This medicine may be used for other purposes; ask your health care provider or pharmacist if you have questions. COMMON BRAND NAME(S): Cytoxan, Neosar What should I tell my health care provider before I take this medicine? They need to know if you have any of these conditions:  blood disorders  history of other chemotherapy  infection  kidney disease  liver disease  recent or ongoing radiation therapy  tumors in the bone marrow  an unusual or allergic reaction to cyclophosphamide, other chemotherapy, other  medicines, foods, dyes, or preservatives  pregnant or trying to get pregnant  breast-feeding How should I use this medicine? This drug is usually given as an injection into a vein or muscle or by infusion into a vein. It is administered in a hospital or clinic by a specially trained health care professional. Talk to your pediatrician regarding the use of this medicine in children. Special care may be needed. Overdosage: If you think you have taken too much of this medicine contact a poison control center or emergency room at once. NOTE: This medicine is only for you. Do not share this medicine with others. What if I miss a dose? It is important not to miss your dose. Call your doctor or health care professional if you are unable to keep an appointment. What may interact with this medicine? This medicine may interact with the following medications:  amiodarone  amphotericin B  azathioprine  certain antiviral medicines for HIV or AIDS such as protease inhibitors (e.g., indinavir, ritonavir) and zidovudine  certain blood pressure medications such as benazepril, captopril, enalapril, fosinopril, lisinopril, moexipril, monopril, perindopril, quinapril, ramipril, trandolapril  certain cancer medications such as anthracyclines (e.g., daunorubicin, doxorubicin), busulfan, cytarabine, paclitaxel, pentostatin, tamoxifen, trastuzumab  certain diuretics such as chlorothiazide, chlorthalidone, hydrochlorothiazide, indapamide, metolazone  certain medicines that treat or prevent blood clots like warfarin  certain muscle relaxants such as succinylcholine  cyclosporine  etanercept  indomethacin  medicines to increase blood counts like filgrastim, pegfilgrastim, sargramostim  medicines used as general anesthesia  metronidazole  natalizumab This list may not describe all possible interactions. Give your health care provider a list of all the medicines,  herbs, non-prescription drugs, or  dietary supplements you use. Also tell them if you smoke, drink alcohol, or use illegal drugs. Some items may interact with your medicine. What should I watch for while using this medicine? Visit your doctor for checks on your progress. This drug may make you feel generally unwell. This is not uncommon, as chemotherapy can affect healthy cells as well as cancer cells. Report any side effects. Continue your course of treatment even though you feel ill unless your doctor tells you to stop. Drink water or other fluids as directed. Urinate often, even at night. In some cases, you may be given additional medicines to help with side effects. Follow all directions for their use. Call your doctor or health care professional for advice if you get a fever, chills or sore throat, or other symptoms of a cold or flu. Do not treat yourself. This drug decreases your body's ability to fight infections. Try to avoid being around people who are sick. This medicine may increase your risk to bruise or bleed. Call your doctor or health care professional if you notice any unusual bleeding. Be careful brushing and flossing your teeth or using a toothpick because you may get an infection or bleed more easily. If you have any dental work done, tell your dentist you are receiving this medicine. You may get drowsy or dizzy. Do not drive, use machinery, or do anything that needs mental alertness until you know how this medicine affects you. Do not become pregnant while taking this medicine or for 1 year after stopping it. Women should inform their doctor if they wish to become pregnant or think they might be pregnant. Men should not father a child while taking this medicine and for 4 months after stopping it. There is a potential for serious side effects to an unborn child. Talk to your health care professional or pharmacist for more information. Do not breast-feed an infant while taking this medicine. This medicine may interfere  with the ability to have a child. This medicine has caused ovarian failure in some women. This medicine has caused reduced sperm counts in some men. You should talk with your doctor or health care professional if you are concerned about your fertility. If you are going to have surgery, tell your doctor or health care professional that you have taken this medicine. What side effects may I notice from receiving this medicine? Side effects that you should report to your doctor or health care professional as soon as possible:  allergic reactions like skin rash, itching or hives, swelling of the face, lips, or tongue  low blood counts - this medicine may decrease the number of white blood cells, red blood cells and platelets. You may be at increased risk for infections and bleeding.  signs of infection - fever or chills, cough, sore throat, pain or difficulty passing urine  signs of decreased platelets or bleeding - bruising, pinpoint red spots on the skin, black, tarry stools, blood in the urine  signs of decreased red blood cells - unusually weak or tired, fainting spells, lightheadedness  breathing problems  dark urine  dizziness  palpitations  swelling of the ankles, feet, hands  trouble passing urine or change in the amount of urine  weight gain  yellowing of the eyes or skin Side effects that usually do not require medical attention (report to your doctor or health care professional if they continue or are bothersome):  changes in nail or skin color  hair loss  missed menstrual periods  mouth sores  nausea, vomiting This list may not describe all possible side effects. Call your doctor for medical advice about side effects. You may report side effects to FDA at 1-800-FDA-1088. Where should I keep my medicine? This drug is given in a hospital or clinic and will not be stored at home. NOTE: This sheet is a summary. It may not cover all possible information. If you have  questions about this medicine, talk to your doctor, pharmacist, or health care provider.  2020 Elsevier/Gold Standard (2011-12-07 16:22:58) Doxorubicin injection What is this medicine? DOXORUBICIN (dox oh ROO bi sin) is a chemotherapy drug. It is used to treat many kinds of cancer like leukemia, lymphoma, neuroblastoma, sarcoma, and Wilms' tumor. It is also used to treat bladder cancer, breast cancer, lung cancer, ovarian cancer, stomach cancer, and thyroid cancer. This medicine may be used for other purposes; ask your health care provider or pharmacist if you have questions. COMMON BRAND NAME(S): Adriamycin, Adriamycin PFS, Adriamycin RDF, Rubex What should I tell my health care provider before I take this medicine? They need to know if you have any of these conditions:  heart disease  history of low blood counts caused by a medicine  liver disease  recent or ongoing radiation therapy  an unusual or allergic reaction to doxorubicin, other chemotherapy agents, other medicines, foods, dyes, or preservatives  pregnant or trying to get pregnant  breast-feeding How should I use this medicine? This drug is given as an infusion into a vein. It is administered in a hospital or clinic by a specially trained health care professional. If you have pain, swelling, burning or any unusual feeling around the site of your injection, tell your health care professional right away. Talk to your pediatrician regarding the use of this medicine in children. Special care may be needed. Overdosage: If you think you have taken too much of this medicine contact a poison control center or emergency room at once. NOTE: This medicine is only for you. Do not share this medicine with others. What if I miss a dose? It is important not to miss your dose. Call your doctor or health care professional if you are unable to keep an appointment. What may interact with this medicine? This medicine may interact with the  following medications:  6-mercaptopurine  paclitaxel  phenytoin  St. John's Wort  trastuzumab  verapamil This list may not describe all possible interactions. Give your health care provider a list of all the medicines, herbs, non-prescription drugs, or dietary supplements you use. Also tell them if you smoke, drink alcohol, or use illegal drugs. Some items may interact with your medicine. What should I watch for while using this medicine? This drug may make you feel generally unwell. This is not uncommon, as chemotherapy can affect healthy cells as well as cancer cells. Report any side effects. Continue your course of treatment even though you feel ill unless your doctor tells you to stop. There is a maximum amount of this medicine you should receive throughout your life. The amount depends on the medical condition being treated and your overall health. Your doctor will watch how much of this medicine you receive in your lifetime. Tell your doctor if you have taken this medicine before. You may need blood work done while you are taking this medicine. Your urine may turn red for a few days after your dose. This is not blood. If your urine is dark or brown, call your doctor. In  some cases, you may be given additional medicines to help with side effects. Follow all directions for their use. Call your doctor or health care professional for advice if you get a fever, chills or sore throat, or other symptoms of a cold or flu. Do not treat yourself. This drug decreases your body's ability to fight infections. Try to avoid being around people who are sick. This medicine may increase your risk to bruise or bleed. Call your doctor or health care professional if you notice any unusual bleeding. Talk to your doctor about your risk of cancer. You may be more at risk for certain types of cancers if you take this medicine. Do not become pregnant while taking this medicine or for 6 months after stopping it.  Women should inform their doctor if they wish to become pregnant or think they might be pregnant. Men should not father a child while taking this medicine and for 6 months after stopping it. There is a potential for serious side effects to an unborn child. Talk to your health care professional or pharmacist for more information. Do not breast-feed an infant while taking this medicine. This medicine has caused ovarian failure in some women and reduced sperm counts in some men This medicine may interfere with the ability to have a child. Talk with your doctor or health care professional if you are concerned about your fertility. This medicine may cause a decrease in Co-Enzyme Q-10. You should make sure that you get enough Co-Enzyme Q-10 while you are taking this medicine. Discuss the foods you eat and the vitamins you take with your health care professional. What side effects may I notice from receiving this medicine? Side effects that you should report to your doctor or health care professional as soon as possible:  allergic reactions like skin rash, itching or hives, swelling of the face, lips, or tongue  breathing problems  chest pain  fast or irregular heartbeat  low blood counts - this medicine may decrease the number of white blood cells, red blood cells and platelets. You may be at increased risk for infections and bleeding.  pain, redness, or irritation at site where injected  signs of infection - fever or chills, cough, sore throat, pain or difficulty passing urine  signs of decreased platelets or bleeding - bruising, pinpoint red spots on the skin, black, tarry stools, blood in the urine  swelling of the ankles, feet, hands  tiredness  weakness Side effects that usually do not require medical attention (report to your doctor or health care professional if they continue or are bothersome):  diarrhea  hair loss  mouth sores  nail discoloration or damage  nausea  red  colored urine  vomiting This list may not describe all possible side effects. Call your doctor for medical advice about side effects. You may report side effects to FDA at 1-800-FDA-1088. Where should I keep my medicine? This drug is given in a hospital or clinic and will not be stored at home. NOTE: This sheet is a summary. It may not cover all possible information. If you have questions about this medicine, talk to your doctor, pharmacist, or health care provider.  2020 Elsevier/Gold Standard (2016-09-05 11:01:26)

## 2019-01-15 NOTE — Assessment & Plan Note (Signed)
12/12/2018:Patient palpated right breast mass. Mammogram showed a 3.3cm solid and cystic right breast mass, 6 o'clock position, no abnormal right axillary lymph nodes. 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  Nausea: Controlled with antiemetics 2.  Acid reflux: Sent prescription for Protonix Labs reviewed ANC 0.7 We will keep the dose of the same for treatment next week.  Return to clinic in 2 weeks for cycle 3 

## 2019-01-15 NOTE — Progress Notes (Signed)
Received call from patient referred by social worker to discuss financial concerns. Advised patient what is needed to apply for grant. Patient had employer to email income information and I have received it.   Met with patient in lobby at registration desk to go over grant details. Patient approved for one-time $1000 [employer] grant to assist with personal expenses while going through treatment. She has a copy of the approval letter as well as the expense sheet along with the Outpatient pharmacy information. She received a gas card today from grant.  She has my card for any additional financial questions or concerns.

## 2019-01-16 ENCOUNTER — Ambulatory Visit: Payer: No Typology Code available for payment source

## 2019-01-17 ENCOUNTER — Inpatient Hospital Stay: Payer: Medicaid Other

## 2019-01-17 ENCOUNTER — Other Ambulatory Visit: Payer: Self-pay

## 2019-01-17 VITALS — BP 128/89 | HR 88 | Temp 97.7°F | Resp 17

## 2019-01-17 DIAGNOSIS — Z5111 Encounter for antineoplastic chemotherapy: Secondary | ICD-10-CM | POA: Diagnosis not present

## 2019-01-17 DIAGNOSIS — C50311 Malignant neoplasm of lower-inner quadrant of right female breast: Secondary | ICD-10-CM

## 2019-01-17 DIAGNOSIS — Z171 Estrogen receptor negative status [ER-]: Secondary | ICD-10-CM

## 2019-01-17 MED ORDER — PEGFILGRASTIM-CBQV 6 MG/0.6ML ~~LOC~~ SOSY
6.0000 mg | PREFILLED_SYRINGE | Freq: Once | SUBCUTANEOUS | Status: AC
  Administered 2019-01-17: 6 mg via SUBCUTANEOUS

## 2019-01-20 ENCOUNTER — Telehealth: Payer: Self-pay | Admitting: Medical Oncology

## 2019-01-20 NOTE — Telephone Encounter (Signed)
Call to patient to see how she was and to inform her that the UPBEAT month-1 labs will be collected at her lab appointment next week on the 23rd, patient gave verbal understanding and inquired as to how many tubes would be collected. I informed patient that there would be two tubes in addition to what Dr. Lindi Adie has ordered for that treatment day. I also inquired with her about the DCP-001 study and whether she was interested in participating. Patient was provided with consent form to review on the 17th of November. Patient asked to wait till next week to provide me with a response regarding DCP. I thanked patient for her time and encouraged her to call Dr. Lindi Adie or myself with questions or concerns.  [name], RN, BSN, [facility] Clinical Research 01/20/2019 5:06 PM

## 2019-01-27 NOTE — Progress Notes (Signed)
Patient Care Team: [name], MD as PCP - General (Family Medicine) [name], RN as Oncology Nurse Navigator [name], RN as Oncology Nurse Navigator [name], MD as Consulting Physician (General Surgery) [name], MD as Consulting Physician (Hematology and Oncology) [name], MD as Consulting Physician (Radiation Oncology) [name], RN as Registered Nurse (Medical Oncology)  DIAGNOSIS:    ICD-10-CM   1. Malignant neoplasm of lower-inner quadrant of right breast of female, estrogen receptor negative ([place])  C50.311 DISCONTINUED: DOXOrubicin (ADRIAMYCIN) chemo injection 86 mg   Z17.1 DISCONTINUED: cyclophosphamide (CYTOXAN) 860 mg in sodium chloride 0.9 % 250 mL chemo infusion    SUMMARY OF ONCOLOGIC HISTORY: Oncology History  Malignant neoplasm of lower-inner quadrant of right breast of female, estrogen receptor negative ([place])  12/12/2018 Initial Diagnosis   Patient palpated right breast mass. Mammogram showed a 3.3cm solid and cystic right breast mass, 6 o'clock position, no abnormal right axillary lymph nodes. 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    12/17/2018 Cancer Staging   Staging form: Breast, AJCC 8th Edition - Clinical stage from 12/17/2018: Stage IIB (cT2, cN0, cM0, G3, ER-, PR-, HER2-) - Signed by [name], MD on 12/17/2018   12/23/2018 Genetic Testing   Negative genetic testing:  No pathogenic variants detected on the [facility] Multi-Cancer panel. A variant of uncertain significance was detected in the BARD1 gene called c.26_40dup. The report date is 12/23/2018.  The Multi-Cancer Panel offered by [facility] includes sequencing and/or deletion duplication testing of the following 85 genes: AIP, ALK, APC, ATM, AXIN2,BAP1,  BARD1, BLM, BMPR1A, BRCA1, BRCA2, BRIP1, CASR, CDC73, CDH1, CDK4, CDKN1B, CDKN1C, CDKN2A (p14ARF), CDKN2A (p16INK4a), CEBPA, CHEK2, CTNNA1, DICER1,  DIS3L2, EGFR (c.2369C>T, p.Thr790Met variant only), EPCAM (Deletion/duplication testing only), FH, FLCN, GATA2, GPC3, GREM1 (Promoter region deletion/duplication testing only), HOXB13 (c.251G>A, p.Gly84Glu), HRAS, KIT, MAX, MEN1, MET, MITF (c.952G>A, p.Glu318Lys variant only), MLH1, MSH2, MSH3, MSH6, MUTYH, NBN, NF1, NF2, NTHL1, PALB2, PDGFRA, PHOX2B, PMS2, POLD1, POLE, POT1, PRKAR1A, PTCH1, PTEN, RAD50, RAD51C, RAD51D, RB1, RECQL4, RET, RNF43, RUNX1, SDHAF2, SDHA (sequence changes only), SDHB, SDHC, SDHD, SMAD4, SMARCA4, SMARCB1, SMARCE1, STK11, SUFU, TERC, TERT, TMEM127, TP53, TSC1, TSC2, VHL, WRN and WT1.     12/30/2018 -  Chemotherapy   The patient had DOXOrubicin (ADRIAMYCIN) chemo injection 102 mg, 60 mg/m2 = 102 mg, Intravenous,  Once, 3 of 4 cycles Dose modification: 50 mg/m2 (original dose 60 mg/m2, Cycle 3, Reason: Dose not tolerated) Administration: 102 mg (12/30/2018), 102 mg (01/15/2019) palonosetron (ALOXI) injection 0.25 mg, 0.25 mg, Intravenous,  Once, 3 of 8 cycles Administration: 0.25 mg (12/30/2018), 0.25 mg (01/15/2019) pegfilgrastim-cbqv (UDENYCA) injection 6 mg, 6 mg, Subcutaneous, Once, 3 of 4 cycles Administration: 6 mg (01/01/2019), 6 mg (01/17/2019) CARBOplatin (PARAPLATIN) 500.4 mg in sodium chloride 0.9 % 250 mL chemo infusion, 500.4 mg (100 % of original dose 500.4 mg), Intravenous,  Once, 0 of 4 cycles Dose modification:   (original dose 500.4 mg, Cycle 5) cyclophosphamide (CYTOXAN) 1,020 mg in sodium chloride 0.9 % 250 mL chemo infusion, 600 mg/m2 = 1,020 mg, Intravenous,  Once, 3 of 4 cycles Dose modification: 500 mg/m2 (original dose 600 mg/m2, Cycle 3, Reason: Dose not tolerated) Administration: 1,020 mg (12/30/2018), 1,020 mg (01/15/2019) PACLitaxel (TAXOL) 138 mg in sodium chloride 0.9 % 250 mL chemo infusion (</= 62m/m2), 80 mg/m2 = 138 mg, Intravenous,  Once, 0 of 4 cycles fosaprepitant (EMEND) 150 mg, dexamethasone (DECADRON) 12 mg in sodium chloride  0.9 % 145  mL IVPB, , Intravenous,  Once, 3 of 8 cycles Administration:  (12/30/2018),  (01/15/2019)  for chemotherapy treatment.      CHIEF COMPLIANT: Cycle 3Adriamycin and Cytoxan  INTERVAL HISTORY: Kara Pittman is a 53 y.o. 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 She is currently on neoadjuvant chemotherapy with dose dense Adriamycin and Cytoxan. She is a participant in the UpBeat clinical trial.She presents to the clinic todayforcycle 3.  Patient has been experiencing profound nausea and vomiting that has been extremely severe for the first 3 days and then persistent for the next 2 weeks.  She has lost her hair completely.  REVIEW OF SYSTEMS:   Constitutional: Denies fevers, chills or abnormal weight loss Eyes: Denies blurriness of vision Ears, nose, mouth, throat, and face: Denies mucositis or sore throat Respiratory: Denies cough, dyspnea or wheezes Cardiovascular: Denies palpitation, chest discomfort Gastrointestinal: Severe nausea and vomiting Skin: Denies abnormal skin rashes Lymphatics: Denies new lymphadenopathy or easy bruising Neurological: Denies numbness, tingling or new weaknesses Behavioral/Psych: Mood is stable, no new changes  Extremities: No lower extremity edema Breast: denies any pain or lumps or nodules in either breasts All other systems were reviewed with the patient and are negative.  I have reviewed the past medical history, past surgical history, social history and family history with the patient and they are unchanged from previous note.  ALLERGIES:  has No Known Allergies.  MEDICATIONS:  Current Outpatient Medications  Medication Sig Dispense Refill  . acetaminophen (TYLENOL) 325 MG tablet Take by mouth.    Marland Kitchen b complex vitamins capsule Take by mouth.    Marland Kitchen buPROPion (WELLBUTRIN XL) 150 MG 24 hr tablet Take 1 tablet by mouth daily 90 tablet 3  . CYTOMEL 5 MCG tablet Take 1 tablet by mouth once daily 90 tablet 3  . dexamethasone  (DECADRON) 4 MG tablet Take 1 tablet day after chemo and 1 tablet 2 days after chemo with food 8 tablet 0  . lidocaine-prilocaine (EMLA) cream Apply to affected area once 30 g 3  . LORazepam (ATIVAN) 0.5 MG tablet Take 1 tablet (0.5 mg total) by mouth at bedtime as needed for sleep. 30 tablet 0  . Multiple Vitamin (MULTIVITAMIN) capsule Take by mouth.    . ondansetron (ZOFRAN-ODT) 8 MG disintegrating tablet Take 1 tablet (8 mg total) by mouth every 8 (eight) hours as needed for nausea or vomiting. 20 tablet 3  . pantoprazole (PROTONIX) 40 MG tablet Take 1 tablet (40 mg total) by mouth daily. 30 tablet 3  . prochlorperazine (COMPAZINE) 10 MG tablet Take 1 tablet (10 mg total) by mouth every 6 (six) hours as needed (Nausea or vomiting). 30 tablet 1  . promethazine (PHENERGAN) 25 MG suppository Place 1 suppository (25 mg total) rectally every 6 (six) hours as needed for nausea or vomiting. 12 each 1  . SYNTHROID 75 MCG tablet Take 1 tablet by mouth once daily 90 tablet 3   No current facility-administered medications for this visit.   Facility-Administered Medications Ordered in Other Visits  Medication Dose Route Frequency Provider Last Rate Last Admin  . cyclophosphamide (CYTOXAN) 860 mg in sodium chloride 0.9 % 250 mL chemo infusion  500 mg/m2 (Treatment Plan Recorded) Intravenous Once [name], MD      . DOXOrubicin (ADRIAMYCIN) chemo injection 86 mg  50 mg/m2 (Treatment Plan Recorded) Intravenous Once [name], MD      . fosaprepitant (EMEND) 150 mg, dexamethasone (DECADRON) 12 mg in sodium  chloride 0.9 % 145 mL IVPB   Intravenous Once [name], MD 454 mL/hr at 01/28/19 1612 New Bag at 01/28/19 1612  . heparin lock flush 100 unit/mL  500 Units Intracatheter Once PRN [name], MD      . sodium chloride flush (NS) 0.9 % injection 10 mL  10 mL Intracatheter PRN [name], MD        PHYSICAL EXAMINATION: ECOG PERFORMANCE STATUS: 1 - Symptomatic but completely  ambulatory  Vitals:   01/28/19 1500  BP: (!) 135/101  Pulse: 95  Resp: 17  Temp: 98.7 F (37.1 C)  SpO2: 100%   Filed Weights   01/28/19 1500  Weight: 144 lb 11.2 oz (65.6 kg)    GENERAL: alert, no distress and comfortable SKIN: skin color, texture, turgor are normal, no rashes or significant lesions EYES: normal, Conjunctiva are pink and non-injected, sclera clear OROPHARYNX: no exudate, no erythema and lips, buccal mucosa, and tongue normal  NECK: supple, thyroid normal size, non-tender, without nodularity LYMPH: no palpable lymphadenopathy in the cervical, axillary or inguinal LUNGS: clear to auscultation and percussion with normal breathing effort HEART: regular rate & rhythm and no murmurs and no lower extremity edema ABDOMEN: abdomen soft, non-tender and normal bowel sounds MUSCULOSKELETAL: no cyanosis of digits and no clubbing  NEURO: alert & oriented x 3 with fluent speech, no focal motor/sensory deficits EXTREMITIES: No lower extremity edema  LABORATORY DATA:  I have reviewed the data as listed CMP Latest Ref Rng & Units 01/28/2019 01/15/2019 01/06/2019  Glucose 70 - 99 mg/dL 93 99 89  BUN 6 - 20 mg/dL _0 Creatinine 0.44 - 1.00 mg/dL 0.83 0.81 0.86  Sodium 135 - 145 mmol/L 143 144 141  Potassium 3.5 - 5.1 mmol/L 4.1 4.2 4.3  Chloride 98 - 111 mmol/L 107 109 105  CO2 22 - 32 mmol/L _1 Calcium 8.9 - 10.3 mg/dL 9.1 9.2 9.5  Total Protein 6.5 - 8.1 g/dL 7.0 7.2 7.1  Total Bilirubin 0.3 - 1.2 mg/dL <0.2(L) <0.2(L) 0.3  Alkaline Phos 38 - 126 U/L 108 112 92  AST 15 - 41 U/L 14(L) 15 13(L)  ALT 0 - 44 U/L _2 Lab Results  Component Value Date   WBC 11.0 (H) 01/28/2019   HGB 11.5 (L) 01/28/2019   HCT 35.4 (L) 01/28/2019   MCV 92.9 01/28/2019   PLT 211 01/28/2019   NEUTROABS 7.4 01/28/2019    ASSESSMENT & PLAN:  Malignant neoplasm of lower-inner quadrant of right breast of female, estrogen receptor negative ([place]) 12/12/2018:Patient  palpated right breast mass. Mammogram showed a 3.3cm solid and cystic right breast mass, 6 o'clock position, no abnormal right axillary lymph nodes. 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Neoadjuvant chemotherapy with dose dense Adriamycin and Cytoxan x4 followed by Taxol weekly x12 2.Breast conserving surgery with sentinel lymph node biopsy 3.Adjuvant radiation Upbeat clinical trial: No adverse effects due to participation in the study ----------------------------------------------------------------------------------------------------------------------------------------------------- Current treatment: Cycle 3 day1dose dense Adriamycin and Cytoxan Echocardiogram: 12/22/2018: EF 60 to 65%  Chemo toxicities: 1.Severe nausea and vomiting: I will reduce the dosage of cycle 3 of chemo. 2.Acid reflux: on Protonix: Resolved 3.  Alopecia  Return to clinic in 2 weeks for cycle 4   No orders of the defined types were placed in this encounter.  The patient has a good understanding of  the overall plan. she agrees with it. she will call with any problems that may develop before the next visit here.  [name], MD 01/28/2019  Julious Oka Dorshimer, am acting as scribe for Dr. [name].  I have reviewed the above document for accuracy and completeness, and I agree with the above.

## 2019-01-28 ENCOUNTER — Inpatient Hospital Stay: Payer: Medicaid Other

## 2019-01-28 ENCOUNTER — Telehealth (HOSPITAL_COMMUNITY): Payer: Self-pay | Admitting: *Deleted

## 2019-01-28 ENCOUNTER — Encounter: Payer: Self-pay | Admitting: Medical Oncology

## 2019-01-28 ENCOUNTER — Inpatient Hospital Stay: Payer: Medicaid Other | Admitting: General Practice

## 2019-01-28 ENCOUNTER — Inpatient Hospital Stay (HOSPITAL_BASED_OUTPATIENT_CLINIC_OR_DEPARTMENT_OTHER): Payer: Medicaid Other | Admitting: Hematology and Oncology

## 2019-01-28 ENCOUNTER — Other Ambulatory Visit: Payer: Self-pay

## 2019-01-28 VITALS — BP 119/88 | HR 91

## 2019-01-28 DIAGNOSIS — Z171 Estrogen receptor negative status [ER-]: Secondary | ICD-10-CM

## 2019-01-28 DIAGNOSIS — Z5111 Encounter for antineoplastic chemotherapy: Secondary | ICD-10-CM | POA: Diagnosis not present

## 2019-01-28 DIAGNOSIS — C50311 Malignant neoplasm of lower-inner quadrant of right female breast: Secondary | ICD-10-CM

## 2019-01-28 LAB — CBC WITH DIFFERENTIAL (CANCER CENTER ONLY)
Abs Immature Granulocytes: 1.08 10*3/uL — ABNORMAL HIGH (ref 0.00–0.07)
Basophils Absolute: 0.1 10*3/uL (ref 0.0–0.1)
Basophils Relative: 1 %
Eosinophils Absolute: 0 10*3/uL (ref 0.0–0.5)
Eosinophils Relative: 0 %
HCT: 35.4 % — ABNORMAL LOW (ref 36.0–46.0)
Hemoglobin: 11.5 g/dL — ABNORMAL LOW (ref 12.0–15.0)
Immature Granulocytes: 10 %
Lymphocytes Relative: 16 %
Lymphs Abs: 1.7 10*3/uL (ref 0.7–4.0)
MCH: 30.2 pg (ref 26.0–34.0)
MCHC: 32.5 g/dL (ref 30.0–36.0)
MCV: 92.9 fL (ref 80.0–100.0)
Monocytes Absolute: 0.7 10*3/uL (ref 0.1–1.0)
Monocytes Relative: 6 %
Neutro Abs: 7.4 10*3/uL (ref 1.7–7.7)
Neutrophils Relative %: 67 %
Platelet Count: 211 10*3/uL (ref 150–400)
RBC: 3.81 MIL/uL — ABNORMAL LOW (ref 3.87–5.11)
RDW: 13.8 % (ref 11.5–15.5)
WBC Count: 11 10*3/uL — ABNORMAL HIGH (ref 4.0–10.5)
nRBC: 0.2 % (ref 0.0–0.2)

## 2019-01-28 LAB — RESEARCH LABS

## 2019-01-28 LAB — CMP (CANCER CENTER ONLY)
ALT: 14 U/L (ref 0–44)
AST: 14 U/L — ABNORMAL LOW (ref 15–41)
Albumin: 3.9 g/dL (ref 3.5–5.0)
Alkaline Phosphatase: 108 U/L (ref 38–126)
Anion gap: 9 (ref 5–15)
BUN: 12 mg/dL (ref 6–20)
CO2: 27 mmol/L (ref 22–32)
Calcium: 9.1 mg/dL (ref 8.9–10.3)
Chloride: 107 mmol/L (ref 98–111)
Creatinine: 0.83 mg/dL (ref 0.44–1.00)
GFR, Est AFR Am: >60 mL/min (ref >60)
GFR, Estimated: >60 mL/min (ref >60)
Glucose, Bld: 93 mg/dL (ref 70–99)
Potassium: 4.1 mmol/L (ref 3.5–5.1)
Sodium: 143 mmol/L (ref 135–145)
Total Bilirubin: <0.2 mg/dL — ABNORMAL LOW (ref 0.3–1.2)
Total Protein: 7 g/dL (ref 6.5–8.1)

## 2019-01-28 MED ORDER — PALONOSETRON HCL INJECTION 0.25 MG/5ML
0.2500 mg | Freq: Once | INTRAVENOUS | Status: AC
  Administered 2019-01-28: 0.25 mg via INTRAVENOUS

## 2019-01-28 MED ORDER — SODIUM CHLORIDE 0.9% FLUSH
10.0000 mL | INTRAVENOUS | Status: DC | PRN
  Administered 2019-01-28: 10 mL
  Filled 2019-01-28: qty 10

## 2019-01-28 MED ORDER — DOXORUBICIN HCL CHEMO IV INJECTION 2 MG/ML
50.0000 mg/m2 | Freq: Once | INTRAVENOUS | Status: AC
  Administered 2019-01-28: 86 mg via INTRAVENOUS
  Filled 2019-01-28: qty 43

## 2019-01-28 MED ORDER — SODIUM CHLORIDE 0.9 % IV SOLN
Freq: Once | INTRAVENOUS | Status: AC
  Filled 2019-01-28: qty 250

## 2019-01-28 MED ORDER — PALONOSETRON HCL INJECTION 0.25 MG/5ML
INTRAVENOUS | Status: AC
  Filled 2019-01-28: qty 5

## 2019-01-28 MED ORDER — SODIUM CHLORIDE 0.9 % IV SOLN
Freq: Once | INTRAVENOUS | Status: AC
  Filled 2019-01-28: qty 5

## 2019-01-28 MED ORDER — HEPARIN SOD (PORK) LOCK FLUSH 100 UNIT/ML IV SOLN
500.0000 [IU] | Freq: Once | INTRAVENOUS | Status: AC | PRN
  Administered 2019-01-28: 500 [IU]
  Filled 2019-01-28: qty 5

## 2019-01-28 MED ORDER — SODIUM CHLORIDE 0.9 % IV SOLN
500.0000 mg/m2 | Freq: Once | INTRAVENOUS | Status: AC
  Administered 2019-01-28: 860 mg via INTRAVENOUS
  Filled 2019-01-28: qty 43

## 2019-01-28 MED ORDER — ONDANSETRON 8 MG PO TBDP: 8.0000 mg | ORAL_TABLET | Freq: Three times a day (TID) | ORAL | 3 refills | Status: DC | PRN

## 2019-01-28 MED FILL — ONDANSETRON ODT 8 MG TABLET: 8 | 6 days supply | Qty: 20 | Fill #0

## 2019-01-28 NOTE — Telephone Encounter (Signed)
Patient returned my phone call. Answered patients questions about [facility] Medicaid and explained it is full Medicaid. Patient has not received her ID card. Gave patient Medicaid ID number. Told patient if she has specific questions about what Medicaid covers that she will need to call DSS. Patient verbalized understanding.

## 2019-01-28 NOTE — Assessment & Plan Note (Signed)
12/12/2018:Patient palpated right breast mass. Mammogram showed a 3.3cm solid and cystic right breast mass, 6 o'clock position, no abnormal right axillary lymph nodes. 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Neoadjuvant chemotherapy with dose dense Adriamycin and Cytoxan x4 followed by Taxol weekly x12 2.Breast conserving surgery with sentinel lymph node biopsy 3.Adjuvant radiation Upbeat clinical trial: No adverse effects due to participation in the study ----------------------------------------------------------------------------------------------------------------------------------------------------- Current treatment: Cycle 3 day1dose dense Adriamycin and Cytoxan Echocardiogram: 12/22/2018: EF 60 to 65%  Chemo toxicities: 1.Nausea: Controlled with antiemetics 2.Acid reflux: on Protonix: Resolved Patient is gaining weight because of increased appetite.  She will watch what she eats and will increase her fruits and vegetables in her diet.  Return to clinic in 2 weeks for cycle 4 

## 2019-01-28 NOTE — Patient Instructions (Signed)
[facility] Discharge Instructions for Patients Receiving Chemotherapy  Today you received the following chemotherapy agents :  Adriamycin,  Cytoxan.  To help prevent nausea and vomiting after your treatment, we encourage you to take your nausea medication as prescribed.  CAN ALSO TAKE ONDANSETRON-ODT  8 MG BY MOUTH EVERY 8 HOURS AS NEEDED FOR NAUSEA.   If you develop nausea and vomiting that is not controlled by your nausea medication, call the clinic.   BELOW ARE SYMPTOMS THAT SHOULD BE REPORTED IMMEDIATELY:  *FEVER GREATER THAN 100.5 F  *CHILLS WITH OR WITHOUT FEVER  NAUSEA AND VOMITING THAT IS NOT CONTROLLED WITH YOUR NAUSEA MEDICATION  *UNUSUAL SHORTNESS OF BREATH  *UNUSUAL BRUISING OR BLEEDING  TENDERNESS IN MOUTH AND THROAT WITH OR WITHOUT PRESENCE OF ULCERS  *URINARY PROBLEMS  *BOWEL PROBLEMS  UNUSUAL RASH Items with * indicate a potential emergency and should be followed up as soon as possible.  Feel free to call the clinic should you have any questions or concerns. The clinic phone number is [phone].  Please show the [place] [facility] at check-in to the Emergency Department and triage nurse.

## 2019-01-28 NOTE — Progress Notes (Signed)
[facility] CSW Progress Notes  Call to patient in order to follow up on tasks from last visit.  She continues to be concerned about her Medicaid coverage. Has not received a card yet.  Per chart, [facility] has tried to contact patient but VM was full.  Gave patient number to contact her directly.  Patient has not reviewed information on outside financial assistance - states she has been feelilng quite sick since last treatment and needs to focus available energy on trying to work her current job. CSW encouraged her to review these applications when she can - noted that some forms of assistance are only available while in active treatment.  No further needs, will complete visit at this time.  Please reconsult if needs arise.  Edwyna Shell, LCSW Clinical Social Worker Phone:  [phone] Cell:  [phone]

## 2019-01-28 NOTE — Research (Signed)
UPBEAT: 1 month Labs Patient had her 1 month labs for study drawn today.  CRF28-Cardiovascular Event Form completed today: patient with no cardiovascular events present.  Patient thanked for her time and continued support of study and was encouraged to call with any questions or concerns she may have.  [name], RN, BSN, [facility] Clinical Research 01/28/2019 3:53 PM

## 2019-01-29 ENCOUNTER — Other Ambulatory Visit: Payer: Self-pay | Admitting: *Deleted

## 2019-01-29 MED FILL — CYTOMEL 5 MCG TABLET: 5 | 30 days supply | Qty: 30 | Fill #0

## 2019-01-29 MED FILL — buPROPion HCL ER (XL) 150 M: 150 | 90 days supply | Qty: 90 | Fill #0

## 2019-01-29 MED FILL — LIDOCAINE-PRILOCAINE CREAM: 2.5-2.5 | 10 days supply | Qty: 30 | Fill #0

## 2019-01-29 MED FILL — SYNTHROID 75 MCG TABLET: 75 | 90 days supply | Qty: 90 | Fill #0

## 2019-01-29 MED FILL — PANTOPRAZOLE SOD DR 40 MG T: 40 | 30 days supply | Qty: 30 | Fill #0

## 2019-01-30 ENCOUNTER — Inpatient Hospital Stay: Payer: Medicaid Other

## 2019-01-30 VITALS — BP 127/90 | HR 96 | Temp 98.3°F | Resp 20

## 2019-01-30 DIAGNOSIS — C50311 Malignant neoplasm of lower-inner quadrant of right female breast: Secondary | ICD-10-CM

## 2019-01-30 DIAGNOSIS — Z171 Estrogen receptor negative status [ER-]: Secondary | ICD-10-CM

## 2019-01-30 DIAGNOSIS — Z5111 Encounter for antineoplastic chemotherapy: Secondary | ICD-10-CM | POA: Diagnosis not present

## 2019-01-30 MED ORDER — PEGFILGRASTIM-CBQV 6 MG/0.6ML ~~LOC~~ SOSY
6.0000 mg | PREFILLED_SYRINGE | Freq: Once | SUBCUTANEOUS | Status: AC
  Administered 2019-01-30: 6 mg via SUBCUTANEOUS

## 2019-01-30 NOTE — Patient Instructions (Signed)

## 2019-02-02 ENCOUNTER — Other Ambulatory Visit: Payer: Self-pay

## 2019-02-02 DIAGNOSIS — C50311 Malignant neoplasm of lower-inner quadrant of right female breast: Secondary | ICD-10-CM

## 2019-02-02 DIAGNOSIS — Z171 Estrogen receptor negative status [ER-]: Secondary | ICD-10-CM

## 2019-02-02 MED ORDER — DEXAMETHASONE 4 MG PO TABS: ORAL_TABLET | ORAL | 0 refills | Status: DC

## 2019-02-02 MED ORDER — PROCHLORPERAZINE MALEATE 10 MG PO TABS: 10.0000 mg | ORAL_TABLET | Freq: Four times a day (QID) | ORAL | 1 refills | Status: DC | PRN

## 2019-02-02 MED ORDER — LORAZEPAM 0.5 MG PO TABS: 0.5000 mg | ORAL_TABLET | Freq: Every evening | ORAL | 0 refills | Status: DC | PRN

## 2019-02-02 MED FILL — DEXAMETHASONE 4 MG TABLET: 4 | 8 days supply | Qty: 8 | Fill #0

## 2019-02-02 MED FILL — PROCHLORPERAZINE 10 MG TAB: 10 | 7 days supply | Qty: 30 | Fill #0

## 2019-02-03 MED FILL — LORazepam 0.5 MG TABS: 0.5 | 30 days supply | Qty: 30 | Fill #0

## 2019-02-11 ENCOUNTER — Other Ambulatory Visit: Payer: Self-pay

## 2019-02-11 ENCOUNTER — Inpatient Hospital Stay: Payer: Medicaid Other

## 2019-02-11 ENCOUNTER — Inpatient Hospital Stay: Payer: Medicaid Other | Attending: Hematology and Oncology

## 2019-02-11 VITALS — BP 129/85 | HR 90 | Temp 98.3°F | Resp 18

## 2019-02-11 DIAGNOSIS — D6481 Anemia due to antineoplastic chemotherapy: Secondary | ICD-10-CM | POA: Insufficient documentation

## 2019-02-11 DIAGNOSIS — C50311 Malignant neoplasm of lower-inner quadrant of right female breast: Secondary | ICD-10-CM

## 2019-02-11 DIAGNOSIS — Z5111 Encounter for antineoplastic chemotherapy: Secondary | ICD-10-CM | POA: Diagnosis not present

## 2019-02-11 DIAGNOSIS — Z79899 Other long term (current) drug therapy: Secondary | ICD-10-CM | POA: Insufficient documentation

## 2019-02-11 DIAGNOSIS — T451X5A Adverse effect of antineoplastic and immunosuppressive drugs, initial encounter: Secondary | ICD-10-CM | POA: Diagnosis not present

## 2019-02-11 DIAGNOSIS — Z171 Estrogen receptor negative status [ER-]: Secondary | ICD-10-CM

## 2019-02-11 DIAGNOSIS — Z7952 Long term (current) use of systemic steroids: Secondary | ICD-10-CM | POA: Diagnosis not present

## 2019-02-11 DIAGNOSIS — Z7689 Persons encountering health services in other specified circumstances: Secondary | ICD-10-CM | POA: Diagnosis not present

## 2019-02-11 DIAGNOSIS — L658 Other specified nonscarring hair loss: Secondary | ICD-10-CM | POA: Insufficient documentation

## 2019-02-11 DIAGNOSIS — R112 Nausea with vomiting, unspecified: Secondary | ICD-10-CM | POA: Diagnosis not present

## 2019-02-11 DIAGNOSIS — Z95828 Presence of other vascular implants and grafts: Secondary | ICD-10-CM

## 2019-02-11 LAB — CBC WITH DIFFERENTIAL (CANCER CENTER ONLY)
Abs Immature Granulocytes: 0.31 10*3/uL — ABNORMAL HIGH (ref 0.00–0.07)
Basophils Absolute: 0.1 10*3/uL (ref 0.0–0.1)
Basophils Relative: 1 %
Eosinophils Absolute: 0 10*3/uL (ref 0.0–0.5)
Eosinophils Relative: 0 %
HCT: 33.2 % — ABNORMAL LOW (ref 36.0–46.0)
Hemoglobin: 10.9 g/dL — ABNORMAL LOW (ref 12.0–15.0)
Immature Granulocytes: 4 %
Lymphocytes Relative: 19 %
Lymphs Abs: 1.5 10*3/uL (ref 0.7–4.0)
MCH: 30.4 pg (ref 26.0–34.0)
MCHC: 32.8 g/dL (ref 30.0–36.0)
MCV: 92.5 fL (ref 80.0–100.0)
Monocytes Absolute: 0.6 10*3/uL (ref 0.1–1.0)
Monocytes Relative: 7 %
Neutro Abs: 5.6 10*3/uL (ref 1.7–7.7)
Neutrophils Relative %: 69 %
Platelet Count: 310 10*3/uL (ref 150–400)
RBC: 3.59 MIL/uL — ABNORMAL LOW (ref 3.87–5.11)
RDW: 15.9 % — ABNORMAL HIGH (ref 11.5–15.5)
WBC Count: 8.1 10*3/uL (ref 4.0–10.5)
nRBC: 0 % (ref 0.0–0.2)

## 2019-02-11 LAB — CMP (CANCER CENTER ONLY)
ALT: 12 U/L (ref 0–44)
AST: 10 U/L — ABNORMAL LOW (ref 15–41)
Albumin: 4 g/dL (ref 3.5–5.0)
Alkaline Phosphatase: 106 U/L (ref 38–126)
Anion gap: 7 (ref 5–15)
BUN: 12 mg/dL (ref 6–20)
CO2: 27 mmol/L (ref 22–32)
Calcium: 8.8 mg/dL — ABNORMAL LOW (ref 8.9–10.3)
Chloride: 108 mmol/L (ref 98–111)
Creatinine: 0.81 mg/dL (ref 0.44–1.00)
GFR, Est AFR Am: >60 mL/min (ref >60)
GFR, Estimated: >60 mL/min (ref >60)
Glucose, Bld: 95 mg/dL (ref 70–99)
Potassium: 4 mmol/L (ref 3.5–5.1)
Sodium: 142 mmol/L (ref 135–145)
Total Bilirubin: <0.2 mg/dL — ABNORMAL LOW (ref 0.3–1.2)
Total Protein: 6.9 g/dL (ref 6.5–8.1)

## 2019-02-11 MED ORDER — PALONOSETRON HCL INJECTION 0.25 MG/5ML
INTRAVENOUS | Status: AC
  Filled 2019-02-11: qty 5

## 2019-02-11 MED ORDER — HEPARIN SOD (PORK) LOCK FLUSH 100 UNIT/ML IV SOLN
500.0000 [IU] | Freq: Once | INTRAVENOUS | Status: AC | PRN
  Administered 2019-02-11: 16:00:00 500 [IU]
  Filled 2019-02-11: qty 5

## 2019-02-11 MED ORDER — SODIUM CHLORIDE 0.9 % IV SOLN
INTRAVENOUS | Status: DC
  Filled 2019-02-11: qty 250

## 2019-02-11 MED ORDER — SODIUM CHLORIDE 0.9 % IV SOLN
500.0000 mg/m2 | Freq: Once | INTRAVENOUS | Status: AC
  Administered 2019-02-11: 15:00:00 860 mg via INTRAVENOUS
  Filled 2019-02-11: qty 43

## 2019-02-11 MED ORDER — SODIUM CHLORIDE 0.9 % IV SOLN
Freq: Once | INTRAVENOUS | Status: AC
  Filled 2019-02-11: qty 5

## 2019-02-11 MED ORDER — SODIUM CHLORIDE 0.9% FLUSH
10.0000 mL | INTRAVENOUS | Status: DC | PRN
  Administered 2019-02-11: 10 mL
  Filled 2019-02-11: qty 10

## 2019-02-11 MED ORDER — DOXORUBICIN HCL CHEMO IV INJECTION 2 MG/ML
50.0000 mg/m2 | Freq: Once | INTRAVENOUS | Status: AC
  Administered 2019-02-11: 86 mg via INTRAVENOUS
  Filled 2019-02-11: qty 43

## 2019-02-11 MED ORDER — PALONOSETRON HCL INJECTION 0.25 MG/5ML
0.2500 mg | Freq: Once | INTRAVENOUS | Status: AC
  Administered 2019-02-11: 0.25 mg via INTRAVENOUS

## 2019-02-11 MED ORDER — SODIUM CHLORIDE 0.9% FLUSH
10.0000 mL | INTRAVENOUS | Status: DC | PRN
  Administered 2019-02-11: 13:00:00 10 mL via INTRAVENOUS
  Filled 2019-02-11: qty 10

## 2019-02-11 MED ORDER — SODIUM CHLORIDE 0.9 % IV SOLN
Freq: Once | INTRAVENOUS | Status: AC
  Filled 2019-02-11: qty 250

## 2019-02-11 NOTE — Patient Instructions (Signed)
[facility] Discharge Instructions for Patients Receiving Chemotherapy  Today you received the following chemotherapy agents :  Adriamycin,  Cytoxan.  To help prevent nausea and vomiting after your treatment, we encourage you to take your nausea medication as prescribed.  CAN ALSO TAKE ONDANSETRON-ODT  8 MG BY MOUTH EVERY 8 HOURS AS NEEDED FOR NAUSEA.   If you develop nausea and vomiting that is not controlled by your nausea medication, call the clinic.   BELOW ARE SYMPTOMS THAT SHOULD BE REPORTED IMMEDIATELY:  *FEVER GREATER THAN 100.5 F  *CHILLS WITH OR WITHOUT FEVER  NAUSEA AND VOMITING THAT IS NOT CONTROLLED WITH YOUR NAUSEA MEDICATION  *UNUSUAL SHORTNESS OF BREATH  *UNUSUAL BRUISING OR BLEEDING  TENDERNESS IN MOUTH AND THROAT WITH OR WITHOUT PRESENCE OF ULCERS  *URINARY PROBLEMS  *BOWEL PROBLEMS  UNUSUAL RASH Items with * indicate a potential emergency and should be followed up as soon as possible.  Feel free to call the clinic should you have any questions or concerns. The clinic phone number is [phone].  Please show the [facility] at check-in to the Emergency Department and triage nurse.

## 2019-02-11 NOTE — Patient Instructions (Signed)

## 2019-02-11 NOTE — Progress Notes (Signed)
Orders updated

## 2019-02-13 ENCOUNTER — Inpatient Hospital Stay: Payer: Medicaid Other

## 2019-02-13 ENCOUNTER — Other Ambulatory Visit: Payer: Self-pay

## 2019-02-13 VITALS — BP 128/82 | HR 78 | Temp 98.2°F | Resp 18

## 2019-02-13 DIAGNOSIS — Z5111 Encounter for antineoplastic chemotherapy: Secondary | ICD-10-CM | POA: Diagnosis not present

## 2019-02-13 DIAGNOSIS — C50311 Malignant neoplasm of lower-inner quadrant of right female breast: Secondary | ICD-10-CM

## 2019-02-13 DIAGNOSIS — Z171 Estrogen receptor negative status [ER-]: Secondary | ICD-10-CM

## 2019-02-13 MED ORDER — PEGFILGRASTIM-CBQV 6 MG/0.6ML ~~LOC~~ SOSY
PREFILLED_SYRINGE | SUBCUTANEOUS | Status: AC
  Filled 2019-02-13: qty 0.6

## 2019-02-13 MED ORDER — PEGFILGRASTIM-CBQV 6 MG/0.6ML ~~LOC~~ SOSY
6.0000 mg | PREFILLED_SYRINGE | Freq: Once | SUBCUTANEOUS | Status: AC
  Administered 2019-02-13: 10:00:00 6 mg via SUBCUTANEOUS

## 2019-02-13 NOTE — Patient Instructions (Signed)

## 2019-02-16 ENCOUNTER — Telehealth: Payer: Self-pay | Admitting: Hematology and Oncology

## 2019-02-16 NOTE — Telephone Encounter (Signed)
Returned patient's phone call regarding rescheduling an appointment, left a voicemail. 

## 2019-02-17 ENCOUNTER — Encounter: Payer: Self-pay | Admitting: *Deleted

## 2019-02-20 MED FILL — PROCHLORPERAZINE 10 MG TAB: 10 | 7 days supply | Qty: 30 | Fill #1

## 2019-02-24 ENCOUNTER — Encounter: Payer: Self-pay | Admitting: *Deleted

## 2019-02-25 ENCOUNTER — Ambulatory Visit: Payer: Medicaid Other

## 2019-02-25 ENCOUNTER — Other Ambulatory Visit: Payer: Medicaid Other

## 2019-02-25 ENCOUNTER — Ambulatory Visit: Payer: Medicaid Other | Admitting: Hematology and Oncology

## 2019-02-25 NOTE — Progress Notes (Signed)
Patient Care Team: [name], MD as PCP - General (Family Medicine) [name], RN as Oncology Nurse Navigator [name], RN as Oncology Nurse Navigator [name], MD as Consulting Physician (General Surgery) [name], MD as Consulting Physician (Hematology and Oncology) [name], MD as Consulting Physician (Radiation Oncology) [name], RN as Registered Nurse (Medical Oncology)  DIAGNOSIS:    ICD-10-CM   1. Malignant neoplasm of lower-inner quadrant of right breast of female, estrogen receptor negative ([place])  C50.311    Z17.1     SUMMARY OF ONCOLOGIC HISTORY: Oncology History  Malignant neoplasm of lower-inner quadrant of right breast of female, estrogen receptor negative ([place])  12/12/2018 Initial Diagnosis   Patient palpated right breast mass. Mammogram showed a 3.3cm solid and cystic right breast mass, 6 o'clock position, no abnormal right axillary lymph nodes. 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    12/17/2018 Cancer Staging   Staging form: Breast, AJCC 8th Edition - Clinical stage from 12/17/2018: Stage IIB (cT2, cN0, cM0, G3, ER-, PR-, HER2-) - Signed by [name], MD on 12/17/2018   12/23/2018 Genetic Testing   Negative genetic testing:  No pathogenic variants detected on the [facility] Multi-Cancer panel. A variant of uncertain significance was detected in the BARD1 gene called [id number]. The report date is 12/23/2018.  The Multi-Cancer Panel offered by [facility] includes sequencing and/or deletion duplication testing of the following 85 genes: AIP, ALK, APC, ATM, AXIN2,BAP1,  BARD1, BLM, BMPR1A, BRCA1, BRCA2, BRIP1, CASR, CDC73, CDH1, CDK4, CDKN1B, CDKN1C, CDKN2A (p14ARF), CDKN2A (p16INK4a), CEBPA, CHEK2, CTNNA1, DICER1, DIS3L2, EGFR (c.2369C>T, p.Thr790Met variant only), EPCAM (Deletion/duplication testing only), FH, FLCN, GATA2, GPC3, GREM1 (Promoter region  deletion/duplication testing only), HOXB13 (c.251G>A, p.Gly84Glu), HRAS, KIT, MAX, MEN1, MET, MITF (c.952G>A, p.Glu318Lys variant only), MLH1, MSH2, MSH3, MSH6, MUTYH, NBN, NF1, NF2, NTHL1, PALB2, PDGFRA, PHOX2B, PMS2, POLD1, POLE, POT1, PRKAR1A, PTCH1, PTEN, RAD50, RAD51C, RAD51D, RB1, RECQL4, RET, RNF43, RUNX1, SDHAF2, SDHA (sequence changes only), SDHB, SDHC, SDHD, SMAD4, SMARCA4, SMARCB1, SMARCE1, STK11, SUFU, TERC, TERT, TMEM127, TP53, TSC1, TSC2, VHL, WRN and WT1.     12/30/2018 -  Chemotherapy   The patient had DOXOrubicin (ADRIAMYCIN) chemo injection 102 mg, 60 mg/m2 = 102 mg, Intravenous,  Once, 4 of 4 cycles Dose modification: 50 mg/m2 (original dose 60 mg/m2, Cycle 3, Reason: Dose not tolerated) Administration: 102 mg (12/30/2018), 102 mg (01/15/2019), 86 mg (01/28/2019), 86 mg (02/11/2019) palonosetron (ALOXI) injection 0.25 mg, 0.25 mg, Intravenous,  Once, 4 of 8 cycles Administration: 0.25 mg (12/30/2018), 0.25 mg (01/15/2019), 0.25 mg (01/28/2019), 0.25 mg (02/11/2019) pegfilgrastim-cbqv (UDENYCA) injection 6 mg, 6 mg, Subcutaneous, Once, 4 of 4 cycles Administration: 6 mg (01/01/2019), 6 mg (01/17/2019), 6 mg (01/30/2019), 6 mg (02/13/2019) CARBOplatin (PARAPLATIN) 500.4 mg in sodium chloride 0.9 % 250 mL chemo infusion, 620 mg (124.1 % of original dose 500.4 mg), Intravenous,  Once, 0 of 4 cycles Dose modification:   (original dose 500.4 mg, Cycle 5) cyclophosphamide (CYTOXAN) 1,020 mg in sodium chloride 0.9 % 250 mL chemo infusion, 600 mg/m2 = 1,020 mg, Intravenous,  Once, 4 of 4 cycles Dose modification: 500 mg/m2 (original dose 600 mg/m2, Cycle 3, Reason: Dose not tolerated) Administration: 1,020 mg (12/30/2018), 1,020 mg (01/15/2019), 860 mg (01/28/2019), 860 mg (02/11/2019) PACLitaxel (TAXOL) 138 mg in sodium chloride 0.9 % 250 mL chemo infusion (</= 65m/m2), 80 mg/m2 = 138 mg, Intravenous,  Once, 0 of 4 cycles fosaprepitant (EMEND) 150 mg, dexamethasone (DECADRON)  12 mg in sodium  chloride 0.9 % 145 mL IVPB, , Intravenous,  Once, 4 of 8 cycles Administration:  (12/30/2018),  (01/15/2019),  (01/28/2019),  (02/11/2019)  for chemotherapy treatment.      CHIEF COMPLIANT: Cycle1 Taxol with carboplatin  INTERVAL HISTORY: Kara Pittman is a 54 y.o. with above-mentioned history of  triple negative right breast cancer. She is currently on neoadjuvant chemotherapy with weekly Taxol after completing 4 cycles of dose dense Adriamycin and Cytoxan. She is a participant in the UpBeat clinical trial.She presents to the clinic todayforcycle1 Taxol and Carboplatin.    ALLERGIES:  has No Known Allergies.  MEDICATIONS:  Current Outpatient Medications  Medication Sig Dispense Refill  . acetaminophen (TYLENOL) 325 MG tablet Take by mouth.    Marland Kitchen b complex vitamins capsule Take by mouth.    Marland Kitchen buPROPion (WELLBUTRIN XL) 150 MG 24 hr tablet Take 1 tablet by mouth daily 90 tablet 3  . CYTOMEL 5 MCG tablet Take 1 tablet by mouth once daily 90 tablet 3  . dexamethasone (DECADRON) 4 MG tablet Take 1 tablet day after chemo and 1 tablet 2 days after chemo with food 8 tablet 0  . lidocaine-prilocaine (EMLA) cream Apply to affected area once 30 g 3  . LORazepam (ATIVAN) 0.5 MG tablet Take 1 tablet (0.5 mg total) by mouth at bedtime as needed for sleep. 30 tablet 0  . Multiple Vitamin (MULTIVITAMIN) capsule Take by mouth.    . ondansetron (ZOFRAN-ODT) 8 MG disintegrating tablet Take 1 tablet (8 mg total) by mouth every 8 (eight) hours as needed for nausea or vomiting. 20 tablet 3  . pantoprazole (PROTONIX) 40 MG tablet Take 1 tablet (40 mg total) by mouth daily. 30 tablet 3  . prochlorperazine (COMPAZINE) 10 MG tablet Take 1 tablet (10 mg total) by mouth every 6 (six) hours as needed (Nausea or vomiting). 30 tablet 1  . promethazine (PHENERGAN) 25 MG suppository Place 1 suppository (25 mg total) rectally every 6 (six) hours as needed for nausea or vomiting. 12 each 1  . SYNTHROID 75 MCG tablet Take  1 tablet by mouth once daily 90 tablet 3   No current facility-administered medications for this visit.   Facility-Administered Medications Ordered in Other Visits  Medication Dose Route Frequency Provider Last Rate Last Admin  . sodium chloride flush (NS) 0.9 % injection 10 mL  10 mL Intravenous PRN [name], MD   10 mL at 02/26/19 1209    PHYSICAL EXAMINATION: ECOG PERFORMANCE STATUS: 1 - Symptomatic but completely ambulatory  There were no vitals filed for this visit. There were no vitals filed for this visit.  LABORATORY DATA:  I have reviewed the data as listed CMP Latest Ref Rng & Units 02/11/2019 01/28/2019 01/15/2019  Glucose 70 - 99 mg/dL 95 93 99  BUN 6 - 20 mg/dL _0 Creatinine 0.44 - 1.00 mg/dL 0.81 0.83 0.81  Sodium 135 - 145 mmol/L 142 143 144  Potassium 3.5 - 5.1 mmol/L 4.0 4.1 4.2  Chloride 98 - 111 mmol/L 108 107 109  CO2 22 - 32 mmol/L _1 Calcium 8.9 - 10.3 mg/dL 8.8(L) 9.1 9.2  Total Protein 6.5 - 8.1 g/dL 6.9 7.0 7.2  Total Bilirubin 0.3 - 1.2 mg/dL <0.2(L) <0.2(L) <0.2(L)  Alkaline Phos 38 - 126 U/L 106 108 112  AST 15 - 41 U/L 10(L) 14(L) 15  ALT 0 - 44 U/L _2 Lab Results  Component  Value Date   WBC 8.1 02/11/2019   HGB 10.9 (L) 02/11/2019   HCT 33.2 (L) 02/11/2019   MCV 92.5 02/11/2019   PLT 310 02/11/2019   NEUTROABS 5.6 02/11/2019    ASSESSMENT & PLAN:  Malignant neoplasm of lower-inner quadrant of right breast of female, estrogen receptor negative ([place]) 12/12/2018:Patient palpated right breast mass. Mammogram showed a 3.3cm solid and cystic right breast mass, 6 o'clock position, no abnormal right axillary lymph nodes. 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Neoadjuvant chemotherapy with dose dense Adriamycin and Cytoxan x4 followed by Taxol weekly x12 2.Breast conserving surgery with sentinel lymph node  biopsy 3.Adjuvant radiation Upbeat clinical trial: No adverse effects due to participation in the study ----------------------------------------------------------------------------------------------------------------------------------------------------- Current treatment: Completed 4 cycles of dose dense Adriamycin and Cytoxan, today is cycle 1 Taxol Carboplatin Echocardiogram: 12/22/2018: EF 60 to 65%  Chemo toxicities: 1.Severe nausea and vomiting: Dose reduced, much better 2.Acid reflux:onProtonix: Resolved 3.  Alopecia 4. Chemo induced anemia: Hb better Return to clinic in1weeksfor cycle 2 Taxol    No orders of the defined types were placed in this encounter.  The patient has a good understanding of the overall plan. she agrees with it. she will call with any problems that may develop before the next visit here.  Total time spent: 30 mins including face to face time and time spent for planning, charting and coordination of care  [name], MD 02/26/2019  I, Cloyde Reams Dorshimer, am acting as scribe for Dr. [name].  I have reviewed the above documentation for accuracy and completeness, and I agree with the above.

## 2019-02-26 ENCOUNTER — Inpatient Hospital Stay (HOSPITAL_BASED_OUTPATIENT_CLINIC_OR_DEPARTMENT_OTHER): Payer: Medicaid Other | Admitting: Hematology and Oncology

## 2019-02-26 ENCOUNTER — Inpatient Hospital Stay: Payer: Medicaid Other

## 2019-02-26 ENCOUNTER — Telehealth: Payer: Self-pay | Admitting: *Deleted

## 2019-02-26 ENCOUNTER — Other Ambulatory Visit: Payer: Self-pay

## 2019-02-26 VITALS — BP 115/79 | HR 96 | Temp 98.5°F | Resp 18

## 2019-02-26 DIAGNOSIS — C50311 Malignant neoplasm of lower-inner quadrant of right female breast: Secondary | ICD-10-CM

## 2019-02-26 DIAGNOSIS — Z171 Estrogen receptor negative status [ER-]: Secondary | ICD-10-CM | POA: Diagnosis not present

## 2019-02-26 DIAGNOSIS — Z95828 Presence of other vascular implants and grafts: Secondary | ICD-10-CM

## 2019-02-26 DIAGNOSIS — Z5111 Encounter for antineoplastic chemotherapy: Secondary | ICD-10-CM | POA: Diagnosis not present

## 2019-02-26 LAB — CBC WITH DIFFERENTIAL (CANCER CENTER ONLY)
Abs Immature Granulocytes: 0.15 10*3/uL — ABNORMAL HIGH (ref 0.00–0.07)
Basophils Absolute: 0.1 10*3/uL (ref 0.0–0.1)
Basophils Relative: 1 %
Eosinophils Absolute: 0.1 10*3/uL (ref 0.0–0.5)
Eosinophils Relative: 1 %
HCT: 35.6 % — ABNORMAL LOW (ref 36.0–46.0)
Hemoglobin: 11.7 g/dL — ABNORMAL LOW (ref 12.0–15.0)
Immature Granulocytes: 2 %
Lymphocytes Relative: 20 %
Lymphs Abs: 1.5 10*3/uL (ref 0.7–4.0)
MCH: 31.6 pg (ref 26.0–34.0)
MCHC: 32.9 g/dL (ref 30.0–36.0)
MCV: 96.2 fL (ref 80.0–100.0)
Monocytes Absolute: 0.6 10*3/uL (ref 0.1–1.0)
Monocytes Relative: 8 %
Neutro Abs: 5 10*3/uL (ref 1.7–7.7)
Neutrophils Relative %: 68 %
Platelet Count: 288 10*3/uL (ref 150–400)
RBC: 3.7 MIL/uL — ABNORMAL LOW (ref 3.87–5.11)
RDW: 17.7 % — ABNORMAL HIGH (ref 11.5–15.5)
WBC Count: 7.3 10*3/uL (ref 4.0–10.5)
nRBC: 0 % (ref 0.0–0.2)

## 2019-02-26 LAB — CMP (CANCER CENTER ONLY)
ALT: 13 U/L (ref 0–44)
AST: 13 U/L — ABNORMAL LOW (ref 15–41)
Albumin: 4 g/dL (ref 3.5–5.0)
Alkaline Phosphatase: 113 U/L (ref 38–126)
Anion gap: 8 (ref 5–15)
BUN: 20 mg/dL (ref 6–20)
CO2: 27 mmol/L (ref 22–32)
Calcium: 9.5 mg/dL (ref 8.9–10.3)
Chloride: 109 mmol/L (ref 98–111)
Creatinine: 0.84 mg/dL (ref 0.44–1.00)
GFR, Est AFR Am: >60 mL/min (ref >60)
GFR, Estimated: >60 mL/min (ref >60)
Glucose, Bld: 86 mg/dL (ref 70–99)
Potassium: 4.1 mmol/L (ref 3.5–5.1)
Sodium: 144 mmol/L (ref 135–145)
Total Bilirubin: <0.2 mg/dL — ABNORMAL LOW (ref 0.3–1.2)
Total Protein: 7 g/dL (ref 6.5–8.1)

## 2019-02-26 MED ORDER — SODIUM CHLORIDE 0.9 % IV SOLN
80.0000 mg/m2 | Freq: Once | INTRAVENOUS | Status: AC
  Administered 2019-02-26: 15:00:00 138 mg via INTRAVENOUS
  Filled 2019-02-26: qty 23

## 2019-02-26 MED ORDER — FAMOTIDINE IN NACL 20-0.9 MG/50ML-% IV SOLN
INTRAVENOUS | Status: AC
  Filled 2019-02-26: qty 50

## 2019-02-26 MED ORDER — SODIUM CHLORIDE 0.9% FLUSH
10.0000 mL | INTRAVENOUS | Status: DC | PRN
  Administered 2019-02-26: 17:00:00 10 mL
  Filled 2019-02-26: qty 10

## 2019-02-26 MED ORDER — FAMOTIDINE IN NACL 20-0.9 MG/50ML-% IV SOLN
20.0000 mg | Freq: Once | INTRAVENOUS | Status: AC
  Administered 2019-02-26: 14:00:00 20 mg via INTRAVENOUS

## 2019-02-26 MED ORDER — PALONOSETRON HCL INJECTION 0.25 MG/5ML
0.2500 mg | Freq: Once | INTRAVENOUS | Status: AC
  Administered 2019-02-26: 14:00:00 0.25 mg via INTRAVENOUS

## 2019-02-26 MED ORDER — SODIUM CHLORIDE 0.9 % IV SOLN
604.2000 mg | Freq: Once | INTRAVENOUS | Status: AC
  Administered 2019-02-26: 17:00:00 600 mg via INTRAVENOUS
  Filled 2019-02-26: qty 60

## 2019-02-26 MED ORDER — SODIUM CHLORIDE 0.9 % IV SOLN
Freq: Once | INTRAVENOUS | Status: AC
  Filled 2019-02-26: qty 250

## 2019-02-26 MED ORDER — PALONOSETRON HCL INJECTION 0.25 MG/5ML
INTRAVENOUS | Status: AC
  Filled 2019-02-26: qty 5

## 2019-02-26 MED ORDER — HEPARIN SOD (PORK) LOCK FLUSH 100 UNIT/ML IV SOLN
500.0000 [IU] | Freq: Once | INTRAVENOUS | Status: AC | PRN
  Administered 2019-02-26: 17:00:00 500 [IU]
  Filled 2019-02-26: qty 5

## 2019-02-26 MED ORDER — SODIUM CHLORIDE 0.9 % IV SOLN
Freq: Once | INTRAVENOUS | Status: AC
  Filled 2019-02-26: qty 5

## 2019-02-26 MED ORDER — SODIUM CHLORIDE 0.9% FLUSH
10.0000 mL | INTRAVENOUS | Status: DC | PRN
  Administered 2019-02-26: 10 mL via INTRAVENOUS
  Filled 2019-02-26: qty 10

## 2019-02-26 MED ORDER — DIPHENHYDRAMINE HCL 50 MG/ML IJ SOLN
INTRAMUSCULAR | Status: AC
  Filled 2019-02-26: qty 1

## 2019-02-26 MED ORDER — DIPHENHYDRAMINE HCL 50 MG/ML IJ SOLN
25.0000 mg | Freq: Once | INTRAMUSCULAR | Status: AC
  Administered 2019-02-26: 14:00:00 25 mg via INTRAVENOUS

## 2019-02-26 NOTE — Assessment & Plan Note (Signed)
12/12/2018:Patient palpated right breast mass. Mammogram showed a 3.3cm solid and cystic right breast mass, 6 o'clock position, no abnormal right axillary lymph nodes. 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Neoadjuvant chemotherapy with dose dense Adriamycin and Cytoxan x4 followed by Taxol weekly x12 2.Breast conserving surgery with sentinel lymph node biopsy 3.Adjuvant radiation Upbeat clinical trial: No adverse effects due to participation in the study ----------------------------------------------------------------------------------------------------------------------------------------------------- Current treatment: Cycle4day1dose dense Adriamycin and Cytoxan Echocardiogram: 12/22/2018: EF 60 to 65%  Chemo toxicities: 1.Severe nausea and vomiting: Dose reduced 2.Acid reflux:onProtonix: Resolved 3.  Alopecia  Return to clinic in2weeksfor cycle 1 Taxol

## 2019-02-26 NOTE — Telephone Encounter (Signed)
Received after hours message from pt requesting to verify apt time for today.  Attempt x1 to contact pt, no answer, LVM to verify apt starting at 11am today.

## 2019-02-26 NOTE — Patient Instructions (Signed)
[facility] Discharge Instructions for Patients Receiving Chemotherapy  Today you received the following chemotherapy agents paclitaxel and carboplatin  To help prevent nausea and vomiting after your treatment, we encourage you to take your nausea medication as prescribed.   If you develop nausea and vomiting that is not controlled by your nausea medication, call the clinic.   BELOW ARE SYMPTOMS THAT SHOULD BE REPORTED IMMEDIATELY:  *FEVER GREATER THAN 100.5 F  *CHILLS WITH OR WITHOUT FEVER  NAUSEA AND VOMITING THAT IS NOT CONTROLLED WITH YOUR NAUSEA MEDICATION  *UNUSUAL SHORTNESS OF BREATH  *UNUSUAL BRUISING OR BLEEDING  TENDERNESS IN MOUTH AND THROAT WITH OR WITHOUT PRESENCE OF ULCERS  *URINARY PROBLEMS  *BOWEL PROBLEMS  UNUSUAL RASH Items with * indicate a potential emergency and should be followed up as soon as possible.  Feel free to call the clinic should you have any questions or concerns. The clinic phone number is [phone].  Please show the [facility] at check-in to the Emergency Department and triage nurse.  Paclitaxel injection What is this medicine? PACLITAXEL (PAK li TAX el) is a chemotherapy drug. It targets fast dividing cells, like cancer cells, and causes these cells to die. This medicine is used to treat ovarian cancer, breast cancer, lung cancer, Kaposi's sarcoma, and other cancers. This medicine may be used for other purposes; ask your health care provider or pharmacist if you have questions. COMMON BRAND NAME(S): Onxol, Taxol What should I tell my health care provider before I take this medicine? They need to know if you have any of these conditions:  history of irregular heartbeat  liver disease  low blood counts, like low white cell, platelet, or red cell counts  lung or breathing disease, like asthma  tingling of the fingers or toes, or other nerve disorder  an unusual or allergic reaction to paclitaxel, alcohol,  polyoxyethylated castor oil, other chemotherapy, other medicines, foods, dyes, or preservatives  pregnant or trying to get pregnant  breast-feeding How should I use this medicine? This drug is given as an infusion into a vein. It is administered in a hospital or clinic by a specially trained health care professional. Talk to your pediatrician regarding the use of this medicine in children. Special care may be needed. Overdosage: If you think you have taken too much of this medicine contact a poison control center or emergency room at once. NOTE: This medicine is only for you. Do not share this medicine with others. What if I miss a dose? It is important not to miss your dose. Call your doctor or health care professional if you are unable to keep an appointment. What may interact with this medicine? Do not take this medicine with any of the following medications:  disulfiram  metronidazole This medicine may also interact with the following medications:  antiviral medicines for hepatitis, HIV or AIDS  certain antibiotics like erythromycin and clarithromycin  certain medicines for fungal infections like ketoconazole and itraconazole  certain medicines for seizures like carbamazepine, phenobarbital, phenytoin  gemfibrozil  nefazodone  rifampin  St. John's wort This list may not describe all possible interactions. Give your health care provider a list of all the medicines, herbs, non-prescription drugs, or dietary supplements you use. Also tell them if you smoke, drink alcohol, or use illegal drugs. Some items may interact with your medicine. What should I watch for while using this medicine? Your condition will be monitored carefully while you are receiving this medicine. You will need important  blood work done while you are taking this medicine. This medicine can cause serious allergic reactions. To reduce your risk you will need to take other medicine(s) before treatment with this  medicine. If you experience allergic reactions like skin rash, itching or hives, swelling of the face, lips, or tongue, tell your doctor or health care professional right away. In some cases, you may be given additional medicines to help with side effects. Follow all directions for their use. This drug may make you feel generally unwell. This is not uncommon, as chemotherapy can affect healthy cells as well as cancer cells. Report any side effects. Continue your course of treatment even though you feel ill unless your doctor tells you to stop. Call your doctor or health care professional for advice if you get a fever, chills or sore throat, or other symptoms of a cold or flu. Do not treat yourself. This drug decreases your body's ability to fight infections. Try to avoid being around people who are sick. This medicine may increase your risk to bruise or bleed. Call your doctor or health care professional if you notice any unusual bleeding. Be careful brushing and flossing your teeth or using a toothpick because you may get an infection or bleed more easily. If you have any dental work done, tell your dentist you are receiving this medicine. Avoid taking products that contain aspirin, acetaminophen, ibuprofen, naproxen, or ketoprofen unless instructed by your doctor. These medicines may hide a fever. Do not become pregnant while taking this medicine. Women should inform their doctor if they wish to become pregnant or think they might be pregnant. There is a potential for serious side effects to an unborn child. Talk to your health care professional or pharmacist for more information. Do not breast-feed an infant while taking this medicine. Men are advised not to father a child while receiving this medicine. This product may contain alcohol. Ask your pharmacist or healthcare provider if this medicine contains alcohol. Be sure to tell all healthcare providers you are taking this medicine. Certain medicines,  like metronidazole and disulfiram, can cause an unpleasant reaction when taken with alcohol. The reaction includes flushing, headache, nausea, vomiting, sweating, and increased thirst. The reaction can last from 30 minutes to several hours. What side effects may I notice from receiving this medicine? Side effects that you should report to your doctor or health care professional as soon as possible:  allergic reactions like skin rash, itching or hives, swelling of the face, lips, or tongue  breathing problems  changes in vision  fast, irregular heartbeat  high or low blood pressure  mouth sores  pain, tingling, numbness in the hands or feet  signs of decreased platelets or bleeding - bruising, pinpoint red spots on the skin, black, tarry stools, blood in the urine  signs of decreased red blood cells - unusually weak or tired, feeling faint or lightheaded, falls  signs of infection - fever or chills, cough, sore throat, pain or difficulty passing urine  signs and symptoms of liver injury like dark yellow or brown urine; general ill feeling or flu-like symptoms; light-colored stools; loss of appetite; nausea; right upper belly pain; unusually weak or tired; yellowing of the eyes or skin  swelling of the ankles, feet, hands  unusually slow heartbeat Side effects that usually do not require medical attention (report to your doctor or health care professional if they continue or are bothersome):  diarrhea  hair loss  loss of appetite  muscle or joint   pain  nausea, vomiting  pain, redness, or irritation at site where injected  tiredness This list may not describe all possible side effects. Call your doctor for medical advice about side effects. You may report side effects to FDA at 1-800-FDA-1088. Where should I keep my medicine? This drug is given in a hospital or clinic and will not be stored at home. NOTE: This sheet is a summary. It may not cover all possible information.  If you have questions about this medicine, talk to your doctor, pharmacist, or health care provider.  2020 Elsevier/Gold Standard (2016-09-25 13:14  Carboplatin injection What is this medicine? CARBOPLATIN (KAR boe pla tin) is a chemotherapy drug. It targets fast dividing cells, like cancer cells, and causes these cells to die. This medicine is used to treat ovarian cancer and many other cancers. This medicine may be used for other purposes; ask your health care provider or pharmacist if you have questions. COMMON BRAND NAME(S): Paraplatin What should I tell my health care provider before I take this medicine? They need to know if you have any of these conditions:  blood disorders  hearing problems  kidney disease  recent or ongoing radiation therapy  an unusual or allergic reaction to carboplatin, cisplatin, other chemotherapy, other medicines, foods, dyes, or preservatives  pregnant or trying to get pregnant  breast-feeding How should I use this medicine? This drug is usually given as an infusion into a vein. It is administered in a hospital or clinic by a specially trained health care professional. Talk to your pediatrician regarding the use of this medicine in children. Special care may be needed. Overdosage: If you think you have taken too much of this medicine contact a poison control center or emergency room at once. NOTE: This medicine is only for you. Do not share this medicine with others. What if I miss a dose? It is important not to miss a dose. Call your doctor or health care professional if you are unable to keep an appointment. What may interact with this medicine?  medicines for seizures  medicines to increase blood counts like filgrastim, pegfilgrastim, sargramostim  some antibiotics like amikacin, gentamicin, neomycin, streptomycin, tobramycin  vaccines Talk to your doctor or health care professional before taking any of these  medicines:  acetaminophen  aspirin  ibuprofen  ketoprofen  naproxen This list may not describe all possible interactions. Give your health care provider a list of all the medicines, herbs, non-prescription drugs, or dietary supplements you use. Also tell them if you smoke, drink alcohol, or use illegal drugs. Some items may interact with your medicine. What should I watch for while using this medicine? Your condition will be monitored carefully while you are receiving this medicine. You will need important blood work done while you are taking this medicine. This drug may make you feel generally unwell. This is not uncommon, as chemotherapy can affect healthy cells as well as cancer cells. Report any side effects. Continue your course of treatment even though you feel ill unless your doctor tells you to stop. In some cases, you may be given additional medicines to help with side effects. Follow all directions for their use. Call your doctor or health care professional for advice if you get a fever, chills or sore throat, or other symptoms of a cold or flu. Do not treat yourself. This drug decreases your body's ability to fight infections. Try to avoid being around people who are sick. This medicine may increase your risk to bruise  or bleed. Call your doctor or health care professional if you notice any unusual bleeding. Be careful brushing and flossing your teeth or using a toothpick because you may get an infection or bleed more easily. If you have any dental work done, tell your dentist you are receiving this medicine. Avoid taking products that contain aspirin, acetaminophen, ibuprofen, naproxen, or ketoprofen unless instructed by your doctor. These medicines may hide a fever. Do not become pregnant while taking this medicine. Women should inform their doctor if they wish to become pregnant or think they might be pregnant. There is a potential for serious side effects to an unborn child. Talk  to your health care professional or pharmacist for more information. Do not breast-feed an infant while taking this medicine. What side effects may I notice from receiving this medicine? Side effects that you should report to your doctor or health care professional as soon as possible:  allergic reactions like skin rash, itching or hives, swelling of the face, lips, or tongue  signs of infection - fever or chills, cough, sore throat, pain or difficulty passing urine  signs of decreased platelets or bleeding - bruising, pinpoint red spots on the skin, black, tarry stools, nosebleeds  signs of decreased red blood cells - unusually weak or tired, fainting spells, lightheadedness  breathing problems  changes in hearing  changes in vision  chest pain  high blood pressure  low blood counts - This drug may decrease the number of white blood cells, red blood cells and platelets. You may be at increased risk for infections and bleeding.  nausea and vomiting  pain, swelling, redness or irritation at the injection site  pain, tingling, numbness in the hands or feet  problems with balance, talking, walking  trouble passing urine or change in the amount of urine Side effects that usually do not require medical attention (report to your doctor or health care professional if they continue or are bothersome):  hair loss  loss of appetite  metallic taste in the mouth or changes in taste This list may not describe all possible side effects. Call your doctor for medical advice about side effects. You may report side effects to FDA at 1-800-FDA-1088. Where should I keep my medicine? This drug is given in a hospital or clinic and will not be stored at home. NOTE: This sheet is a summary. It may not cover all possible information. If you have questions about this medicine, talk to your doctor, pharmacist, or health care provider.  2020 Elsevier/Gold Standard (2007-04-29 14:38:05)

## 2019-02-26 NOTE — Patient Instructions (Signed)

## 2019-03-04 ENCOUNTER — Ambulatory Visit: Payer: Medicaid Other | Admitting: Hematology and Oncology

## 2019-03-04 ENCOUNTER — Ambulatory Visit: Payer: Medicaid Other

## 2019-03-04 ENCOUNTER — Other Ambulatory Visit: Payer: Medicaid Other

## 2019-03-04 ENCOUNTER — Encounter: Payer: Self-pay | Admitting: *Deleted

## 2019-03-04 MED FILL — CYTOMEL 5 MCG TABLET: 5 | 30 days supply | Qty: 30 | Fill #1

## 2019-03-04 NOTE — Progress Notes (Signed)
Patient Care Team: [name], MD as PCP - General (Family Medicine) [name], RN as Oncology Nurse Navigator [name], RN as Oncology Nurse Navigator [name], MD as Consulting Physician (General Surgery) [name], MD as Consulting Physician (Hematology and Oncology) [name], MD as Consulting Physician (Radiation Oncology) [name], RN as Registered Nurse (Medical Oncology)  DIAGNOSIS:    ICD-10-CM   1. Malignant neoplasm of lower-inner quadrant of right breast of female, estrogen receptor negative ([place])  C50.311    Z17.1     SUMMARY OF ONCOLOGIC HISTORY: Oncology History  Malignant neoplasm of lower-inner quadrant of right breast of female, estrogen receptor negative ([place])  12/12/2018 Initial Diagnosis   Patient palpated right breast mass. Mammogram showed a 3.3cm solid and cystic right breast mass, 6 o'clock position, no abnormal right axillary lymph nodes. 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    12/17/2018 Cancer Staging   Staging form: Breast, AJCC 8th Edition - Clinical stage from 12/17/2018: Stage IIB (cT2, cN0, cM0, G3, ER-, PR-, HER2-) - Signed by [name], MD on 12/17/2018   12/23/2018 Genetic Testing   Negative genetic testing:  No pathogenic variants detected on the [facility] Multi-Cancer panel. A variant of uncertain significance was detected in the BARD1 gene called c.26_40dup. The report date is 12/23/2018.  The Multi-Cancer Panel offered by [facility] includes sequencing and/or deletion duplication testing of the following 85 genes: AIP, ALK, APC, ATM, AXIN2,BAP1,  BARD1, BLM, BMPR1A, BRCA1, BRCA2, BRIP1, CASR, CDC73, CDH1, CDK4, CDKN1B, CDKN1C, CDKN2A (p14ARF), CDKN2A (p16INK4a), CEBPA, CHEK2, CTNNA1, DICER1, DIS3L2, EGFR (c.2369C>T, p.Thr790Met variant only), EPCAM (Deletion/duplication testing only), FH, FLCN, GATA2, GPC3, GREM1 (Promoter region  deletion/duplication testing only), HOXB13 (c.251G>A, p.Gly84Glu), HRAS, KIT, MAX, MEN1, MET, MITF (c.952G>A, p.Glu318Lys variant only), MLH1, MSH2, MSH3, MSH6, MUTYH, NBN, NF1, NF2, NTHL1, PALB2, PDGFRA, PHOX2B, PMS2, POLD1, POLE, POT1, PRKAR1A, PTCH1, PTEN, RAD50, RAD51C, RAD51D, RB1, RECQL4, RET, RNF43, RUNX1, SDHAF2, SDHA (sequence changes only), SDHB, SDHC, SDHD, SMAD4, SMARCA4, SMARCB1, SMARCE1, STK11, SUFU, TERC, TERT, TMEM127, TP53, TSC1, TSC2, VHL, WRN and WT1.     12/30/2018 -  Chemotherapy   The patient had DOXOrubicin (ADRIAMYCIN) chemo injection 102 mg, 60 mg/m2 = 102 mg, Intravenous,  Once, 4 of 4 cycles Dose modification: 50 mg/m2 (original dose 60 mg/m2, Cycle 3, Reason: Dose not tolerated) Administration: 102 mg (12/30/2018), 102 mg (01/15/2019), 86 mg (01/28/2019), 86 mg (02/11/2019) palonosetron (ALOXI) injection 0.25 mg, 0.25 mg, Intravenous,  Once, 5 of 8 cycles Administration: 0.25 mg (12/30/2018), 0.25 mg (02/26/2019), 0.25 mg (01/15/2019), 0.25 mg (01/28/2019), 0.25 mg (02/11/2019) pegfilgrastim-cbqv (UDENYCA) injection 6 mg, 6 mg, Subcutaneous, Once, 4 of 4 cycles Administration: 6 mg (01/01/2019), 6 mg (01/17/2019), 6 mg (01/30/2019), 6 mg (02/13/2019) CARBOplatin (PARAPLATIN) 600 mg in sodium chloride 0.9 % 250 mL chemo infusion, 600 mg (120.7 % of original dose 500.4 mg), Intravenous,  Once, 1 of 4 cycles Dose modification:   (original dose 500.4 mg, Cycle 5) Administration: 600 mg (02/26/2019) cyclophosphamide (CYTOXAN) 1,020 mg in sodium chloride 0.9 % 250 mL chemo infusion, 600 mg/m2 = 1,020 mg, Intravenous,  Once, 4 of 4 cycles Dose modification: 500 mg/m2 (original dose 600 mg/m2, Cycle 3, Reason: Dose not tolerated) Administration: 1,020 mg (12/30/2018), 1,020 mg (01/15/2019), 860 mg (01/28/2019), 860 mg (02/11/2019) PACLitaxel (TAXOL) 138 mg in sodium chloride 0.9 % 250 mL chemo infusion (</= 22m/m2), 80 mg/m2 = 138 mg, Intravenous,  Once, 1 of 4  cycles Administration: 138  mg (02/26/2019) fosaprepitant (EMEND) 150 mg, dexamethasone (DECADRON) 12 mg in sodium chloride 0.9 % 145 mL IVPB, , Intravenous,  Once, 5 of 8 cycles Administration:  (12/30/2018),  (02/26/2019),  (01/15/2019),  (01/28/2019),  (02/11/2019)  for chemotherapy treatment.      CHIEF COMPLIANT: Cycle2 Taxol   INTERVAL HISTORY: Kara Pittman is a 54 y.o. 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 She is currently on neoadjuvant chemotherapy with weekly Taxol after completing 4 cycles of dose dense Adriamycin and Cytoxan. She is a participant in the UpBeat clinical trial.She presents to the clinic todayfora toxicity check cycle2 Taxol.  She felt nauseated for a few days after chemo and also fatigue but she was able to walk for an hour on the treadmill.  ALLERGIES:  has No Known Allergies.  MEDICATIONS:  Current Outpatient Medications  Medication Sig Dispense Refill  . acetaminophen (TYLENOL) 325 MG tablet Take by mouth.    Marland Kitchen b complex vitamins capsule Take by mouth.    Marland Kitchen buPROPion (WELLBUTRIN XL) 150 MG 24 hr tablet Take 1 tablet by mouth daily 90 tablet 3  . CYTOMEL 5 MCG tablet Take 1 tablet by mouth once daily 90 tablet 3  . lidocaine-prilocaine (EMLA) cream Apply to affected area once 30 g 3  . LORazepam (ATIVAN) 0.5 MG tablet Take 1 tablet (0.5 mg total) by mouth at bedtime as needed for sleep. 30 tablet 0  . Multiple Vitamin (MULTIVITAMIN) capsule Take by mouth.    . ondansetron (ZOFRAN-ODT) 8 MG disintegrating tablet Take 1 tablet (8 mg total) by mouth every 8 (eight) hours as needed for nausea or vomiting. 20 tablet 3  . pantoprazole (PROTONIX) 40 MG tablet Take 1 tablet (40 mg total) by mouth daily. 30 tablet 3  . prochlorperazine (COMPAZINE) 10 MG tablet Take 1 tablet (10 mg total) by mouth every 6 (six) hours as needed (Nausea or vomiting). 30 tablet 1  . promethazine (PHENERGAN) 25 MG suppository Place 1 suppository (25 mg total) rectally every 6 (six) hours  as needed for nausea or vomiting. 12 each 1  . SYNTHROID 75 MCG tablet Take 1 tablet by mouth once daily 90 tablet 3   No current facility-administered medications for this visit.   Facility-Administered Medications Ordered in Other Visits  Medication Dose Route Frequency Provider Last Rate Last Admin  . sodium chloride flush (NS) 0.9 % injection 10 mL  10 mL Intravenous PRN [name], MD   10 mL at 03/05/19 1143    PHYSICAL EXAMINATION: ECOG PERFORMANCE STATUS: 1 - Symptomatic but completely ambulatory  There were no vitals filed for this visit. There were no vitals filed for this visit.  LABORATORY DATA:  I have reviewed the data as listed CMP Latest Ref Rng & Units 02/26/2019 02/11/2019 01/28/2019  Glucose 70 - 99 mg/dL 86 95 93  BUN 6 - 20 mg/dL _0 Creatinine 0.44 - 1.00 mg/dL 0.84 0.81 0.83  Sodium 135 - 145 mmol/L 144 142 143  Potassium 3.5 - 5.1 mmol/L 4.1 4.0 4.1  Chloride 98 - 111 mmol/L 109 108 107  CO2 22 - 32 mmol/L _1 Calcium 8.9 - 10.3 mg/dL 9.5 8.8(L) 9.1  Total Protein 6.5 - 8.1 g/dL 7.0 6.9 7.0  Total Bilirubin 0.3 - 1.2 mg/dL <0.2(L) <0.2(L) <0.2(L)  Alkaline Phos 38 - 126 U/L 113 106 108  AST 15 - 41 U/L 13(L) 10(L) 14(L)  ALT 0 - 44 U/L 13  12 14    Lab Results  Component Value Date   WBC 4.5 03/05/2019   HGB 10.5 (L) 03/05/2019   HCT 31.9 (L) 03/05/2019   MCV 95.8 03/05/2019   PLT 345 03/05/2019   NEUTROABS 3.0 03/05/2019    ASSESSMENT & PLAN:  Malignant neoplasm of lower-inner quadrant of right breast of female, estrogen receptor negative ([place]) 12/12/2018:Patient palpated right breast mass. Mammogram showed a 3.3cm solid and cystic right breast mass, 6 o'clock position, no abnormal right axillary lymph nodes. 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Neoadjuvant chemotherapy with dose dense Adriamycin and Cytoxan x4 followed by  Taxol weekly x12 2.Breast conserving surgery with sentinel lymph node biopsy 3.Adjuvant radiation Upbeat clinical trial: No adverse effects due to participation in the study ----------------------------------------------------------------------------------------------------------------------------------------------------- Current treatment: Completed 4 cycles of dose dense Adriamycin and Cytoxan, today is cycle 2 Taxol (carboplatin every 3 weeks) Echocardiogram: 12/22/2018: EF 60 to 65%  Chemo toxicities: 1.Nausea: Current antiemetics are working. 2.Acid reflux:onProtonix: Resolved 3.Alopecia 4. Chemo induced anemia: Hb today is 10.5   Return to clinic inweekly for Taxol every other week for follow-up with me.    No orders of the defined types were placed in this encounter.  The patient has a good understanding of the overall plan. she agrees with it. she will call with any problems that may develop before the next visit here.  Total time spent: 30 mins including face to face time and time spent for planning, charting and coordination of care  [name], MD 03/05/2019  I, Cloyde Reams Dorshimer, am acting as scribe for Dr. [name].  I have reviewed the above documentation for accuracy and completeness, and I agree with the above.

## 2019-03-05 ENCOUNTER — Inpatient Hospital Stay: Payer: Medicaid Other

## 2019-03-05 ENCOUNTER — Inpatient Hospital Stay (HOSPITAL_BASED_OUTPATIENT_CLINIC_OR_DEPARTMENT_OTHER): Payer: Medicaid Other | Admitting: Hematology and Oncology

## 2019-03-05 ENCOUNTER — Other Ambulatory Visit: Payer: Self-pay

## 2019-03-05 VITALS — HR 100 | Resp 17

## 2019-03-05 DIAGNOSIS — Z171 Estrogen receptor negative status [ER-]: Secondary | ICD-10-CM | POA: Diagnosis not present

## 2019-03-05 DIAGNOSIS — C50311 Malignant neoplasm of lower-inner quadrant of right female breast: Secondary | ICD-10-CM

## 2019-03-05 DIAGNOSIS — Z5111 Encounter for antineoplastic chemotherapy: Secondary | ICD-10-CM | POA: Diagnosis not present

## 2019-03-05 DIAGNOSIS — Z95828 Presence of other vascular implants and grafts: Secondary | ICD-10-CM

## 2019-03-05 LAB — CBC WITH DIFFERENTIAL (CANCER CENTER ONLY)
Abs Immature Granulocytes: 0.04 10*3/uL (ref 0.00–0.07)
Basophils Absolute: 0.1 10*3/uL (ref 0.0–0.1)
Basophils Relative: 1 %
Eosinophils Absolute: 0 10*3/uL (ref 0.0–0.5)
Eosinophils Relative: 1 %
HCT: 31.9 % — ABNORMAL LOW (ref 36.0–46.0)
Hemoglobin: 10.5 g/dL — ABNORMAL LOW (ref 12.0–15.0)
Immature Granulocytes: 1 %
Lymphocytes Relative: 22 %
Lymphs Abs: 1 10*3/uL (ref 0.7–4.0)
MCH: 31.5 pg (ref 26.0–34.0)
MCHC: 32.9 g/dL (ref 30.0–36.0)
MCV: 95.8 fL (ref 80.0–100.0)
Monocytes Absolute: 0.3 10*3/uL (ref 0.1–1.0)
Monocytes Relative: 8 %
Neutro Abs: 3 10*3/uL (ref 1.7–7.7)
Neutrophils Relative %: 67 %
Platelet Count: 345 10*3/uL (ref 150–400)
RBC: 3.33 MIL/uL — ABNORMAL LOW (ref 3.87–5.11)
RDW: 16.7 % — ABNORMAL HIGH (ref 11.5–15.5)
WBC Count: 4.5 10*3/uL (ref 4.0–10.5)
nRBC: 0 % (ref 0.0–0.2)

## 2019-03-05 LAB — CMP (CANCER CENTER ONLY)
ALT: 22 U/L (ref 0–44)
AST: 17 U/L (ref 15–41)
Albumin: 4 g/dL (ref 3.5–5.0)
Alkaline Phosphatase: 90 U/L (ref 38–126)
Anion gap: 9 (ref 5–15)
BUN: 24 mg/dL — ABNORMAL HIGH (ref 6–20)
CO2: 25 mmol/L (ref 22–32)
Calcium: 8.7 mg/dL — ABNORMAL LOW (ref 8.9–10.3)
Chloride: 107 mmol/L (ref 98–111)
Creatinine: 0.95 mg/dL (ref 0.44–1.00)
GFR, Est AFR Am: >60 mL/min (ref >60)
GFR, Estimated: >60 mL/min (ref >60)
Glucose, Bld: 89 mg/dL (ref 70–99)
Potassium: 4.1 mmol/L (ref 3.5–5.1)
Sodium: 141 mmol/L (ref 135–145)
Total Bilirubin: <0.2 mg/dL — ABNORMAL LOW (ref 0.3–1.2)
Total Protein: 6.7 g/dL (ref 6.5–8.1)

## 2019-03-05 MED ORDER — DIPHENHYDRAMINE HCL 50 MG/ML IJ SOLN
INTRAMUSCULAR | Status: AC
  Filled 2019-03-05: qty 1

## 2019-03-05 MED ORDER — FAMOTIDINE IN NACL 20-0.9 MG/50ML-% IV SOLN
INTRAVENOUS | Status: AC
  Filled 2019-03-05: qty 50

## 2019-03-05 MED ORDER — SODIUM CHLORIDE 0.9% FLUSH
10.0000 mL | INTRAVENOUS | Status: DC | PRN
  Administered 2019-03-05: 16:00:00 10 mL
  Filled 2019-03-05: qty 10

## 2019-03-05 MED ORDER — SODIUM CHLORIDE 0.9% FLUSH
10.0000 mL | INTRAVENOUS | Status: DC | PRN
  Administered 2019-03-05: 12:00:00 10 mL via INTRAVENOUS
  Filled 2019-03-05: qty 10

## 2019-03-05 MED ORDER — ONDANSETRON HCL 4 MG/2ML IJ SOLN
INTRAMUSCULAR | Status: AC
  Filled 2019-03-05: qty 4

## 2019-03-05 MED ORDER — SODIUM CHLORIDE 0.9 % IV SOLN
20.0000 mg | Freq: Once | INTRAVENOUS | Status: AC
  Administered 2019-03-05: 14:00:00 20 mg via INTRAVENOUS
  Filled 2019-03-05: qty 20

## 2019-03-05 MED ORDER — FAMOTIDINE IN NACL 20-0.9 MG/50ML-% IV SOLN
20.0000 mg | Freq: Once | INTRAVENOUS | Status: AC
  Administered 2019-03-05: 14:00:00 20 mg via INTRAVENOUS

## 2019-03-05 MED ORDER — SODIUM CHLORIDE 0.9 % IV SOLN
80.0000 mg/m2 | Freq: Once | INTRAVENOUS | Status: AC
  Administered 2019-03-05: 15:00:00 138 mg via INTRAVENOUS
  Filled 2019-03-05: qty 23

## 2019-03-05 MED ORDER — HEPARIN SOD (PORK) LOCK FLUSH 100 UNIT/ML IV SOLN
500.0000 [IU] | Freq: Once | INTRAVENOUS | Status: AC | PRN
  Administered 2019-03-05: 16:00:00 500 [IU]
  Filled 2019-03-05: qty 5

## 2019-03-05 MED ORDER — DIPHENHYDRAMINE HCL 50 MG/ML IJ SOLN
25.0000 mg | Freq: Once | INTRAMUSCULAR | Status: AC
  Administered 2019-03-05: 13:00:00 25 mg via INTRAVENOUS

## 2019-03-05 MED ORDER — ONDANSETRON HCL 4 MG/2ML IJ SOLN
8.0000 mg | Freq: Once | INTRAMUSCULAR | Status: AC
  Administered 2019-03-05: 14:00:00 8 mg via INTRAVENOUS

## 2019-03-05 MED ORDER — SODIUM CHLORIDE 0.9 % IV SOLN
Freq: Once | INTRAVENOUS | Status: AC
  Filled 2019-03-05: qty 250

## 2019-03-05 NOTE — Progress Notes (Signed)
Per Dr. Lindi Adie: OK to treat with elevated HR

## 2019-03-05 NOTE — Patient Instructions (Signed)
[facility] Discharge Instructions for Patients Receiving Chemotherapy  Today you received the following chemotherapy agents: Paclitaxel (Taxol)  To help prevent nausea and vomiting after your treatment, we encourage you to take your nausea medication as directed.    If you develop nausea and vomiting that is not controlled by your nausea medication, call the clinic.   BELOW ARE SYMPTOMS THAT SHOULD BE REPORTED IMMEDIATELY:  *FEVER GREATER THAN 100.5 F  *CHILLS WITH OR WITHOUT FEVER  NAUSEA AND VOMITING THAT IS NOT CONTROLLED WITH YOUR NAUSEA MEDICATION  *UNUSUAL SHORTNESS OF BREATH  *UNUSUAL BRUISING OR BLEEDING  TENDERNESS IN MOUTH AND THROAT WITH OR WITHOUT PRESENCE OF ULCERS  *URINARY PROBLEMS  *BOWEL PROBLEMS  UNUSUAL RASH Items with * indicate a potential emergency and should be followed up as soon as possible.  Feel free to call the clinic should you have any questions or concerns. The clinic phone number is [phone].  Please show the CHEMO ALERT CARD at check-in to the Emergency Department and triage nurse.  Coronavirus (COVID-19) Are you at risk?  Are you at risk for the Coronavirus (COVID-19)?  To be considered HIGH RISK for Coronavirus (COVID-19), you have to meet the following criteria:  . Traveled to [place], [place], [place], [place] or [place]; or in the [place] to [place], [place], [place], or [place]; and have fever, cough, and shortness of breath within the last 2 weeks of travel OR . Been in close contact with a person diagnosed with COVID-19 within the last 2 weeks and have fever, cough, and shortness of breath . IF YOU DO NOT MEET THESE CRITERIA, YOU ARE CONSIDERED LOW RISK FOR COVID-19.  What to do if you are HIGH RISK for COVID-19?  . If you are having a medical emergency, call 911. . Seek medical care right away. Before you go to a doctor's office, urgent care or emergency department, call ahead and tell them about  your recent travel, contact with someone diagnosed with COVID-19, and your symptoms. You should receive instructions from your physician's office regarding next steps of care.  . When you arrive at healthcare provider, tell the healthcare staff immediately you have returned from visiting [place], [place], [place], [place] or [place]; or traveled in the [place] to [place], [place], [place], or [place]; in the last two weeks or you have been in close contact with a person diagnosed with COVID-19 in the last 2 weeks.   . Tell the health care staff about your symptoms: fever, cough and shortness of breath. . After you have been seen by a medical provider, you will be either: o Tested for (COVID-19) and discharged home on quarantine except to seek medical care if symptoms worsen, and asked to  - Stay home and avoid contact with others until you get your results (4-5 days)  - Avoid travel on public transportation if possible (such as bus, train, or airplane) or o Sent to the Emergency Department by EMS for evaluation, COVID-19 testing, and possible admission depending on your condition and test results.  What to do if you are LOW RISK for COVID-19?  Reduce your risk of any infection by using the same precautions used for avoiding the common cold or flu:  . Wash your hands often with soap and warm water for at least 20 seconds.  If soap and water are not readily available, use an alcohol-based hand sanitizer with at least 60% alcohol.  . If coughing   or sneezing, cover your mouth and nose by coughing or sneezing into the elbow areas of your shirt or coat, into a tissue or into your sleeve (not your hands). . Avoid shaking hands with others and consider head nods or verbal greetings only. . Avoid touching your eyes, nose, or mouth with unwashed hands.  . Avoid close contact with people who are sick. . Avoid places or events with large numbers of people in one location, like concerts or sporting  events. . Carefully consider travel plans you have or are making. . If you are planning any travel outside or inside the [place], visit the CDC's Travelers' Health webpage for the latest health notices. . If you have some symptoms but not all symptoms, continue to monitor at home and seek medical attention if your symptoms worsen. . If you are having a medical emergency, call 911.   ADDITIONAL HEALTHCARE OPTIONS FOR PATIENTS  [facility] / e-Visit: [email or web address]         [facility]: [phone]  [facility]: [phone]                   [facility]: [phone]   

## 2019-03-05 NOTE — Assessment & Plan Note (Signed)
12/12/2018:Patient palpated right breast mass. Mammogram showed a 3.3cm solid and cystic right breast mass, 6 o'clock position, no abnormal right axillary lymph nodes. 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Neoadjuvant chemotherapy with dose dense Adriamycin and Cytoxan x4 followed by Taxol weekly x12 2.Breast conserving surgery with sentinel lymph node biopsy 3.Adjuvant radiation Upbeat clinical trial: No adverse effects due to participation in the study ----------------------------------------------------------------------------------------------------------------------------------------------------- Current treatment: Completed 4 cycles of dose dense Adriamycin and Cytoxan, today is cycle 2 Taxol (carboplatin every 3 weeks) Echocardiogram: 12/22/2018: EF 60 to 65%  Chemo toxicities: 1.Severe nausea and vomiting: Improved with the current regimen 2.Acid reflux:onProtonix: Resolved 3.Alopecia 4. Chemo induced anemia: Hb today is Return to clinic inweekly for Taxol every other week for follow-up with me.

## 2019-03-05 NOTE — Patient Instructions (Signed)

## 2019-03-05 NOTE — Progress Notes (Signed)
Spoke w/ Dr. Lindi Adie, okay to add ondansetron 8mg  IV on days of paclitaxel only treatment for baseline nausea.   Demetrius Charity, PharmD, [facility] Pharmacist Pharmacy Phone: [phone] 03/05/2019

## 2019-03-07 MED FILL — PANTOPRAZOLE SOD DR 40 MG T: 40 | 30 days supply | Qty: 30 | Fill #1

## 2019-03-11 ENCOUNTER — Ambulatory Visit: Payer: Medicaid Other

## 2019-03-11 ENCOUNTER — Other Ambulatory Visit: Payer: Medicaid Other

## 2019-03-11 NOTE — Assessment & Plan Note (Deleted)
12/12/2018:Patient palpated right breast mass. Mammogram showed a 3.3cm solid and cystic right breast mass, 6 o'clock position, no abnormal right axillary lymph nodes. 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Neoadjuvant chemotherapy with dose dense Adriamycin and Cytoxan x4 followed by Taxol weekly x12 2.Breast conserving surgery with sentinel lymph node biopsy 3.Adjuvant radiation Upbeat clinical trial: No adverse effects due to participation in the study ----------------------------------------------------------------------------------------------------------------------------------------------------- Current treatment:Completed 4 cycles ofdose dense Adriamycin and Cytoxan, today is cycle 4 Taxol (carboplatin every 3 weeks) Echocardiogram: 12/22/2018: EF 60 to 65%  Chemo toxicities: 1.Nausea: Current antiemetics are working. 2.Acid reflux:onProtonix: Resolved 3.Alopecia 4. Chemo induced anemia: Hb today is    Return to clinic inweekly for Taxol every other week for follow-up with me.

## 2019-03-12 ENCOUNTER — Other Ambulatory Visit: Payer: Self-pay

## 2019-03-12 ENCOUNTER — Inpatient Hospital Stay: Payer: Medicaid Other | Attending: Hematology and Oncology

## 2019-03-12 ENCOUNTER — Inpatient Hospital Stay: Payer: Medicaid Other

## 2019-03-12 VITALS — BP 128/94 | HR 97 | Temp 98.2°F | Resp 17

## 2019-03-12 DIAGNOSIS — R05 Cough: Secondary | ICD-10-CM | POA: Diagnosis not present

## 2019-03-12 DIAGNOSIS — L658 Other specified nonscarring hair loss: Secondary | ICD-10-CM | POA: Diagnosis not present

## 2019-03-12 DIAGNOSIS — Z171 Estrogen receptor negative status [ER-]: Secondary | ICD-10-CM | POA: Insufficient documentation

## 2019-03-12 DIAGNOSIS — E039 Hypothyroidism, unspecified: Secondary | ICD-10-CM | POA: Diagnosis not present

## 2019-03-12 DIAGNOSIS — K5900 Constipation, unspecified: Secondary | ICD-10-CM | POA: Insufficient documentation

## 2019-03-12 DIAGNOSIS — K219 Gastro-esophageal reflux disease without esophagitis: Secondary | ICD-10-CM | POA: Insufficient documentation

## 2019-03-12 DIAGNOSIS — C50311 Malignant neoplasm of lower-inner quadrant of right female breast: Secondary | ICD-10-CM | POA: Diagnosis present

## 2019-03-12 DIAGNOSIS — Z95828 Presence of other vascular implants and grafts: Secondary | ICD-10-CM

## 2019-03-12 DIAGNOSIS — Z79899 Other long term (current) drug therapy: Secondary | ICD-10-CM | POA: Diagnosis not present

## 2019-03-12 DIAGNOSIS — D6481 Anemia due to antineoplastic chemotherapy: Secondary | ICD-10-CM | POA: Diagnosis not present

## 2019-03-12 DIAGNOSIS — Z806 Family history of leukemia: Secondary | ICD-10-CM | POA: Diagnosis not present

## 2019-03-12 DIAGNOSIS — Z801 Family history of malignant neoplasm of trachea, bronchus and lung: Secondary | ICD-10-CM | POA: Insufficient documentation

## 2019-03-12 DIAGNOSIS — R110 Nausea: Secondary | ICD-10-CM | POA: Insufficient documentation

## 2019-03-12 DIAGNOSIS — Z5111 Encounter for antineoplastic chemotherapy: Secondary | ICD-10-CM | POA: Diagnosis present

## 2019-03-12 DIAGNOSIS — Z808 Family history of malignant neoplasm of other organs or systems: Secondary | ICD-10-CM | POA: Insufficient documentation

## 2019-03-12 DIAGNOSIS — T451X5A Adverse effect of antineoplastic and immunosuppressive drugs, initial encounter: Secondary | ICD-10-CM | POA: Insufficient documentation

## 2019-03-12 LAB — CBC WITH DIFFERENTIAL (CANCER CENTER ONLY)
Abs Immature Granulocytes: 0.02 10*3/uL (ref 0.00–0.07)
Basophils Absolute: 0 10*3/uL (ref 0.0–0.1)
Basophils Relative: 1 %
Eosinophils Absolute: 0 10*3/uL (ref 0.0–0.5)
Eosinophils Relative: 1 %
HCT: 32.7 % — ABNORMAL LOW (ref 36.0–46.0)
Hemoglobin: 10.8 g/dL — ABNORMAL LOW (ref 12.0–15.0)
Immature Granulocytes: 1 %
Lymphocytes Relative: 29 %
Lymphs Abs: 1.1 10*3/uL (ref 0.7–4.0)
MCH: 32.1 pg (ref 26.0–34.0)
MCHC: 33 g/dL (ref 30.0–36.0)
MCV: 97.3 fL (ref 80.0–100.0)
Monocytes Absolute: 0.5 10*3/uL (ref 0.1–1.0)
Monocytes Relative: 12 %
Neutro Abs: 2.2 10*3/uL (ref 1.7–7.7)
Neutrophils Relative %: 56 %
Platelet Count: 224 10*3/uL (ref 150–400)
RBC: 3.36 MIL/uL — ABNORMAL LOW (ref 3.87–5.11)
RDW: 17.4 % — ABNORMAL HIGH (ref 11.5–15.5)
WBC Count: 3.8 10*3/uL — ABNORMAL LOW (ref 4.0–10.5)
nRBC: 0 % (ref 0.0–0.2)

## 2019-03-12 LAB — CMP (CANCER CENTER ONLY)
ALT: 54 U/L — ABNORMAL HIGH (ref 0–44)
AST: 30 U/L (ref 15–41)
Albumin: 4.1 g/dL (ref 3.5–5.0)
Alkaline Phosphatase: 65 U/L (ref 38–126)
Anion gap: 9 (ref 5–15)
BUN: 21 mg/dL — ABNORMAL HIGH (ref 6–20)
CO2: 26 mmol/L (ref 22–32)
Calcium: 9.6 mg/dL (ref 8.9–10.3)
Chloride: 105 mmol/L (ref 98–111)
Creatinine: 0.89 mg/dL (ref 0.44–1.00)
GFR, Est AFR Am: >60 mL/min (ref >60)
GFR, Estimated: >60 mL/min (ref >60)
Glucose, Bld: 99 mg/dL (ref 70–99)
Potassium: 4.3 mmol/L (ref 3.5–5.1)
Sodium: 140 mmol/L (ref 135–145)
Total Bilirubin: 0.3 mg/dL (ref 0.3–1.2)
Total Protein: 6.9 g/dL (ref 6.5–8.1)

## 2019-03-12 MED ORDER — ONDANSETRON HCL 4 MG/2ML IJ SOLN
8.0000 mg | Freq: Once | INTRAMUSCULAR | Status: AC
  Administered 2019-03-12: 8 mg via INTRAVENOUS

## 2019-03-12 MED ORDER — HEPARIN SOD (PORK) LOCK FLUSH 100 UNIT/ML IV SOLN
500.0000 [IU] | Freq: Once | INTRAVENOUS | Status: AC | PRN
  Administered 2019-03-12: 500 [IU]
  Filled 2019-03-12: qty 5

## 2019-03-12 MED ORDER — SODIUM CHLORIDE 0.9 % IV SOLN
80.0000 mg/m2 | Freq: Once | INTRAVENOUS | Status: AC
  Administered 2019-03-12: 138 mg via INTRAVENOUS
  Filled 2019-03-12: qty 23

## 2019-03-12 MED ORDER — SODIUM CHLORIDE 0.9 % IV SOLN
Freq: Once | INTRAVENOUS | Status: AC
  Filled 2019-03-12: qty 250

## 2019-03-12 MED ORDER — SODIUM CHLORIDE 0.9% FLUSH
10.0000 mL | INTRAVENOUS | Status: DC | PRN
  Administered 2019-03-12: 10 mL
  Filled 2019-03-12: qty 10

## 2019-03-12 MED ORDER — DIPHENHYDRAMINE HCL 50 MG/ML IJ SOLN
25.0000 mg | Freq: Once | INTRAMUSCULAR | Status: AC
  Administered 2019-03-12: 25 mg via INTRAVENOUS

## 2019-03-12 MED ORDER — FAMOTIDINE IN NACL 20-0.9 MG/50ML-% IV SOLN
INTRAVENOUS | Status: AC
  Filled 2019-03-12: qty 50

## 2019-03-12 MED ORDER — FAMOTIDINE IN NACL 20-0.9 MG/50ML-% IV SOLN
20.0000 mg | Freq: Once | INTRAVENOUS | Status: AC
  Administered 2019-03-12: 20 mg via INTRAVENOUS

## 2019-03-12 MED ORDER — ONDANSETRON HCL 4 MG/2ML IJ SOLN
INTRAMUSCULAR | Status: AC
  Filled 2019-03-12: qty 4

## 2019-03-12 MED ORDER — SODIUM CHLORIDE 0.9 % IV SOLN
20.0000 mg | Freq: Once | INTRAVENOUS | Status: AC
  Administered 2019-03-12: 20 mg via INTRAVENOUS
  Filled 2019-03-12: qty 2

## 2019-03-12 MED ORDER — DIPHENHYDRAMINE HCL 50 MG/ML IJ SOLN
INTRAMUSCULAR | Status: AC
  Filled 2019-03-12: qty 1

## 2019-03-12 MED ORDER — SODIUM CHLORIDE 0.9% FLUSH
10.0000 mL | INTRAVENOUS | Status: DC | PRN
  Administered 2019-03-12: 10 mL via INTRAVENOUS
  Filled 2019-03-12: qty 10

## 2019-03-12 NOTE — Patient Instructions (Signed)

## 2019-03-12 NOTE — Patient Instructions (Signed)
[facility] Discharge Instructions for Patients Receiving Chemotherapy  Today you received the following chemotherapy agents: Paclitaxel (Taxol)  To help prevent nausea and vomiting after your treatment, we encourage you to take your nausea medication as directed.    If you develop nausea and vomiting that is not controlled by your nausea medication, call the clinic.   BELOW ARE SYMPTOMS THAT SHOULD BE REPORTED IMMEDIATELY:  *FEVER GREATER THAN 100.5 F  *CHILLS WITH OR WITHOUT FEVER  NAUSEA AND VOMITING THAT IS NOT CONTROLLED WITH YOUR NAUSEA MEDICATION  *UNUSUAL SHORTNESS OF BREATH  *UNUSUAL BRUISING OR BLEEDING  TENDERNESS IN MOUTH AND THROAT WITH OR WITHOUT PRESENCE OF ULCERS  *URINARY PROBLEMS  *BOWEL PROBLEMS  UNUSUAL RASH Items with * indicate a potential emergency and should be followed up as soon as possible.  Feel free to call the clinic should you have any questions or concerns. The clinic phone number is [phone].  Please show the CHEMO ALERT CARD at check-in to the Emergency Department and triage nurse.  Coronavirus (COVID-19) Are you at risk?  Are you at risk for the Coronavirus (COVID-19)?  To be considered HIGH RISK for Coronavirus (COVID-19), you have to meet the following criteria:  . Traveled to [place], [place], [place], [place] or [place]; or in the [place] to [place], [place], [place], or [place]; and have fever, cough, and shortness of breath within the last 2 weeks of travel OR . Been in close contact with a person diagnosed with COVID-19 within the last 2 weeks and have fever, cough, and shortness of breath . IF YOU DO NOT MEET THESE CRITERIA, YOU ARE CONSIDERED LOW RISK FOR COVID-19.  What to do if you are HIGH RISK for COVID-19?  . If you are having a medical emergency, call 911. . Seek medical care right away. Before you go to a doctor's office, urgent care or emergency department, call ahead and tell them about  your recent travel, contact with someone diagnosed with COVID-19, and your symptoms. You should receive instructions from your physician's office regarding next steps of care.  . When you arrive at healthcare provider, tell the healthcare staff immediately you have returned from visiting [place], [place], [place], [place] or [place]; or traveled in the [place] to [place], [place], [place], or [place]; in the last two weeks or you have been in close contact with a person diagnosed with COVID-19 in the last 2 weeks.   . Tell the health care staff about your symptoms: fever, cough and shortness of breath. . After you have been seen by a medical provider, you will be either: o Tested for (COVID-19) and discharged home on quarantine except to seek medical care if symptoms worsen, and asked to  - Stay home and avoid contact with others until you get your results (4-5 days)  - Avoid travel on public transportation if possible (such as bus, train, or airplane) or o Sent to the Emergency Department by EMS for evaluation, COVID-19 testing, and possible admission depending on your condition and test results.  What to do if you are LOW RISK for COVID-19?  Reduce your risk of any infection by using the same precautions used for avoiding the common cold or flu:  . Wash your hands often with soap and warm water for at least 20 seconds.  If soap and water are not readily available, use an alcohol-based hand sanitizer with at least 60% alcohol.  . If coughing   or sneezing, cover your mouth and nose by coughing or sneezing into the elbow areas of your shirt or coat, into a tissue or into your sleeve (not your hands). . Avoid shaking hands with others and consider head nods or verbal greetings only. . Avoid touching your eyes, nose, or mouth with unwashed hands.  . Avoid close contact with people who are sick. . Avoid places or events with large numbers of people in one location, like concerts or sporting  events. . Carefully consider travel plans you have or are making. . If you are planning any travel outside or inside the [place], visit the CDC's Travelers' Health webpage for the latest health notices. . If you have some symptoms but not all symptoms, continue to monitor at home and seek medical attention if your symptoms worsen. . If you are having a medical emergency, call 911.   ADDITIONAL HEALTHCARE OPTIONS FOR PATIENTS  [facility] / e-Visit: [email or web address]         [facility]: [phone]  [facility]: [phone]                   [facility]: [phone]   

## 2019-03-13 ENCOUNTER — Encounter: Payer: Self-pay | Admitting: General Practice

## 2019-03-13 NOTE — Progress Notes (Signed)
[facility] Spiritual Care Note  Reached [place] by phone to offer spiritual and emotional support per referral from [facility]. Miquela was very receptive to the call and used the opportunity well to share and process her grief (first anniversary of her son Justin's death is next week, and she also lost a job this year), as well as her joys and gratitude for support from family and friends. She is doing strong reflective, healing work (for example, by creating personal rites of passage to [place] her son). We plan to follow up in person at infusion.   [place], [place], [facility] Pager [phone] Voicemail [phone]

## 2019-03-16 ENCOUNTER — Other Ambulatory Visit: Payer: Self-pay | Admitting: Hematology and Oncology

## 2019-03-16 DIAGNOSIS — C50311 Malignant neoplasm of lower-inner quadrant of right female breast: Secondary | ICD-10-CM

## 2019-03-16 DIAGNOSIS — Z171 Estrogen receptor negative status [ER-]: Secondary | ICD-10-CM

## 2019-03-16 MED FILL — PROCHLORPERAZINE 10 MG TAB: 10 | 7 days supply | Qty: 30 | Fill #0
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
## 2019-03-18 ENCOUNTER — Inpatient Hospital Stay: Payer: Medicaid Other

## 2019-03-18 ENCOUNTER — Inpatient Hospital Stay: Payer: Medicaid Other | Admitting: Hematology and Oncology

## 2019-03-18 ENCOUNTER — Encounter: Payer: Self-pay | Admitting: Nurse Practitioner

## 2019-03-18 ENCOUNTER — Inpatient Hospital Stay (HOSPITAL_BASED_OUTPATIENT_CLINIC_OR_DEPARTMENT_OTHER): Payer: Medicaid Other | Admitting: Nurse Practitioner

## 2019-03-18 ENCOUNTER — Other Ambulatory Visit: Payer: Self-pay

## 2019-03-18 VITALS — BP 132/94 | HR 104 | Temp 98.5°F | Resp 20 | Ht 65.0 in | Wt 156.8 lb

## 2019-03-18 VITALS — HR 93

## 2019-03-18 DIAGNOSIS — C50311 Malignant neoplasm of lower-inner quadrant of right female breast: Secondary | ICD-10-CM | POA: Diagnosis not present

## 2019-03-18 DIAGNOSIS — Z171 Estrogen receptor negative status [ER-]: Secondary | ICD-10-CM

## 2019-03-18 DIAGNOSIS — Z95828 Presence of other vascular implants and grafts: Secondary | ICD-10-CM

## 2019-03-18 DIAGNOSIS — Z5111 Encounter for antineoplastic chemotherapy: Secondary | ICD-10-CM | POA: Diagnosis not present

## 2019-03-18 LAB — CMP (CANCER CENTER ONLY)
ALT: 49 U/L — ABNORMAL HIGH (ref 0–44)
AST: 29 U/L (ref 15–41)
Albumin: 4 g/dL (ref 3.5–5.0)
Alkaline Phosphatase: 73 U/L (ref 38–126)
Anion gap: 10 (ref 5–15)
BUN: 15 mg/dL (ref 6–20)
CO2: 25 mmol/L (ref 22–32)
Calcium: 8.9 mg/dL (ref 8.9–10.3)
Chloride: 109 mmol/L (ref 98–111)
Creatinine: 0.81 mg/dL (ref 0.44–1.00)
GFR, Est AFR Am: >60 mL/min (ref >60)
GFR, Estimated: >60 mL/min (ref >60)
Glucose, Bld: 87 mg/dL (ref 70–99)
Potassium: 4.1 mmol/L (ref 3.5–5.1)
Sodium: 144 mmol/L (ref 135–145)
Total Bilirubin: 0.3 mg/dL (ref 0.3–1.2)
Total Protein: 7.1 g/dL (ref 6.5–8.1)

## 2019-03-18 LAB — CBC WITH DIFFERENTIAL (CANCER CENTER ONLY)
Abs Immature Granulocytes: 0.03 10*3/uL (ref 0.00–0.07)
Basophils Absolute: 0 10*3/uL (ref 0.0–0.1)
Basophils Relative: 1 %
Eosinophils Absolute: 0 10*3/uL (ref 0.0–0.5)
Eosinophils Relative: 1 %
HCT: 32.5 % — ABNORMAL LOW (ref 36.0–46.0)
Hemoglobin: 11.1 g/dL — ABNORMAL LOW (ref 12.0–15.0)
Immature Granulocytes: 1 %
Lymphocytes Relative: 26 %
Lymphs Abs: 1 10*3/uL (ref 0.7–4.0)
MCH: 32.9 pg (ref 26.0–34.0)
MCHC: 34.2 g/dL (ref 30.0–36.0)
MCV: 96.4 fL (ref 80.0–100.0)
Monocytes Absolute: 0.2 10*3/uL (ref 0.1–1.0)
Monocytes Relative: 6 %
Neutro Abs: 2.6 10*3/uL (ref 1.7–7.7)
Neutrophils Relative %: 65 %
Platelet Count: 215 10*3/uL (ref 150–400)
RBC: 3.37 MIL/uL — ABNORMAL LOW (ref 3.87–5.11)
RDW: 17.1 % — ABNORMAL HIGH (ref 11.5–15.5)
WBC Count: 3.9 10*3/uL — ABNORMAL LOW (ref 4.0–10.5)
nRBC: 0 % (ref 0.0–0.2)

## 2019-03-18 MED ORDER — PALONOSETRON HCL INJECTION 0.25 MG/5ML
0.2500 mg | Freq: Once | INTRAVENOUS | Status: AC
  Administered 2019-03-18: 0.25 mg via INTRAVENOUS

## 2019-03-18 MED ORDER — SODIUM CHLORIDE 0.9 % IV SOLN
600.0000 mg | Freq: Once | INTRAVENOUS | Status: AC
  Administered 2019-03-18: 600 mg via INTRAVENOUS
  Filled 2019-03-18: qty 60

## 2019-03-18 MED ORDER — FAMOTIDINE IN NACL 20-0.9 MG/50ML-% IV SOLN
20.0000 mg | Freq: Once | INTRAVENOUS | Status: AC
  Administered 2019-03-18: 20 mg via INTRAVENOUS

## 2019-03-18 MED ORDER — SODIUM CHLORIDE 0.9 % IV SOLN
Freq: Once | INTRAVENOUS | Status: AC
  Filled 2019-03-18: qty 5

## 2019-03-18 MED ORDER — SODIUM CHLORIDE 0.9 % IV SOLN
80.0000 mg/m2 | Freq: Once | INTRAVENOUS | Status: AC
  Administered 2019-03-18: 138 mg via INTRAVENOUS
  Filled 2019-03-18: qty 23

## 2019-03-18 MED ORDER — DEXAMETHASONE SODIUM PHOSPHATE 10 MG/ML IJ SOLN
INTRAMUSCULAR | Status: AC
  Filled 2019-03-18: qty 1

## 2019-03-18 MED ORDER — SODIUM CHLORIDE 0.9 % IV SOLN
Freq: Once | INTRAVENOUS | Status: AC
  Filled 2019-03-18: qty 250

## 2019-03-18 MED ORDER — FAMOTIDINE IN NACL 20-0.9 MG/50ML-% IV SOLN
INTRAVENOUS | Status: AC
  Filled 2019-03-18: qty 50

## 2019-03-18 MED ORDER — DIPHENHYDRAMINE HCL 50 MG/ML IJ SOLN
25.0000 mg | Freq: Once | INTRAMUSCULAR | Status: AC
  Administered 2019-03-18: 25 mg via INTRAVENOUS

## 2019-03-18 MED ORDER — SODIUM CHLORIDE 0.9% FLUSH
10.0000 mL | INTRAVENOUS | Status: DC | PRN
  Administered 2019-03-18: 10 mL
  Filled 2019-03-18: qty 10

## 2019-03-18 MED ORDER — PALONOSETRON HCL INJECTION 0.25 MG/5ML
INTRAVENOUS | Status: AC
  Filled 2019-03-18: qty 5

## 2019-03-18 MED ORDER — SODIUM CHLORIDE 0.9% FLUSH
10.0000 mL | INTRAVENOUS | Status: DC | PRN
  Administered 2019-03-18: 10 mL via INTRAVENOUS
  Filled 2019-03-18: qty 10

## 2019-03-18 MED ORDER — DIPHENHYDRAMINE HCL 50 MG/ML IJ SOLN
INTRAMUSCULAR | Status: AC
  Filled 2019-03-18: qty 1

## 2019-03-18 MED ORDER — ACETAMINOPHEN 325 MG PO TABS
650.0000 mg | ORAL_TABLET | Freq: Once | ORAL | Status: AC
  Administered 2019-03-18: 17:00:00 650 mg via ORAL

## 2019-03-18 MED ORDER — ACETAMINOPHEN 325 MG PO TABS
ORAL_TABLET | ORAL | Status: AC
  Filled 2019-03-18: qty 2

## 2019-03-18 MED ORDER — HEPARIN SOD (PORK) LOCK FLUSH 100 UNIT/ML IV SOLN
500.0000 [IU] | Freq: Once | INTRAVENOUS | Status: AC | PRN
  Administered 2019-03-18: 500 [IU]
  Filled 2019-03-18: qty 5

## 2019-03-18 NOTE — Patient Instructions (Signed)

## 2019-03-18 NOTE — Patient Instructions (Signed)
[place] Discharge Instructions for Patients Receiving Chemotherapy  Today you received the following chemotherapy agents: Paclitaxel (Taxol) and Carboplatin  To help prevent nausea and vomiting after your treatment, we encourage you to take your nausea medication as directed.   If you develop nausea and vomiting that is not controlled by your nausea medication, call the clinic.   BELOW ARE SYMPTOMS THAT SHOULD BE REPORTED IMMEDIATELY:  *FEVER GREATER THAN 100.5 F  *CHILLS WITH OR WITHOUT FEVER  NAUSEA AND VOMITING THAT IS NOT CONTROLLED WITH YOUR NAUSEA MEDICATION  *UNUSUAL SHORTNESS OF BREATH  *UNUSUAL BRUISING OR BLEEDING  TENDERNESS IN MOUTH AND THROAT WITH OR WITHOUT PRESENCE OF ULCERS  *URINARY PROBLEMS  *BOWEL PROBLEMS  UNUSUAL RASH Items with * indicate a potential emergency and should be followed up as soon as possible.  Feel free to call the clinic should you have any questions or concerns. The clinic phone number is [phone].  Please show the [facility] at check-in to the Emergency Department and triage nurse.  Coronavirus (COVID-19) Are you at risk?  Are you at risk for the Coronavirus (COVID-19)?  To be considered HIGH RISK for Coronavirus (COVID-19), you have to meet the following criteria:  . Traveled to [place], [place], [place], [place] or [place]; or in the [place] to [place], [place], [place], or [place]; and have fever, cough, and shortness of breath within the last 2 weeks of travel OR . Been in close contact with a person diagnosed with COVID-19 within the last 2 weeks and have fever, cough, and shortness of breath . IF YOU DO NOT MEET THESE CRITERIA, YOU ARE CONSIDERED LOW RISK FOR COVID-19.  What to do if you are HIGH RISK for COVID-19?  Marland Kitchen If you are having a medical emergency, call 911. . Seek medical care right away. Before you go to a doctor's office, urgent care or emergency department, call ahead and  tell them about your recent travel, contact with someone diagnosed with COVID-19, and your symptoms. You should receive instructions from your physician's office regarding next steps of care.  . When you arrive at healthcare provider, tell the healthcare staff immediately you have returned from visiting [place], [place], [place], [place] or [place]; or traveled in the [place] to [place], [place], [place], or [place]; in the last two weeks or you have been in close contact with a person diagnosed with COVID-19 in the last 2 weeks.   . Tell the health care staff about your symptoms: fever, cough and shortness of breath. . After you have been seen by a medical provider, you will be either: o Tested for (COVID-19) and discharged home on quarantine except to seek medical care if symptoms worsen, and asked to  - Stay home and avoid contact with others until you get your results (4-5 days)  - Avoid travel on public transportation if possible (such as bus, train, or airplane) or o Sent to the Emergency Department by EMS for evaluation, COVID-19 testing, and possible admission depending on your condition and test results.  What to do if you are LOW RISK for COVID-19?  Reduce your risk of any infection by using the same precautions used for avoiding the common cold or flu:  Marland Kitchen Wash your hands often with soap and warm water for at least 20 seconds.  If soap and water are not readily available, use an alcohol-based hand sanitizer with at least 60% alcohol.  . If  coughing or sneezing, cover your mouth and nose by coughing or sneezing into the elbow areas of your shirt or coat, into a tissue or into your sleeve (not your hands). . Avoid shaking hands with others and consider head nods or verbal greetings only. . Avoid touching your eyes, nose, or mouth with unwashed hands.  . Avoid close contact with people who are sick. . Avoid places or events with large numbers of people in one location, like  concerts or sporting events. . Carefully consider travel plans you have or are making. . If you are planning any travel outside or inside the [place], visit the CDC's Travelers' Health webpage for the latest health notices. . If you have some symptoms but not all symptoms, continue to monitor at home and seek medical attention if your symptoms worsen. . If you are having a medical emergency, call 911.   Progress / e-Visit: [email or web address]         [facility]: [facility]: [phone]                   [facility]: [phone]

## 2019-03-19 ENCOUNTER — Telehealth: Payer: Self-pay

## 2019-03-19 NOTE — Telephone Encounter (Signed)
Patients  mammo/US is scheduled for tomorrow 03/20/19 @ 11:am. Left voicemail for patient to let her know appointment date and time. Will try to reach her again later to make sure she knows about appointment

## 2019-03-19 NOTE — Telephone Encounter (Signed)
TC to pt per Cira Rue NP to the breast center [phone] to schedule her mammo/US before next chemo, this is interim imaging to evaluate response to chemo. When I called they stated that they tried to call patient last night to see if she could come this morning but there was answer and they left a message for her to call back. They are currently booked until the 24th but they stated that they would give me a call back before the day is over to see if they could work something out to get it done before the 18th which is her scheduled chemo

## 2019-03-19 NOTE — Telephone Encounter (Signed)
Was able to finally reach patient by phone. She is aware of 11am appointment tomorrow to get mammo/US

## 2019-03-20 ENCOUNTER — Telehealth: Payer: Self-pay | Admitting: Nurse Practitioner

## 2019-03-20 ENCOUNTER — Telehealth: Payer: Self-pay | Admitting: *Deleted

## 2019-03-20 ENCOUNTER — Ambulatory Visit
Admission: RE | Admit: 2019-03-20 | Discharge: 2019-03-20 | Disposition: A | Discharge status: Home / Self Care | Payer: Medicaid Other | Source: Ambulatory Visit | Attending: Nurse Practitioner | Admitting: Nurse Practitioner

## 2019-03-20 ENCOUNTER — Other Ambulatory Visit: Payer: Self-pay

## 2019-03-20 DIAGNOSIS — C50911 Malignant neoplasm of unspecified site of right female breast: Secondary | ICD-10-CM | POA: Diagnosis not present

## 2019-03-20 DIAGNOSIS — Z171 Estrogen receptor negative status [ER-]: Secondary | ICD-10-CM

## 2019-03-20 DIAGNOSIS — C50311 Malignant neoplasm of lower-inner quadrant of right female breast: Secondary | ICD-10-CM

## 2019-03-20 DIAGNOSIS — R922 Inconclusive mammogram: Secondary | ICD-10-CM | POA: Diagnosis not present

## 2019-03-20 HISTORY — DX: Personal history of antineoplastic chemotherapy: Z92.21

## 2019-03-20 IMAGING — MG MM DIGITAL DIAGNOSTIC UNILAT*R* W/ TOMO W/ CAD
4 series · 4 of 12 positions shown · non-contrast
Comparison: Previous exam(s).

CLINICAL DATA: 53-year-old with biopsy-proven invasive breast
cancer with metaplastic features involving the LOWER RIGHT breast
(diagnosed in [DATE]), for which the patient has undergone
neoadjuvant chemotherapy.Patient presents for follow-up to evaluate
response to chemotherapy. She states that she believes the mass has
increased in size recently.

EXAM:
DIGITAL DIAGNOSTIC RIGHT MAMMOGRAM WITH CAD AND TOMO
ULTRASOUND RIGHT BREAST

[R CC synth-2D]
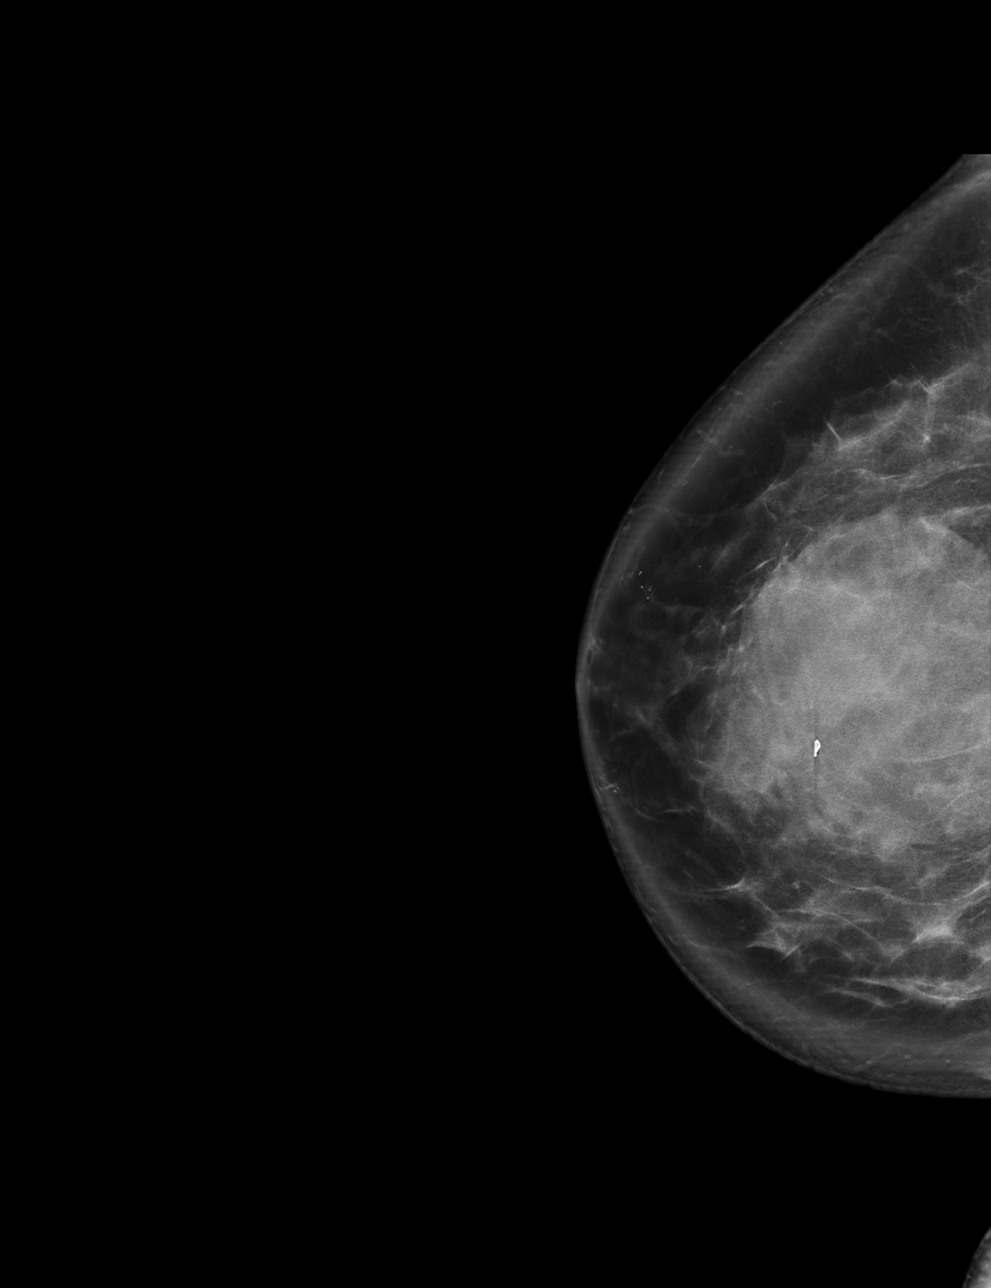

[R MLO synth-2D]
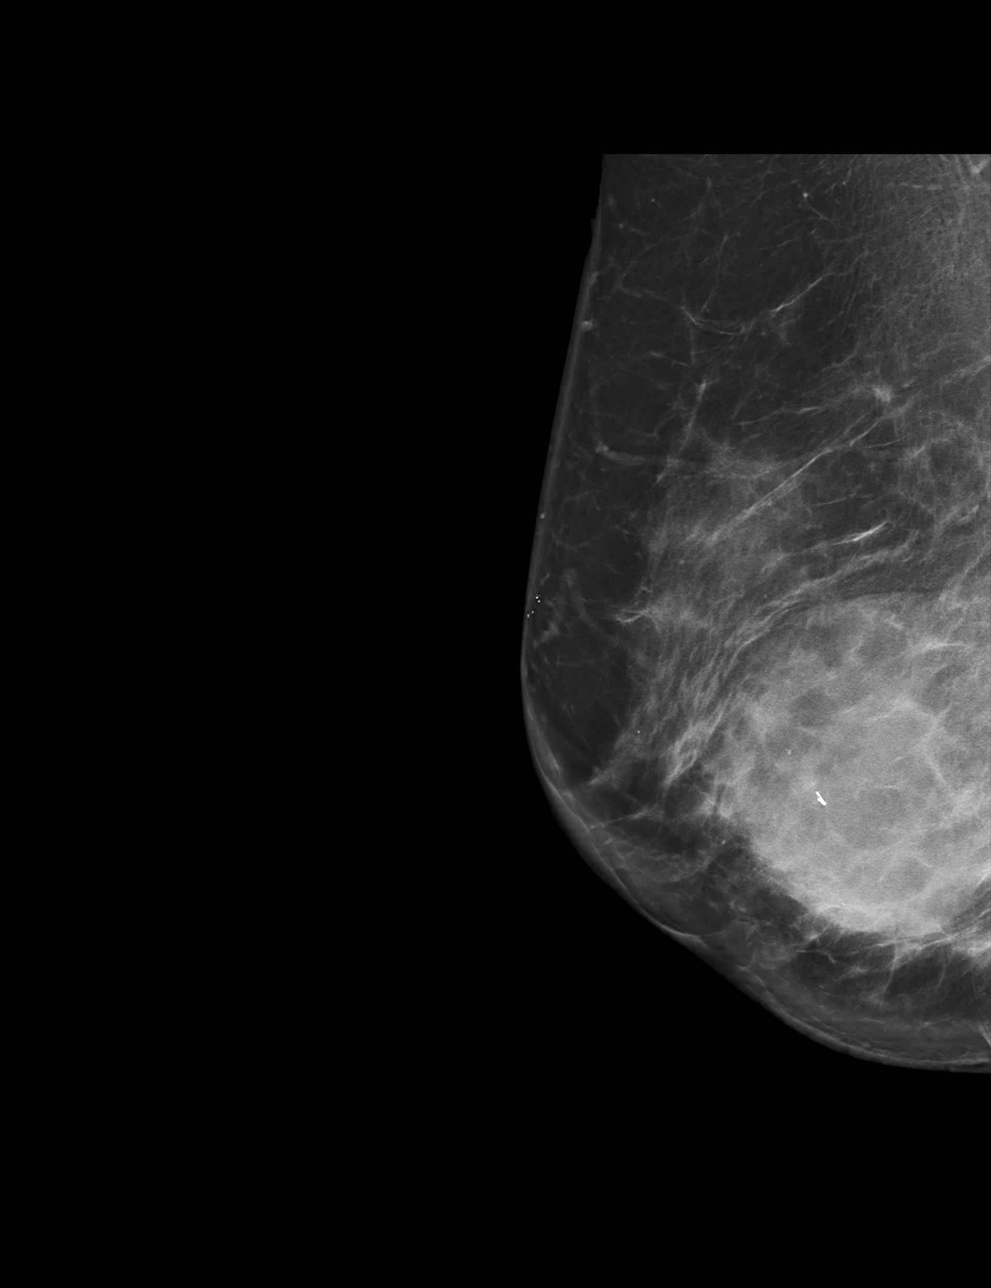

[R MLO tomo · tomo slice 47/92.0]
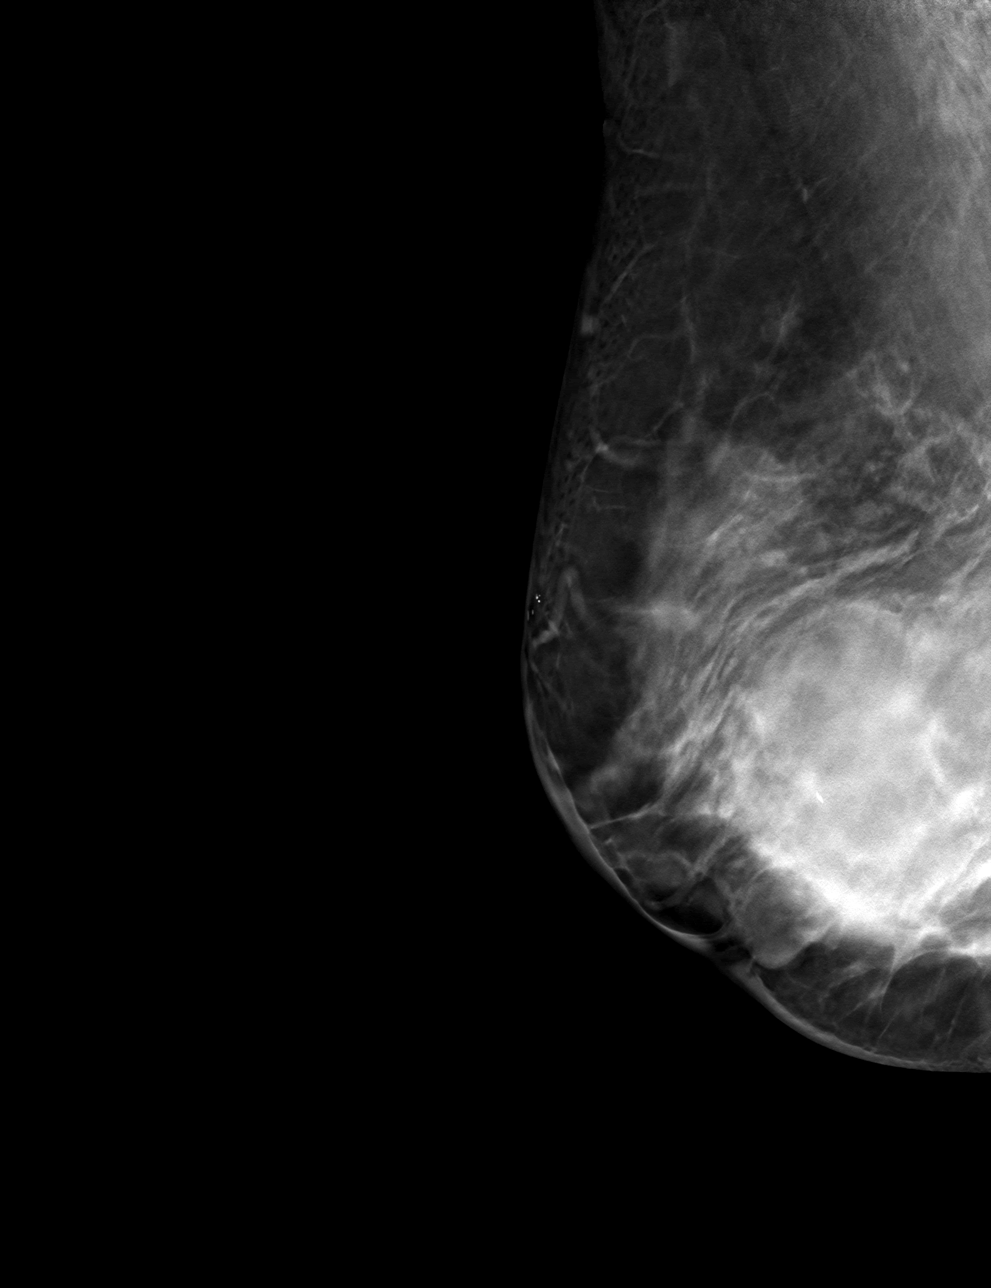

[R CC tomo · tomo slice 47/93.0]
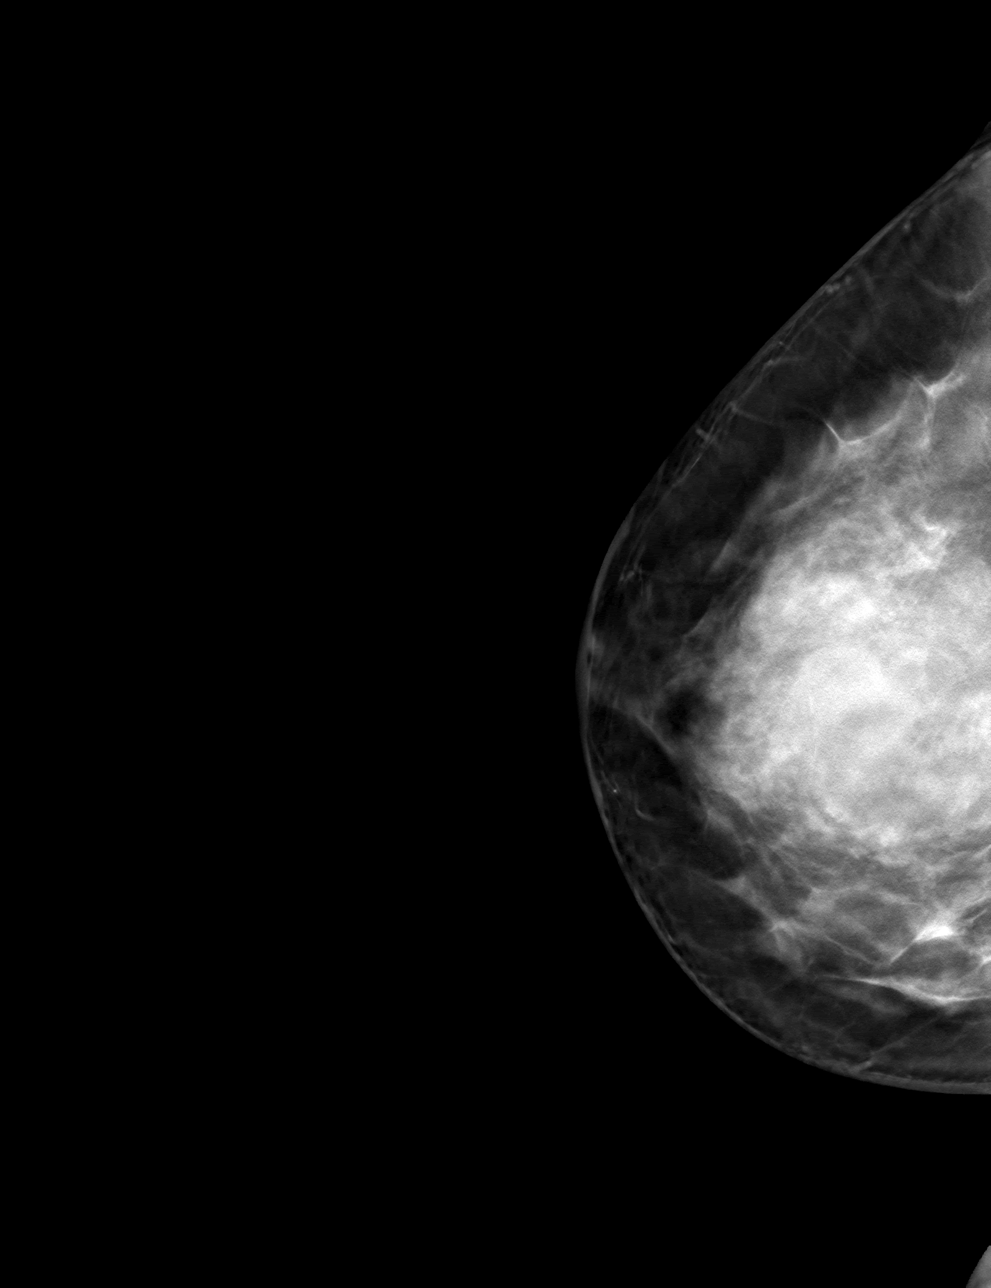

[4 of 12 positions shown; findings below may reference images not displayed]

ACR Breast Density Category c: The breast tissue is heterogeneously
dense, which may obscure small masses.
FINDINGS: Tomosynthesis and synthesized full field CC and MLO views of the
RIGHT breast were obtained.

Since the prior diagnostic mammogram [DATE], the mass in the
LOWER and central RIGHT breast has significantly increased in size,
current measurements approximating 6.5 x 5.5 x 5.4 cm (AP x
transverse x craniocaudal). What is either a second new mass or a
focal lobular component of the large mass is present anteriorly.
Mild skin thickening and skin retraction in the LOWER RIGHT breast
is noted as seen on the prior Breast MRI in [DATE].

Mammographic images were processed with CAD.

On correlative physical exam, there is a visible and palpable firm
approximate 6 cm mass in the RIGHT breast.

Targeted RIGHT breast ultrasound is performed, showing a large
necrotic mass at the 6 o'clock position approximately 1 cm from the
nipple, significantly increased in size since the prior ultrasound
[DATE]. Currently, best estimated measurements of the mass are
approximately 5.6 x 6.0 x 6.0 cm (previously 2.7 x 3.3 x 3.2 cm).
The echogenic tissue marker clip placed at the time of biopsy is
readily apparent. The necrotic mass extends to the skin surface
anteriorly.

Sonographic evaluation of the RIGHT axilla demonstrates no
pathologic lymphadenopathy.
IMPRESSION: 1. Marked interval increase in size of the necrotic mass involving
LOWER and central RIGHT breast, measurements given above.
2. No pathologic RIGHT axillary lymphadenopathy.

RECOMMENDATION:
1. Treatment plan for the biopsy-proven malignancy in the RIGHT
breast.
2. If the patient does not have a prophylactic LEFT mastectomy,
screening mammographic evaluation of the LEFT breast would be due in
late [REDACTED] or [DATE].

I have discussed the findings and recommendations with the patient.
If applicable, a reminder letter will be sent to the patient
regarding the next appointment.

BI-RADS CATEGORY  6: Known biopsy-proven malignancy.

## 2019-03-20 IMAGING — US US BREAST*R* LIMITED INC AXILLA
1 series · 7 of 7 positions shown · non-contrast
Comparison: Previous exam(s).

CLINICAL DATA: 53-year-old with biopsy-proven invasive breast
cancer with metaplastic features involving the LOWER RIGHT breast
(diagnosed in [DATE]), for which the patient has undergone
neoadjuvant chemotherapy.Patient presents for follow-up to evaluate
response to chemotherapy. She states that she believes the mass has
increased in size recently.

EXAM:
DIGITAL DIAGNOSTIC RIGHT MAMMOGRAM WITH CAD AND TOMO
ULTRASOUND RIGHT BREAST

[Series 1: us breast*right* limited inc axilla · 0.09mm/px · 7 of 7 slices shown]
[im 1/7]
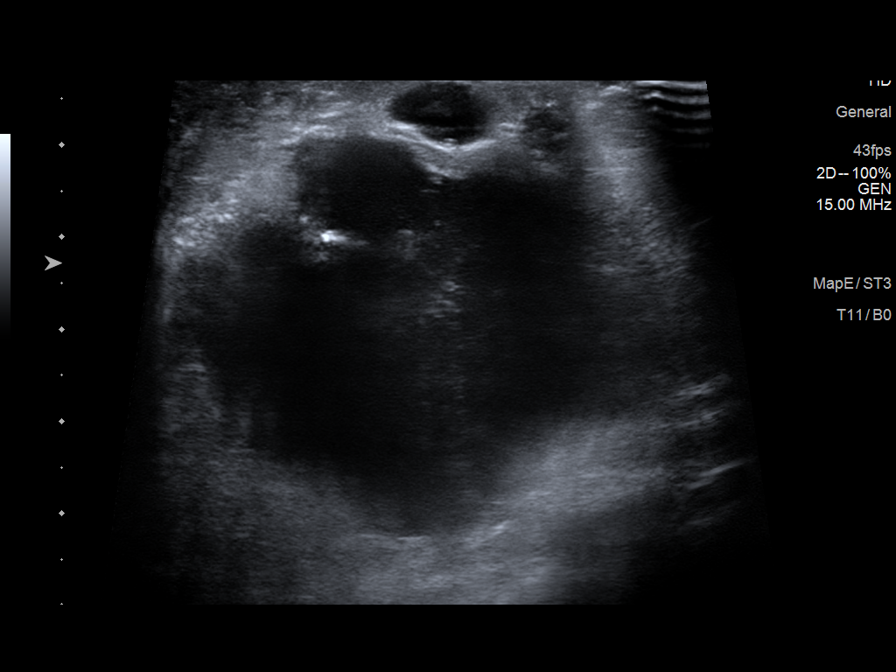
[im 2/7]
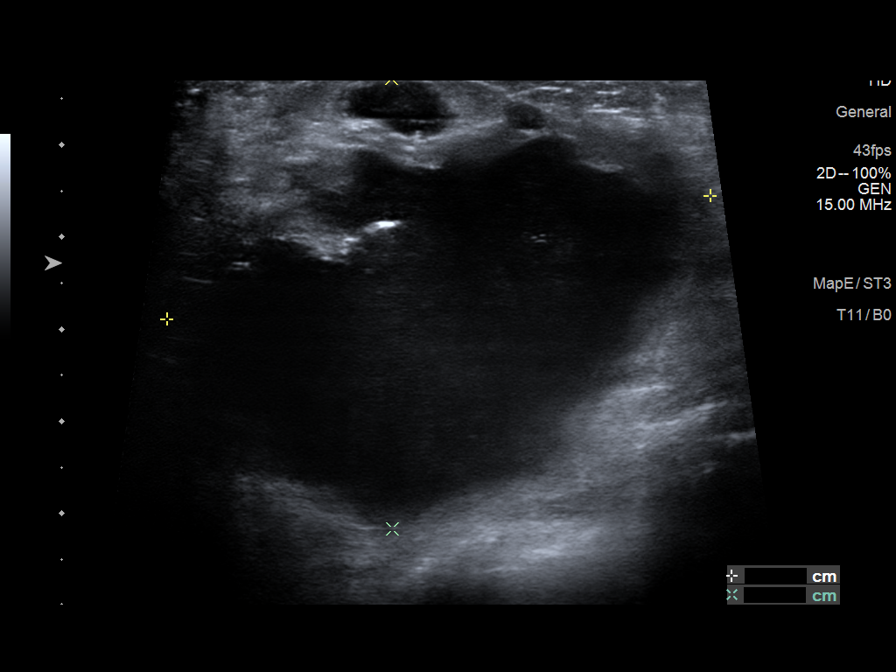
[im 3/7]
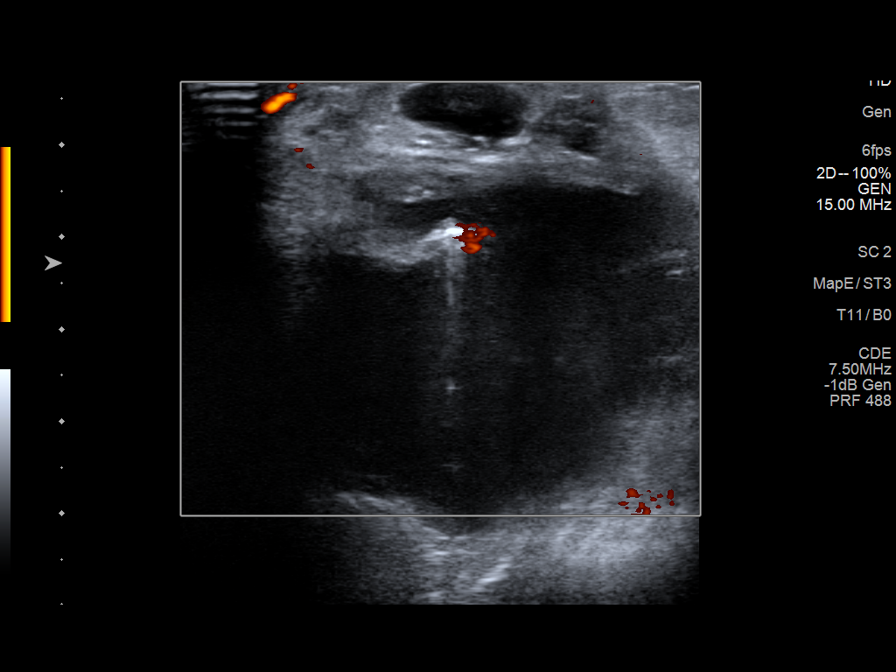
[im 4/7]
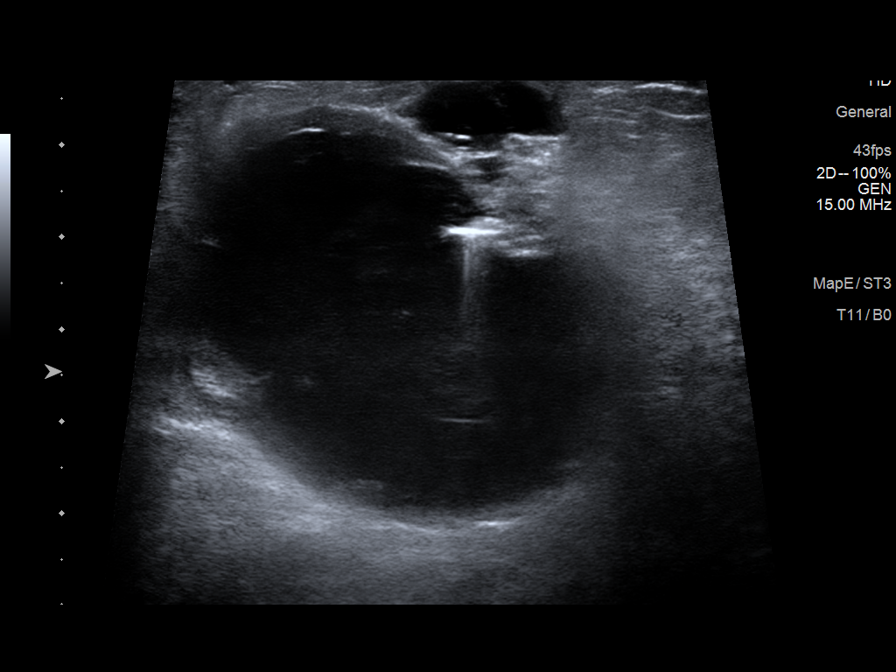
[im 5/7]
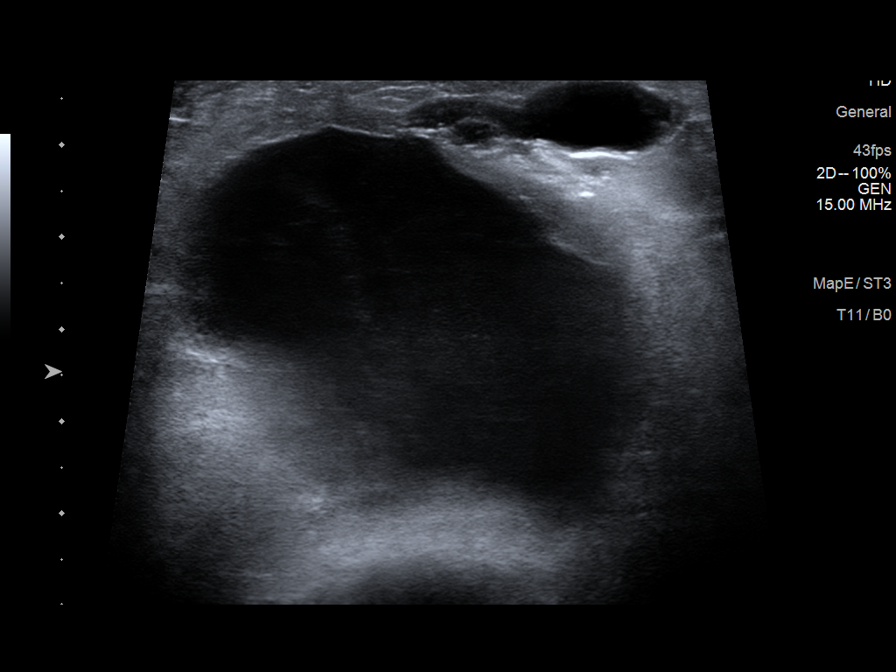
[im 6/7]
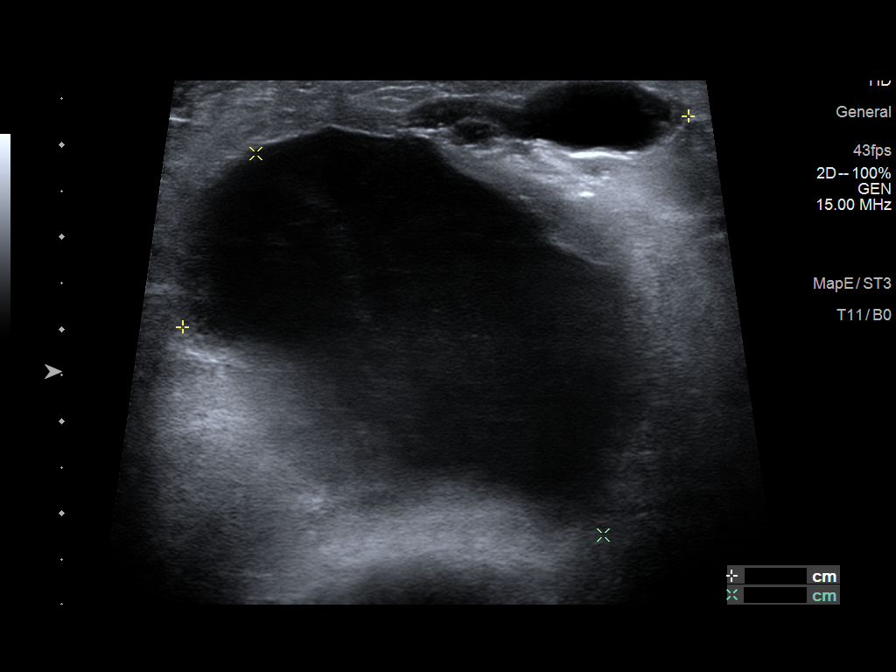
[im 7/7]
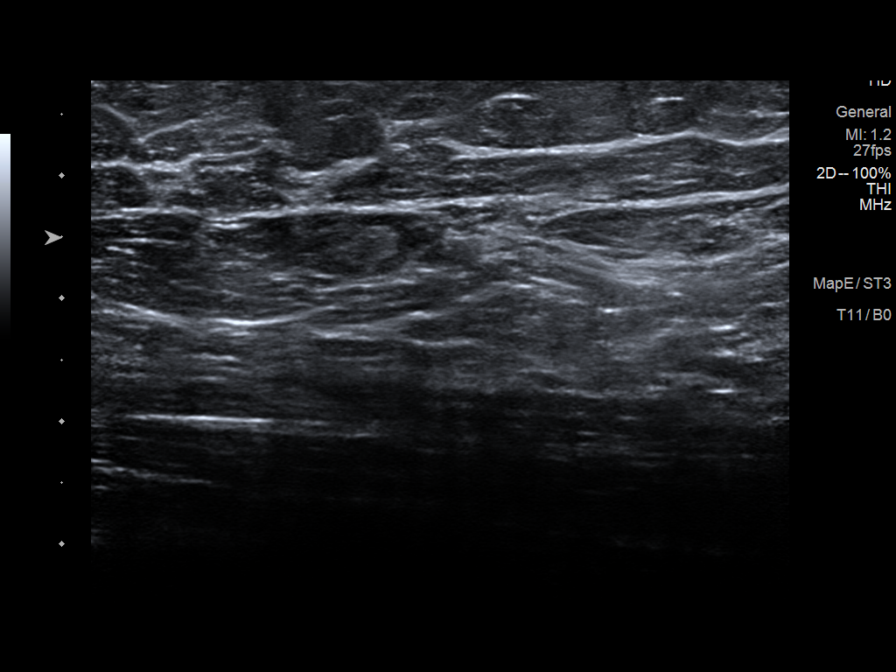

[7 of 7 positions shown; findings below may reference images not displayed]

ACR Breast Density Category c: The breast tissue is heterogeneously
dense, which may obscure small masses.
FINDINGS: Tomosynthesis and synthesized full field CC and MLO views of the
RIGHT breast were obtained.

Since the prior diagnostic mammogram [DATE], the mass in the
LOWER and central RIGHT breast has significantly increased in size,
current measurements approximating 6.5 x 5.5 x 5.4 cm (AP x
transverse x craniocaudal). What is either a second new mass or a
focal lobular component of the large mass is present anteriorly.
Mild skin thickening and skin retraction in the LOWER RIGHT breast
is noted as seen on the prior Breast MRI in [DATE].

Mammographic images were processed with CAD.

On correlative physical exam, there is a visible and palpable firm
approximate 6 cm mass in the RIGHT breast.

Targeted RIGHT breast ultrasound is performed, showing a large
necrotic mass at the 6 o'clock position approximately 1 cm from the
nipple, significantly increased in size since the prior ultrasound
[DATE]. Currently, best estimated measurements of the mass are
approximately 5.6 x 6.0 x 6.0 cm (previously 2.7 x 3.3 x 3.2 cm).
The echogenic tissue marker clip placed at the time of biopsy is
readily apparent. The necrotic mass extends to the skin surface
anteriorly.

Sonographic evaluation of the RIGHT axilla demonstrates no
pathologic lymphadenopathy.
IMPRESSION: 1. Marked interval increase in size of the necrotic mass involving
LOWER and central RIGHT breast, measurements given above.
2. No pathologic RIGHT axillary lymphadenopathy.

RECOMMENDATION:
1. Treatment plan for the biopsy-proven malignancy in the RIGHT
breast.
2. If the patient does not have a prophylactic LEFT mastectomy,
screening mammographic evaluation of the LEFT breast would be due in
late [REDACTED] or [DATE].

I have discussed the findings and recommendations with the patient.
If applicable, a reminder letter will be sent to the patient
regarding the next appointment.

BI-RADS CATEGORY  6: Known biopsy-proven malignancy.

## 2019-03-20 NOTE — Telephone Encounter (Signed)
Tried to reach patient to discuss her interim mammogram and [place] from today. No answer x2. I have reached out to her primary oncologist Dr. Lindi Adie and surgeon Dr. Lucia Gaskins to discuss her case. Will follow up.   Cira Rue, NP

## 2019-03-20 NOTE — Telephone Encounter (Signed)
Error

## 2019-03-20 NOTE — Telephone Encounter (Signed)
I reached Kara Pittman to discuss her mammo/US from today which does show the right breast mass is significantly larger than initially in 12/2018 which is what she observes as well. She is anxious. I reviewed that we would likely get more imaging such as possibly staging PET (or CT CAP/bone scan if not approved) and/or bilateral breast MRI to r/o adenopathy, satellite lesions, or distant metastasis. She would like to proceed with that. She is interested in Hydrographic surveyor inj through her port rather than peripheral IV if possible. I will look into that. I have reached out to her surgeon Dr. Lucia Gaskins as well for his input. She knows we will f/u with her early next week with the plan. She understands and appreciates the call.  I discussed this case with Dr. Burr Medico my supervising MD in Dr. Geralyn Flash absence.   Cira Rue, NP

## 2019-03-23 ENCOUNTER — Telehealth: Payer: Self-pay | Admitting: *Deleted

## 2019-03-23 ENCOUNTER — Other Ambulatory Visit: Payer: Self-pay | Admitting: Adult Health

## 2019-03-23 DIAGNOSIS — Z171 Estrogen receptor negative status [ER-]: Secondary | ICD-10-CM

## 2019-03-23 DIAGNOSIS — C50311 Malignant neoplasm of lower-inner quadrant of right female breast: Secondary | ICD-10-CM

## 2019-03-23 NOTE — Telephone Encounter (Signed)
Called pt to assess needs and discuss future appts for this week. No answer and unable to leave vm d/t mail box is full.

## 2019-03-23 NOTE — Progress Notes (Signed)
Patient breast cancer is growing on Taxol.  I called and discussed with Dr. Lindi Adie.  Order placed for breast MRI, will contact navigators to get patient appt with surgery this week as well.   Wilber Bihari, NP

## 2019-03-24 ENCOUNTER — Encounter: Payer: Self-pay | Admitting: *Deleted

## 2019-03-24 ENCOUNTER — Other Ambulatory Visit: Payer: Self-pay | Admitting: Adult Health

## 2019-03-24 ENCOUNTER — Telehealth: Payer: Self-pay | Admitting: *Deleted

## 2019-03-24 NOTE — Telephone Encounter (Signed)
Called pt again to discuss appts and scheduling of breast MRI. Unable to leave vm d/t mail box full.

## 2019-03-25 ENCOUNTER — Ambulatory Visit
Admission: RE | Admit: 2019-03-25 | Discharge: 2019-03-25 | Disposition: A | Discharge status: Home / Self Care | Payer: Medicaid Other | Source: Ambulatory Visit | Attending: Adult Health | Admitting: Adult Health

## 2019-03-25 ENCOUNTER — Other Ambulatory Visit: Payer: Self-pay

## 2019-03-25 ENCOUNTER — Other Ambulatory Visit: Payer: Medicaid Other

## 2019-03-25 ENCOUNTER — Ambulatory Visit: Payer: Medicaid Other

## 2019-03-25 ENCOUNTER — Telehealth: Payer: Self-pay | Admitting: Hematology and Oncology

## 2019-03-25 DIAGNOSIS — C50311 Malignant neoplasm of lower-inner quadrant of right female breast: Secondary | ICD-10-CM

## 2019-03-25 DIAGNOSIS — Z171 Estrogen receptor negative status [ER-]: Secondary | ICD-10-CM

## 2019-03-25 IMAGING — MR MR BREAST BILAT WO/W CM
8 of 12 series · 31 of 48 positions shown · IV contrast (gadavist)
Comparison: Previous exam(s).

CLINICAL DATA: 53-year-old female with biopsy-proven invasive
breast cancer with metaplastic features involving the inferior right
breast. The patient was diagnosed in [DATE] and is currently
undergoing neoadjuvant chemotherapy. She presents today for
follow-up evaluation of response to therapy. Clinically, the mass is
enlarging and painful.

LABS:  None performed on site.
EXAM:
BILATERAL BREAST MRI WITH AND WITHOUT CONTRAST
TECHNIQUE: Multiplanar, multisequence MR images of both breasts were obtained
prior to and following the intravenous administration of 7 ml of
Gadavist.

[Series 2: t2_tirm_tra ipat (a-p) · axial · 3.0mm · 0.70mm/px · 1 of 55 slices shown]
[im 1/55]
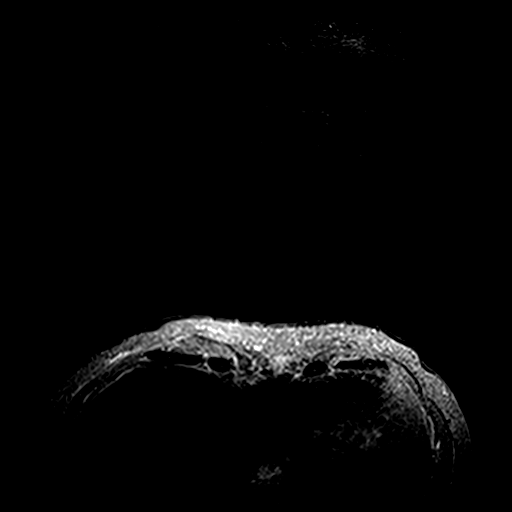

[Series 3: fl3d pre-cm no · axial · non-contrast · 1.2mm · 0.94mm/px · z∈[-47,+124]mm · 5 of 144 slices shown]
[im 1/144]
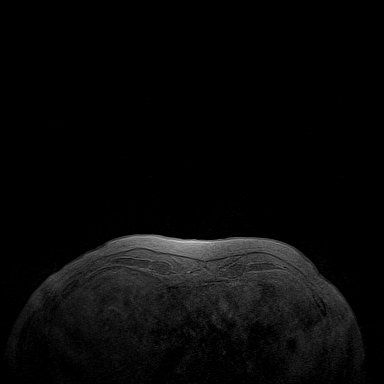
[im 36/144]
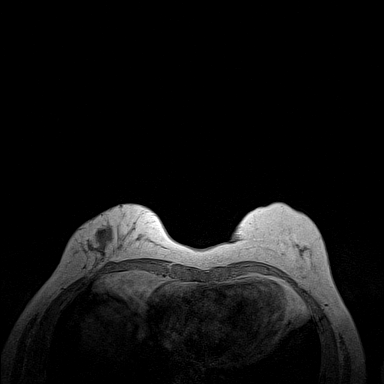
[im 72/144]
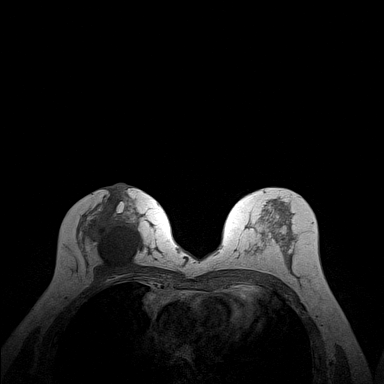
[im 108/144]
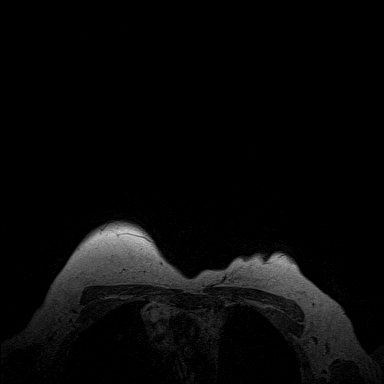
[im 144/144]
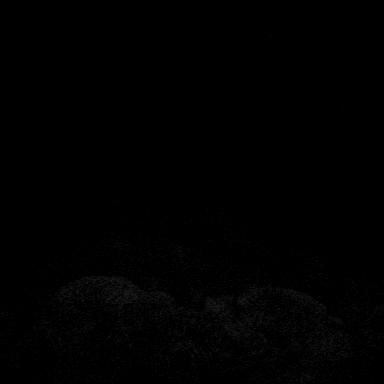

[Series 4: fl3d pre-cm · axial · non-contrast · 1.2mm · 0.94mm/px · z∈[-47,+124]mm · 5 of 144 slices shown]
[im 1/144]
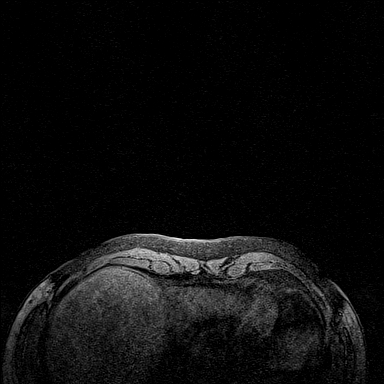
[im 36/144]
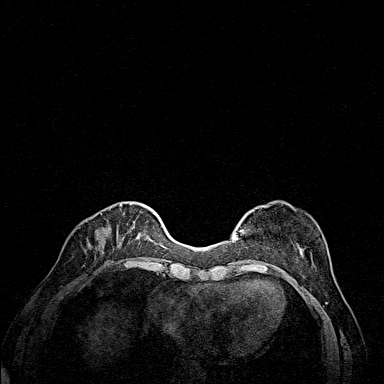
[im 72/144]
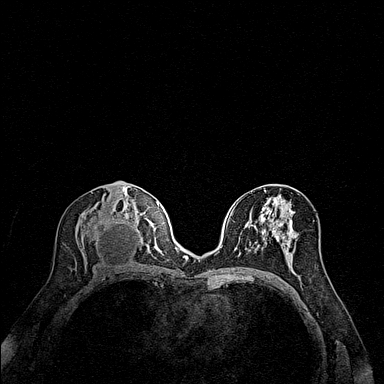
[im 108/144]
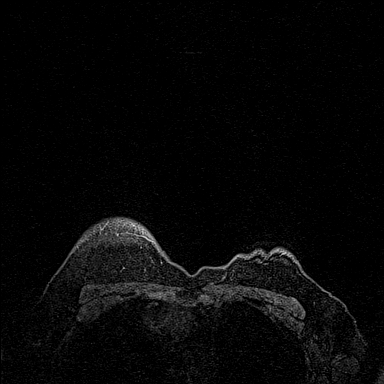
[im 144/144]
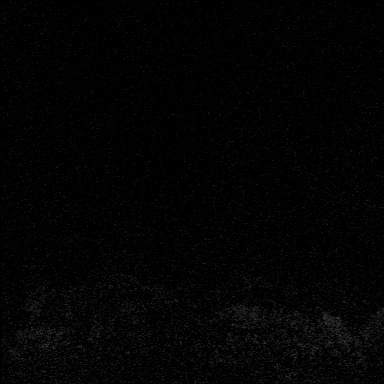

[Series 5: fl3d post-cm 20 · axial · 1.2mm · 0.94mm/px · z∈[-47,+124]mm · 5 of 144 slices shown (1 of 3)]
[im 1/144]
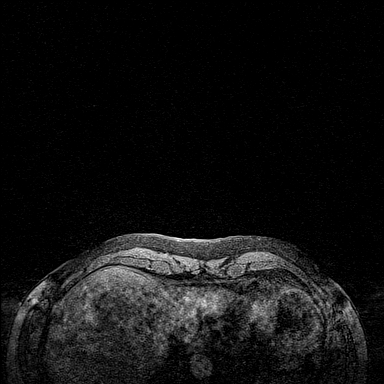
[im 36/144]
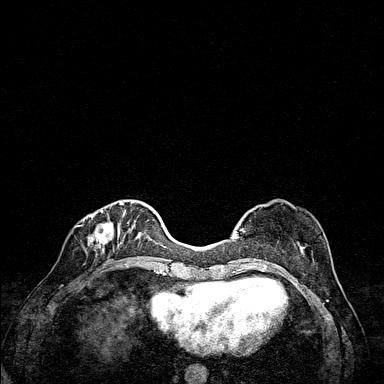
[im 72/144]
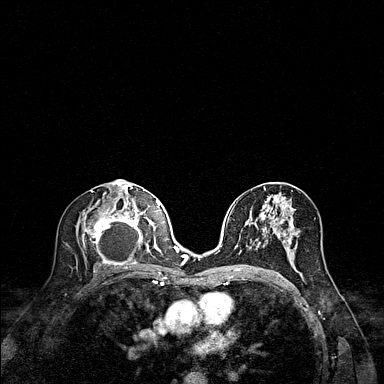
[im 108/144]
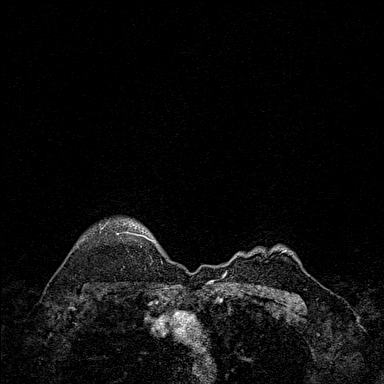
[im 144/144]
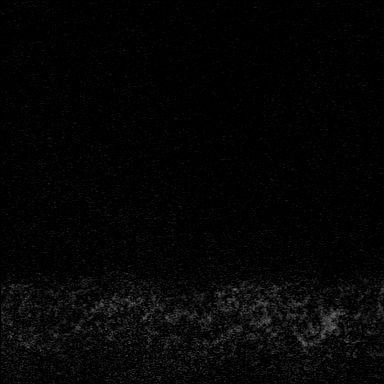

[Series 6: fl3d post-cm 20 · axial · 1.2mm · 0.94mm/px · z∈[-47,+124]mm · 5 of 144 slices shown (2 of 3)]
[im 1/144]
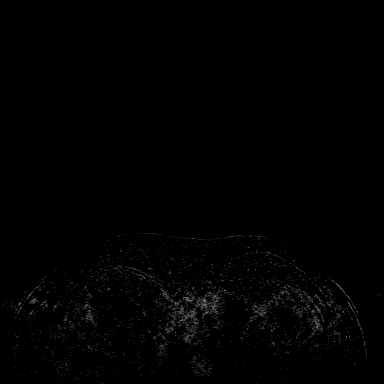
[im 36/144]
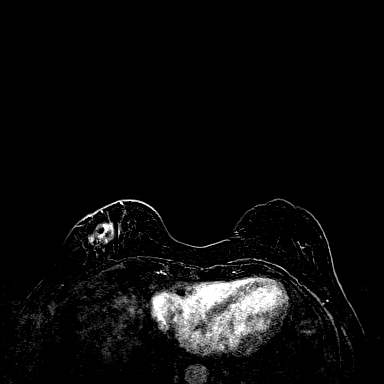
[im 72/144]
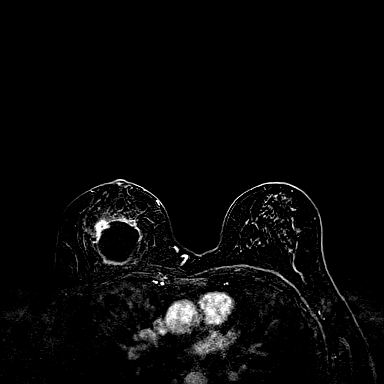
[im 108/144]
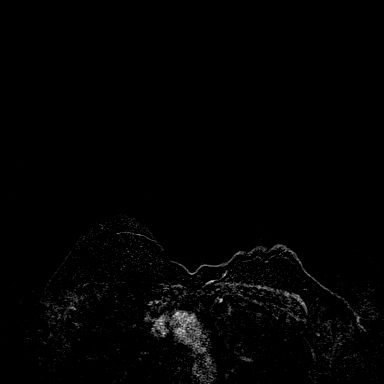
[im 144/144]
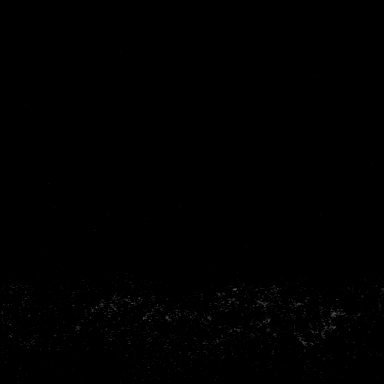

[Series 7: fl3d post-cm 20 · axial · 172.8mm · 0.94mm/px · 1 of 1 slices shown (3 of 3)]
[im 1/1]
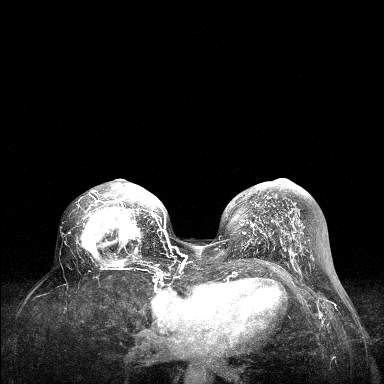

[Series 8: fl3d post-cm 3min · axial · 1.2mm · 0.94mm/px · z∈[-47,+124]mm · 6 of 144 slices shown]
[im 1/144]
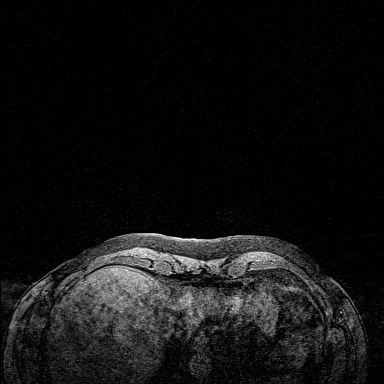
[im 29/144]
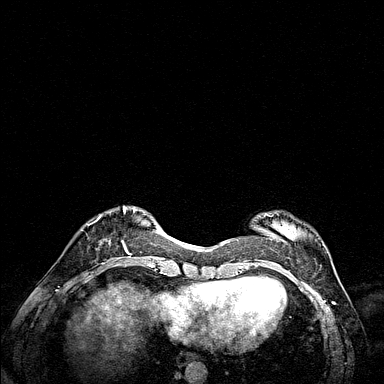
[im 58/144]
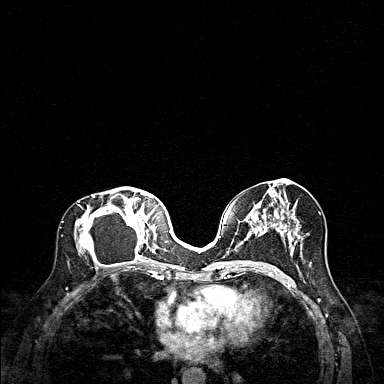
[im 86/144]
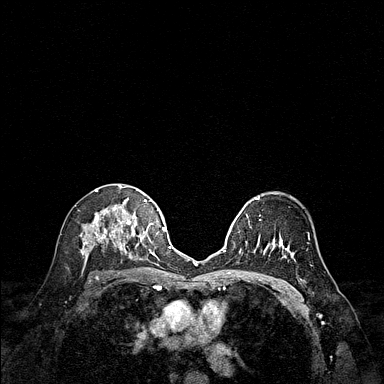
[im 115/144]
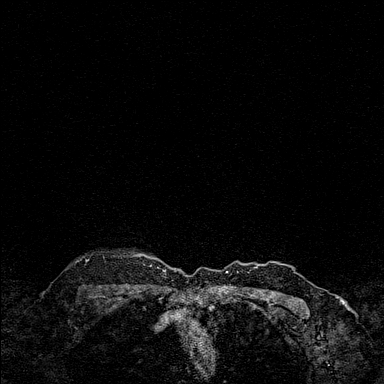
[im 144/144]
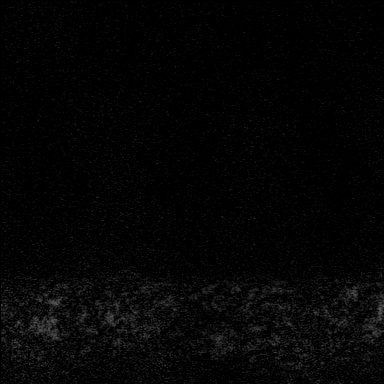

[Series 9: fl3d post-cm 3min_sub · axial · 1.2mm · 0.94mm/px · z∈[-47,+21]mm · 3 of 144 slices shown]
[im 1/144]
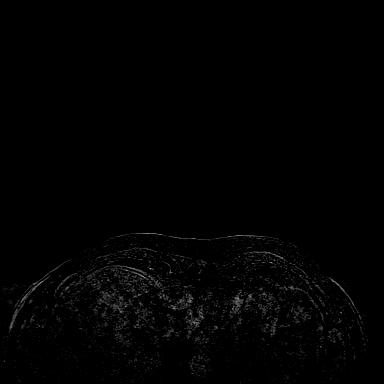
[im 29/144]
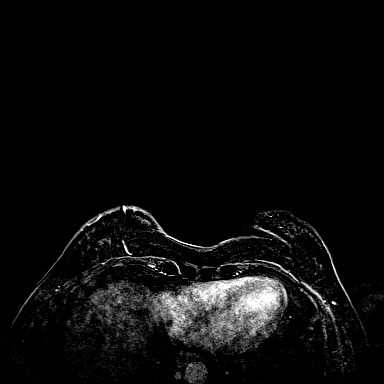
[im 58/144]
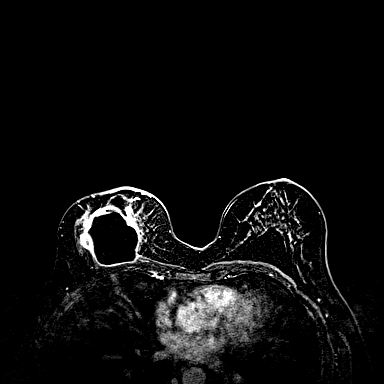

[31 of 48 positions shown; findings below may reference images not displayed]

Three-dimensional MR images were rendered by post-processing of the
original MR data on an independent workstation. The
three-dimensional MR images were interpreted, and findings are
reported in the following complete MRI report for this study. Three
dimensional images were evaluated at the independent [employer]
workstation
FINDINGS: Breast composition: c. Heterogeneous fibroglandular tissue.

Background parenchymal enhancement: Moderate.

Right breast: In the inferior right breast, at the site of the
patient's biopsy proven malignancy, there is an enlarging fluid
collection, now measuring 5.1 x 5.0 x 5.9 cm. The fluid collection,
though larger in size, has a more simple and less complex appearance
compared to prior study. Mild peripheral enhancement remains, though
the overall thickness and nodularity of the enhancement has
decreased. A second, similar-appearing fluid collection is seen
anterior and medial to the main collection. This measures 2.9 x
x 1.8 cm.

Additionally, there has been interval decrease in the amount of non
mass enhancement surrounding the mass. The overall appearance is
suggestive of decreased enhancement in nodularity in association to
the patient's primary malignancy, though associated necrosis, fluid
has increased.

While the fluid collection abuts the underlying pectoralis muscle,
no significant muscular enhancement is identified. There is been
interval decrease in skin thickening and enhancement overlying the
mass. No additional suspicious findings are identified within the
remainder of the right breast.

Left breast: No suspicious mass or enhancement.

Lymph nodes: No abnormal appearing lymph nodes.

Ancillary findings:  None.
IMPRESSION: 1. Two adjacent necrotic fluid collections in the inferior right
breast corresponding with the patient's site of known malignancy.
The overall size and morphology is similar when compared to recent
ultrasound evaluation. While the size of this collection has
increased compared to [DATE], the fluid itself does not
enhance on today's study.
2. Improvement in peripheral enhancement surrounding the fluid
collection, which has decreased in thickness and nodularity compared
to [DATE]. Additionally, surrounding non-mass parenchymal
enhancement has resolved.
3. The overall appearance suggests some response to chemotherapy
with a decrease in malignant enhancement but increase in size of
nonenhancing fluid collection/necrosis.
4. No MRI evidence of malignancy on the left.
5. No suspicious lymphadenopathy.

RECOMMENDATION:
Per clinical treatment plan.

BI-RADS CATEGORY  6: Known biopsy-proven malignancy.

## 2019-03-25 MED ORDER — GADOBUTROL 1 MMOL/ML IV SOLN
7.0000 mL | Freq: Once | INTRAVENOUS | Status: AC | PRN
  Administered 2019-03-25: 7 mL via INTRAVENOUS

## 2019-03-25 NOTE — Telephone Encounter (Signed)
I reviewed the breast MRI results with the patient.

## 2019-03-25 NOTE — Progress Notes (Signed)
Patient Care Team: [name], MD as PCP - General (Family Medicine) [name], RN as Oncology Nurse Navigator [name], RN as Oncology Nurse Navigator [name], MD as Consulting Physician (General Surgery) [name], MD as Consulting Physician (Hematology and Oncology) [name], MD as Consulting Physician (Radiation Oncology) [name], RN as Registered Nurse (Medical Oncology)  DIAGNOSIS:    ICD-10-CM   1. Malignant neoplasm of lower-inner quadrant of right breast of female, estrogen receptor negative ([place])  C50.311    Z17.1     SUMMARY OF ONCOLOGIC HISTORY: Oncology History  Malignant neoplasm of lower-inner quadrant of right breast of female, estrogen receptor negative ([place])  12/12/2018 Initial Diagnosis   Patient palpated right breast mass. Mammogram showed a 3.3cm solid and cystic right breast mass, 6 o'clock position, no abnormal right axillary lymph nodes. 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    12/17/2018 Cancer Staging   Staging form: Breast, AJCC 8th Edition - Clinical stage from 12/17/2018: Stage IIB (cT2, cN0, cM0, G3, ER-, PR-, HER2-) - Signed by [name], MD on 12/17/2018   12/23/2018 Genetic Testing   Negative genetic testing:  No pathogenic variants detected on the [facility] Multi-Cancer panel. A variant of uncertain significance was detected in the BARD1 gene called c.26_40dup. The report date is 12/23/2018.  The Multi-Cancer Panel offered by [facility] includes sequencing and/or deletion duplication testing of the following 85 genes: AIP, ALK, APC, ATM, AXIN2,BAP1,  BARD1, BLM, BMPR1A, BRCA1, BRCA2, BRIP1, CASR, CDC73, CDH1, CDK4, CDKN1B, CDKN1C, CDKN2A (p14ARF), CDKN2A (p16INK4a), CEBPA, CHEK2, CTNNA1, DICER1, DIS3L2, EGFR (c.2369C>T, p.Thr790Met variant only), EPCAM (Deletion/duplication testing only), FH, FLCN, GATA2, GPC3, GREM1 (Promoter region  deletion/duplication testing only), HOXB13 (c.251G>A, p.Gly84Glu), HRAS, KIT, MAX, MEN1, MET, MITF (c.952G>A, p.Glu318Lys variant only), MLH1, MSH2, MSH3, MSH6, MUTYH, NBN, NF1, NF2, NTHL1, PALB2, PDGFRA, PHOX2B, PMS2, POLD1, POLE, POT1, PRKAR1A, PTCH1, PTEN, RAD50, RAD51C, RAD51D, RB1, RECQL4, RET, RNF43, RUNX1, SDHAF2, SDHA (sequence changes only), SDHB, SDHC, SDHD, SMAD4, SMARCA4, SMARCB1, SMARCE1, STK11, SUFU, TERC, TERT, TMEM127, TP53, TSC1, TSC2, VHL, WRN and WT1.     12/30/2018 -  Chemotherapy   The patient had DOXOrubicin (ADRIAMYCIN) chemo injection 102 mg, 60 mg/m2 = 102 mg, Intravenous,  Once, 4 of 4 cycles Dose modification: 50 mg/m2 (original dose 60 mg/m2, Cycle 3, Reason: Dose not tolerated) Administration: 102 mg (12/30/2018), 102 mg (01/15/2019), 86 mg (01/28/2019), 86 mg (02/11/2019) palonosetron (ALOXI) injection 0.25 mg, 0.25 mg, Intravenous,  Once, 6 of 8 cycles Administration: 0.25 mg (12/30/2018), 0.25 mg (02/26/2019), 0.25 mg (01/15/2019), 0.25 mg (01/28/2019), 0.25 mg (02/11/2019), 0.25 mg (03/18/2019) ondansetron (ZOFRAN) injection 8 mg, 8 mg (100 % of original dose 8 mg), Intravenous,  Once, 2 of 4 cycles Dose modification: 8 mg (original dose 8 mg, Cycle 5) Administration: 8 mg (03/05/2019), 8 mg (03/12/2019) pegfilgrastim-cbqv (UDENYCA) injection 6 mg, 6 mg, Subcutaneous, Once, 4 of 4 cycles Administration: 6 mg (01/01/2019), 6 mg (01/17/2019), 6 mg (01/30/2019), 6 mg (02/13/2019) CARBOplatin (PARAPLATIN) 600 mg in sodium chloride 0.9 % 250 mL chemo infusion, 600 mg (120.7 % of original dose 500.4 mg), Intravenous,  Once, 2 of 4 cycles Dose modification:   (original dose 500.4 mg, Cycle 5) Administration: 600 mg (02/26/2019), 600 mg (03/18/2019) cyclophosphamide (CYTOXAN) 1,020 mg in sodium chloride 0.9 % 250 mL chemo infusion, 600 mg/m2 = 1,020 mg, Intravenous,  Once, 4 of 4 cycles Dose modification: 500 mg/m2 (original dose 600 mg/m2, Cycle  3, Reason: Dose not tolerated)  Administration: 1,020 mg (12/30/2018), 1,020 mg (01/15/2019), 860 mg (01/28/2019), 860 mg (02/11/2019) PACLitaxel (TAXOL) 138 mg in sodium chloride 0.9 % 250 mL chemo infusion (</= 22m/m2), 80 mg/m2 = 138 mg, Intravenous,  Once, 2 of 4 cycles Administration: 138 mg (02/26/2019), 138 mg (03/05/2019), 138 mg (03/12/2019), 138 mg (03/18/2019) fosaprepitant (EMEND) 150 mg, dexamethasone (DECADRON) 12 mg in sodium chloride 0.9 % 145 mL IVPB, , Intravenous,  Once, 6 of 8 cycles Administration:  (12/30/2018),  (02/26/2019),  (01/15/2019),  (01/28/2019),  (02/11/2019),  (03/18/2019)  for chemotherapy treatment.      CHIEF COMPLIANT: Cycle5 Taxol  INTERVAL HISTORY: Kara MABEis a 54y.o. 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 She is currently on neoadjuvant chemotherapy withweekly Taxol and Carboplatin every 3 weeks after completing 4 cycles of Adriamycin and Cytoxan. She is a participant in the UpBeat clinical trial.Mammogram and [place]19 showed increase in the size of the right breast mass from 3.3cm to 6.0cm. Breast MRI on 03/25/19 showed a slight decrease in malignant enhancement and increase in the size of non-enhancing fluid collection and necrosis. She presents to the clinic todayfora toxicity check and cycle 5. Patient experiences discomfort especially when she lies on that side of the breast.  ALLERGIES:  has No Known Allergies.  MEDICATIONS:  Current Outpatient Medications  Medication Sig Dispense Refill  . acetaminophen (TYLENOL) 325 MG tablet Take by mouth.    .Marland Kitchenb complex vitamins capsule Take by mouth.    .Marland KitchenbuPROPion (WELLBUTRIN XL) 150 MG 24 hr tablet Take 1 tablet by mouth daily 90 tablet 3  . CYTOMEL 5 MCG tablet Take 1 tablet by mouth once daily 90 tablet 3  . lidocaine-prilocaine (EMLA) cream Apply to affected area once 30 g 3  . LORazepam (ATIVAN) 0.5 MG tablet Take 1 tablet (0.5 mg total) by mouth at bedtime as needed for sleep. 30 tablet 0  .  Multiple Vitamin (MULTIVITAMIN) capsule Take by mouth.    . ondansetron (ZOFRAN-ODT) 8 MG disintegrating tablet Take 1 tablet (8 mg total) by mouth every 8 (eight) hours as needed for nausea or vomiting. 20 tablet 3  . pantoprazole (PROTONIX) 40 MG tablet Take 1 tablet (40 mg total) by mouth daily. 30 tablet 3  . prochlorperazine (COMPAZINE) 10 MG tablet TAKE 1 TABLET BY MOUTH EVERY 6 HOURS AS NEEDED FOR NAUSEA AND/OR VOMITING 30 tablet 1  . promethazine (PHENERGAN) 25 MG suppository Place 1 suppository (25 mg total) rectally every 6 (six) hours as needed for nausea or vomiting. 12 each 1  . SYNTHROID 75 MCG tablet Take 1 tablet by mouth once daily 90 tablet 3   No current facility-administered medications for this visit.   Facility-Administered Medications Ordered in Other Visits  Medication Dose Route Frequency Provider Last Rate Last Admin  . dexamethasone (DECADRON) 20 mg in sodium chloride 0.9 % 50 mL IVPB  20 mg Intravenous Once GNicholas Lose MD      . famotidine (PEPCID) IVPB 20 mg premix  20 mg Intravenous Once GNicholas Lose MD 200 mL/hr at 03/26/19 1241 20 mg at 03/26/19 1241  . heparin lock flush 100 unit/mL  500 Units Intracatheter Once PRN GNicholas Lose MD      . PACLitaxel (TAXOL) 138 mg in sodium chloride 0.9 % 250 mL chemo infusion (</= 877mm2)  80 mg/m2 (Treatment Plan Recorded) Intravenous Once GuNicholas LoseMD      . sodium chloride flush (NS) 0.9 %  injection 10 mL  10 mL Intracatheter PRN [name], MD        PHYSICAL EXAMINATION: ECOG PERFORMANCE STATUS: 1 - Symptomatic but completely ambulatory  Vitals:   03/26/19 1140  BP: (!) 128/91  Pulse: 94  Resp: 18  Temp: 98.5 F (36.9 C)  SpO2: 100%   Filed Weights   03/26/19 1140  Weight: 156 lb 14.4 oz (71.2 kg)    LABORATORY DATA:  I have reviewed the data as listed CMP Latest Ref Rng & Units 03/26/2019 03/18/2019 03/12/2019  Glucose 70 - 99 mg/dL 87 87 99  BUN 6 - 20 mg/dL 18 15 21(H)  Creatinine 0.44 -  1.00 mg/dL 0.74 0.81 0.89  Sodium 135 - 145 mmol/L 140 144 140  Potassium 3.5 - 5.1 mmol/L 4.1 4.1 4.3  Chloride 98 - 111 mmol/L 108 109 105  CO2 22 - 32 mmol/L _0 Calcium 8.9 - 10.3 mg/dL 8.9 8.9 9.6  Total Protein 6.5 - 8.1 g/dL 6.7 7.1 6.9  Total Bilirubin 0.3 - 1.2 mg/dL 0.2(L) 0.3 0.3  Alkaline Phos 38 - 126 U/L 66 73 65  AST 15 - 41 U/L _1 ALT 0 - 44 U/L 31 49(H) 54(H)    Lab Results  Component Value Date   WBC 3.2 (L) 03/26/2019   HGB 10.5 (L) 03/26/2019   HCT 30.5 (L) 03/26/2019   MCV 98.1 03/26/2019   PLT 185 03/26/2019   NEUTROABS 1.9 03/26/2019    ASSESSMENT & PLAN:  Malignant neoplasm of lower-inner quadrant of right breast of female, estrogen receptor negative ([place]) 12/12/2018:Patient palpated right breast mass. Mammogram showed a 3.3cm solid and cystic right breast mass, 6 o'clock position, no abnormal right axillary lymph nodes. 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Neoadjuvant chemotherapy with dose dense Adriamycin and Cytoxan x4 followed by Taxol weekly x12 2.Breast conserving surgery with sentinel lymph node biopsy 3.Adjuvant radiation Upbeat clinical trial: No adverse effects due to participation in the study ----------------------------------------------------------------------------------------------------------------------------------------------------- Current treatment:Completed 4 cycles ofdose dense Adriamycin and Cytoxan, today is cycle 4 Taxol (carboplatin every 3 weeks) Echocardiogram: 12/22/2018: EF 60 to 65%  Chemo toxicities: 1.Nausea: improved. 2.Acid reflux:onProtonix 3.Alopecia 4. Chemo induced anemia: Hb today is  10.5  MRI Breast 03/25/19: Done because patient was complaining of increase in breast mass. Enlarging fluid collection 5.9 cm. Overall thickness and nodularity decreased. Second fluid collection  2.9 cm. Dec in NME surrounding the mass. Fluid abuts the pectoral muscle but the muscle does not have enhancement.  We discussed pros and cons of continuing chemo vs proceeding to surgery. Based on excellent response to treatment, I recommended continuation of the same treatment. We will cancel her appointment with Dr. Barry Dienes for tomorrow.   No orders of the defined types were placed in this encounter.  The patient has a good understanding of the overall plan. she agrees with it. she will call with any problems that may develop before the next visit here.  Total time spent: 30 mins including face to face time and time spent for planning, charting and coordination of care  [name], MD 03/26/2019  I, Cloyde Reams Dorshimer, am acting as scribe for Dr. [name].  I have reviewed the above documentation for accuracy and completeness, and I agree with the above.

## 2019-03-26 ENCOUNTER — Inpatient Hospital Stay: Payer: Medicaid Other

## 2019-03-26 ENCOUNTER — Encounter: Payer: Self-pay | Admitting: *Deleted

## 2019-03-26 ENCOUNTER — Inpatient Hospital Stay (HOSPITAL_BASED_OUTPATIENT_CLINIC_OR_DEPARTMENT_OTHER): Payer: Medicaid Other | Admitting: Hematology and Oncology

## 2019-03-26 ENCOUNTER — Encounter: Payer: Self-pay | Admitting: General Practice

## 2019-03-26 ENCOUNTER — Other Ambulatory Visit: Payer: Self-pay

## 2019-03-26 DIAGNOSIS — Z95828 Presence of other vascular implants and grafts: Secondary | ICD-10-CM | POA: Insufficient documentation

## 2019-03-26 DIAGNOSIS — Z5111 Encounter for antineoplastic chemotherapy: Secondary | ICD-10-CM | POA: Diagnosis not present

## 2019-03-26 DIAGNOSIS — Z171 Estrogen receptor negative status [ER-]: Secondary | ICD-10-CM

## 2019-03-26 DIAGNOSIS — C50311 Malignant neoplasm of lower-inner quadrant of right female breast: Secondary | ICD-10-CM

## 2019-03-26 LAB — CMP (CANCER CENTER ONLY)
ALT: 31 U/L (ref 0–44)
AST: 19 U/L (ref 15–41)
Albumin: 3.9 g/dL (ref 3.5–5.0)
Alkaline Phosphatase: 66 U/L (ref 38–126)
Anion gap: 6 (ref 5–15)
BUN: 18 mg/dL (ref 6–20)
CO2: 26 mmol/L (ref 22–32)
Calcium: 8.9 mg/dL (ref 8.9–10.3)
Chloride: 108 mmol/L (ref 98–111)
Creatinine: 0.74 mg/dL (ref 0.44–1.00)
GFR, Est AFR Am: >60 mL/min (ref >60)
GFR, Estimated: >60 mL/min (ref >60)
Glucose, Bld: 87 mg/dL (ref 70–99)
Potassium: 4.1 mmol/L (ref 3.5–5.1)
Sodium: 140 mmol/L (ref 135–145)
Total Bilirubin: 0.2 mg/dL — ABNORMAL LOW (ref 0.3–1.2)
Total Protein: 6.7 g/dL (ref 6.5–8.1)

## 2019-03-26 LAB — CBC WITH DIFFERENTIAL (CANCER CENTER ONLY)
Abs Immature Granulocytes: 0.01 10*3/uL (ref 0.00–0.07)
Basophils Absolute: 0 10*3/uL (ref 0.0–0.1)
Basophils Relative: 0 %
Eosinophils Absolute: 0.1 10*3/uL (ref 0.0–0.5)
Eosinophils Relative: 2 %
HCT: 30.5 % — ABNORMAL LOW (ref 36.0–46.0)
Hemoglobin: 10.5 g/dL — ABNORMAL LOW (ref 12.0–15.0)
Immature Granulocytes: 0 %
Lymphocytes Relative: 33 %
Lymphs Abs: 1 10*3/uL (ref 0.7–4.0)
MCH: 33.8 pg (ref 26.0–34.0)
MCHC: 34.4 g/dL (ref 30.0–36.0)
MCV: 98.1 fL (ref 80.0–100.0)
Monocytes Absolute: 0.2 10*3/uL (ref 0.1–1.0)
Monocytes Relative: 6 %
Neutro Abs: 1.9 10*3/uL (ref 1.7–7.7)
Neutrophils Relative %: 59 %
Platelet Count: 185 10*3/uL (ref 150–400)
RBC: 3.11 MIL/uL — ABNORMAL LOW (ref 3.87–5.11)
RDW: 16 % — ABNORMAL HIGH (ref 11.5–15.5)
WBC Count: 3.2 10*3/uL — ABNORMAL LOW (ref 4.0–10.5)
nRBC: 0 % (ref 0.0–0.2)

## 2019-03-26 MED ORDER — FAMOTIDINE IN NACL 20-0.9 MG/50ML-% IV SOLN
INTRAVENOUS | Status: AC
  Filled 2019-03-26: qty 50

## 2019-03-26 MED ORDER — SODIUM CHLORIDE 0.9 % IV SOLN
Freq: Once | INTRAVENOUS | Status: AC
  Filled 2019-03-26: qty 250

## 2019-03-26 MED ORDER — SODIUM CHLORIDE 0.9 % IV SOLN
80.0000 mg/m2 | Freq: Once | INTRAVENOUS | Status: AC
  Administered 2019-03-26: 14:00:00 138 mg via INTRAVENOUS
  Filled 2019-03-26: qty 23

## 2019-03-26 MED ORDER — SODIUM CHLORIDE 0.9% FLUSH
10.0000 mL | Freq: Once | INTRAVENOUS | Status: AC
  Administered 2019-03-26: 10 mL
  Filled 2019-03-26: qty 10

## 2019-03-26 MED ORDER — DIPHENHYDRAMINE HCL 50 MG/ML IJ SOLN
INTRAMUSCULAR | Status: AC
  Filled 2019-03-26: qty 1

## 2019-03-26 MED ORDER — SODIUM CHLORIDE 0.9 % IV SOLN
20.0000 mg | Freq: Once | INTRAVENOUS | Status: AC
  Administered 2019-03-26: 20 mg via INTRAVENOUS
  Filled 2019-03-26: qty 20

## 2019-03-26 MED ORDER — DIPHENHYDRAMINE HCL 50 MG/ML IJ SOLN
25.0000 mg | Freq: Once | INTRAMUSCULAR | Status: AC
  Administered 2019-03-26: 25 mg via INTRAVENOUS

## 2019-03-26 MED ORDER — ONDANSETRON HCL 4 MG/2ML IJ SOLN
8.0000 mg | Freq: Once | INTRAMUSCULAR | Status: AC
  Administered 2019-03-26: 8 mg via INTRAVENOUS

## 2019-03-26 MED ORDER — ONDANSETRON HCL 4 MG/2ML IJ SOLN
INTRAMUSCULAR | Status: AC
  Filled 2019-03-26: qty 4

## 2019-03-26 MED ORDER — HEPARIN SOD (PORK) LOCK FLUSH 100 UNIT/ML IV SOLN
500.0000 [IU] | Freq: Once | INTRAVENOUS | Status: AC | PRN
  Administered 2019-03-26: 500 [IU]
  Filled 2019-03-26: qty 5

## 2019-03-26 MED ORDER — SODIUM CHLORIDE 0.9% FLUSH
10.0000 mL | INTRAVENOUS | Status: DC | PRN
  Administered 2019-03-26: 10 mL
  Filled 2019-03-26: qty 10

## 2019-03-26 MED ORDER — FAMOTIDINE IN NACL 20-0.9 MG/50ML-% IV SOLN
20.0000 mg | Freq: Once | INTRAVENOUS | Status: AC
  Administered 2019-03-26: 20 mg via INTRAVENOUS

## 2019-03-26 NOTE — Progress Notes (Signed)
[place] Spiritual Care Note  Because I knew I would miss Kara Pittman in infusion today, I left a handwritten note of care and encouragement with a prayer shawl and coordinating bone pillow for her to receive at her treatment. May these tangible signs of care bring her some comfort as she continues to grieve her son's passing.   [place], [place], [facility] Pager [phone] Voicemail [phone]

## 2019-03-26 NOTE — Assessment & Plan Note (Signed)
12/12/2018:Patient palpated right breast mass. Mammogram showed a 3.3cm solid and cystic right breast mass, 6 o'clock position, no abnormal right axillary lymph nodes. 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Neoadjuvant chemotherapy with dose dense Adriamycin and Cytoxan x4 followed by Taxol weekly x12 2.Breast conserving surgery with sentinel lymph node biopsy 3.Adjuvant radiation Upbeat clinical trial: No adverse effects due to participation in the study ----------------------------------------------------------------------------------------------------------------------------------------------------- Current treatment:Completed 4 cycles ofdose dense Adriamycin and Cytoxan, today is cycle 4 Taxol (carboplatin every 3 weeks) Echocardiogram: 12/22/2018: EF 60 to 65%  Chemo toxicities: 1.Nausea: improved. 2.Acid reflux:onProtonix 3.Alopecia 4. Chemo induced anemia: Hb today is   MRI Breast 03/25/19: Done because patient was complaining of increase in breast mass. Enlarging fluid collection 5.9 cm. Overall thickness and nodularity decreased. Second fluid collection 2.9 cm. Dec in NME surrounding the mass. Fluid abuts the pectoral muscle but the muscle does not have enhancement.  We discussed pros and cons of continuing chemo vs proceeding to surgery. 

## 2019-03-26 NOTE — Patient Instructions (Signed)
[facility] Discharge Instructions for Patients Receiving Chemotherapy  Today you received the following chemotherapy agents: Paclitaxel (Taxol)  To help prevent nausea and vomiting after your treatment, we encourage you to take your nausea medication as directed.    If you develop nausea and vomiting that is not controlled by your nausea medication, call the clinic.   BELOW ARE SYMPTOMS THAT SHOULD BE REPORTED IMMEDIATELY:  *FEVER GREATER THAN 100.5 F  *CHILLS WITH OR WITHOUT FEVER  NAUSEA AND VOMITING THAT IS NOT CONTROLLED WITH YOUR NAUSEA MEDICATION  *UNUSUAL SHORTNESS OF BREATH  *UNUSUAL BRUISING OR BLEEDING  TENDERNESS IN MOUTH AND THROAT WITH OR WITHOUT PRESENCE OF ULCERS  *URINARY PROBLEMS  *BOWEL PROBLEMS  UNUSUAL RASH Items with * indicate a potential emergency and should be followed up as soon as possible.  Feel free to call the clinic should you have any questions or concerns. The clinic phone number is [phone].  Please show the CHEMO ALERT CARD at check-in to the Emergency Department and triage nurse.  Coronavirus (COVID-19) Are you at risk?  Are you at risk for the Coronavirus (COVID-19)?  To be considered HIGH RISK for Coronavirus (COVID-19), you have to meet the following criteria:  . Traveled to [place], [place], [place], [place] or [place]; or in the [place] to [place], [place], [place], or [place]; and have fever, cough, and shortness of breath within the last 2 weeks of travel OR . Been in close contact with a person diagnosed with COVID-19 within the last 2 weeks and have fever, cough, and shortness of breath . IF YOU DO NOT MEET THESE CRITERIA, YOU ARE CONSIDERED LOW RISK FOR COVID-19.  What to do if you are HIGH RISK for COVID-19?  . If you are having a medical emergency, call 911. . Seek medical care right away. Before you go to a doctor's office, urgent care or emergency department, call ahead and tell them about  your recent travel, contact with someone diagnosed with COVID-19, and your symptoms. You should receive instructions from your physician's office regarding next steps of care.  . When you arrive at healthcare provider, tell the healthcare staff immediately you have returned from visiting [place], [place], [place], [place] or [place]; or traveled in the [place] to [place], [place], [place], or [place]; in the last two weeks or you have been in close contact with a person diagnosed with COVID-19 in the last 2 weeks.   . Tell the health care staff about your symptoms: fever, cough and shortness of breath. . After you have been seen by a medical provider, you will be either: o Tested for (COVID-19) and discharged home on quarantine except to seek medical care if symptoms worsen, and asked to  - Stay home and avoid contact with others until you get your results (4-5 days)  - Avoid travel on public transportation if possible (such as bus, train, or airplane) or o Sent to the Emergency Department by EMS for evaluation, COVID-19 testing, and possible admission depending on your condition and test results.  What to do if you are LOW RISK for COVID-19?  Reduce your risk of any infection by using the same precautions used for avoiding the common cold or flu:  . Wash your hands often with soap and warm water for at least 20 seconds.  If soap and water are not readily available, use an alcohol-based hand sanitizer with at least 60% alcohol.  . If coughing   or sneezing, cover your mouth and nose by coughing or sneezing into the elbow areas of your shirt or coat, into a tissue or into your sleeve (not your hands). . Avoid shaking hands with others and consider head nods or verbal greetings only. . Avoid touching your eyes, nose, or mouth with unwashed hands.  . Avoid close contact with people who are sick. . Avoid places or events with large numbers of people in one location, like concerts or sporting  events. . Carefully consider travel plans you have or are making. . If you are planning any travel outside or inside the [place], visit the CDC's Travelers' Health webpage for the latest health notices. . If you have some symptoms but not all symptoms, continue to monitor at home and seek medical attention if your symptoms worsen. . If you are having a medical emergency, call 911.   ADDITIONAL HEALTHCARE OPTIONS FOR PATIENTS  [facility] / e-Visit: [email or web address]         [facility]: [phone]  [facility]: [phone]                   [facility]: [phone]   

## 2019-04-01 ENCOUNTER — Ambulatory Visit: Payer: Medicaid Other | Admitting: Hematology and Oncology

## 2019-04-01 ENCOUNTER — Ambulatory Visit: Payer: Medicaid Other

## 2019-04-01 ENCOUNTER — Other Ambulatory Visit: Payer: Medicaid Other

## 2019-04-01 NOTE — Progress Notes (Signed)
Patient Care Team: [name], MD as PCP - General (Family Medicine) [name], RN as Oncology Nurse Navigator [name], RN as Oncology Nurse Navigator [name], MD as Consulting Physician (General Surgery) [name], MD as Consulting Physician (Hematology and Oncology) [name], MD as Consulting Physician (Radiation Oncology) [name], RN as Registered Nurse (Medical Oncology)  DIAGNOSIS:    ICD-10-CM   1. Malignant neoplasm of lower-inner quadrant of right breast of female, estrogen receptor negative ([place])  C50.311    Z17.1     SUMMARY OF ONCOLOGIC HISTORY: Oncology History  Malignant neoplasm of lower-inner quadrant of right breast of female, estrogen receptor negative ([place])  12/12/2018 Initial Diagnosis   Patient palpated right breast mass. Mammogram showed a 3.3cm solid and cystic right breast mass, 6 o'clock position, no abnormal right axillary lymph nodes. 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    12/17/2018 Cancer Staging   Staging form: Breast, AJCC 8th Edition - Clinical stage from 12/17/2018: Stage IIB (cT2, cN0, cM0, G3, ER-, PR-, HER2-) - Signed by [name], MD on 12/17/2018   12/23/2018 Genetic Testing   Negative genetic testing:  No pathogenic variants detected on the [facility] Multi-Cancer panel. A variant of uncertain significance was detected in the BARD1 gene called c.26_40dup. The report date is 12/23/2018.  The Multi-Cancer Panel offered by [facility] includes sequencing and/or deletion duplication testing of the following 85 genes: AIP, ALK, APC, ATM, AXIN2,BAP1,  BARD1, BLM, BMPR1A, BRCA1, BRCA2, BRIP1, CASR, CDC73, CDH1, CDK4, CDKN1B, CDKN1C, CDKN2A (p14ARF), CDKN2A (p16INK4a), CEBPA, CHEK2, CTNNA1, DICER1, DIS3L2, EGFR (c.2369C>T, p.Thr790Met variant only), EPCAM (Deletion/duplication testing only), FH, FLCN, GATA2, GPC3, GREM1 (Promoter region  deletion/duplication testing only), HOXB13 (c.251G>A, p.Gly84Glu), HRAS, KIT, MAX, MEN1, MET, MITF (c.952G>A, p.Glu318Lys variant only), MLH1, MSH2, MSH3, MSH6, MUTYH, NBN, NF1, NF2, NTHL1, PALB2, PDGFRA, PHOX2B, PMS2, POLD1, POLE, POT1, PRKAR1A, PTCH1, PTEN, RAD50, RAD51C, RAD51D, RB1, RECQL4, RET, RNF43, RUNX1, SDHAF2, SDHA (sequence changes only), SDHB, SDHC, SDHD, SMAD4, SMARCA4, SMARCB1, SMARCE1, STK11, SUFU, TERC, TERT, TMEM127, TP53, TSC1, TSC2, VHL, WRN and WT1.     12/30/2018 -  Chemotherapy   The patient had DOXOrubicin (ADRIAMYCIN) chemo injection 102 mg, 60 mg/m2 = 102 mg, Intravenous,  Once, 4 of 4 cycles Dose modification: 50 mg/m2 (original dose 60 mg/m2, Cycle 3, Reason: Dose not tolerated) Administration: 102 mg (12/30/2018), 102 mg (01/15/2019), 86 mg (01/28/2019), 86 mg (02/11/2019) palonosetron (ALOXI) injection 0.25 mg, 0.25 mg, Intravenous,  Once, 6 of 8 cycles Administration: 0.25 mg (12/30/2018), 0.25 mg (02/26/2019), 0.25 mg (01/15/2019), 0.25 mg (01/28/2019), 0.25 mg (02/11/2019), 0.25 mg (03/18/2019) ondansetron (ZOFRAN) injection 8 mg, 8 mg (100 % of original dose 8 mg), Intravenous,  Once, 2 of 4 cycles Dose modification: 8 mg (original dose 8 mg, Cycle 5) Administration: 8 mg (03/05/2019), 8 mg (03/12/2019), 8 mg (03/26/2019) pegfilgrastim-cbqv (UDENYCA) injection 6 mg, 6 mg, Subcutaneous, Once, 4 of 4 cycles Administration: 6 mg (01/01/2019), 6 mg (01/17/2019), 6 mg (01/30/2019), 6 mg (02/13/2019) CARBOplatin (PARAPLATIN) 600 mg in sodium chloride 0.9 % 250 mL chemo infusion, 600 mg (120.7 % of original dose 500.4 mg), Intravenous,  Once, 2 of 4 cycles Dose modification:   (original dose 500.4 mg, Cycle 5) Administration: 600 mg (02/26/2019), 600 mg (03/18/2019) cyclophosphamide (CYTOXAN) 1,020 mg in sodium chloride 0.9 % 250 mL chemo infusion, 600 mg/m2 = 1,020 mg, Intravenous,  Once, 4 of 4 cycles Dose modification: 500 mg/m2 (original dose  600 mg/m2, Cycle 3, Reason: Dose not  tolerated) Administration: 1,020 mg (12/30/2018), 1,020 mg (01/15/2019), 860 mg (01/28/2019), 860 mg (02/11/2019) PACLitaxel (TAXOL) 138 mg in sodium chloride 0.9 % 250 mL chemo infusion (</= 54m/m2), 80 mg/m2 = 138 mg, Intravenous,  Once, 2 of 4 cycles Administration: 138 mg (02/26/2019), 138 mg (03/05/2019), 138 mg (03/12/2019), 138 mg (03/18/2019), 138 mg (03/26/2019) fosaprepitant (EMEND) 150 mg, dexamethasone (DECADRON) 12 mg in sodium chloride 0.9 % 145 mL IVPB, , Intravenous,  Once, 6 of 8 cycles Administration:  (12/30/2018),  (02/26/2019),  (01/15/2019),  (01/28/2019),  (02/11/2019),  (03/18/2019)  for chemotherapy treatment.      CHIEF COMPLIANT: Cycle6Taxol  INTERVAL HISTORY: Kara TEEMis a 54y.o. 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 She is currently on neoadjuvant chemotherapy withweekly Taxol and Carboplatin every 3 weeks after completing 4 cycles of Adriamycin and Cytoxan. She is a participant in the UpBeat clinical trial.She presents to the clinic todayfora toxicity checkand cycle 6.  ALLERGIES:  has No Known Allergies.  MEDICATIONS:  Current Outpatient Medications  Medication Sig Dispense Refill  . acetaminophen (TYLENOL) 325 MG tablet Take by mouth.    .Marland Kitchenb complex vitamins capsule Take by mouth.    .Marland KitchenbuPROPion (WELLBUTRIN XL) 150 MG 24 hr tablet Take 1 tablet by mouth daily 90 tablet 3  . CYTOMEL 5 MCG tablet Take 1 tablet by mouth once daily 90 tablet 3  . lidocaine-prilocaine (EMLA) cream Apply to affected area once 30 g 3  . LORazepam (ATIVAN) 0.5 MG tablet Take 1 tablet (0.5 mg total) by mouth at bedtime as needed for sleep. 30 tablet 0  . Multiple Vitamin (MULTIVITAMIN) capsule Take by mouth.    . ondansetron (ZOFRAN-ODT) 8 MG disintegrating tablet Take 1 tablet (8 mg total) by mouth every 8 (eight) hours as needed for nausea or vomiting. 20 tablet 3  . pantoprazole (PROTONIX) 40 MG tablet Take 1 tablet (40 mg total) by mouth daily.  30 tablet 3  . prochlorperazine (COMPAZINE) 10 MG tablet TAKE 1 TABLET BY MOUTH EVERY 6 HOURS AS NEEDED FOR NAUSEA AND/OR VOMITING 30 tablet 1  . promethazine (PHENERGAN) 25 MG suppository Place 1 suppository (25 mg total) rectally every 6 (six) hours as needed for nausea or vomiting. 12 each 1  . SYNTHROID 75 MCG tablet Take 1 tablet by mouth once daily 90 tablet 3   No current facility-administered medications for this visit.    PHYSICAL EXAMINATION: ECOG PERFORMANCE STATUS: 1 - Symptomatic but completely ambulatory  Vitals:   04/02/19 1033  BP: 124/85  Pulse: (!) 103  Resp: 18  Temp: 98.2 F (36.8 C)  SpO2: 100%   Filed Weights   04/02/19 1033  Weight: 155 lb 12.8 oz (70.7 kg)    LABORATORY DATA:  I have reviewed the data as listed CMP Latest Ref Rng & Units 03/26/2019 03/18/2019 03/12/2019  Glucose 70 - 99 mg/dL 87 87 99  BUN 6 - 20 mg/dL 18 15 21(H)  Creatinine 0.44 - 1.00 mg/dL 0.74 0.81 0.89  Sodium 135 - 145 mmol/L 140 144 140  Potassium 3.5 - 5.1 mmol/L 4.1 4.1 4.3  Chloride 98 - 111 mmol/L 108 109 105  CO2 22 - 32 mmol/L _0 Calcium 8.9 - 10.3 mg/dL 8.9 8.9 9.6  Total Protein 6.5 - 8.1 g/dL 6.7 7.1 6.9  Total Bilirubin 0.3 - 1.2 mg/dL 0.2(L) 0.3 0.3  Alkaline Phos 38 - 126 U/L 66 73 65  AST 15 - 41 U/L _0 ALT 0 - 44 U/L 31 49(H) 54(H)    Lab Results  Component Value Date   WBC 3.2 (L) 03/26/2019   HGB 10.5 (L) 03/26/2019   HCT 30.5 (L) 03/26/2019   MCV 98.1 03/26/2019   PLT 185 03/26/2019   NEUTROABS 1.9 03/26/2019    ASSESSMENT & PLAN:  Malignant neoplasm of lower-inner quadrant of right breast of female, estrogen receptor negative ([place]) 12/12/2018:Patient palpated right breast mass. Mammogram showed a 3.3cm solid and cystic right breast mass, 6 o'clock position, no abnormal right axillary lymph nodes. 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 T2 N0 stage IIb clinical stage  Treatment  plan: 1.Neoadjuvant chemotherapy with dose dense Adriamycin and Cytoxan x4 followed by Taxol weekly x12 2.Breast conserving surgery with sentinel lymph node biopsy 3.Adjuvant radiation Upbeat clinical trial: No adverse effects due to participation in the study ----------------------------------------------------------------------------------------------------------------------------------------------------- Current treatment:Completed 4 cycles ofdose dense Adriamycin and Cytoxan, today is cycle6Taxol(carboplatinevery 3 weeks) Echocardiogram: 12/22/2018: EF 60 to 65%  Chemo toxicities: 1.Nausea: improved. 2.Acid reflux:onProtonix 3.Alopecia 4. Chemo induced anemia: Hbtoday is10.5 5.  ANC: 0.9: We will not be able to do today's chemotherapy.  We will reduce the dosage of her treatment and will see her back next week. I will request authorization for Granix to be given as needed if her counts drop again.  Return to clinic weekly for Taxol and follow-up with me every other week.  MRI Breast 03/25/19: Done because patient was complaining of increase in breast mass. Enlarging fluid collection 5.9 cm. Overall thickness and nodularity decreased. Second fluid collection 2.9 cm. Dec in NME surrounding the mass. Fluid abuts the pectoral muscle but the muscle does not have enhancement.   Return to clinic in 1 week to see if he can do cycle 6 of Taxol.   No orders of the defined types were placed in this encounter.  The patient has a good understanding of the overall plan. she agrees with it. she will call with any problems that may develop before the next visit here.  Total time spent: 30 mins including face to face time and time spent for planning, charting and coordination of care  [name], MD 04/02/2019  I, Cloyde Reams Dorshimer, am acting as scribe for Dr. [name].  I have reviewed the above documentation for accuracy and completeness, and I agree with the  above.

## 2019-04-02 ENCOUNTER — Other Ambulatory Visit: Payer: Self-pay

## 2019-04-02 ENCOUNTER — Inpatient Hospital Stay (HOSPITAL_BASED_OUTPATIENT_CLINIC_OR_DEPARTMENT_OTHER): Payer: Medicaid Other | Admitting: Hematology and Oncology

## 2019-04-02 ENCOUNTER — Inpatient Hospital Stay: Payer: Medicaid Other

## 2019-04-02 DIAGNOSIS — Z171 Estrogen receptor negative status [ER-]: Secondary | ICD-10-CM

## 2019-04-02 DIAGNOSIS — C50311 Malignant neoplasm of lower-inner quadrant of right female breast: Secondary | ICD-10-CM

## 2019-04-02 DIAGNOSIS — Z5111 Encounter for antineoplastic chemotherapy: Secondary | ICD-10-CM | POA: Diagnosis not present

## 2019-04-02 DIAGNOSIS — Z95828 Presence of other vascular implants and grafts: Secondary | ICD-10-CM

## 2019-04-02 LAB — CBC WITH DIFFERENTIAL (CANCER CENTER ONLY)
Abs Immature Granulocytes: 0.01 10*3/uL (ref 0.00–0.07)
Basophils Absolute: 0 10*3/uL (ref 0.0–0.1)
Basophils Relative: 1 %
Eosinophils Absolute: 0 10*3/uL (ref 0.0–0.5)
Eosinophils Relative: 1 %
HCT: 33 % — ABNORMAL LOW (ref 36.0–46.0)
Hemoglobin: 11 g/dL — ABNORMAL LOW (ref 12.0–15.0)
Immature Granulocytes: 1 %
Lymphocytes Relative: 45 %
Lymphs Abs: 1 10*3/uL (ref 0.7–4.0)
MCH: 33 pg (ref 26.0–34.0)
MCHC: 33.3 g/dL (ref 30.0–36.0)
MCV: 99.1 fL (ref 80.0–100.0)
Monocytes Absolute: 0.3 10*3/uL (ref 0.1–1.0)
Monocytes Relative: 13 %
Neutro Abs: 0.9 10*3/uL — ABNORMAL LOW (ref 1.7–7.7)
Neutrophils Relative %: 39 %
Platelet Count: 326 10*3/uL (ref 150–400)
RBC: 3.33 MIL/uL — ABNORMAL LOW (ref 3.87–5.11)
RDW: 16 % — ABNORMAL HIGH (ref 11.5–15.5)
WBC Count: 2.2 10*3/uL — ABNORMAL LOW (ref 4.0–10.5)
nRBC: 0 % (ref 0.0–0.2)

## 2019-04-02 LAB — CMP (CANCER CENTER ONLY)
ALT: 48 U/L — ABNORMAL HIGH (ref 0–44)
AST: 25 U/L (ref 15–41)
Albumin: 3.9 g/dL (ref 3.5–5.0)
Alkaline Phosphatase: 79 U/L (ref 38–126)
Anion gap: 10 (ref 5–15)
BUN: 11 mg/dL (ref 6–20)
CO2: 26 mmol/L (ref 22–32)
Calcium: 9.4 mg/dL (ref 8.9–10.3)
Chloride: 108 mmol/L (ref 98–111)
Creatinine: 0.86 mg/dL (ref 0.44–1.00)
GFR, Est AFR Am: >60 mL/min (ref >60)
GFR, Estimated: >60 mL/min (ref >60)
Glucose, Bld: 93 mg/dL (ref 70–99)
Potassium: 4.3 mmol/L (ref 3.5–5.1)
Sodium: 144 mmol/L (ref 135–145)
Total Bilirubin: 0.3 mg/dL (ref 0.3–1.2)
Total Protein: 7 g/dL (ref 6.5–8.1)

## 2019-04-02 MED ORDER — SODIUM CHLORIDE 0.9% FLUSH
10.0000 mL | Freq: Once | INTRAVENOUS | Status: AC
  Administered 2019-04-02: 10 mL
  Filled 2019-04-02: qty 10

## 2019-04-02 MED FILL — PANTOPRAZOLE SOD DR 40 MG T: 40 | 30 days supply | Qty: 30 | Fill #2

## 2019-04-02 MED FILL — PROCHLORPERAZINE 10 MG TAB: 10 | 7 days supply | Qty: 30 | Fill #1

## 2019-04-02 MED FILL — CYTOMEL 5 MCG TABLET: 5 | 30 days supply | Qty: 30 | Fill #2

## 2019-04-02 MED FILL — LIDOCAINE-PRILOCAINE CREAM: 2.5-2.5 | 10 days supply | Qty: 30 | Fill #1

## 2019-04-02 NOTE — Assessment & Plan Note (Signed)
12/12/2018:Patient palpated right breast mass. Mammogram showed a 3.3cm solid and cystic right breast mass, 6 o'clock position, no abnormal right axillary lymph nodes. 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Neoadjuvant chemotherapy with dose dense Adriamycin and Cytoxan x4 followed by Taxol weekly x12 2.Breast conserving surgery with sentinel lymph node biopsy 3.Adjuvant radiation Upbeat clinical trial: No adverse effects due to participation in the study ----------------------------------------------------------------------------------------------------------------------------------------------------- Current treatment:Completed 4 cycles ofdose dense Adriamycin and Cytoxan, today is cycle6Taxol(carboplatinevery 3 weeks) Echocardiogram: 12/22/2018: EF 60 to 65%  Chemo toxicities: 1.Nausea: improved. 2.Acid reflux:onProtonix 3.Alopecia 4. Chemo induced anemia: Hbtoday is  Return to clinic weekly for Taxol and follow-up with me every other week.  MRI Breast 03/25/19: Done because patient was complaining of increase in breast mass. Enlarging fluid collection 5.9 cm. Overall thickness and nodularity decreased. Second fluid collection 2.9 cm. Dec in NME surrounding the mass. Fluid abuts the pectoral muscle but the muscle does not have enhancement.

## 2019-04-08 ENCOUNTER — Other Ambulatory Visit: Payer: Medicaid Other

## 2019-04-08 ENCOUNTER — Ambulatory Visit: Payer: Medicaid Other

## 2019-04-08 NOTE — Progress Notes (Signed)
Patient Care Team: [name], MD as PCP - General (Family Medicine) [name], RN as Oncology Nurse Navigator [name], RN as Oncology Nurse Navigator [name], MD as Consulting Physician (General Surgery) [name], MD as Consulting Physician (Hematology and Oncology) [name], MD as Consulting Physician (Radiation Oncology) [name], RN as Registered Nurse (Medical Oncology)  DIAGNOSIS:    ICD-10-CM   1. Malignant neoplasm of lower-inner quadrant of right breast of female, estrogen receptor negative ([place])  C50.311    Z17.1     SUMMARY OF ONCOLOGIC HISTORY: Oncology History  Malignant neoplasm of lower-inner quadrant of right breast of female, estrogen receptor negative ([place])  12/12/2018 Initial Diagnosis   Patient palpated right breast mass. Mammogram showed a 3.3cm solid and cystic right breast mass, 6 o'clock position, no abnormal right axillary lymph nodes. 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    12/17/2018 Cancer Staging   Staging form: Breast, AJCC 8th Edition - Clinical stage from 12/17/2018: Stage IIB (cT2, cN0, cM0, G3, ER-, PR-, HER2-) - Signed by [name], MD on 12/17/2018   12/23/2018 Genetic Testing   Negative genetic testing:  No pathogenic variants detected on the [facility] Multi-Cancer panel. A variant of uncertain significance was detected in the BARD1 gene called c.26_40dup. The report date is 12/23/2018.  The Multi-Cancer Panel offered by [facility] includes sequencing and/or deletion duplication testing of the following 85 genes: AIP, ALK, APC, ATM, AXIN2,BAP1,  BARD1, BLM, BMPR1A, BRCA1, BRCA2, BRIP1, CASR, CDC73, CDH1, CDK4, CDKN1B, CDKN1C, CDKN2A (p14ARF), CDKN2A (p16INK4a), CEBPA, CHEK2, CTNNA1, DICER1, DIS3L2, EGFR (c.2369C>T, p.Thr790Met variant only), EPCAM (Deletion/duplication testing only), FH, FLCN, GATA2, GPC3, GREM1 (Promoter region  deletion/duplication testing only), HOXB13 (c.251G>A, p.Gly84Glu), HRAS, KIT, MAX, MEN1, MET, MITF (c.952G>A, p.Glu318Lys variant only), MLH1, MSH2, MSH3, MSH6, MUTYH, NBN, NF1, NF2, NTHL1, PALB2, PDGFRA, PHOX2B, PMS2, POLD1, POLE, POT1, PRKAR1A, PTCH1, PTEN, RAD50, RAD51C, RAD51D, RB1, RECQL4, RET, RNF43, RUNX1, SDHAF2, SDHA (sequence changes only), SDHB, SDHC, SDHD, SMAD4, SMARCA4, SMARCB1, SMARCE1, STK11, SUFU, TERC, TERT, TMEM127, TP53, TSC1, TSC2, VHL, WRN and WT1.     12/30/2018 -  Chemotherapy   The patient had DOXOrubicin (ADRIAMYCIN) chemo injection 102 mg, 60 mg/m2 = 102 mg, Intravenous,  Once, 4 of 4 cycles Dose modification: 50 mg/m2 (original dose 60 mg/m2, Cycle 3, Reason: Dose not tolerated) Administration: 102 mg (12/30/2018), 102 mg (01/15/2019), 86 mg (01/28/2019), 86 mg (02/11/2019) palonosetron (ALOXI) injection 0.25 mg, 0.25 mg, Intravenous,  Once, 6 of 8 cycles Administration: 0.25 mg (12/30/2018), 0.25 mg (02/26/2019), 0.25 mg (01/15/2019), 0.25 mg (01/28/2019), 0.25 mg (02/11/2019), 0.25 mg (03/18/2019) ondansetron (ZOFRAN) injection 8 mg, 8 mg (100 % of original dose 8 mg), Intravenous,  Once, 2 of 4 cycles Dose modification: 8 mg (original dose 8 mg, Cycle 5) Administration: 8 mg (03/05/2019), 8 mg (03/12/2019), 8 mg (03/26/2019) pegfilgrastim-cbqv (UDENYCA) injection 6 mg, 6 mg, Subcutaneous, Once, 4 of 4 cycles Administration: 6 mg (01/01/2019), 6 mg (01/17/2019), 6 mg (01/30/2019), 6 mg (02/13/2019) CARBOplatin (PARAPLATIN) 600 mg in sodium chloride 0.9 % 250 mL chemo infusion, 600 mg (120.7 % of original dose 500.4 mg), Intravenous,  Once, 2 of 4 cycles Dose modification:   (original dose 500.4 mg, Cycle 5) Administration: 600 mg (02/26/2019), 600 mg (03/18/2019) cyclophosphamide (CYTOXAN) 1,020 mg in sodium chloride 0.9 % 250 mL chemo infusion, 600 mg/m2 = 1,020 mg, Intravenous,  Once, 4 of 4 cycles Dose modification: 500 mg/m2 (original dose  600 mg/m2, Cycle 3, Reason: Dose not  tolerated) Administration: 1,020 mg (12/30/2018), 1,020 mg (01/15/2019), 860 mg (01/28/2019), 860 mg (02/11/2019) PACLitaxel (TAXOL) 138 mg in sodium chloride 0.9 % 250 mL chemo infusion (</= 30m/m2), 80 mg/m2 = 138 mg, Intravenous,  Once, 2 of 4 cycles Dose modification: 65 mg/m2 (original dose 80 mg/m2, Cycle 6, Reason: Dose not tolerated) Administration: 138 mg (02/26/2019), 138 mg (03/05/2019), 138 mg (03/12/2019), 138 mg (03/18/2019), 138 mg (03/26/2019) fosaprepitant (EMEND) 150 mg, dexamethasone (DECADRON) 12 mg in sodium chloride 0.9 % 145 mL IVPB, , Intravenous,  Once, 6 of 8 cycles Administration:  (12/30/2018),  (02/26/2019),  (01/15/2019),  (01/28/2019),  (02/11/2019),  (03/18/2019)  for chemotherapy treatment.      CHIEF COMPLIANT: Cycle7Taxol(chemo being discontinued)  INTERVAL HISTORY: Kara DEPPis a 54y.o. 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 She is currently on neoadjuvant chemotherapy withweekly Taxoland Carboplatin every 3 weeksafter completing 4 cycles of Adriamycin and Cytoxan. She is a participant in the UpBeat clinical trial.She presents to the clinic todayfora toxicity checkand cycle 7.  She is complaining of worsening right breast pain with pustular lesion on the breast.  The skin over the breast appears to be quite tense.  She is uncomfortable.  She is able to manage her pain but she thinks that over the past week her symptoms have gotten worse.  ALLERGIES:  has No Known Allergies.  MEDICATIONS:  Current Outpatient Medications  Medication Sig Dispense Refill  . acetaminophen (TYLENOL) 325 MG tablet Take by mouth.    .Marland Kitchenamoxicillin-clavulanate (AUGMENTIN) 875-125 MG tablet Take 1 tablet by mouth 2 (two) times daily for 14 days. 28 tablet 0  . b complex vitamins capsule Take by mouth.    .Marland KitchenbuPROPion (WELLBUTRIN XL) 150 MG 24 hr tablet Take 1 tablet by mouth daily 90 tablet 3  . clindamycin (CLINDAGEL) 1 % gel Apply topically 2 (two)  times daily. 30 g 0  . CYTOMEL 5 MCG tablet Take 1 tablet by mouth once daily 90 tablet 3  . lidocaine-prilocaine (EMLA) cream Apply to affected area once 30 g 3  . LORazepam (ATIVAN) 0.5 MG tablet Take 1 tablet (0.5 mg total) by mouth at bedtime as needed for sleep. 30 tablet 0  . Multiple Vitamin (MULTIVITAMIN) capsule Take by mouth.    . ondansetron (ZOFRAN-ODT) 8 MG disintegrating tablet Take 1 tablet (8 mg total) by mouth every 8 (eight) hours as needed for nausea or vomiting. 20 tablet 3  . pantoprazole (PROTONIX) 40 MG tablet Take 1 tablet (40 mg total) by mouth daily. 30 tablet 3  . prochlorperazine (COMPAZINE) 10 MG tablet TAKE 1 TABLET BY MOUTH EVERY 6 HOURS AS NEEDED FOR NAUSEA AND/OR VOMITING 30 tablet 1  . promethazine (PHENERGAN) 25 MG suppository Place 1 suppository (25 mg total) rectally every 6 (six) hours as needed for nausea or vomiting. 12 each 1  . SYNTHROID 75 MCG tablet Take 1 tablet by mouth once daily 90 tablet 3   No current facility-administered medications for this visit.    PHYSICAL EXAMINATION: ECOG PERFORMANCE STATUS: 1 - Symptomatic but completely ambulatory  Vitals:   04/09/19 1153  BP: (!) 126/92  Pulse: (!) 103  Resp: 20  Temp: 98.5 F (36.9 C)  SpO2: 100%   Filed Weights   04/09/19 1153  Weight: 155 lb 9.6 oz (70.6 kg)    LABORATORY DATA:  I have reviewed the data as listed CMP Latest Ref Rng & Units 04/09/2019  04/02/2019 03/26/2019  Glucose 70 - 99 mg/dL 95 93 87  BUN 6 - 20 mg/dL _0 Creatinine 0.44 - 1.00 mg/dL 0.75 0.86 0.74  Sodium 135 - 145 mmol/L 141 144 140  Potassium 3.5 - 5.1 mmol/L 4.1 4.3 4.1  Chloride 98 - 111 mmol/L 108 108 108  CO2 22 - 32 mmol/L _1 Calcium 8.9 - 10.3 mg/dL 9.1 9.4 8.9  Total Protein 6.5 - 8.1 g/dL 7.1 7.0 6.7  Total Bilirubin 0.3 - 1.2 mg/dL <0.2(L) 0.3 0.2(L)  Alkaline Phos 38 - 126 U/L 83 79 66  AST 15 - 41 U/L _2 ALT 0 - 44 U/L 23 48(H) 31    Lab Results  Component Value Date    WBC 3.3 (L) 04/09/2019   HGB 11.3 (L) 04/09/2019   HCT 34.6 (L) 04/09/2019   MCV 100.3 (H) 04/09/2019   PLT 218 04/09/2019   NEUTROABS 1.7 04/09/2019    ASSESSMENT & PLAN:  Malignant neoplasm of lower-inner quadrant of right breast of female, estrogen receptor negative ([place]) 12/12/2018:Patient palpated right breast mass. Mammogram showed a 3.3cm solid and cystic right breast mass, 6 o'clock position, no abnormal right axillary lymph nodes. 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Neoadjuvant chemotherapy with dose dense Adriamycin and Cytoxan x4 followed by Taxol weekly x6 discontinued because of worsening breast symptoms 2.Mastectomy with sentinel lymph node biopsy 3.Plus or minus adjuvant radiation Upbeat clinical trial: No adverse effects due to participation in the study ----------------------------------------------------------------------------------------------------------------------------------------------------- Current treatment:Completed 4 cycles ofdose dense Adriamycin and Cytoxan, today is cycle6Taxol(carboplatinevery 3 weeks) Echocardiogram: 12/22/2018: EF 60 to 65%  Chemo toxicities: 1.Nausea:improved. 2.Acid reflux:onProtonix 3.Alopecia 4. Chemo induced anemia: Hbtoday is10.5 5.    Neutropenia: We held the last week's chemotherapy.  Today's ANC is 1.7.     Breast discomfort: Patient skin over the breast appears to have tightened up and there is a pustular lesion in the breast as well.  There is no warmth.  There is slight tenderness. I discussed with her about discontinuing further chemotherapy and proceeding with surgery. I called and discussed the case with Dr. Barry Dienes who is willing to see the patient early next week. I will start her on Augmentin.  Acneform rash on the face: Clindamycin gel was prescribed.  MRI Breast 03/25/19: Enlarging  fluid collection 5.9 cm. Overall thickness and nodularity decreased. Second fluid collection 2.9 cm. Dec in NME surrounding the mass. Fluid abuts the pectoral muscle but the muscle does not have enhancement.   I would like her to keep the port because we may consider her for immunotherapy clinical trial. Return to clinic after surgery to discuss pathology report.   No orders of the defined types were placed in this encounter.  The patient has a good understanding of the overall plan. she agrees with it. she will call with any problems that may develop before the next visit here.  Total time spent: 30 mins including face to face time and time spent for planning, charting and coordination of care  [name], MD 04/09/2019  I, Cloyde Reams Dorshimer, am acting as scribe for Dr. [name].  I have reviewed the above documentation for accuracy and completeness, and I agree with the above.

## 2019-04-09 ENCOUNTER — Inpatient Hospital Stay: Payer: Medicaid Other | Attending: Hematology and Oncology

## 2019-04-09 ENCOUNTER — Inpatient Hospital Stay (HOSPITAL_BASED_OUTPATIENT_CLINIC_OR_DEPARTMENT_OTHER): Payer: Medicaid Other | Admitting: Hematology and Oncology

## 2019-04-09 ENCOUNTER — Ambulatory Visit: Payer: Medicaid Other | Admitting: Hematology and Oncology

## 2019-04-09 ENCOUNTER — Inpatient Hospital Stay: Payer: Medicaid Other

## 2019-04-09 ENCOUNTER — Other Ambulatory Visit: Payer: Self-pay

## 2019-04-09 ENCOUNTER — Other Ambulatory Visit: Payer: Self-pay | Admitting: *Deleted

## 2019-04-09 DIAGNOSIS — D6481 Anemia due to antineoplastic chemotherapy: Secondary | ICD-10-CM | POA: Diagnosis not present

## 2019-04-09 DIAGNOSIS — C50311 Malignant neoplasm of lower-inner quadrant of right female breast: Secondary | ICD-10-CM

## 2019-04-09 DIAGNOSIS — D701 Agranulocytosis secondary to cancer chemotherapy: Secondary | ICD-10-CM | POA: Insufficient documentation

## 2019-04-09 DIAGNOSIS — R110 Nausea: Secondary | ICD-10-CM | POA: Diagnosis not present

## 2019-04-09 DIAGNOSIS — Z171 Estrogen receptor negative status [ER-]: Secondary | ICD-10-CM

## 2019-04-09 DIAGNOSIS — K219 Gastro-esophageal reflux disease without esophagitis: Secondary | ICD-10-CM | POA: Diagnosis not present

## 2019-04-09 DIAGNOSIS — T451X5A Adverse effect of antineoplastic and immunosuppressive drugs, initial encounter: Secondary | ICD-10-CM | POA: Insufficient documentation

## 2019-04-09 DIAGNOSIS — Z9221 Personal history of antineoplastic chemotherapy: Secondary | ICD-10-CM | POA: Insufficient documentation

## 2019-04-09 DIAGNOSIS — Z79899 Other long term (current) drug therapy: Secondary | ICD-10-CM | POA: Diagnosis not present

## 2019-04-09 DIAGNOSIS — Z9011 Acquired absence of right breast and nipple: Secondary | ICD-10-CM | POA: Insufficient documentation

## 2019-04-09 DIAGNOSIS — Z452 Encounter for adjustment and management of vascular access device: Secondary | ICD-10-CM | POA: Diagnosis not present

## 2019-04-09 DIAGNOSIS — Z95828 Presence of other vascular implants and grafts: Secondary | ICD-10-CM

## 2019-04-09 LAB — CBC WITH DIFFERENTIAL (CANCER CENTER ONLY)
Abs Immature Granulocytes: 0.01 10*3/uL (ref 0.00–0.07)
Basophils Absolute: 0 10*3/uL (ref 0.0–0.1)
Basophils Relative: 1 %
Eosinophils Absolute: 0 10*3/uL (ref 0.0–0.5)
Eosinophils Relative: 1 %
HCT: 34.6 % — ABNORMAL LOW (ref 36.0–46.0)
Hemoglobin: 11.3 g/dL — ABNORMAL LOW (ref 12.0–15.0)
Immature Granulocytes: 0 %
Lymphocytes Relative: 31 %
Lymphs Abs: 1 10*3/uL (ref 0.7–4.0)
MCH: 32.8 pg (ref 26.0–34.0)
MCHC: 32.7 g/dL (ref 30.0–36.0)
MCV: 100.3 fL — ABNORMAL HIGH (ref 80.0–100.0)
Monocytes Absolute: 0.5 10*3/uL (ref 0.1–1.0)
Monocytes Relative: 15 %
Neutro Abs: 1.7 10*3/uL (ref 1.7–7.7)
Neutrophils Relative %: 52 %
Platelet Count: 218 10*3/uL (ref 150–400)
RBC: 3.45 MIL/uL — ABNORMAL LOW (ref 3.87–5.11)
RDW: 15.1 % (ref 11.5–15.5)
WBC Count: 3.3 10*3/uL — ABNORMAL LOW (ref 4.0–10.5)
nRBC: 0 % (ref 0.0–0.2)

## 2019-04-09 LAB — CMP (CANCER CENTER ONLY)
ALT: 23 U/L (ref 0–44)
AST: 18 U/L (ref 15–41)
Albumin: 3.8 g/dL (ref 3.5–5.0)
Alkaline Phosphatase: 83 U/L (ref 38–126)
Anion gap: 6 (ref 5–15)
BUN: 16 mg/dL (ref 6–20)
CO2: 27 mmol/L (ref 22–32)
Calcium: 9.1 mg/dL (ref 8.9–10.3)
Chloride: 108 mmol/L (ref 98–111)
Creatinine: 0.75 mg/dL (ref 0.44–1.00)
GFR, Est AFR Am: >60 mL/min (ref >60)
GFR, Estimated: >60 mL/min (ref >60)
Glucose, Bld: 95 mg/dL (ref 70–99)
Potassium: 4.1 mmol/L (ref 3.5–5.1)
Sodium: 141 mmol/L (ref 135–145)
Total Bilirubin: <0.2 mg/dL — ABNORMAL LOW (ref 0.3–1.2)
Total Protein: 7.1 g/dL (ref 6.5–8.1)

## 2019-04-09 MED ORDER — SODIUM CHLORIDE 0.9% FLUSH
10.0000 mL | Freq: Once | INTRAVENOUS | Status: AC
  Administered 2019-04-09: 10 mL
  Filled 2019-04-09: qty 10

## 2019-04-09 MED ORDER — CLINDAMYCIN PHOSPHATE 1 % EX GEL: Freq: Two times a day (BID) | CUTANEOUS | 0 refills | Status: DC

## 2019-04-09 MED ORDER — AMOXICILLIN-POT CLAVULANATE 875-125 MG PO TABS: 1.0000 | ORAL_TABLET | Freq: Two times a day (BID) | ORAL | 0 refills | Status: AC

## 2019-04-09 MED ORDER — BIMATOPROST 0.03 % EX SOLN: 0.0300 mL | Freq: Every day | CUTANEOUS | 3 refills | Status: DC

## 2019-04-09 MED FILL — AMOX-CLAV 875-125 MG TABLET: 875-125 | 14 days supply | Qty: 28 | Fill #0

## 2019-04-09 MED FILL — BIMATOPROST 0.03% EYELASH S: 0.03 | 50 days supply | Qty: 5 | Fill #0

## 2019-04-09 NOTE — Assessment & Plan Note (Signed)
12/12/2018:Patient palpated right breast mass. Mammogram showed a 3.3cm solid and cystic right breast mass, 6 o'clock position, no abnormal right axillary lymph nodes. 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Neoadjuvant chemotherapy with dose dense Adriamycin and Cytoxan x4 followed by Taxol weekly x12 2.Breast conserving surgery with sentinel lymph node biopsy 3.Adjuvant radiation Upbeat clinical trial: No adverse effects due to participation in the study ----------------------------------------------------------------------------------------------------------------------------------------------------- Current treatment:Completed 4 cycles ofdose dense Adriamycin and Cytoxan, today is cycle6Taxol(carboplatinevery 3 weeks) Echocardiogram: 12/22/2018: EF 60 to 65%  Chemo toxicities: 1.Nausea:improved. 2.Acid reflux:onProtonix 3.Alopecia 4. Chemo induced anemia: Hbtoday is10.5 5.    Neutropenia: We held the last week's chemotherapy.  Today's ANC is.  we reduce the dosage of chemo today. I will request authorization for Granix to be given as needed if her counts drop again.  MRI Breast 03/25/19: Done because patient was complaining of increase in breast mass. Enlarging fluid collection 5.9 cm. Overall thickness and nodularity decreased. Second fluid collection 2.9 cm. Dec in NME surrounding the mass. Fluid abuts the pectoral muscle but the muscle does not have enhancement.   Return to clinic weekly for Taxol and follow-up with me every other week.

## 2019-04-09 NOTE — Progress Notes (Signed)
Per MD request , RN to place script for [facility] for pt.  Script sent to pharmacy on file and pt notified.

## 2019-04-14 ENCOUNTER — Other Ambulatory Visit: Payer: Self-pay | Admitting: General Surgery

## 2019-04-14 ENCOUNTER — Telehealth: Payer: Self-pay | Admitting: *Deleted

## 2019-04-14 DIAGNOSIS — C50911 Malignant neoplasm of unspecified site of right female breast: Secondary | ICD-10-CM | POA: Diagnosis not present

## 2019-04-14 MED ORDER — DIFFERIN 0.1 % EX LOTN: 1.0000 | TOPICAL_LOTION | Freq: Two times a day (BID) | CUTANEOUS | 0 refills | Status: DC

## 2019-04-14 NOTE — Telephone Encounter (Signed)
Clindamycin Prior Authorization PA: [id number] Denied.  Has not tried and failed preferred agent.  Verbal order received and read back from Dr. Lindi Adie for Differin Cream/Gel pump/Lotion as noted with [place] Medicaid Preferred Drug List (PDL).  Order given to pharmacist Gaspar Bidding  at this time.

## 2019-04-15 ENCOUNTER — Ambulatory Visit: Payer: Medicaid Other | Admitting: Hematology and Oncology

## 2019-04-15 ENCOUNTER — Other Ambulatory Visit: Payer: Medicaid Other

## 2019-04-15 ENCOUNTER — Ambulatory Visit: Payer: Medicaid Other

## 2019-04-15 ENCOUNTER — Encounter: Payer: Self-pay | Admitting: *Deleted

## 2019-04-15 ENCOUNTER — Telehealth: Payer: Self-pay | Admitting: Oncology

## 2019-04-15 ENCOUNTER — Other Ambulatory Visit: Payer: Self-pay | Admitting: General Surgery

## 2019-04-15 DIAGNOSIS — C50811 Malignant neoplasm of overlapping sites of right female breast: Secondary | ICD-10-CM

## 2019-04-15 NOTE — Telephone Encounter (Signed)
04/15/2019 - Called patient to schedule her for her 3 month visit for the Upbeat study.  Gave patient the information on what needed to be done for this visit.  The patient stated she would have to give me a call back to schedule these apts. Remer Macho 04/15/2019

## 2019-04-16 ENCOUNTER — Other Ambulatory Visit: Payer: Medicaid Other

## 2019-04-16 ENCOUNTER — Ambulatory Visit: Payer: Medicaid Other

## 2019-04-16 ENCOUNTER — Ambulatory Visit: Payer: Medicaid Other | Admitting: Hematology and Oncology

## 2019-04-17 ENCOUNTER — Other Ambulatory Visit (HOSPITAL_COMMUNITY)
Admission: RE | Admit: 2019-04-17 | Discharge: 2019-04-17 | Disposition: A | Discharge status: Home / Self Care | Payer: Medicaid Other | Source: Ambulatory Visit | Attending: General Surgery | Admitting: General Surgery

## 2019-04-17 ENCOUNTER — Telehealth: Payer: Self-pay | Admitting: Hematology and Oncology

## 2019-04-17 DIAGNOSIS — Z20822 Contact with and (suspected) exposure to covid-19: Secondary | ICD-10-CM | POA: Diagnosis not present

## 2019-04-17 DIAGNOSIS — Z01812 Encounter for preprocedural laboratory examination: Secondary | ICD-10-CM | POA: Diagnosis present

## 2019-04-17 LAB — SARS CORONAVIRUS 2 (TAT 6-24 HRS): SARS Coronavirus 2: NEGATIVE

## 2019-04-17 NOTE — Telephone Encounter (Signed)
Scheduled appt per 3/10 sch message - pt aware of appt date and time   

## 2019-04-20 ENCOUNTER — Encounter (HOSPITAL_COMMUNITY): Payer: Self-pay | Admitting: General Surgery

## 2019-04-20 ENCOUNTER — Other Ambulatory Visit: Payer: Self-pay | Admitting: General Surgery

## 2019-04-20 ENCOUNTER — Other Ambulatory Visit: Payer: Self-pay

## 2019-04-20 ENCOUNTER — Encounter: Payer: Self-pay | Admitting: *Deleted

## 2019-04-20 DIAGNOSIS — C50811 Malignant neoplasm of overlapping sites of right female breast: Secondary | ICD-10-CM

## 2019-04-20 DIAGNOSIS — Z170 Estrogen receptor positive status [ER+]: Secondary | ICD-10-CM

## 2019-04-20 NOTE — H&P (Signed)
Kara Pittman Documented: 04/14/2019 12:28 PM Location: [facility] Patient #: [id number] DOB: 15-Jun-1965 Single / Language: Kara Pittman / Race: White Female   History of Present Illness Kara Klein MD; 04/20/2019 10:42 AM) The patient is a 54 year old female who presents with breast cancer. Pt is a 54 yo F previously seen by Dr. Lucia Pittman. She was diagnosed with right breast cancer November 2020 with a palpable mass. She was found to have a mixed 3.3 cm solid/cystic mass at 6 o'clock. 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 On MRI, it was 6 cm with suggestion of pectoralis involvement. Genetics were negative. She has been getting chemotherapy with Dr. Lindi Pittman. She had adriamycin and cytoxan for 4 cycles followed by taxol/carbo. Her repeat MR did show some response, but her right breast has been getting very red and tense.   She is referred for mastectomy in a more urgent timeframe.   repeat breast RM 03/25/2019 IMPRESSION: 1. Two adjacent necrotic fluid collections in the inferior right breast corresponding with the patient's site of known malignancy. The overall size and morphology is similar when compared to recent ultrasound evaluation. While the size of this collection has increased compared to November 2020, the fluid itself does not enhance on today's study. 2. Improvement in peripheral enhancement surrounding the fluid collection, which has decreased in thickness and nodularity compared to November 2020. Additionally, surrounding non-mass parenchymal enhancement has resolved. 3. The overall appearance suggests some response to chemotherapy with a decrease in malignant enhancement but increase in size of nonenhancing fluid collection/necrosis. 4. No MRI evidence of malignancy on the left. 5. No suspicious lymphadenopathy.   Allergies (Kara Pittman, CMA; 04/14/2019 12:29 PM) No Known Allergies [12/15/2018]:  Medication  History (Kara Pittman, CMA; 04/14/2019 12:31 PM) Aspirin (81MG Tablet DR, Oral) Active. Wellbutrin XL (150MG Tablet ER 24HR, Oral) Active. Cytomel (5MCG Tablet, Oral) Active. Levothyroxine Sodium (75MCG Tablet, Oral) Active. Multiple Vitamin (1 (one) Oral) Active. Amoxicillin-Pot Clavulanate (875-125MG Tablet, Oral) Active. Claritin (10MG Tablet, Oral) Active. Medications Reconciled    Review of Systems Kara Klein MD; 04/20/2019 10:42 AM) All other systems negative  Vitals (Kara Pittman Benedict; 04/14/2019 12:32 PM) 04/14/2019 12:31 PM Weight: 157.38 lb Height: 65in Body Surface Area: 1.79 m Body Mass Index: 26.19 kg/m  Temp.: 98.15F(Tympanic)  Pulse: 108 (Regular)  P.OX: 97% (Room air) BP: 128/92 (Sitting, Left Arm, Standard)       Physical Exam Kara Klein MD; 04/20/2019 10:45 AM) General Mental Status-Alert. General Appearance-Consistent with stated age. Hydration-Well hydrated. Voice-Normal.  Head and Neck Head-normocephalic, atraumatic with no lesions or palpable masses.  Eye Sclera/Conjunctiva - Bilateral-No scleral icterus.  Chest and Lung Exam Chest and lung exam reveals -quiet, even and easy respiratory effort with no use of accessory muscles. Inspection Chest Wall - Normal. Back - normal.  Breast Note: right breast is tense and red. The skin does not have any dimpling, but below the nipple is a bit of a almost fluctuant area. The patient is having quite a bit of pain. NO axillary adenopathy.   Cardiovascular Cardiovascular examination reveals -normal pedal pulses bilaterally. Note: regular rate and rhythm  Abdomen Inspection-Inspection Normal. Palpation/Percussion Palpation and Percussion of the abdomen reveal - Soft, Non Tender, No Rebound tenderness, No Rigidity (guarding) and No hepatosplenomegaly.  Peripheral Vascular Upper Extremity Inspection - Bilateral - Normal - No Clubbing, No Cyanosis, No Edema,  Pulses Intact. Lower Extremity Palpation - Edema - Bilateral - No edema - Bilateral.  Neurologic  Neurologic evaluation reveals -alert and oriented x 3 with no impairment of recent or remote memory. Mental Status-Normal.  Musculoskeletal Global Assessment -Note: no gross deformities.  Normal Exam - Left-Upper Extremity Strength Normal and Lower Extremity Strength Normal. Normal Exam - Right-Upper Extremity Strength Normal and Lower Extremity Strength Normal.  Lymphatic Head & Neck  General Head & Neck Lymphatics: Bilateral - Description - Normal. Axillary  General Axillary Region: Bilateral - Description - Normal. Tenderness - Non Tender.    Assessment & Plan Kara Klein MD; 04/20/2019 10:51 AM) BREAST CANCER, STAGE 2, RIGHT (C50.911) Story: Right breast biopsy - 12/08/2018 ([id number]) shows IDC with metaplastic features, ER - 0%, PR 0%, Ki67 - 15%, Her2Neu - neg  Oncology - Kara Pittman/Kara Pittman Impression: Will plan mastectomy and SLn bx. Discussed that the patient is NOT a candidate for immediate reconstruction based on the potential for infection and pec involvement. The plastic surgeons will want information about margins. Also, the patient is at risk for needing radiation based on the final pathology.  I reviewed risks of bleeding, infection, chronic pain/numbness, dissatisfaction with scar, lymphedema, positive margins, possible need for additional surgery. I advised that she will need to stay overnight with a drain. The drain will be in at least 2 weeks. I also stated that rare anesthetic complications may occur. Current Plans You are being scheduled for surgery- Our schedulers will call you.  You should hear from our office's scheduling department within 5 working days about the location, date, and time of surgery. We try to make accommodations for patient's preferences in scheduling surgery, but sometimes the OR schedule or the surgeon's schedule prevents [place] from  making those accommodations.  If you have not heard from our office [phone]) in 5 working days, call the office and ask for your surgeon's nurse.  If you have other questions about your diagnosis, plan, or surgery, call the office and ask for your surgeon's nurse.  Pt Education - [facility] Mastectomy [facility]   Signed electronically by Kara Klein, MD (04/20/2019 10:54 AM)

## 2019-04-20 NOTE — Pre-Procedure Instructions (Signed)
    ESTELA BECKENDORF  04/20/2019     Your procedure is scheduled on Tuesday, March 16  Report to  at 7:00 A.M.  Call this number if you have problems the morning of surgery: [phone]  This is the number for the Pre- Surgical Desk.    Remember:  Do not eat after midnight.  You may drink clear liquids until 6:30 AM .  Clear liquids allowed are:                Pre- Surgery Ensure    Water, Juice (non-citric and without pulp), Carbonated beverages, Clear Tea, Black Coffee only, Plain Jell-O only, Gatorade and Plain Popsicles only    [place]- [facility] [place] is also important to reduce your risk of infection.  Remember - BRUSH YOUR TEETH THE MORNING OF SURGERY WITH YOUR REGULAR TOOTHPASTE  Please do not use if you have an allergy to CHG or antibacterial soaps. If your skin becomes reddened/irritated stop using the CHG.  Do not shave (including legs and underarms) for at least 48 hours prior to first CHG shower. It is OK to shave your face.  Please follow these instructions carefully.   1. Shower the NIGHT BEFORE SURGERY and the MORNING OF SURGERY with CHG.   2. If you chose to wash your hair, wash your hair first as usual with your normal shampoo.  3. After you shampoo, wash your face and private area with the soap you use at home, then rinse your hair and body thoroughly to remove the shampoo and soap.  4. Use CHG as you would any other liquid soap. You can apply CHG directly to the skin and wash gently with a scrungie or a clean washcloth.   Apply the CHG Soap to your body ONLY FROM THE NECK DOWN.  Do not use on open wounds or open sores. Avoid contact with your eyes, ears, mouth and genitals (private parts).  5. Wash thoroughly, paying special attention to the area where your surgery will be performed.  6. Thoroughly rinse your body with warm water from the neck down.  7. DO NOT shower/wash with your normal soap after using and rinsing off the CHG  Soap.  8. Pat yourself dry with a CLEAN TOWEL.  9. Wear CLEAN PAJAMAS to bed the night before surgery, wear comfortable clothes the morning of surgery  10. Place CLEAN SHEETS on your bed the night of your first shower and DO NOT SLEEP WITH PETS.  Day of Surgery: Shower as instructed above.Do not apply any deodorants/lotions.  Please wear clean clothes to the hospital/surgery center.   Remember to brush your teeth WITH YOUR REGULAR TOOTHPASTE.   Do not wear jewelry, make-up or nail polish.  Do not wear lotions, powders, or perfumes, or deodorant.  Do not shave 48 hours prior to surgery.   Do not bring valuables to the hospital.  [facility] is not responsible for any belongings or valuables.

## 2019-04-21 ENCOUNTER — Ambulatory Visit (HOSPITAL_COMMUNITY)
Admission: RE | Admit: 2019-04-21 | Discharge: 2019-04-22 | Disposition: A | Discharge status: Home / Self Care | Payer: Medicaid Other | Attending: General Surgery | Admitting: General Surgery

## 2019-04-21 ENCOUNTER — Other Ambulatory Visit: Payer: Self-pay

## 2019-04-21 ENCOUNTER — Ambulatory Visit (HOSPITAL_COMMUNITY): Payer: Medicaid Other | Admitting: Certified Registered Nurse Anesthetist

## 2019-04-21 ENCOUNTER — Encounter (HOSPITAL_COMMUNITY)
Admission: RE | Disposition: A | Discharge status: Home / Self Care | Payer: Self-pay | Source: Home / Self Care | Attending: General Surgery

## 2019-04-21 ENCOUNTER — Encounter (HOSPITAL_COMMUNITY)
Admission: RE | Admit: 2019-04-21 | Discharge: 2019-04-21 | Disposition: A | Discharge status: Home / Self Care | Payer: Medicaid Other | Source: Ambulatory Visit | Attending: General Surgery | Admitting: General Surgery

## 2019-04-21 ENCOUNTER — Encounter (HOSPITAL_COMMUNITY): Payer: Self-pay | Admitting: General Surgery

## 2019-04-21 DIAGNOSIS — G8918 Other acute postprocedural pain: Secondary | ICD-10-CM | POA: Diagnosis not present

## 2019-04-21 DIAGNOSIS — G709 Myoneural disorder, unspecified: Secondary | ICD-10-CM | POA: Insufficient documentation

## 2019-04-21 DIAGNOSIS — C50911 Malignant neoplasm of unspecified site of right female breast: Secondary | ICD-10-CM | POA: Diagnosis not present

## 2019-04-21 DIAGNOSIS — Z171 Estrogen receptor negative status [ER-]: Secondary | ICD-10-CM | POA: Diagnosis not present

## 2019-04-21 DIAGNOSIS — C50811 Malignant neoplasm of overlapping sites of right female breast: Secondary | ICD-10-CM | POA: Diagnosis present

## 2019-04-21 DIAGNOSIS — F329 Major depressive disorder, single episode, unspecified: Secondary | ICD-10-CM | POA: Diagnosis not present

## 2019-04-21 DIAGNOSIS — Z79899 Other long term (current) drug therapy: Secondary | ICD-10-CM | POA: Diagnosis not present

## 2019-04-21 DIAGNOSIS — Z7982 Long term (current) use of aspirin: Secondary | ICD-10-CM | POA: Insufficient documentation

## 2019-04-21 DIAGNOSIS — K219 Gastro-esophageal reflux disease without esophagitis: Secondary | ICD-10-CM | POA: Insufficient documentation

## 2019-04-21 DIAGNOSIS — C50311 Malignant neoplasm of lower-inner quadrant of right female breast: Secondary | ICD-10-CM | POA: Diagnosis not present

## 2019-04-21 DIAGNOSIS — Z79890 Hormone replacement therapy (postmenopausal): Secondary | ICD-10-CM | POA: Diagnosis not present

## 2019-04-21 DIAGNOSIS — E039 Hypothyroidism, unspecified: Secondary | ICD-10-CM | POA: Diagnosis not present

## 2019-04-21 DIAGNOSIS — M1990 Unspecified osteoarthritis, unspecified site: Secondary | ICD-10-CM | POA: Insufficient documentation

## 2019-04-21 HISTORY — DX: Other specified postprocedural states: R11.2

## 2019-04-21 HISTORY — DX: Other specified postprocedural states: Z98.890

## 2019-04-21 HISTORY — DX: Unspecified asthma, uncomplicated: J45.909

## 2019-04-21 HISTORY — DX: Malignant (primary) neoplasm, unspecified: C80.1

## 2019-04-21 HISTORY — DX: Unspecified osteoarthritis, unspecified site: M19.90

## 2019-04-21 HISTORY — DX: Depression, unspecified: F32.A

## 2019-04-21 HISTORY — DX: Other complications of anesthesia, initial encounter: T88.59XA

## 2019-04-21 HISTORY — DX: Headache, unspecified: R51.9

## 2019-04-21 HISTORY — PX: MASTECTOMY W/ SENTINEL NODE BIOPSY: SHX2001

## 2019-04-21 SURGERY — MASTECTOMY WITH SENTINEL LYMPH NODE BIOPSY
Anesthesia: Regional | Site: Breast | Laterality: Right

## 2019-04-21 MED ORDER — PANTOPRAZOLE SODIUM 40 MG PO TBEC
40.0000 mg | DELAYED_RELEASE_TABLET | Freq: Every day | ORAL | Status: DC
  Filled 2019-04-21: qty 1

## 2019-04-21 MED ORDER — LACTATED RINGERS IV SOLN: INTRAVENOUS | Status: DC

## 2019-04-21 MED ORDER — ACETAMINOPHEN 10 MG/ML IV SOLN: 1000.0000 mg | Freq: Once | INTRAVENOUS | Status: DC | PRN

## 2019-04-21 MED ORDER — 0.9 % SODIUM CHLORIDE (POUR BTL) OPTIME
TOPICAL | Status: DC | PRN
  Administered 2019-04-21 (×2): 1000 mL

## 2019-04-21 MED ORDER — ONDANSETRON 4 MG PO TBDP: 8.0000 mg | ORAL_TABLET | Freq: Three times a day (TID) | ORAL | Status: DC | PRN

## 2019-04-21 MED ORDER — KETAMINE HCL 50 MG/5ML IJ SOSY
PREFILLED_SYRINGE | INTRAMUSCULAR | Status: AC
  Filled 2019-04-21: qty 5

## 2019-04-21 MED ORDER — OXYCODONE HCL 5 MG PO TABS
5.0000 mg | ORAL_TABLET | Freq: Once | ORAL | Status: AC | PRN
  Administered 2019-04-21: 5 mg via ORAL

## 2019-04-21 MED ORDER — METHOCARBAMOL 500 MG PO TABS
500.0000 mg | ORAL_TABLET | Freq: Four times a day (QID) | ORAL | Status: DC | PRN
  Administered 2019-04-22: 500 mg via ORAL
  Filled 2019-04-21: qty 1

## 2019-04-21 MED ORDER — PROPOFOL 10 MG/ML IV BOLUS
INTRAVENOUS | Status: DC | PRN
  Administered 2019-04-21: 160 mg via INTRAVENOUS
  Administered 2019-04-21: 40 mg via INTRAVENOUS

## 2019-04-21 MED ORDER — LIDOCAINE 2% (20 MG/ML) 5 ML SYRINGE
INTRAMUSCULAR | Status: AC
  Filled 2019-04-21: qty 10

## 2019-04-21 MED ORDER — PROCHLORPERAZINE EDISYLATE 10 MG/2ML IJ SOLN: 5.0000 mg | Freq: Four times a day (QID) | INTRAMUSCULAR | Status: DC | PRN

## 2019-04-21 MED ORDER — SCOPOLAMINE 1 MG/3DAYS TD PT72
1.0000 | MEDICATED_PATCH | TRANSDERMAL | Status: DC
  Administered 2019-04-21: 1.5 mg via TRANSDERMAL
  Filled 2019-04-21: qty 1

## 2019-04-21 MED ORDER — PROCHLORPERAZINE MALEATE 10 MG PO TABS
10.0000 mg | ORAL_TABLET | Freq: Four times a day (QID) | ORAL | Status: DC | PRN
  Filled 2019-04-21: qty 1

## 2019-04-21 MED ORDER — LEVOTHYROXINE SODIUM 75 MCG PO TABS
75.0000 ug | ORAL_TABLET | Freq: Every day | ORAL | Status: DC
  Administered 2019-04-22: 75 ug via ORAL
  Filled 2019-04-21: qty 1

## 2019-04-21 MED ORDER — MIDAZOLAM HCL 2 MG/2ML IJ SOLN: 2.0000 mg | Freq: Once | INTRAMUSCULAR | Status: AC

## 2019-04-21 MED ORDER — BUPROPION HCL ER (XL) 150 MG PO TB24
150.0000 mg | ORAL_TABLET | Freq: Every day | ORAL | Status: DC
  Administered 2019-04-22: 150 mg via ORAL
  Filled 2019-04-21: qty 1

## 2019-04-21 MED ORDER — LIDOCAINE 2% (20 MG/ML) 5 ML SYRINGE
INTRAMUSCULAR | Status: DC | PRN
  Administered 2019-04-21: 60 mg via INTRAVENOUS

## 2019-04-21 MED ORDER — KCL IN DEXTROSE-NACL 20-5-0.45 MEQ/L-%-% IV SOLN
INTRAVENOUS | Status: AC
  Filled 2019-04-21: qty 1000

## 2019-04-21 MED ORDER — ENSURE PRE-SURGERY PO LIQD: 296.0000 mL | Freq: Once | ORAL | Status: DC

## 2019-04-21 MED ORDER — DOCUSATE SODIUM 100 MG PO CAPS
100.0000 mg | ORAL_CAPSULE | Freq: Two times a day (BID) | ORAL | Status: DC
  Administered 2019-04-21 – 2019-04-22 (×3): 100 mg via ORAL
  Filled 2019-04-21 (×3): qty 1

## 2019-04-21 MED ORDER — METHYLENE BLUE 0.5 % INJ SOLN
INTRAVENOUS | Status: AC
  Filled 2019-04-21: qty 10

## 2019-04-21 MED ORDER — SODIUM CHLORIDE (PF) 0.9 % IJ SOLN
INTRAMUSCULAR | Status: AC
  Filled 2019-04-21: qty 10

## 2019-04-21 MED ORDER — FENTANYL CITRATE (PF) 100 MCG/2ML IJ SOLN
INTRAMUSCULAR | Status: AC
  Administered 2019-04-21: 50 ug via INTRAVENOUS
  Filled 2019-04-21: qty 2

## 2019-04-21 MED ORDER — TRAMADOL HCL 50 MG PO TABS: 50.0000 mg | ORAL_TABLET | Freq: Four times a day (QID) | ORAL | Status: DC | PRN

## 2019-04-21 MED ORDER — TECHNETIUM TC 99M SULFUR COLLOID FILTERED
1.0000 | Freq: Once | INTRAVENOUS | Status: AC | PRN
  Administered 2019-04-21: 1 via INTRADERMAL

## 2019-04-21 MED ORDER — ONDANSETRON HCL 4 MG/2ML IJ SOLN
INTRAMUSCULAR | Status: DC | PRN
  Administered 2019-04-21: 4 mg via INTRAVENOUS

## 2019-04-21 MED ORDER — MIDAZOLAM HCL 2 MG/2ML IJ SOLN
INTRAMUSCULAR | Status: AC
  Filled 2019-04-21: qty 2

## 2019-04-21 MED ORDER — DEXAMETHASONE SODIUM PHOSPHATE 10 MG/ML IJ SOLN
INTRAMUSCULAR | Status: AC
  Filled 2019-04-21: qty 2

## 2019-04-21 MED ORDER — LORAZEPAM 0.5 MG PO TABS: 0.5000 mg | ORAL_TABLET | Freq: Every evening | ORAL | Status: DC | PRN

## 2019-04-21 MED ORDER — FENTANYL CITRATE (PF) 100 MCG/2ML IJ SOLN
25.0000 ug | INTRAMUSCULAR | Status: DC | PRN
  Administered 2019-04-21: 50 ug via INTRAVENOUS
  Administered 2019-04-21 (×2): 25 ug via INTRAVENOUS

## 2019-04-21 MED ORDER — GABAPENTIN 100 MG PO CAPS
100.0000 mg | ORAL_CAPSULE | Freq: Two times a day (BID) | ORAL | Status: DC
  Administered 2019-04-21 – 2019-04-22 (×3): 100 mg via ORAL
  Filled 2019-04-21 (×3): qty 1

## 2019-04-21 MED ORDER — ROCURONIUM BROMIDE 10 MG/ML (PF) SYRINGE
PREFILLED_SYRINGE | INTRAVENOUS | Status: AC
  Filled 2019-04-21: qty 20

## 2019-04-21 MED ORDER — LIOTHYRONINE SODIUM 5 MCG PO TABS
5.0000 ug | ORAL_TABLET | Freq: Every day | ORAL | Status: DC
  Administered 2019-04-22: 5 ug via ORAL
  Filled 2019-04-21 (×2): qty 1

## 2019-04-21 MED ORDER — PROPOFOL 500 MG/50ML IV EMUL
INTRAVENOUS | Status: DC | PRN
  Administered 2019-04-21: 125 ug/kg/min via INTRAVENOUS
  Administered 2019-04-21: 150 ug/kg/min via INTRAVENOUS

## 2019-04-21 MED ORDER — PROPOFOL 1000 MG/100ML IV EMUL
INTRAVENOUS | Status: AC
  Filled 2019-04-21: qty 100

## 2019-04-21 MED ORDER — FENTANYL CITRATE (PF) 250 MCG/5ML IJ SOLN
INTRAMUSCULAR | Status: DC | PRN
  Administered 2019-04-21 (×2): 50 ug via INTRAVENOUS

## 2019-04-21 MED ORDER — KETAMINE HCL 10 MG/ML IJ SOLN
INTRAMUSCULAR | Status: DC | PRN
  Administered 2019-04-21: 10 mg via INTRAVENOUS
  Administered 2019-04-21: 30 mg via INTRAVENOUS

## 2019-04-21 MED ORDER — BUPIVACAINE HCL (PF) 0.5 % IJ SOLN
INTRAMUSCULAR | Status: DC | PRN
  Administered 2019-04-21: 30 mL via PERINEURAL

## 2019-04-21 MED ORDER — FENTANYL CITRATE (PF) 100 MCG/2ML IJ SOLN
INTRAMUSCULAR | Status: AC
  Filled 2019-04-21: qty 2

## 2019-04-21 MED ORDER — PHENYLEPHRINE HCL-NACL 10-0.9 MG/250ML-% IV SOLN
INTRAVENOUS | Status: DC | PRN
  Administered 2019-04-21: 30 ug/min via INTRAVENOUS

## 2019-04-21 MED ORDER — ACETAMINOPHEN 500 MG PO TABS: 1000.0000 mg | ORAL_TABLET | Freq: Once | ORAL | Status: DC | PRN

## 2019-04-21 MED ORDER — CEFAZOLIN SODIUM-DEXTROSE 2-4 GM/100ML-% IV SOLN
2.0000 g | Freq: Three times a day (TID) | INTRAVENOUS | Status: AC
  Administered 2019-04-21: 2 g via INTRAVENOUS
  Filled 2019-04-21: qty 100

## 2019-04-21 MED ORDER — PHENYLEPHRINE 40 MCG/ML (10ML) SYRINGE FOR IV PUSH (FOR BLOOD PRESSURE SUPPORT)
PREFILLED_SYRINGE | INTRAVENOUS | Status: AC
  Filled 2019-04-21: qty 20

## 2019-04-21 MED ORDER — SODIUM CHLORIDE (PF) 0.9 % IJ SOLN
INTRAVENOUS | Status: DC | PRN
  Administered 2019-04-21: 5 mL

## 2019-04-21 MED ORDER — GABAPENTIN 100 MG PO CAPS
100.0000 mg | ORAL_CAPSULE | ORAL | Status: AC
  Administered 2019-04-21: 100 mg via ORAL
  Filled 2019-04-21: qty 1

## 2019-04-21 MED ORDER — SUCCINYLCHOLINE CHLORIDE 200 MG/10ML IV SOSY
PREFILLED_SYRINGE | INTRAVENOUS | Status: AC
  Filled 2019-04-21: qty 20

## 2019-04-21 MED ORDER — MORPHINE SULFATE (PF) 2 MG/ML IV SOLN
1.0000 mg | INTRAVENOUS | Status: DC | PRN
  Administered 2019-04-21: 2 mg via INTRAVENOUS
  Filled 2019-04-21: qty 1

## 2019-04-21 MED ORDER — ACETAMINOPHEN 500 MG PO TABS
1000.0000 mg | ORAL_TABLET | ORAL | Status: AC
  Administered 2019-04-21: 1000 mg via ORAL
  Filled 2019-04-21: qty 2

## 2019-04-21 MED ORDER — ACETAMINOPHEN 325 MG PO TABS
650.0000 mg | ORAL_TABLET | Freq: Four times a day (QID) | ORAL | Status: DC | PRN
Start: 2019-04-21 — End: 2019-04-22

## 2019-04-21 MED ORDER — DIPHENHYDRAMINE HCL 12.5 MG/5ML PO ELIX: 12.5000 mg | ORAL_SOLUTION | Freq: Four times a day (QID) | ORAL | Status: DC | PRN

## 2019-04-21 MED ORDER — FENTANYL CITRATE (PF) 100 MCG/2ML IJ SOLN
50.0000 ug | Freq: Once | INTRAMUSCULAR | Status: AC
  Administered 2019-04-21: 50 ug via INTRAVENOUS

## 2019-04-21 MED ORDER — CHLORHEXIDINE GLUCONATE CLOTH 2 % EX PADS: 6.0000 | MEDICATED_PAD | Freq: Once | CUTANEOUS | Status: DC

## 2019-04-21 MED ORDER — OXYCODONE HCL 5 MG PO TABS
5.0000 mg | ORAL_TABLET | ORAL | Status: DC | PRN
  Administered 2019-04-21 – 2019-04-22 (×4): 10 mg via ORAL
  Filled 2019-04-21 (×4): qty 2

## 2019-04-21 MED ORDER — CEFAZOLIN SODIUM-DEXTROSE 2-4 GM/100ML-% IV SOLN
2.0000 g | INTRAVENOUS | Status: AC
  Administered 2019-04-21: 10:00:00 2 g via INTRAVENOUS
  Filled 2019-04-21: qty 100

## 2019-04-21 MED ORDER — ACETAMINOPHEN 650 MG RE SUPP: 650.0000 mg | Freq: Four times a day (QID) | RECTAL | Status: DC | PRN

## 2019-04-21 MED ORDER — OXYCODONE HCL 5 MG/5ML PO SOLN: 5.0000 mg | Freq: Once | ORAL | Status: AC | PRN

## 2019-04-21 MED ORDER — CELECOXIB 200 MG PO CAPS
200.0000 mg | ORAL_CAPSULE | Freq: Two times a day (BID) | ORAL | Status: DC
  Administered 2019-04-21 – 2019-04-22 (×3): 200 mg via ORAL
  Filled 2019-04-21 (×4): qty 1

## 2019-04-21 MED ORDER — ACETAMINOPHEN 160 MG/5ML PO SOLN: 1000.0000 mg | Freq: Once | ORAL | Status: DC | PRN

## 2019-04-21 MED ORDER — EPHEDRINE 5 MG/ML INJ
INTRAVENOUS | Status: AC
  Filled 2019-04-21: qty 20

## 2019-04-21 MED ORDER — STERILE WATER FOR IRRIGATION IR SOLN
Status: DC | PRN
  Administered 2019-04-21: 1000 mL

## 2019-04-21 MED ORDER — DEXAMETHASONE SODIUM PHOSPHATE 10 MG/ML IJ SOLN
INTRAMUSCULAR | Status: DC | PRN
  Administered 2019-04-21: 10 mg via INTRAVENOUS

## 2019-04-21 MED ORDER — DIPHENHYDRAMINE HCL 50 MG/ML IJ SOLN: 12.5000 mg | Freq: Four times a day (QID) | INTRAMUSCULAR | Status: DC | PRN

## 2019-04-21 MED ORDER — FENTANYL CITRATE (PF) 100 MCG/2ML IJ SOLN: 50.0000 ug | Freq: Once | INTRAMUSCULAR | Status: AC

## 2019-04-21 MED ORDER — OXYCODONE HCL 5 MG PO TABS
ORAL_TABLET | ORAL | Status: AC
  Filled 2019-04-21: qty 1

## 2019-04-21 MED ORDER — FENTANYL CITRATE (PF) 250 MCG/5ML IJ SOLN
INTRAMUSCULAR | Status: AC
  Filled 2019-04-21: qty 5

## 2019-04-21 MED ORDER — MIDAZOLAM HCL 2 MG/2ML IJ SOLN
INTRAMUSCULAR | Status: AC
  Administered 2019-04-21: 2 mg via INTRAVENOUS
  Filled 2019-04-21: qty 2

## 2019-04-21 MED ORDER — MIDAZOLAM HCL 2 MG/2ML IJ SOLN
INTRAMUSCULAR | Status: DC | PRN
  Administered 2019-04-21: 2 mg via INTRAVENOUS

## 2019-04-21 MED ORDER — ONDANSETRON HCL 4 MG/2ML IJ SOLN
INTRAMUSCULAR | Status: AC
  Filled 2019-04-21: qty 4

## 2019-04-21 SURGICAL SUPPLY — 71 items
ATCH SMKEVC FLXB CAUT HNDSWH (FILTER) ×1 IMPLANT
BINDER BREAST XLRG (GAUZE/BANDAGES/DRESSINGS) ×1 IMPLANT
BIOPATCH RED 1 DISK 7.0 (GAUZE/BANDAGES/DRESSINGS) ×2 IMPLANT
BNDG COHESIVE 4X5 TAN STRL (GAUZE/BANDAGES/DRESSINGS) ×2 IMPLANT
CANISTER SUCT 3000ML PPV (MISCELLANEOUS) ×2 IMPLANT
CHLORAPREP W/TINT 26 (MISCELLANEOUS) ×2 IMPLANT
CLIP VESOCCLUDE LG 6/CT (CLIP) ×3 IMPLANT
CLIP VESOCCLUDE MED 6/CT (CLIP) ×2 IMPLANT
CLIP VESOCCLUDE SM WIDE 6/CT (CLIP) ×2 IMPLANT
CNTNR URN SCR LID CUP LEK RST (MISCELLANEOUS) ×1 IMPLANT
CONT SPEC 4OZ STRL OR WHT (MISCELLANEOUS) ×2
COVER PROBE W GEL 5X96 (DRAPES) ×2 IMPLANT
COVER SURGICAL LIGHT HANDLE (MISCELLANEOUS) ×3 IMPLANT
COVER WAND RF STERILE (DRAPES) ×2 IMPLANT
DERMABOND ADVANCED (GAUZE/BANDAGES/DRESSINGS) ×1
DERMABOND ADVANCED .7 DNX12 (GAUZE/BANDAGES/DRESSINGS) ×1 IMPLANT
DRAIN CHANNEL 19F RND (DRAIN) ×2 IMPLANT
DRSG PAD ABDOMINAL 8X10 ST (GAUZE/BANDAGES/DRESSINGS) ×1 IMPLANT
DRSG TEGADERM 4X4.75 (GAUZE/BANDAGES/DRESSINGS) ×2 IMPLANT
ELECT BLADE 4.0 EZ CLEAN MEGAD (MISCELLANEOUS) ×2
ELECT CAUTERY BLADE 6.4 (BLADE) ×2 IMPLANT
ELECT REM PT RETURN 9FT ADLT (ELECTROSURGICAL) ×2
ELECTRODE BLDE 4.0 EZ CLN MEGD (MISCELLANEOUS) IMPLANT
ELECTRODE REM PT RTRN 9FT ADLT (ELECTROSURGICAL) ×1 IMPLANT
EVACUATOR SILICONE 100CC (DRAIN) ×2 IMPLANT
EVACUATOR SMOKE ACCUVAC VALLEY (FILTER) ×2
GAUZE SPONGE 4X4 12PLY STRL (GAUZE/BANDAGES/DRESSINGS) ×1 IMPLANT
GLOVE BIO SURGEON STRL SZ 6 (GLOVE) ×2 IMPLANT
GLOVE BIOGEL PI IND STRL 7.5 (GLOVE) IMPLANT
GLOVE BIOGEL PI INDICATOR 7.5 (GLOVE) ×1
GLOVE ECLIPSE 7.5 STRL STRAW (GLOVE) ×1 IMPLANT
GLOVE INDICATOR 6.5 STRL GRN (GLOVE) ×2 IMPLANT
GOWN STRL REUS W/ TWL LRG LVL3 (GOWN DISPOSABLE) ×1 IMPLANT
GOWN STRL REUS W/TWL 2XL LVL3 (GOWN DISPOSABLE) ×2 IMPLANT
GOWN STRL REUS W/TWL LRG LVL3 (GOWN DISPOSABLE) ×2
KIT BASIN OR (CUSTOM PROCEDURE TRAY) ×2 IMPLANT
KIT TURNOVER KIT B (KITS) ×2 IMPLANT
LIGHT WAVEGUIDE WIDE FLAT (MISCELLANEOUS) ×2 IMPLANT
MARKER SKIN DUAL TIP RULER LAB (MISCELLANEOUS) ×2 IMPLANT
NDL 18GX1X1/2 (RX/OR ONLY) (NEEDLE) IMPLANT
NDL FILTER BLUNT 18X1 1/2 (NEEDLE) IMPLANT
NDL HYPO 25GX1X1/2 BEV (NEEDLE) IMPLANT
NDL SPNL 22GX3.5 QUINCKE BK (NEEDLE) ×1 IMPLANT
NEEDLE 18GX1X1/2 (RX/OR ONLY) (NEEDLE) ×2 IMPLANT
NEEDLE FILTER BLUNT 18X 1/2SAF (NEEDLE) ×1
NEEDLE FILTER BLUNT 18X1 1/2 (NEEDLE) ×1 IMPLANT
NEEDLE HYPO 25GX1X1/2 BEV (NEEDLE) ×2 IMPLANT
NEEDLE SPNL 22GX3.5 QUINCKE BK (NEEDLE) ×2 IMPLANT
NS IRRIG 1000ML POUR BTL (IV SOLUTION) ×2 IMPLANT
PACK GENERAL/GYN (CUSTOM PROCEDURE TRAY) ×2 IMPLANT
PACK UNIVERSAL I (CUSTOM PROCEDURE TRAY) ×2 IMPLANT
PAD ARMBOARD 7.5X6 YLW CONV (MISCELLANEOUS) ×2 IMPLANT
PENCIL SMOKE EVACUATOR (MISCELLANEOUS) ×2 IMPLANT
PREFILTER EVAC NS 1 1/3-3/8IN (MISCELLANEOUS) ×2 IMPLANT
SLEEVE SUCTION 125 (MISCELLANEOUS) ×1 IMPLANT
SPECIMEN JAR X LARGE (MISCELLANEOUS) ×2 IMPLANT
SPONGE LAP 18X18 RF (DISPOSABLE) ×2 IMPLANT
STAPLER VISISTAT 35W (STAPLE) ×2 IMPLANT
STOCKINETTE IMPERVIOUS 9X36 MD (GAUZE/BANDAGES/DRESSINGS) ×2 IMPLANT
STRIP CLOSURE SKIN 1/2X4 (GAUZE/BANDAGES/DRESSINGS) ×2 IMPLANT
SUT ETHILON 2 0 FS 18 (SUTURE) ×2 IMPLANT
SUT MON AB 4-0 PC3 18 (SUTURE) ×2 IMPLANT
SUT SILK 2 0 (SUTURE) ×2
SUT SILK 2 0 PERMA HAND 18 BK (SUTURE) ×3 IMPLANT
SUT SILK 2-0 18XBRD TIE 12 (SUTURE) ×1 IMPLANT
SUT VIC AB 3-0 SH 8-18 (SUTURE) ×2 IMPLANT
SYR 50ML LL SCALE MARK (SYRINGE) ×2 IMPLANT
TOWEL GREEN STERILE (TOWEL DISPOSABLE) ×2 IMPLANT
TOWEL GREEN STERILE FF (TOWEL DISPOSABLE) ×2 IMPLANT
TUBE CONNECTING 12X1/4 (SUCTIONS) ×2 IMPLANT
YANKAUER SUCT BULB TIP NO VENT (SUCTIONS) ×1 IMPLANT

## 2019-04-21 NOTE — Interval H&P Note (Signed)
History and Physical Interval Note:  04/21/2019 9:38 AM  Kara Pittman  has presented today for surgery, with the diagnosis of RIGHT BREAST CANCER.  The various methods of treatment have been discussed with the patient and family. After consideration of risks, benefits and other options for treatment, the patient has consented to  Procedure(s): RIGHT MASTECTOMY WITH SENTINEL LYMPH NODE BIOPSY (Right) as a surgical intervention.  The patient's history has been reviewed, patient examined, no change in status, stable for surgery.  I have reviewed the patient's chart and labs.  Questions were answered to the patient's satisfaction.     [name]

## 2019-04-21 NOTE — Op Note (Signed)
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 The risks, benefits, complications, treatment options, and expected outcomes were discussed with the patient. The possibilities of reaction to medication, pulmonary aspiration, bleeding, infection, the need for additional procedures, failure to diagnose a condition, and creating a complication requiring transfusion or operation were discussed with the patient. The patient concurred with the proposed plan, giving informed consent.  The site of surgery properly noted/marked. The patient was taken to Operating Room # 2, identified as Kara Pittman and the procedure verified as Right Mastectomy and Sentinel Node Biopsy. A Time Out was held and the above information confirmed.  The methylene blue was injected in the subareolar location.    After induction of anesthesia, the right arm, breast, and chest were prepped and draped in standard fashion.   The borders of the breast were identified and marked.  The incisions of the breast were drawn out to make sure incision lines were equidistant in length.    The superior incisions were made with the #10 blade. Because of the redness of the skin, the skin margins were marked.   Mastectomy hooks were used to provide elevation of the skin edges, and the cautery was used to create the mastectomy flaps.  The dissection was taken to the fascia of the pectoralis major.   The skin margins were sent for frozen.  The penetrating vessels were clipped as needed.  The superior flap was taken medially to thlateral  sternal border, superiorly to the inferior border of the clavicle.  The inferior flap was similarly create ed, inferiorly to the inframammary fold and laterally to the border of the latissimus.  The breast was taken off including the pectoralis fascia except where the mass felt tethered to the pectoralis.  A portion of pectoralis was taken en bloc with the breast.  The closest part of the remaining muscle was marked with large metal clips.  The back of the breast at this site was marked with a stitch as well as the axillary tail.   Using a hand-held gamma probe, axillary sentinel nodes were identified.  Three deep level 2 axillary sentinel nodes were removed and submitted to pathology.  The findings are below.  The lymphovascular channels were clipped with metal clips.        The wound was irrigated. One 19 Blake drain was placed laterally.   Hemostasis was achieved with cautery.   The frozen sections of the skin margins came back negative.   The wound was irrigated and closed with a 3-0 Vicryl deep dermal interrupted sutures and 4-0 Vicryl subcuticular closure in layers.    Sterile dressings were applied. At the end of the operation, all sponge, instrument, and needle counts were correct.  Findings: grossly clear surgical margins, a portion of pectoralis was taken as the mass was adherent.  The area of skin fluctuance/discoloration did appear to have tumor component.   Estimated Blood Loss: 50 mL          Drains: 19 Fr blake drain in right chest wall               Specimens: right breast and three  deep right axillary sentinel nodes, five skin margins.         Complications:  None; patient tolerated the procedure well.         Disposition: PACU - hemodynamically stable.         Condition: stable

## 2019-04-21 NOTE — Transfer of Care (Signed)
Immediate Anesthesia Transfer of Care Note  Patient: Kara Pittman  Procedure(s) Performed: RIGHT MASTECTOMY WITH SENTINEL LYMPH NODE BIOPSY (Right Breast)  Patient Location: PACU  Anesthesia Type:General and GA combined with regional for post-op pain  Level of Consciousness: drowsy and patient cooperative  Airway & Oxygen Therapy: Patient Spontanous Breathing and Patient connected to nasal cannula oxygen  Post-op Assessment: Report given to RN, Post -op Vital signs reviewed and stable and Patient moving all extremities X 4  Post vital signs: Reviewed and stable  Last Vitals:  Vitals Value Taken Time  BP    Temp    Pulse 93 04/21/19 1237  Resp 12 04/21/19 1237  SpO2 99 % 04/21/19 1237  Vitals shown include unvalidated device data.  Last Pain:  Vitals:   04/21/19 0830  TempSrc:   PainSc: 4       Patients Stated Pain Goal: 3 ([id number])  Complications: No apparent anesthesia complications

## 2019-04-21 NOTE — Anesthesia Procedure Notes (Signed)
Anesthesia Regional Block: Pectoralis block   Pre-Anesthetic Checklist: ,, timeout performed, Correct Patient, Correct Site, Correct Laterality, Correct Procedure, Correct Position, site marked, Risks and benefits discussed,  Surgical consent,  Pre-op evaluation,  At surgeon's request and post-op pain management  Laterality: Right and Upper  Prep: chloraprep       Needles:  Injection technique: Single-shot     Needle Length: 9cm  Needle Gauge: 22     Additional Needles: Arrow StimuQuik ECHO Echogenic Stimulating PNB Needle  Procedures:,,,, ultrasound used (permanent image in chart),,,,  Narrative:  Start time: 04/21/2019 10:03 AM End time: 04/21/2019 10:10 AM Injection made incrementally with aspirations every 5 mL.  Performed by: Personally  Anesthesiologist: Oleta Mouse, MD

## 2019-04-21 NOTE — Anesthesia Preprocedure Evaluation (Signed)
Anesthesia Evaluation  Patient identified by MRN, date of birth, ID band Patient awake    Reviewed: Allergy & Precautions, NPO status , Patient's Chart, lab work & pertinent test results  History of Anesthesia Complications (+) PONV and history of anesthetic complications  Airway Mallampati: II  TM Distance: >3 FB Neck ROM: Full    Dental  (+) Dental Advisory Given, Teeth Intact   Pulmonary neg shortness of breath, asthma , neg sleep apnea, neg recent URI,    breath sounds clear to auscultation       Cardiovascular negative cardio ROS   Rhythm:Regular     Neuro/Psych  Headaches, PSYCHIATRIC DISORDERS Depression  Neuromuscular disease    GI/Hepatic Neg liver ROS, GERD  Medicated and Controlled,  Endo/Other  Hypothyroidism   Renal/GU negative Renal ROS     Musculoskeletal  (+) Arthritis ,   Abdominal   Peds  Hematology  (+) Blood dyscrasia, anemia ,   Anesthesia Other Findings   Reproductive/Obstetrics                             Anesthesia Physical Anesthesia Plan  ASA: II  Anesthesia Plan: General and Regional   Post-op Pain Management:  Regional for Post-op pain   Induction: Intravenous  PONV Risk Score and Plan: 4 or greater and Ondansetron, Dexamethasone, Propofol infusion, TIVA, Midazolam and Scopolamine patch - Pre-op  Airway Management Planned: LMA  Additional Equipment: None  Intra-op Plan:   Post-operative Plan: Extubation in OR  Informed Consent: I have reviewed the patients History and Physical, chart, labs and discussed the procedure including the risks, benefits and alternatives for the proposed anesthesia with the patient or authorized representative who has indicated his/her understanding and acceptance.     Dental advisory given  Plan Discussed with: CRNA and Surgeon  Anesthesia Plan Comments:         Anesthesia Quick Evaluation

## 2019-04-21 NOTE — Discharge Instructions (Signed)
[facility] [place] °[phone] ° °MASTECTOMY: POST OP INSTRUCTIONS ° °Always review your discharge instruction sheet given to you by the facility where your surgery was performed. °IF YOU HAVE DISABILITY OR FAMILY LEAVE FORMS, YOU MUST BRING THEM TO THE OFFICE FOR PROCESSING.   °DO NOT GIVE THEM TO YOUR DOCTOR. °A prescription for pain medication may be given to you upon discharge.  Take your pain medication as prescribed, if needed.  If narcotic pain medicine is not needed, then you may take acetaminophen (Tylenol) or ibuprofen (Advil) as needed. °1. Take your usually prescribed medications unless otherwise directed. °2. If you need a refill on your pain medication, please contact your pharmacy.  They will contact our office to request authorization.  Prescriptions will not be filled after 5pm or on week-ends. °3. You should follow a light diet the first few days after arrival home, such as soup and crackers, etc.  Resume your normal diet the day after surgery. °4. Most patients will experience some swelling and bruising on the chest and underarm.  Ice packs will help.  Swelling and bruising can take several days to resolve.  °5. It is common to experience some constipation if taking pain medication after surgery.  Increasing fluid intake and taking a stool softener (such as Colace) will usually help or prevent this problem from occurring.  A mild laxative (Milk of Magnesia or Miralax) should be taken according to package instructions if there are no bowel movements after 48 hours. °6. Unless discharge instructions indicate otherwise, leave your bandage dry and in place until your next appointment in 3-5 days.  You may take a limited sponge bath.  No tube baths or showers until the drains are removed.  You may have steri-strips (small skin tapes) in place directly over the incision.  These strips should be left on the skin for 7-10 days.  If your surgeon used skin glue on the incision, you may  shower in 24 hours.  The glue will flake off over the next 2-3 weeks.  Any sutures or staples will be removed at the office during your follow-up visit. °7. DRAINS:  If you have drains in place, it is important to keep a list of the amount of drainage produced each day in your drains.  Before leaving the hospital, you should be instructed on drain care.  Call our office if you have any questions about your drains. °8. ACTIVITIES:  You may resume regular (light) daily activities beginning the next day--such as daily self-care, walking, climbing stairs--gradually increasing activities as tolerated.  You may have sexual intercourse when it is comfortable.  Refrain from any heavy lifting or straining until approved by your doctor. °a. You may drive when you are no longer taking prescription pain medication, you can comfortably wear a seatbelt, and you can safely maneuver your car and apply brakes. °b. RETURN TO WORK:  __________________________________________________________ °9. You should see your doctor in the office for a follow-up appointment approximately 3-5 days after your surgery.  Your doctor’s nurse will typically make your follow-up appointment when she calls you with your pathology report.  Expect your pathology report 2-3 business days after your surgery.  You may call to check if you do not hear from us after three days.   °10. OTHER INSTRUCTIONS: ______________________________________________________________________________________________ ____________________________________________________________________________________________ ° °WHEN TO CALL YOUR DOCTOR: °1. Fever over 101.0 °2. Nausea and/or vomiting °3. Extreme swelling or bruising °4. Continued bleeding from incision. °5. Increased pain, redness, or drainage from the   The clinic staff is available to answer your questions during regular business hours.  Please don't hesitate to call and ask to speak to one of the nurses for clinical  concerns.  If you have a medical emergency, go to the nearest emergency room or call 911.  A surgeon from [facility] is always on call at the hospital. [address] [place], [place], [place]  [address] ? [address] [place], [place], [place]   [address] [phone] ? [phone] ? FAX [phone]    [facility] drains are used to remove extra fluid that normally builds up in a surgical wound after surgery. A surgical drain helps to heal a surgical wound. Different kinds of surgical drains include:  Active drains. These drains use suction to pull drainage away from the surgical wound. Drainage flows through a tube to a container outside of the body. With these drains, you need to keep the bulb or the drainage container flat (compressed) at all times, except while you empty it. Flattening the bulb or container creates suction.  Passive drains. These drains allow fluid to drain naturally, by gravity. Drainage flows through a tube to a bandage (dressing) or a container outside of the body. Passive drains do not need to be emptied. A drain is placed during surgery. Right after surgery, drainage is usually bright red and a little thicker than water. The drainage may gradually turn yellow or pink and become thinner. It is likely that your health care provider will remove the drain when the drainage stops or when the amount decreases to 1-2 Tbsp (15-30 mL) during a 24-hour period. Supplies needed:  Tape.  Germ-free cleaning solution (sterile saline).  Cotton swabs.  Split gauze drain sponge: 4 x 4 inches (10 x 10 cm).  Gauze square: 4 x 4 inches (10 x 10 cm). How to care for your surgical drain Care for your drain as told by your health care provider. This is important to help prevent infection. If your drain is placed at your back, or any other hard-to-reach area, ask another person to assist you in performing the following tasks: General care  Keep the  skin around the drain dry and covered with a dressing at all times.  Check your drain area every day for signs of infection. Check for: ? Redness, swelling, or pain. ? Pus or a bad smell. ? Cloudy drainage. ? Tenderness or pressure at the drain exit site. Changing the dressing Follow instructions from your health care provider about how to change your dressing. Change your dressing at least once a day. Change it more often if needed to keep the dressing dry. Make sure you: 1. Gather your supplies. 2. Wash your hands with soap and water before you change your dressing. If soap and water are not available, use hand sanitizer. 3. Remove the old dressing. Avoid using scissors to do that. 4. Wash your hands with soap and water again after removing the old dressing. 5. Use sterile saline to clean your skin around the drain. You may need to use a cotton swab to clean the skin. 6. Place the tube through the slit in a drain sponge. Place the drain sponge so that it covers your wound. 7. Place the gauze square or another drain sponge on top of the drain sponge that is on the wound. Make sure the tube is between those layers. 8. Tape the dressing to your skin. 9. Tape the drainage tube to your  skin 1-2 inches (2.5-5 cm) below the place where the tube enters your body. Taping keeps the tube from pulling on any stitches (sutures) that you have. 10. Wash your hands with soap and water. 11. Write down the color of your drainage and how often you change your dressing. How to empty your active drain  1. Make sure that you have a measuring cup that you can empty your drainage into. 2. Wash your hands with soap and water. If soap and water are not available, use hand sanitizer. 3. Loosen any pins or clips that hold the tube in place. 4. If your health care provider tells you to strip the tube to prevent clots and tube blockages: ? Hold the tube at the skin with one hand. Use your other hand to pinch the  tubing with your thumb and first finger. ? Gently move your fingers down the tube while squeezing very lightly. This clears any drainage, clots, or tissue from the tube. ? You may need to do this several times each day to keep the tube clear. Do not pull on the tube. 5. Open the bulb cap or the drain plug. Do not touch the inside of the cap or the bottom of the plug. 6. Turn the device upside down and gently squeeze. 7. Empty all of the drainage into the measuring cup. 8. Compress the bulb or the container and replace the cap or the plug. To compress the bulb or the container, squeeze it firmly in the middle while you close the cap or plug the container. 9. Write down the amount of drainage that you have in each 24-hour period. If you have less than 2 Tbsp (30 mL) of drainage during 24 hours, contact your health care provider. 10. Flush the drainage down the toilet. 11. Wash your hands with soap and water. Contact a health care provider if:  You have redness, swelling, or pain around your drain area.  You have pus or a bad smell coming from your drain area.  You have a fever or chills.  The skin around your drain is warm to the touch.  The amount of drainage that you have is increasing instead of decreasing.  You have drainage that is cloudy.  There is a sudden stop or a sudden decrease in the amount of drainage that you have.  Your drain tube falls out.  Your active drain does not stay compressed after you empty it. Summary  Surgical drains are used to remove extra fluid that normally builds up in a surgical wound after surgery.  Different kinds of surgical drains include active drains and passive drains. Active drains use suction to pull drainage away from the surgical wound, and passive drains allow fluid to drain naturally.  It is important to care for your drain to prevent infection. If your drain is placed at your back, or any other hard-to-reach area, ask another person to  assist you.  Contact your health care provider if you have redness, swelling, or pain around your drain area. This information is not intended to replace advice given to you by your health care provider. Make sure you discuss any questions you have with your health care provider. Document Revised: 02/26/2018 Document Reviewed: 02/26/2018 Elsevier Patient Education  2020 [employer]   

## 2019-04-21 NOTE — Anesthesia Procedure Notes (Signed)
Procedure Name: LMA Insertion Date/Time: 04/21/2019 10:30 AM Performed by: Oleta Mouse, MD Pre-anesthesia Checklist: Patient identified, Emergency Drugs available, Suction available and Patient being monitored Patient Re-evaluated:Patient Re-evaluated prior to induction Oxygen Delivery Method: Circle system utilized Preoxygenation: Pre-oxygenation with 100% oxygen Induction Type: IV induction Ventilation: Mask ventilation without difficulty LMA: LMA inserted LMA Size: 3.0 Number of attempts: 1 Placement Confirmation: positive ETCO2,  breath sounds checked- equal and bilateral and CO2 detector Tube secured with: Tape Dental Injury: Teeth and Oropharynx as per pre-operative assessment

## 2019-04-22 DIAGNOSIS — C50911 Malignant neoplasm of unspecified site of right female breast: Secondary | ICD-10-CM | POA: Diagnosis not present

## 2019-04-22 LAB — CBC
HCT: 32.3 % — ABNORMAL LOW (ref 36.0–46.0)
Hemoglobin: 10.6 g/dL — ABNORMAL LOW (ref 12.0–15.0)
MCH: 32.9 pg (ref 26.0–34.0)
MCHC: 32.8 g/dL (ref 30.0–36.0)
MCV: 100.3 fL — ABNORMAL HIGH (ref 80.0–100.0)
Platelets: 331 10*3/uL (ref 150–400)
RBC: 3.22 MIL/uL — ABNORMAL LOW (ref 3.87–5.11)
RDW: 13.2 % (ref 11.5–15.5)
WBC: 6.7 10*3/uL (ref 4.0–10.5)
nRBC: 0 % (ref 0.0–0.2)

## 2019-04-22 MED ORDER — GABAPENTIN 100 MG PO CAPS: 100.0000 mg | ORAL_CAPSULE | Freq: Two times a day (BID) | ORAL | 0 refills | Status: DC

## 2019-04-22 MED ORDER — DOCUSATE SODIUM 100 MG PO CAPS: 100.0000 mg | ORAL_CAPSULE | Freq: Two times a day (BID) | ORAL | 0 refills | Status: DC

## 2019-04-22 MED ORDER — OXYCODONE HCL 5 MG PO TABS: 5.0000 mg | ORAL_TABLET | ORAL | 0 refills | Status: DC | PRN

## 2019-04-22 MED ORDER — METHOCARBAMOL 500 MG PO TABS: 500.0000 mg | ORAL_TABLET | Freq: Four times a day (QID) | ORAL | 0 refills | Status: DC | PRN

## 2019-04-22 MED FILL — METHOCARBAMOL 500 MG TABS: 500 | 5 days supply | Qty: 20 | Fill #0

## 2019-04-22 MED FILL — GABAPENTIN 100 MG CAPSULE: 100 | 30 days supply | Qty: 60 | Fill #0

## 2019-04-22 MED FILL — oxyCODONE HCL 5 MG TABS: 5 | 3 days supply | Qty: 30 | Fill #0

## 2019-04-22 NOTE — Care Management (Signed)
Pt deemed stable for discharge home today.  No TOC needs/consult/orders found.  CM signing off

## 2019-04-22 NOTE — Discharge Summary (Addendum)
Physician Discharge Summary  Patient ID: Kara Pittman MRN: [id number] DOB/AGE: 56-May-1967 56 y.o.  Admit date: 04/21/2019 Discharge date: 04/22/2019  Admission Diagnoses: Right breast cancer, s/p neoadjuvant chemotherapy.  Discharge Diagnoses:  R breast cancer, LOQ, metaplastic, cT2N0M0, triple negative  Discharged Condition: stable  Hospital Course:  Pt was admitted to the floor following a right mastectomy with sentinel lymph node biopsy 04/21/2019.  She did well overnight.  She was ambulatory.  She exhibited knowledge of drains.  She was able to void spontaneously. Drain output was serosang as expected.  She tolerated oral meds for pain.    Consults: None  Significant Diagnostic Studies: labs: HCT 32.3 (pre op 34.6) prior to d/c.    Treatments: surgery: see above.    Discharge Exam: Blood pressure 94/67, pulse 88, temperature 98.3 F (36.8 C), temperature source Oral, resp. rate 17, height 5\' 4"  (1.626 m), weight 69.9 kg, SpO2 97 %. General appearance: alert, cooperative and no distress Resp: breathing comfortably Breasts: anticipated right chest wall tenderness. no flap hematoma  present. drain serosang Extremities: extremities normal, atraumatic, no cyanosis or edema  Disposition: Discharge disposition: 01-Home or Self Care       Discharge Instructions    Call MD for:  difficulty breathing, headache or visual disturbances   Complete by: As directed    Call MD for:  hives   Complete by: As directed    Call MD for:  persistant nausea and vomiting   Complete by: As directed    Call MD for:  redness, tenderness, or signs of infection (pain, swelling, redness, odor or green/yellow discharge around incision site)   Complete by: As directed    Call MD for:  severe uncontrolled pain   Complete by: As directed    Call MD for:  temperature >100.4   Complete by: As directed    Change dressing (specify)   Complete by: As directed    Measure and record drain output 1-3  times per day.  Bring record to clinic.   Diet - low sodium heart healthy   Complete by: As directed    Increase activity slowly   Complete by: As directed      Allergies as of 04/22/2019   No Known Allergies     Medication List    TAKE these medications   acetaminophen 500 MG tablet Commonly known as: TYLENOL Take 500-1,000 mg by mouth every 6 (six) hours as needed for mild pain or headache.   amoxicillin-clavulanate 875-125 MG tablet Commonly known as: Augmentin Take 1 tablet by mouth 2 (two) times daily for 14 days.   b complex vitamins tablet Take 1 tablet by mouth at bedtime.   bimatoprost 0.03 % ophthalmic solution Commonly known as: Latisse Place 0.03 mLs into both eyes at bedtime. Place one drop on applicator and apply evenly along the skin of the upper eyelid at base of eyelashes once daily at bedtime; repeat procedure for second eye (use a clean applicator).   buPROPion 150 MG 24 hr tablet Commonly known as: WELLBUTRIN XL Take 1 tablet by mouth daily What changed: when to take this   CALCIUM PO Take 1 tablet by mouth at bedtime.   Cytomel 5 MCG tablet Generic drug: liothyronine Take 1 tablet by mouth once daily What changed:   how much to take  when to take this   docusate sodium 100 MG capsule Commonly known as: COLACE Take 1 capsule (100 mg total) by mouth 2 (two) times daily.  gabapentin 100 MG capsule Commonly known as: NEURONTIN Take 1 capsule (100 mg total) by mouth 2 (two) times daily.   lidocaine-prilocaine cream Commonly known as: EMLA Apply to affected area once   LORazepam 0.5 MG tablet Commonly known as: Ativan Take 1 tablet (0.5 mg total) by mouth at bedtime as needed for sleep.   methocarbamol 500 MG tablet Commonly known as: ROBAXIN Take 1 tablet (500 mg total) by mouth every 6 (six) hours as needed for muscle spasms.   ondansetron 8 MG disintegrating tablet Commonly known as: ZOFRAN-ODT Take 1 tablet (8 mg total) by mouth  every 8 (eight) hours as needed for nausea or vomiting. What changed: reasons to take this   One-A-Day Womens Formula Tabs Take 1 tablet by mouth at bedtime.   oxyCODONE 5 MG immediate release tablet Commonly known as: Oxy IR/ROXICODONE Take 1-2 tablets (5-10 mg total) by mouth every 4 (four) hours as needed for moderate pain.   pantoprazole 40 MG tablet Commonly known as: Protonix Take 1 tablet (40 mg total) by mouth daily. What changed: when to take this   prochlorperazine 10 MG tablet Commonly known as: COMPAZINE TAKE 1 TABLET BY MOUTH EVERY 6 HOURS AS NEEDED FOR NAUSEA AND/OR VOMITING What changed: See the new instructions.   promethazine 25 MG suppository Commonly known as: PHENERGAN Place 1 suppository (25 mg total) rectally every 6 (six) hours as needed for nausea or vomiting.   Synthroid 75 MCG tablet Generic drug: levothyroxine Take 1 tablet by mouth once daily What changed:   how much to take  when to take this            Discharge Care Instructions  (From admission, onward)         Start     Ordered   04/22/19 0000  Change dressing (specify)    Comments: Measure and record drain output 1-3 times per day.  Bring record to clinic.   04/22/19 1125         Follow-up Information    [name], MD In 2 weeks.   Specialty: General Surgery Contact information: [address] [place] [address] [phone]           Signed: [name] 04/22/2019, 12:30 PM

## 2019-04-23 MED FILL — DIFFERIN 0.1% CREAM: 0.1 | 20 days supply | Qty: 45 | Fill #0

## 2019-04-24 NOTE — Anesthesia Postprocedure Evaluation (Signed)
Anesthesia Post Note  Patient: Kara Pittman  Procedure(s) Performed: RIGHT MASTECTOMY WITH SENTINEL LYMPH NODE BIOPSY (Right Breast)     Patient location during evaluation: PACU Anesthesia Type: Regional and General Level of consciousness: awake and alert Pain management: pain level controlled Vital Signs Assessment: post-procedure vital signs reviewed and stable Respiratory status: spontaneous breathing, nonlabored ventilation, respiratory function stable and patient connected to nasal cannula oxygen Cardiovascular status: blood pressure returned to baseline and stable Postop Assessment: no apparent nausea or vomiting Anesthetic complications: no    Last Vitals:  Vitals:   04/22/19 0612 04/22/19 1217  BP: 95/63 94/67  Pulse: 93 88  Resp: 18 17  Temp: 36.9 C 36.8 C  SpO2: 100% 97%    Last Pain:  Vitals:   04/22/19 1217  TempSrc: Oral  PainSc:                  Kara Pittman

## 2019-04-27 ENCOUNTER — Encounter: Payer: Self-pay | Admitting: *Deleted

## 2019-04-27 LAB — SURGICAL PATHOLOGY

## 2019-04-28 ENCOUNTER — Other Ambulatory Visit: Payer: Self-pay | Admitting: Hematology and Oncology

## 2019-04-28 DIAGNOSIS — Z171 Estrogen receptor negative status [ER-]: Secondary | ICD-10-CM

## 2019-04-28 DIAGNOSIS — C50311 Malignant neoplasm of lower-inner quadrant of right female breast: Secondary | ICD-10-CM

## 2019-04-28 MED FILL — SYNTHROID 75 MCG TABLET: 75 | 90 days supply | Qty: 90 | Fill #1

## 2019-04-28 MED FILL — buPROPion HCL ER (XL) 150 M: 150 | 90 days supply | Qty: 90 | Fill #1

## 2019-04-28 MED FILL — DIFFERIN 0.1% CREAM: 0.1 | 20 days supply | Qty: 45 | Fill #0

## 2019-04-28 MED FILL — PROCHLORPERAZINE 10 MG TAB: 10 | 7 days supply | Qty: 30 | Fill #0

## 2019-04-28 NOTE — Progress Notes (Signed)
Patient Care Team: [name], MD as PCP - General (Family Medicine) [name], RN as Oncology Nurse Navigator [name], RN as Oncology Nurse Navigator [name], MD as Consulting Physician (General Surgery) [name], MD as Consulting Physician (Hematology and Oncology) [name], MD as Consulting Physician (Radiation Oncology) [name], RN as Registered Nurse (Medical Oncology)  DIAGNOSIS:    ICD-10-CM   1. Malignant neoplasm of lower-inner quadrant of right breast of female, estrogen receptor negative ([place])  C50.311 CT Abdomen Pelvis W Contrast   Z17.1 CT Chest W Contrast    SUMMARY OF ONCOLOGIC HISTORY: Oncology History  Malignant neoplasm of lower-inner quadrant of right breast of female, estrogen receptor negative ([place])  12/12/2018 Initial Diagnosis   Patient palpated right breast mass. Mammogram showed a 3.3cm solid and cystic right breast mass, 6 o'clock position, no abnormal right axillary lymph nodes. 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    12/17/2018 Cancer Staging   Staging form: Breast, AJCC 8th Edition - Clinical stage from 12/17/2018: Stage IIB (cT2, cN0, cM0, G3, ER-, PR-, HER2-) - Signed by [name], MD on 12/17/2018   12/23/2018 Genetic Testing   Negative genetic testing:  No pathogenic variants detected on the [facility] Multi-Cancer panel. A variant of uncertain significance was detected in the BARD1 gene called c.26_[id number]. The report date is 12/23/2018.  The Multi-Cancer Panel offered by [facility] includes sequencing and/or deletion duplication testing of the following 85 genes: AIP, ALK, APC, ATM, AXIN2,BAP1,  BARD1, BLM, BMPR1A, BRCA1, BRCA2, BRIP1, CASR, CDC73, CDH1, CDK4, CDKN1B, CDKN1C, CDKN2A (p14ARF), CDKN2A (p16INK4a), CEBPA, CHEK2, CTNNA1, DICER1, DIS3L2, EGFR (c.2369C>T, p.Thr790Met variant only), EPCAM (Deletion/duplication testing only), FH, FLCN, GATA2,  GPC3, GREM1 (Promoter region deletion/duplication testing only), HOXB13 (c.251G>A, p.Gly84Glu), HRAS, KIT, MAX, MEN1, MET, MITF (c.952G>A, p.Glu318Lys variant only), MLH1, MSH2, MSH3, MSH6, MUTYH, NBN, NF1, NF2, NTHL1, PALB2, PDGFRA, PHOX2B, PMS2, POLD1, POLE, POT1, PRKAR1A, PTCH1, PTEN, RAD50, RAD51C, RAD51D, RB1, RECQL4, RET, RNF43, RUNX1, SDHAF2, SDHA (sequence changes only), SDHB, SDHC, SDHD, SMAD4, SMARCA4, SMARCB1, SMARCE1, STK11, SUFU, TERC, TERT, TMEM127, TP53, TSC1, TSC2, VHL, WRN and WT1.     12/30/2018 -  Chemotherapy   The patient had DOXOrubicin (ADRIAMYCIN) chemo injection 102 mg, 60 mg/m2 = 102 mg, Intravenous,  Once, 4 of 4 cycles Dose modification: 50 mg/m2 (original dose 60 mg/m2, Cycle 3, Reason: Dose not tolerated) Administration: 102 mg (12/30/2018), 102 mg (01/15/2019), 86 mg (01/28/2019), 86 mg (02/11/2019) palonosetron (ALOXI) injection 0.25 mg, 0.25 mg, Intravenous,  Once, 6 of 8 cycles Administration: 0.25 mg (12/30/2018), 0.25 mg (02/26/2019), 0.25 mg (01/15/2019), 0.25 mg (01/28/2019), 0.25 mg (02/11/2019), 0.25 mg (03/18/2019) ondansetron (ZOFRAN) injection 8 mg, 8 mg (100 % of original dose 8 mg), Intravenous,  Once, 2 of 4 cycles Dose modification: 8 mg (original dose 8 mg, Cycle 5) Administration: 8 mg (03/05/2019), 8 mg (03/12/2019), 8 mg (03/26/2019) pegfilgrastim-cbqv (UDENYCA) injection 6 mg, 6 mg, Subcutaneous, Once, 4 of 4 cycles Administration: 6 mg (01/01/2019), 6 mg (01/17/2019), 6 mg (01/30/2019), 6 mg (02/13/2019) CARBOplatin (PARAPLATIN) 600 mg in sodium chloride 0.9 % 250 mL chemo infusion, 600 mg (120.7 % of original dose 500.4 mg), Intravenous,  Once, 2 of 4 cycles Dose modification:   (original dose 500.4 mg, Cycle 5) Administration: 600 mg (02/26/2019), 600 mg (03/18/2019) cyclophosphamide (CYTOXAN) 1,020 mg in sodium chloride 0.9 % 250 mL chemo infusion, 600 mg/m2 = 1,020 mg, Intravenous,  Once, 4 of 4  cycles Dose modification: 500 mg/m2 (original dose 600 mg/m2,  Cycle 3, Reason: Dose not tolerated) Administration: 1,020 mg (12/30/2018), 1,020 mg (01/15/2019), 860 mg (01/28/2019), 860 mg (02/11/2019) PACLitaxel (TAXOL) 138 mg in sodium chloride 0.9 % 250 mL chemo infusion (</= '80mg'$ /m2), 80 mg/m2 = 138 mg, Intravenous,  Once, 2 of 4 cycles Dose modification: 65 mg/m2 (original dose 80 mg/m2, Cycle 6, Reason: Dose not tolerated) Administration: 138 mg (02/26/2019), 138 mg (03/05/2019), 138 mg (03/12/2019), 138 mg (03/18/2019), 138 mg (03/26/2019) fosaprepitant (EMEND) 150 mg, dexamethasone (DECADRON) 12 mg in sodium chloride 0.9 % 145 mL IVPB, , Intravenous,  Once, 6 of 8 cycles Administration:  (12/30/2018),  (02/26/2019),  (01/15/2019),  (01/28/2019),  (02/11/2019),  (03/18/2019)  for chemotherapy treatment.    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     CHIEF COMPLIANT: Follow-up s/p right mastectomy to review pathology   INTERVAL HISTORY: Kara Pittman is a 54 y.o. with above-mentioned history of triple negative right breast cancer who completed neoadjuvant chemotherapy. She underwent a right mastectomy on 04/21/19 with Dr. 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 She is a participant in the UpBeat clinical trial.She presents to the clinic todayto review the pathology report and discuss further treatment.  She has a lot of pain related to recent surgery.  She is very uncomfortable with the drain.  Her mother has been helping take care of her.  ALLERGIES:  has No Known Allergies.  MEDICATIONS:  Current Outpatient Medications  Medication Sig Dispense Refill  . acetaminophen (TYLENOL) 500 MG tablet Take 500-1,000 mg by mouth every 6 (six) hours as needed for mild pain or headache.    . b complex vitamins tablet Take 1 tablet by mouth at bedtime.    . bimatoprost (LATISSE) 0.03 %  ophthalmic solution Place 0.03 mLs into both eyes at bedtime. Place one drop on applicator and apply evenly along the skin of the upper eyelid at base of eyelashes once daily at bedtime; repeat procedure for second eye (use a clean applicator). 5 mL 3  . buPROPion (WELLBUTRIN XL) 150 MG 24 hr tablet Take 1 tablet by mouth daily (Patient taking differently: Take 150 mg by mouth in the morning. ) 90 tablet 3  . CALCIUM PO Take 1 tablet by mouth at bedtime.    . CYTOMEL 5 MCG tablet Take 1 tablet by mouth once daily (Patient taking differently: Take 5 mcg by mouth in the morning. ) 90 tablet 3  . docusate sodium (COLACE) 100 MG capsule Take 1 capsule (100 mg total) by mouth 2 (two) times daily. 20 capsule 0  . gabapentin (NEURONTIN) 100 MG capsule Take 1 capsule (100 mg total) by mouth 2 (two) times daily. 60 capsule 0  . lidocaine-prilocaine (EMLA) cream Apply to affected area once 30 g 3  . LORazepam (ATIVAN) 0.5 MG tablet Take 1 tablet (0.5 mg total) by mouth at bedtime as needed for sleep. 30 tablet 0  . methocarbamol (ROBAXIN) 500 MG tablet Take 1 tablet (500 mg total) by mouth every 6 (six) hours as needed for muscle spasms. 20 tablet 0  . Multiple Vitamins-Calcium (ONE-A-DAY WOMENS FORMULA) TABS Take 1 tablet by mouth at bedtime.    . ondansetron (ZOFRAN-ODT) 8 MG disintegrating tablet Take 1 tablet (8 mg total) by mouth every 8 (eight) hours as needed for nausea or vomiting. (Patient taking differently:  Take 8 mg by mouth every 8 (eight) hours as needed for nausea or vomiting (DISSOLVE ORALLY). ) 20 tablet 3  . oxyCODONE (OXY IR/ROXICODONE) 5 MG immediate release tablet Take 1-2 tablets (5-10 mg total) by mouth every 4 (four) hours as needed for moderate pain. 30 tablet 0  . pantoprazole (PROTONIX) 40 MG tablet TAKE 1 TABLET BY MOUTH ONCE DAILY 30 tablet 0  . prochlorperazine (COMPAZINE) 10 MG tablet TAKE 1 TABLET BY MOUTH EVERY 6 HOURS AS NEEDED FOR NAUSEA & VOMITING 30 tablet 1  . promethazine  (PHENERGAN) 25 MG suppository Place 1 suppository (25 mg total) rectally every 6 (six) hours as needed for nausea or vomiting. 12 each 1  . SYNTHROID 75 MCG tablet Take 1 tablet by mouth once daily (Patient taking differently: Take 75 mcg by mouth daily before breakfast. ) 90 tablet 3   No current facility-administered medications for this visit.    PHYSICAL EXAMINATION: ECOG PERFORMANCE STATUS: 1 - Symptomatic but completely ambulatory  Vitals:   04/29/19 1132  BP: 125/89  Pulse: 99  Resp: 18  Temp: 98.5 F (36.9 C)  SpO2: 100%   Filed Weights   04/29/19 1132  Weight: 155 lb 14.4 oz (70.7 kg)    LABORATORY DATA:  I have reviewed the data as listed CMP Latest Ref Rng & Units 04/09/2019 04/02/2019 03/26/2019  Glucose 70 - 99 mg/dL 95 93 87  BUN 6 - 20 mg/dL _0 Creatinine 0.44 - 1.00 mg/dL 0.75 0.86 0.74  Sodium 135 - 145 mmol/L 141 144 140  Potassium 3.5 - 5.1 mmol/L 4.1 4.3 4.1  Chloride 98 - 111 mmol/L 108 108 108  CO2 22 - 32 mmol/L _1 Calcium 8.9 - 10.3 mg/dL 9.1 9.4 8.9  Total Protein 6.5 - 8.1 g/dL 7.1 7.0 6.7  Total Bilirubin 0.3 - 1.2 mg/dL <0.2(L) 0.3 0.2(L)  Alkaline Phos 38 - 126 U/L 83 79 66  AST 15 - 41 U/L _2 ALT 0 - 44 U/L 23 48(H) 31    Lab Results  Component Value Date   WBC 6.7 04/22/2019   HGB 10.6 (L) 04/22/2019   HCT 32.3 (L) 04/22/2019   MCV 100.3 (H) 04/22/2019   PLT 331 04/22/2019   NEUTROABS 1.7 04/09/2019    ASSESSMENT & PLAN:  Malignant neoplasm of lower-inner quadrant of right breast of female, estrogen receptor negative ([place]) 12/12/2018:Patient palpated right breast mass. Mammogram showed a 3.3cm solid and cystic right breast mass, 6 o'clock position, no abnormal right axillary lymph nodes. 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Neoadjuvant chemotherapy with dose dense Adriamycin and Cytoxan x4 followed by Taxol  weekly x6 discontinued because of worsening breast symptoms on 03/26/2019 2.Mastectomy with sentinel lymph node biopsy 04/21/2019: 7 cm metaplastic carcinoma, clear margins, 4 lymph nodes negative 3.adjuvant radiation Upbeat clinical trial: No adverse effects due to participation in the study ----------------------------------------------------------------------------------------------------------------------------------------------------- Right mastectomy: 7 cm of metaplastic carcinoma grade 2, margins uninvolved, tumor directly invades the dermis 0/4 lymph nodes negative.  Triple negative with a Ki-67 of 15%  Pathology counseling: I discussed the final pathology report of the patient provided  a copy of this report. I discussed the margins as well as lymph node surgeries. We also discussed the final staging along with previously performed ER/PR and HER-2/neu testing.  Plan: 1.  Patient will benefit from adjuvant radiation 2.  We will consider evaluation in the immunotherapy clinical trial.  SWOG [phone]. Patient is eligible to participate in this clinical trial. Return to clinic after radiation is complete.    Orders Placed This Encounter  Procedures  . CT Abdomen Pelvis W Contrast    Standing Status:   Future    Standing Expiration Date:   04/28/2020    Order Specific Question:   ** REASON FOR EXAM (FREE TEXT)    Answer:   Neck and back pain with H/O breast cancer    Order Specific Question:   If indicated for the ordered procedure, I authorize the administration of contrast media per Radiology protocol    Answer:   Yes    Order Specific Question:   Is patient pregnant?    Answer:   No    Order Specific Question:   Preferred imaging location?    Answer:   [facility]    Order Specific Question:   Release to patient    Answer:   Immediate    Order Specific Question:   Is Oral Contrast requested for this exam?    Answer:   Yes, Per Radiology protocol    Order Specific  Question:   Radiology Contrast Protocol - do NOT remove file path    Answer:   \\[email or web address].[email or web address]  . CT Chest W Contrast    Standing Status:   Future    Standing Expiration Date:   04/28/2020    Order Specific Question:   ** REASON FOR EXAM (FREE TEXT)    Answer:   Neck and back pain with H/O breast cancer    Order Specific Question:   If indicated for the ordered procedure, I authorize the administration of contrast media per Radiology protocol    Answer:   Yes    Order Specific Question:   Is patient pregnant?    Answer:   No    Order Specific Question:   Preferred imaging location?    Answer:   [facility]    Order Specific Question:   Release to patient    Answer:   Immediate    Order Specific Question:   Radiology Contrast Protocol - do NOT remove file path    Answer:   \\[email or web address]   The patient has a good understanding of the overall plan. she agrees with it. she will call with any problems that may develop before the next visit here.  Total time spent: 30 mins including face to face time and time spent for planning, charting and coordination of care  [name], MD 04/29/2019  I, Cloyde Reams Dorshimer, am acting as scribe for Dr. [name].  I have reviewed the above documentation for accuracy and completeness, and I agree with the above.

## 2019-04-29 ENCOUNTER — Other Ambulatory Visit: Payer: Self-pay

## 2019-04-29 ENCOUNTER — Other Ambulatory Visit: Payer: Self-pay | Admitting: *Deleted

## 2019-04-29 ENCOUNTER — Inpatient Hospital Stay (HOSPITAL_BASED_OUTPATIENT_CLINIC_OR_DEPARTMENT_OTHER): Payer: Medicaid Other | Admitting: Hematology and Oncology

## 2019-04-29 ENCOUNTER — Encounter: Payer: Self-pay | Admitting: *Deleted

## 2019-04-29 DIAGNOSIS — Z171 Estrogen receptor negative status [ER-]: Secondary | ICD-10-CM

## 2019-04-29 DIAGNOSIS — C50311 Malignant neoplasm of lower-inner quadrant of right female breast: Secondary | ICD-10-CM | POA: Diagnosis not present

## 2019-04-29 MED FILL — PANTOPRAZOLE SOD DR 40 MG T: 40 | 30 days supply | Qty: 30 | Fill #0

## 2019-04-29 NOTE — Assessment & Plan Note (Signed)
12/12/2018:Patient palpated right breast mass. Mammogram showed a 3.3cm solid and cystic right breast mass, 6 o'clock position, no abnormal right axillary lymph nodes. 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Neoadjuvant chemotherapy with dose dense Adriamycin and Cytoxan x4 followed by Taxol weekly x6 discontinued because of worsening breast symptoms on 03/26/2019 2.Mastectomy with sentinel lymph node biopsy 04/21/2019: 7 cm metaplastic carcinoma, clear margins, 4 lymph nodes negative 3.adjuvant radiation Upbeat clinical trial: No adverse effects due to participation in the study ----------------------------------------------------------------------------------------------------------------------------------------------------- Right mastectomy: 7 cm of metaplastic carcinoma grade 2, margins uninvolved, tumor directly invades the dermis 0/4 lymph nodes negative.  Triple negative with a Ki-67 of 15%  Pathology counseling: I discussed the final pathology report of the patient provided  a copy of this report. I discussed the margins as well as lymph node surgeries. We also discussed the final staging along with previously performed ER/PR and HER-2/neu testing.  Plan: 1.  Patient will benefit from adjuvant radiation 2.  We will consider evaluation in the immunotherapy clinical trial.  SWOG [phone].

## 2019-04-30 MED FILL — METHOCARBAMOL 500 MG TABS: 500 | 10 days supply | Qty: 30 | Fill #0

## 2019-05-01 ENCOUNTER — Telehealth: Payer: Self-pay | Admitting: *Deleted

## 2019-05-01 DIAGNOSIS — E039 Hypothyroidism, unspecified: Secondary | ICD-10-CM

## 2019-05-01 MED FILL — GABAPENTIN 100 MG CAPSULE: 100 | 8 days supply | Qty: 100 | Fill #0

## 2019-05-01 MED FILL — oxyCODONE HCL 5 MG TABS: 5 | 6 days supply | Qty: 25 | Fill #0

## 2019-05-01 NOTE — Telephone Encounter (Signed)
Received fax requesting a Rx be sent in for Cytomel 5 mcg tablets.  It states that patient now has [place] Medicaid and that Rx must have the words "Brand Name Medically Necessary." Pharmacy is [facility].  Seaborn Nakama Zimmerman Rumple, CMA

## 2019-05-04 MED ORDER — CYTOMEL 5 MCG PO TABS: 5.0000 ug | ORAL_TABLET | Freq: Every morning | ORAL | 3 refills | Status: DC

## 2019-05-04 MED FILL — CYTOMEL 5 MCG TABLET: 5 | 90 days supply | Qty: 90 | Fill #0

## 2019-05-04 NOTE — Telephone Encounter (Signed)
Rx sent as requested.

## 2019-05-05 ENCOUNTER — Encounter: Payer: Self-pay | Admitting: *Deleted

## 2019-05-07 ENCOUNTER — Telehealth: Payer: Self-pay | Admitting: Medical Oncology

## 2019-05-07 NOTE — Telephone Encounter (Signed)
UPBEAT: 3 month assessment LVMOM with patient regarding her three month assessment for study has been due. Informed patient called once before and left message, and no return call from her. Patient was informed that this assessment has the study cardiac MRI. Inquired with patient since she will be in clinic on the 7th and the 12th of this month,  if either of these days would work for her to complete the assessments. Patient thanked and was asked to return call.

## 2019-05-13 ENCOUNTER — Ambulatory Visit (HOSPITAL_COMMUNITY)
Admission: RE | Admit: 2019-05-13 | Discharge: 2019-05-13 | Disposition: A | Discharge status: Home / Self Care | Payer: Medicaid Other | Source: Ambulatory Visit | Attending: Hematology and Oncology | Admitting: Hematology and Oncology

## 2019-05-13 ENCOUNTER — Other Ambulatory Visit: Payer: Self-pay

## 2019-05-13 DIAGNOSIS — Z171 Estrogen receptor negative status [ER-]: Secondary | ICD-10-CM

## 2019-05-13 DIAGNOSIS — C50311 Malignant neoplasm of lower-inner quadrant of right female breast: Secondary | ICD-10-CM | POA: Insufficient documentation

## 2019-05-13 DIAGNOSIS — C50919 Malignant neoplasm of unspecified site of unspecified female breast: Secondary | ICD-10-CM | POA: Diagnosis not present

## 2019-05-13 IMAGING — CT CT ABD-PELV W/ CM
2 of 5 series · 13 of 36 positions shown, 16 images · IV contrast (OMNIPAQUE)
Comparison: None.

CLINICAL DATA: Breast cancer staging. Prior mastectomy and adjuvant
chemotherapy.

EXAM:
CT CHEST, ABDOMEN, AND PELVIS WITH CONTRAST
TECHNIQUE: Multidetector CT imaging of the chest, abdomen and pelvis was
performed following the standard protocol during bolus
administration of intravenous contrast.
CONTRAST:  100mL OMNIPAQUE IOHEXOL 300 MG/ML  SOLN

[Series 2: cap with · axial · 0.76mm/px · z∈[-308,+217]mm · 10 of 129 slices shown, 13 images]
[im 12/129  mediastinal]
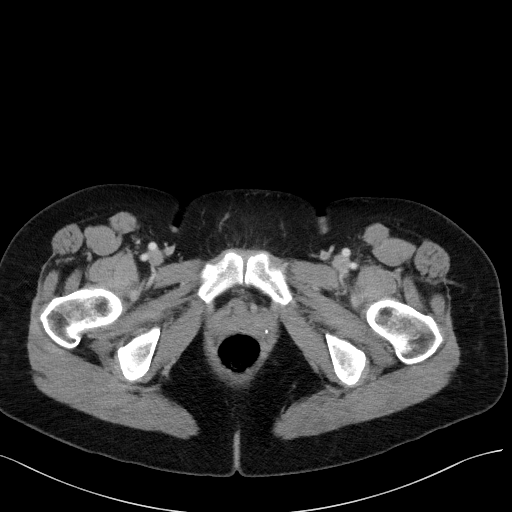
[im 12/129  lung]
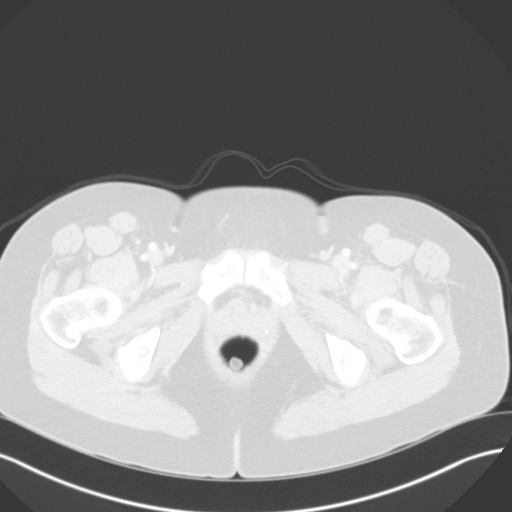
[im 24/129  lung]
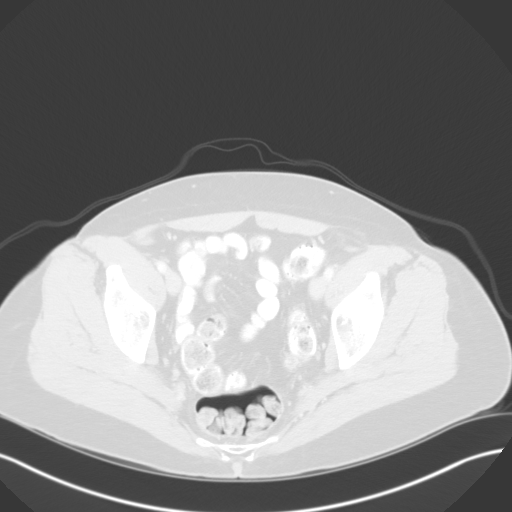
[im 35/129  lung]
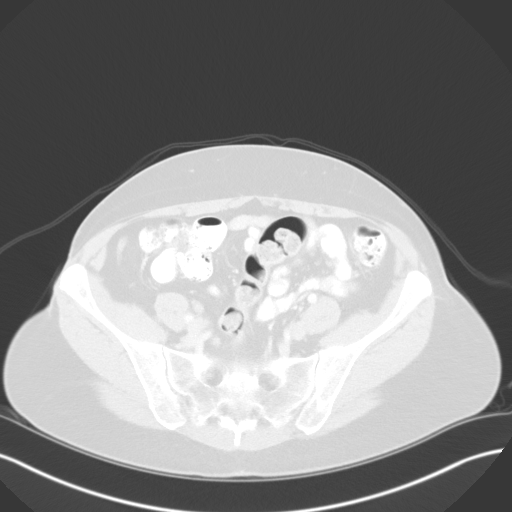
[im 47/129  lung]
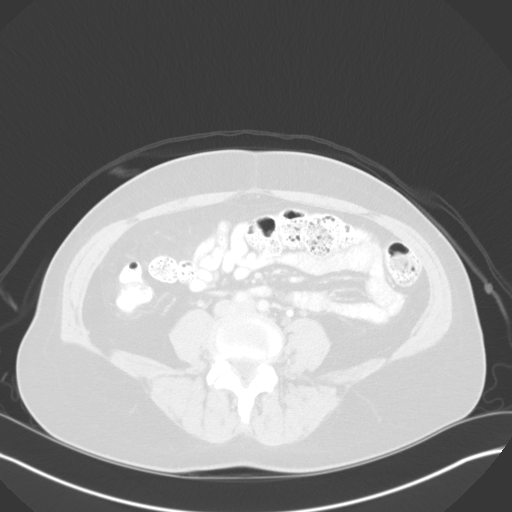
[im 59/129  mediastinal]
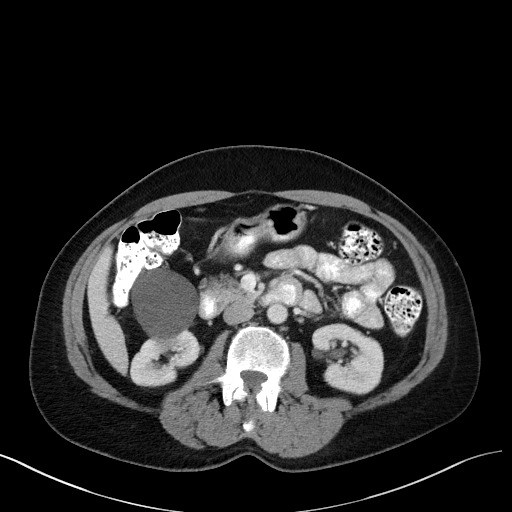
[im 59/129  lung]
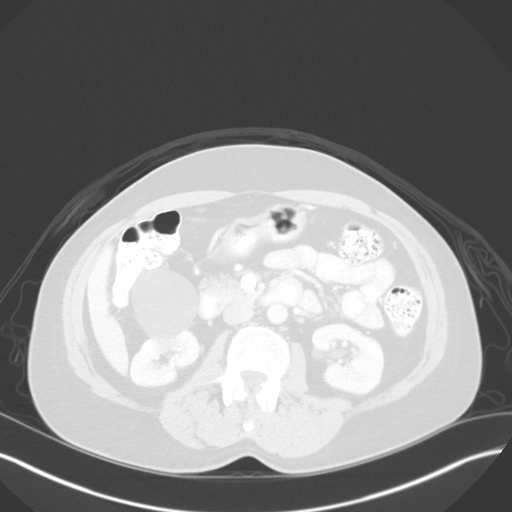
[im 70/129  lung]
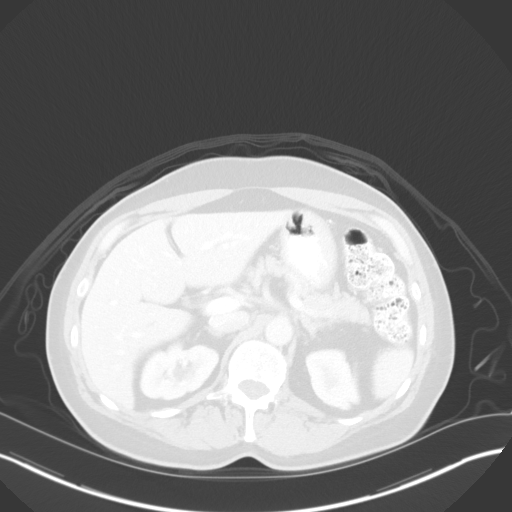
[im 82/129  lung]
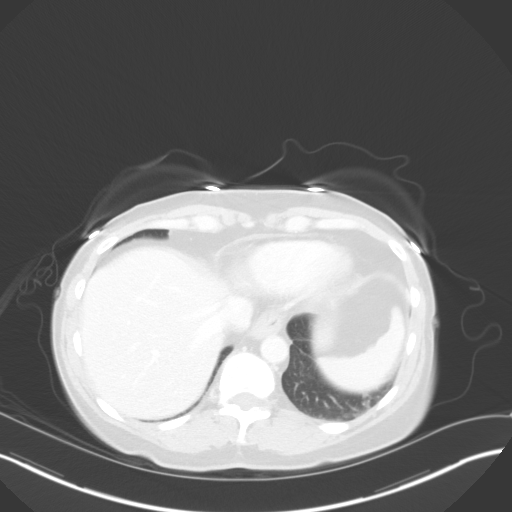
[im 94/129  lung]
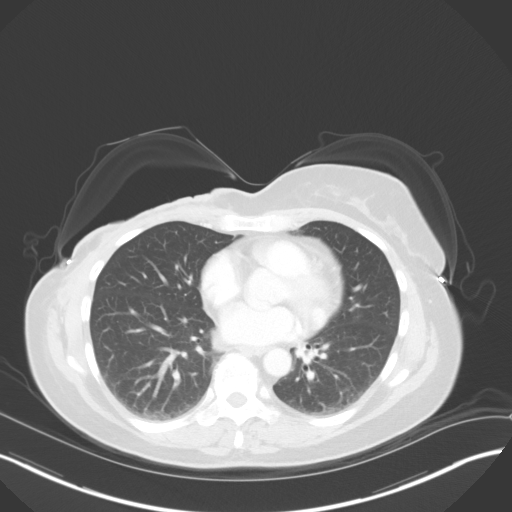
[im 105/129  mediastinal]
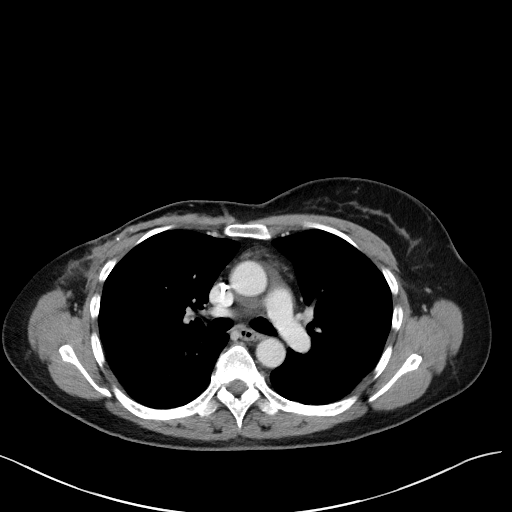
[im 105/129  lung]
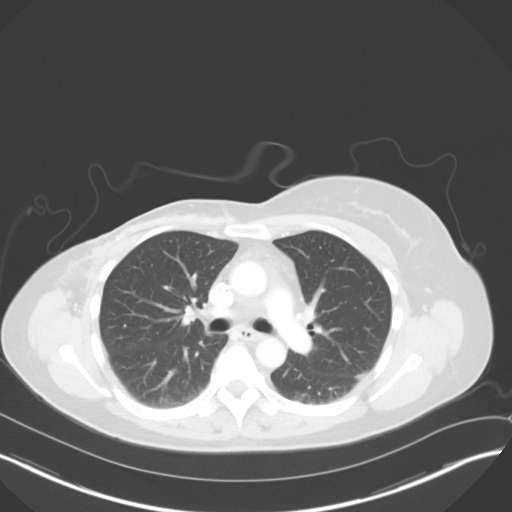
[im 117/129  lung]
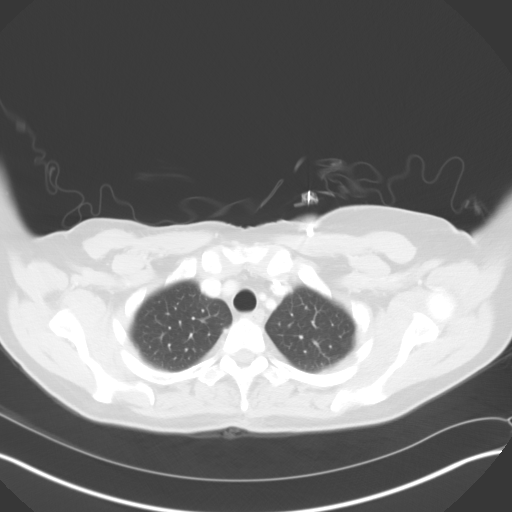

[Series 4: coronals · coronal · 0.90mm/px · 3 of 107 slices shown]
[im 22/107  lung]
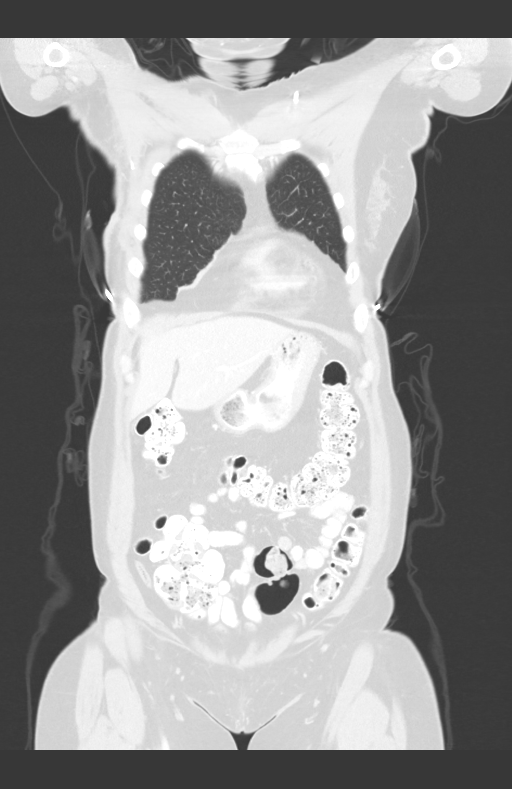
[im 43/107  lung]
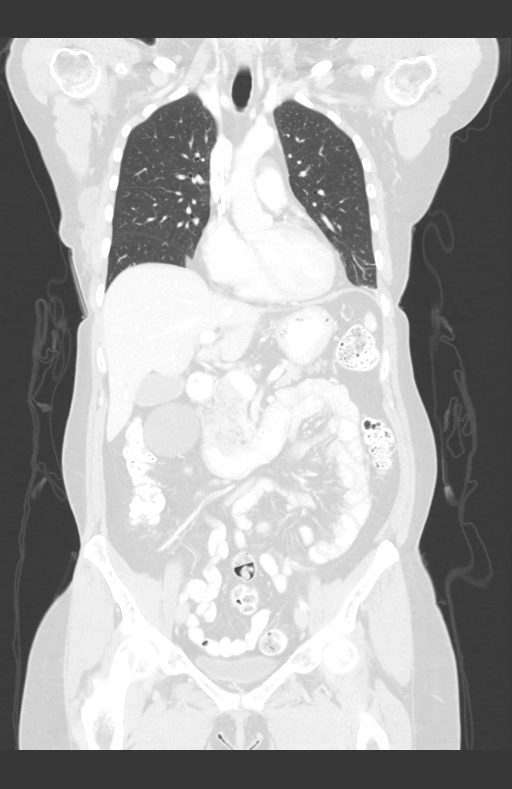
[im 64/107  lung]
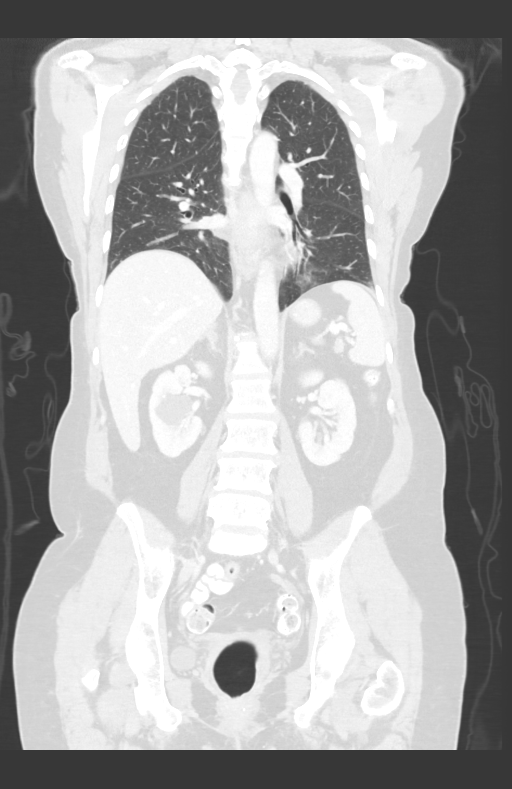

[13 of 36 positions shown; findings below may reference images not displayed]

FINDINGS: CT CHEST FINDINGS

Cardiovascular: Port in the anterior chest wall with tip in distal
SVC. No significant vascular findings. Normal heart size. No
pericardial effusion.

Mediastinum/Nodes: Seroma in the RIGHT axilla measuring 3.5 x 1.4 cm
(image [DATE]) at lymphadenectomy site. Post RIGHT mastectomy.

No supraclavicular adenopathy. No mediastinal adenopathy. No
internal mammary adenopathy. No hilar adenopathy.

Lungs/Pleura: No suspicious pulmonary nodules.  Airways normal.

Musculoskeletal: No aggressive osseous lesion.

CT ABDOMEN AND PELVIS FINDINGS

Hepatobiliary: Low-density lesion in the central LEFT hepatic lobe
has simple fluid attenuation consistent benign cysts. Gallbladder
normal. Normal biliary tree

Pancreas: Pancreas is normal. No ductal dilatation. No pancreatic
inflammation.

Spleen: Normal spleen

Adrenals/urinary tract: Bilateral simple fluid renal cysts. Large
cyst extends from the RIGHT kidney measuring 5 cm. Small
nonobstructing 4 mm calculus in lower pole of the RIGHT kidney.
Ureters and bladder normal.

Stomach/Bowel: Stomach, small bowel, appendix, and cecum are normal.
The colon and rectosigmoid colon are normal.

Vascular/Lymphatic: Abdominal aorta is normal caliber. There is no
retroperitoneal or periportal lymphadenopathy. No pelvic
lymphadenopathy.

Reproductive: Post hysterectomy

Other: No peritoneal nodularity

Musculoskeletal: No aggressive osseous lesion. Benign-appearing
sclerotic lesion the posterior LEFT iliac bone measures 6 mm.
IMPRESSION: Chest Impression:

1. Postsurgical change consistent RIGHT mastectomy and axillary
sentinel node dissection.
2. No evidence of metastatic disease in the thorax.

Abdomen / Pelvis Impression:

1. No evidence of metastatic disease the abdomen pelvis.
2. No skeletal metastasis.

## 2019-05-13 IMAGING — CT CT CHEST W/ CM
2 of 5 series · 13 of 36 positions shown, 16 images · IV contrast (OMNIPAQUE)
Comparison: None.

CLINICAL DATA: Breast cancer staging. Prior mastectomy and adjuvant
chemotherapy.

EXAM:
CT CHEST, ABDOMEN, AND PELVIS WITH CONTRAST
TECHNIQUE: Multidetector CT imaging of the chest, abdomen and pelvis was
performed following the standard protocol during bolus
administration of intravenous contrast.
CONTRAST:  100mL OMNIPAQUE IOHEXOL 300 MG/ML  SOLN

[Series 2: cap with · axial · 0.76mm/px · z∈[-308,+217]mm · 10 of 129 slices shown, 13 images]
[im 12/129  mediastinal]
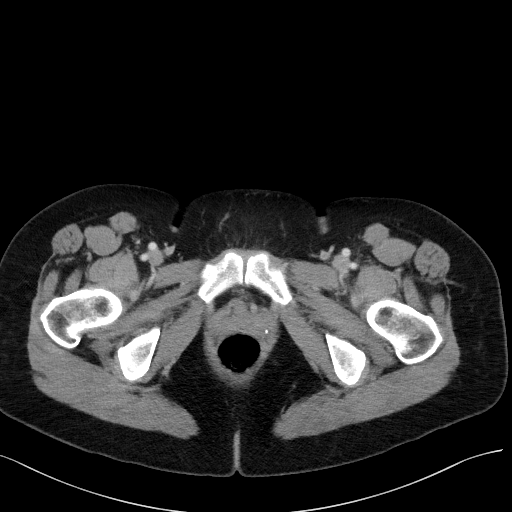
[im 12/129  lung]
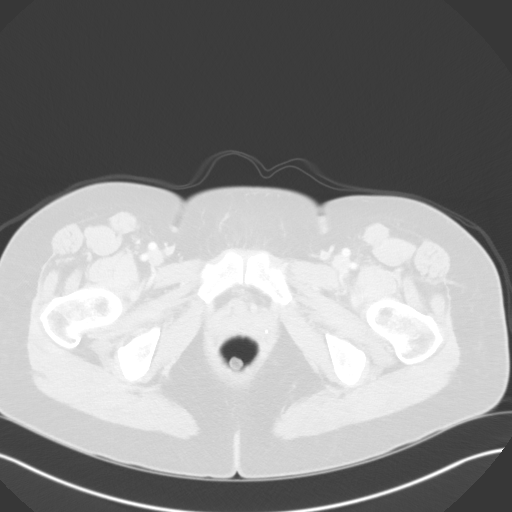
[im 24/129  lung]
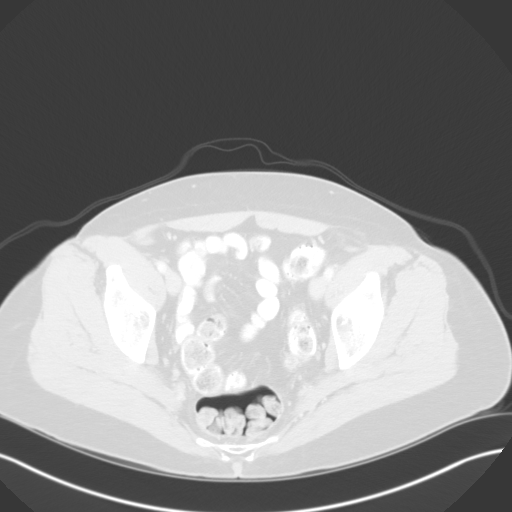
[im 35/129  lung]
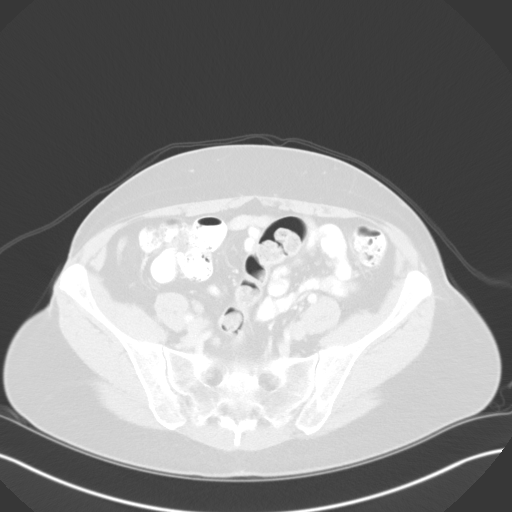
[im 47/129  lung]
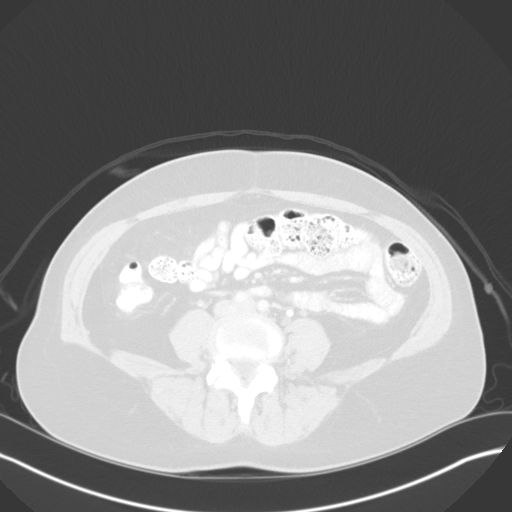
[im 59/129  mediastinal]
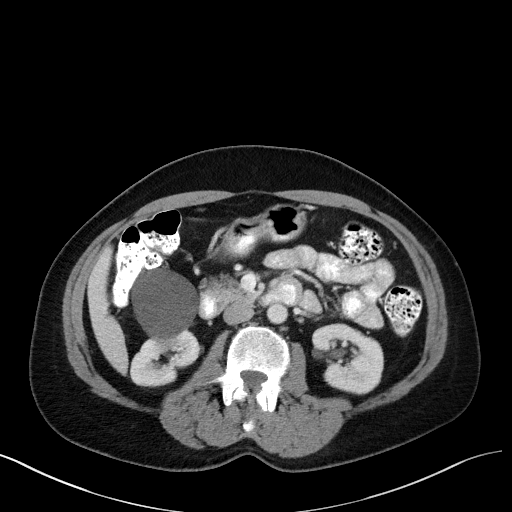
[im 59/129  lung]
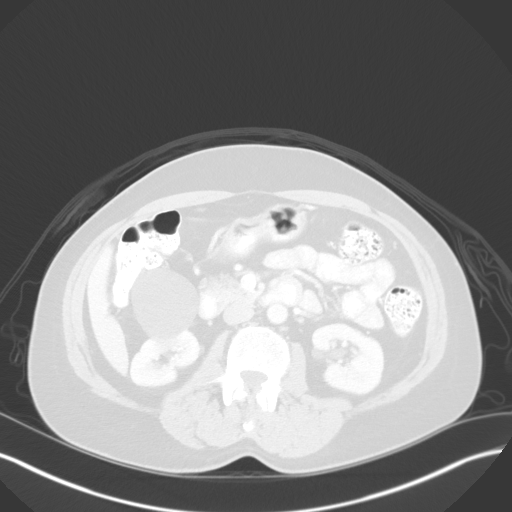
[im 70/129  lung]
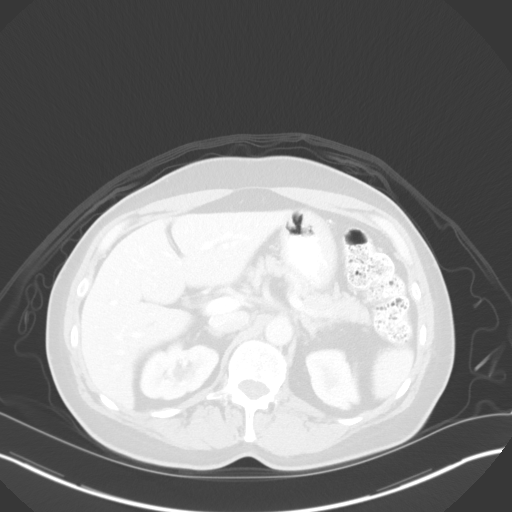
[im 82/129  lung]
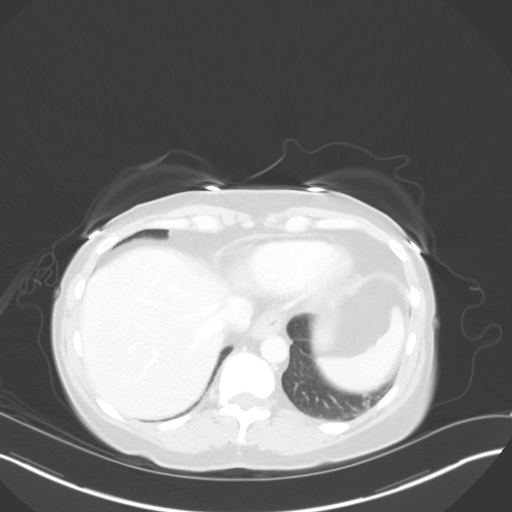
[im 94/129  lung]
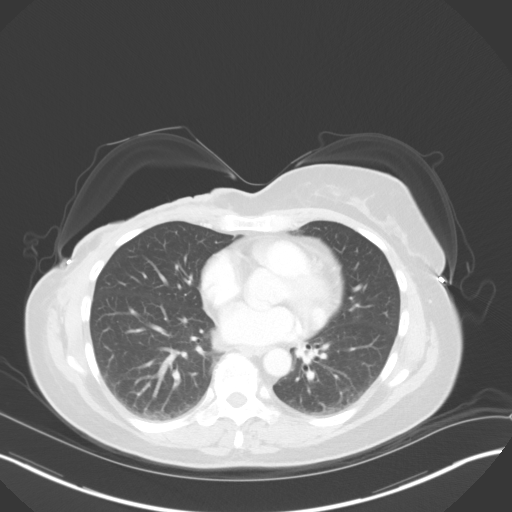
[im 105/129  mediastinal]
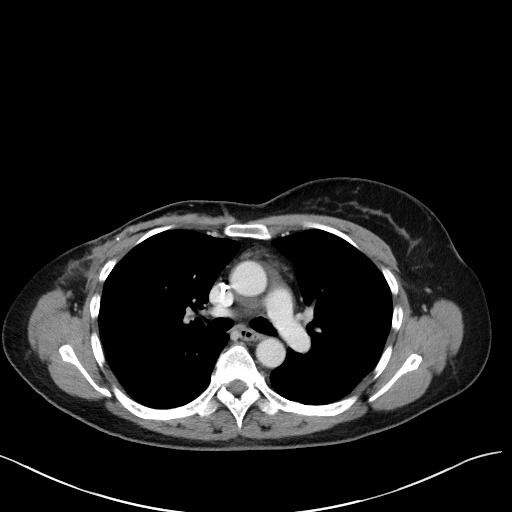
[im 105/129  lung]
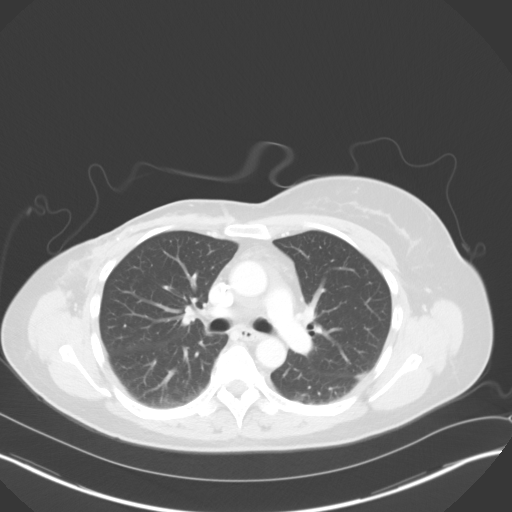
[im 117/129  lung]
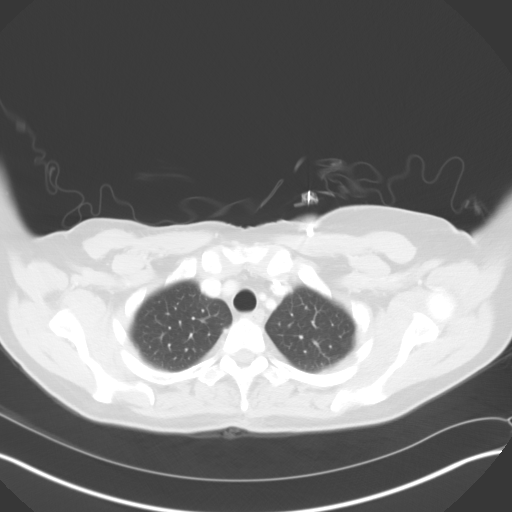

[Series 4: coronals · coronal · 0.90mm/px · 3 of 107 slices shown]
[im 22/107  lung]
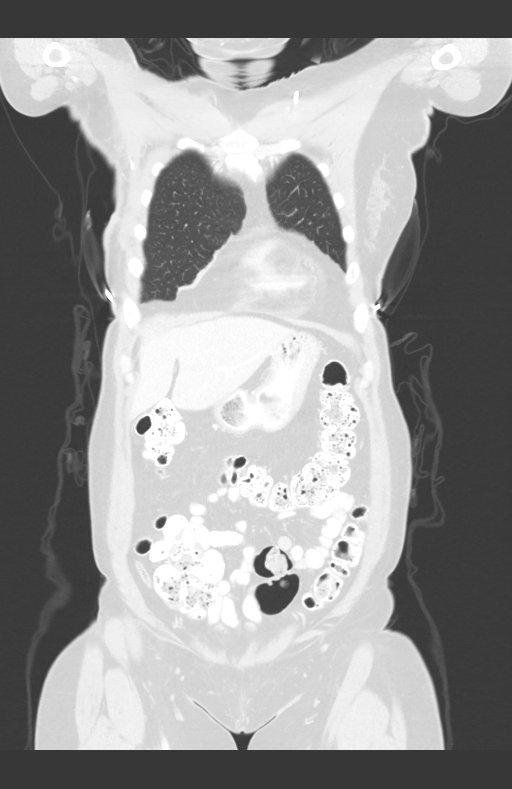
[im 43/107  lung]
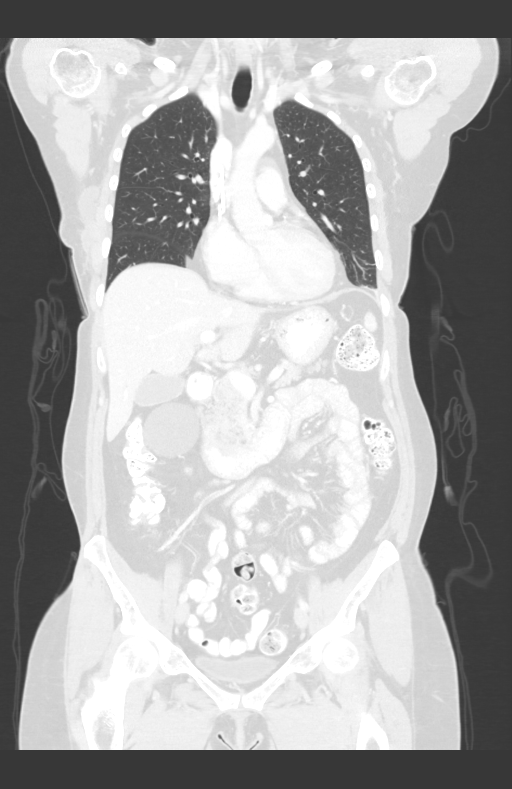
[im 64/107  lung]
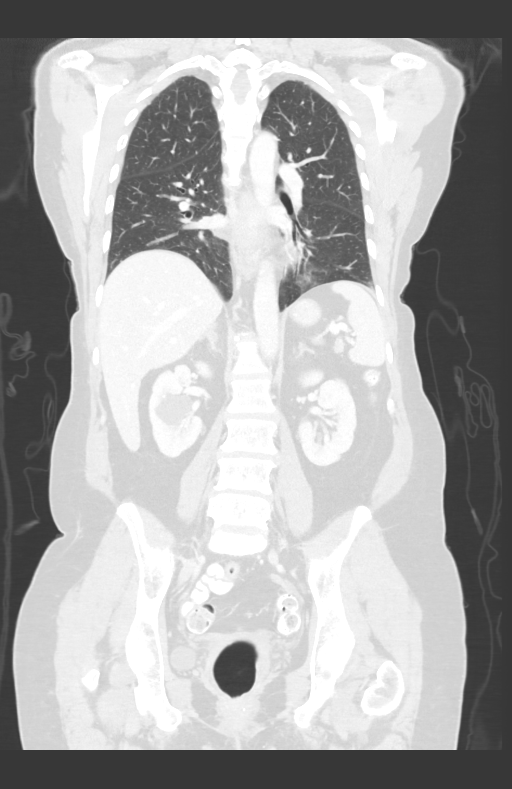

[13 of 36 positions shown; findings below may reference images not displayed]

FINDINGS: CT CHEST FINDINGS

Cardiovascular: Port in the anterior chest wall with tip in distal
SVC. No significant vascular findings. Normal heart size. No
pericardial effusion.

Mediastinum/Nodes: Seroma in the RIGHT axilla measuring 3.5 x 1.4 cm
(image [DATE]) at lymphadenectomy site. Post RIGHT mastectomy.

No supraclavicular adenopathy. No mediastinal adenopathy. No
internal mammary adenopathy. No hilar adenopathy.

Lungs/Pleura: No suspicious pulmonary nodules.  Airways normal.

Musculoskeletal: No aggressive osseous lesion.

CT ABDOMEN AND PELVIS FINDINGS

Hepatobiliary: Low-density lesion in the central LEFT hepatic lobe
has simple fluid attenuation consistent benign cysts. Gallbladder
normal. Normal biliary tree

Pancreas: Pancreas is normal. No ductal dilatation. No pancreatic
inflammation.

Spleen: Normal spleen

Adrenals/urinary tract: Bilateral simple fluid renal cysts. Large
cyst extends from the RIGHT kidney measuring 5 cm. Small
nonobstructing 4 mm calculus in lower pole of the RIGHT kidney.
Ureters and bladder normal.

Stomach/Bowel: Stomach, small bowel, appendix, and cecum are normal.
The colon and rectosigmoid colon are normal.

Vascular/Lymphatic: Abdominal aorta is normal caliber. There is no
retroperitoneal or periportal lymphadenopathy. No pelvic
lymphadenopathy.

Reproductive: Post hysterectomy

Other: No peritoneal nodularity

Musculoskeletal: No aggressive osseous lesion. Benign-appearing
sclerotic lesion the posterior LEFT iliac bone measures 6 mm.
IMPRESSION: Chest Impression:

1. Postsurgical change consistent RIGHT mastectomy and axillary
sentinel node dissection.
2. No evidence of metastatic disease in the thorax.

Abdomen / Pelvis Impression:

1. No evidence of metastatic disease the abdomen pelvis.
2. No skeletal metastasis.

## 2019-05-13 MED ORDER — HEPARIN SOD (PORK) LOCK FLUSH 100 UNIT/ML IV SOLN
INTRAVENOUS | Status: AC
  Filled 2019-05-13: qty 5

## 2019-05-13 MED ORDER — HEPARIN SOD (PORK) LOCK FLUSH 100 UNIT/ML IV SOLN
500.0000 [IU] | Freq: Once | INTRAVENOUS | Status: AC
  Administered 2019-05-13: 500 [IU] via INTRAVENOUS

## 2019-05-13 MED ORDER — SODIUM CHLORIDE (PF) 0.9 % IJ SOLN
INTRAMUSCULAR | Status: AC
  Filled 2019-05-13: qty 50

## 2019-05-13 MED ORDER — IOHEXOL 300 MG/ML  SOLN
100.0000 mL | Freq: Once | INTRAMUSCULAR | Status: AC | PRN
  Administered 2019-05-13: 100 mL via INTRAVENOUS

## 2019-05-15 ENCOUNTER — Telehealth: Payer: Self-pay | Admitting: Hematology and Oncology

## 2019-05-15 ENCOUNTER — Telehealth: Payer: Self-pay | Admitting: Medical Oncology

## 2019-05-15 DIAGNOSIS — C50311 Malignant neoplasm of lower-inner quadrant of right female breast: Secondary | ICD-10-CM

## 2019-05-15 DIAGNOSIS — Z171 Estrogen receptor negative status [ER-]: Secondary | ICD-10-CM

## 2019-05-15 NOTE — Progress Notes (Signed)
Location of Breast Cancer: Malignant neoplasm of lower-inner quadrant of right breast of female, estrogen receptor negative   Histology per Pathology Report: 12/08/18: Diagnosis Breast, right, needle core biopsy, 5:30 o'clock - INVASIVE CARCINOMA WITH METAPLASTIC FEATURES, SEE COMMENT. Microscopic Comment There are areas of epithelial appearing malignant cells with surrounding atypical stroma  04/21/19: Clinical History: right breast cancer (cm)    FINAL MICROSCOPIC DIAGNOSIS:   A. SKIN, MASTECTOMY INCISION #1, BIOPSY:  - Benign skin and subcutaneous tissue with patchy chronic inflammation  - No malignancy identified   B. SKIN, MASTECTOMY INCISION #2, BIOPSY:  - Benign skin and subcutaneous tissue with patchy chronic inflammation  - No malignancy identified   C. SKIN, MASTECTOMY INCISION #3, BIOPSY:  - Benign skin and subcutaneous tissue with patchy chronic inflammation  - No malignancy identified   D. SKIN, MASTECTOMY INCISION #4, BIOPSY:  - Benign skin and subcutaneous tissue with patchy chronic inflammation  - No malignancy identified   E. SKIN, MASTECTOMY INCISION #5, BIOPSY:  - Benign skin and subcutaneous tissue with patchy chronic inflammation  - No malignancy identified   F. LYMPH NODE, RIGHT AXILLARY #1, SENTINEL, BIOPSY:  - No carcinoma identified in one lymphnode (0/1)   G. LYMPH NODE, RIGHT AXILLARY #2, SENTINEL, BIOPSY:  - No carcinoma identified in one lymph node (0/1)   H. LYMPH NODE, RIGHT AXILLARY #3, SENTINEL, BIOPSY:  - No carcinoma identified in one lymph node (0/1)   Receptor Status: ER(0%), PR (0%), Her2-neu (negative), Ki-(15%)  Did patient present with symptoms (if so, please note symptoms) or was this found on screening mammography?: 12/12/2018:Patient palpated right breast mass. Mammogram showed a 3.3cm solid and cystic right breast mass, 6 o'clock position, no abnormal right axillary lymph nodes. Biopsy showed invasive carcinoma with  metaplastic features, HER-2 - (0), ER/PR -, Ki67 15%. T2 N0 stage IIb clinical stage  Past/Anticipated interventions by surgeon, if any: 04/21/19: Right Mastectomy with Sentinel Node Biopsy Procedure Note Surgeon: [facility]  Past/Anticipated interventions by medical oncology, if any: Chemotherapy Per Dr. Lindi Adie 04/29/19:  ASSESSMENT & PLAN:  Malignant neoplasm of lower-inner quadrant of right breast of female, estrogen receptor negative ([place]) 12/12/2018:Patient palpated right breast mass. Mammogram showed a 3.3cm solid and cystic right breast mass, 6 o'clock position, no abnormal right axillary lymph nodes. 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  Triple negative with a Ki-67 of 15%  Pathology counseling: I discussed the final pathology report of the patient provided  a copy of this report. I discussed the margins as well as lymph node surgeries. We also discussed the final staging along with previously performed ER/PR and HER-2/neu testing.  Plan: 1.  Patient will benefit from adjuvant radiation 2.  We will consider evaluation in the immunotherapy clinical trial.  SWOG [id number]. Patient is eligible to participate in this clinical  trial. Return to clinic after radiation is complete.  Lymphedema issues, if any:  Pt reports that axilla is painful and numb at the same time.  Pt with fair ROM.  Pain issues, if any:  Pt reports mastectomy still with pain, rates 3-4/10.   SAFETY ISSUES:  Prior radiation? no  Pacemaker/ICD? no  Possible current pregnancy?no  Is the patient on methotrexate? no  Current Complaints / other details:  Pt presents today for consult with Dr. Sondra Come for Radiation Oncology. Pt is accompanied by fiance.   BP 113/73 (BP Location: Right Arm, Patient Position: Sitting, Cuff Size: Normal)   Pulse (!) 101   Temp 98.3 F (36.8 C)   Resp 20   Ht _0  (1.626 m)   Wt 154 lb 9.6 oz (70.1 kg)   SpO2 100%   BMI 26.54 kg/m   Wt Readings from Last 3 Encounters:  05/18/19 154 lb 9.6 oz (70.1 kg)  04/29/19 155 lb 14.4 oz (70.7 kg)  04/21/19 154 lb (69.9 kg)       Loma Sousa, RN 05/18/2019,1:29 PM

## 2019-05-15 NOTE — Telephone Encounter (Signed)
UPBEAT: 3 month  I spoke with patient regarding assessments due to for her 3 month on study visit. I had tired to contact patient previously x2, with no return call. Today, patient and I discussed the study assessments and what assessments would be collected at this visit. Patient is aware that fasting labs not to be collected and she asked for this to be done in the morning and then completion of the neurocognitives, physical function test and questionnaires to follow. Patient was asked to fast for three hours prior to her lab appointment and to wear comfortable clothes and shoes for the physical function portion, to which patient gave her verbal understanding and confirmation. Patient did request to have the cardiac MRI on Monday as well. I informed her that at this late time and last minute notice they may not have an appointment available and patient voiced understanding to this. Patient requested to have her TSH level checked during this lab appointment, I informed patient that this would not be covered by the study, she gave her verbal understanding,  to being informed that her insurance will be billed for the TSH.  Patient was thanked for her time and encouraged to call with questions in the meantime.  Patient scheduled for 3 month assessments Monday, April 12th for lab plus flush at 0900 (per her request) with research appointment with me to follow.  I spoke with the MRI department after I finished call with patient, and per MRI, they have no availabilities for Monday. I will discuss other options and patient's availability when she is in clinic on Monday. [name], RN, BSN, [facility] Clinical Research 05/15/2019 3:32 PM

## 2019-05-15 NOTE — Telephone Encounter (Signed)
Informed her that scans are good Evaluating for SWOG S 1418

## 2019-05-18 ENCOUNTER — Ambulatory Visit
Admission: RE | Admit: 2019-05-18 | Discharge: 2019-05-18 | Disposition: A | Discharge status: Home / Self Care | Payer: Medicaid Other | Source: Ambulatory Visit | Attending: Radiation Oncology | Admitting: Radiation Oncology

## 2019-05-18 ENCOUNTER — Other Ambulatory Visit (HOSPITAL_COMMUNITY): Payer: Self-pay | Admitting: Hematology and Oncology

## 2019-05-18 ENCOUNTER — Inpatient Hospital Stay: Payer: BC Managed Care – PPO | Admitting: Medical Oncology

## 2019-05-18 ENCOUNTER — Encounter: Payer: Self-pay | Admitting: Medical Oncology

## 2019-05-18 ENCOUNTER — Inpatient Hospital Stay: Payer: BC Managed Care – PPO | Attending: Hematology and Oncology

## 2019-05-18 ENCOUNTER — Encounter: Payer: Self-pay | Admitting: Radiation Oncology

## 2019-05-18 ENCOUNTER — Other Ambulatory Visit: Payer: Self-pay

## 2019-05-18 ENCOUNTER — Inpatient Hospital Stay: Payer: BC Managed Care – PPO

## 2019-05-18 VITALS — BP 113/73 | HR 101 | Temp 98.3°F | Resp 20 | Ht 64.0 in | Wt 154.6 lb

## 2019-05-18 DIAGNOSIS — Z452 Encounter for adjustment and management of vascular access device: Secondary | ICD-10-CM | POA: Diagnosis not present

## 2019-05-18 DIAGNOSIS — C50311 Malignant neoplasm of lower-inner quadrant of right female breast: Secondary | ICD-10-CM

## 2019-05-18 DIAGNOSIS — Z9221 Personal history of antineoplastic chemotherapy: Secondary | ICD-10-CM | POA: Insufficient documentation

## 2019-05-18 DIAGNOSIS — Z79899 Other long term (current) drug therapy: Secondary | ICD-10-CM | POA: Diagnosis not present

## 2019-05-18 DIAGNOSIS — Z171 Estrogen receptor negative status [ER-]: Secondary | ICD-10-CM

## 2019-05-18 DIAGNOSIS — Z95828 Presence of other vascular implants and grafts: Secondary | ICD-10-CM

## 2019-05-18 DIAGNOSIS — Z9011 Acquired absence of right breast and nipple: Secondary | ICD-10-CM | POA: Insufficient documentation

## 2019-05-18 DIAGNOSIS — R200 Anesthesia of skin: Secondary | ICD-10-CM | POA: Diagnosis not present

## 2019-05-18 DIAGNOSIS — C50919 Malignant neoplasm of unspecified site of unspecified female breast: Secondary | ICD-10-CM

## 2019-05-18 LAB — TSH: TSH: <0.08 u[IU]/mL — ABNORMAL LOW (ref 0.308–3.960)

## 2019-05-18 LAB — RESEARCH LABS

## 2019-05-18 MED ORDER — SODIUM CHLORIDE 0.9% FLUSH
10.0000 mL | Freq: Once | INTRAVENOUS | Status: AC
  Administered 2019-05-18: 10 mL
  Filled 2019-05-18: qty 10

## 2019-05-18 MED ORDER — HEPARIN SOD (PORK) LOCK FLUSH 100 UNIT/ML IV SOLN
500.0000 [IU] | Freq: Once | INTRAVENOUS | Status: AC
  Administered 2019-05-18: 500 [IU]
  Filled 2019-05-18: qty 5

## 2019-05-18 NOTE — Patient Instructions (Signed)
Coronavirus (COVID-19) Are you at risk?  Are you at risk for the Coronavirus (COVID-19)?  To be considered HIGH RISK for Coronavirus (COVID-19), you have to meet the following criteria:  . Traveled to [place], [place], [place], [place] or [place]; or in the [place] to [place], [place], [place], or [place]; and have fever, cough, and shortness of breath within the last 2 weeks of travel OR . Been in close contact with a person diagnosed with COVID-19 within the last 2 weeks and have fever, cough, and shortness of breath . IF YOU DO NOT MEET THESE CRITERIA, YOU ARE CONSIDERED LOW RISK FOR COVID-19.  What to do if you are HIGH RISK for COVID-19?  . If you are having a medical emergency, call 911. . Seek medical care right away. Before you go to a doctor's office, urgent care or emergency department, call ahead and tell them about your recent travel, contact with someone diagnosed with COVID-19, and your symptoms. You should receive instructions from your physician's office regarding next steps of care.  . When you arrive at healthcare provider, tell the healthcare staff immediately you have returned from visiting [place], [place], [place], [place] or [place]; or traveled in the [place] to [place], [place], [place], or [place]; in the last two weeks or you have been in close contact with a person diagnosed with COVID-19 in the last 2 weeks.   . Tell the health care staff about your symptoms: fever, cough and shortness of breath. . After you have been seen by a medical provider, you will be either: o Tested for (COVID-19) and discharged home on quarantine except to seek medical care if symptoms worsen, and asked to  - Stay home and avoid contact with others until you get your results (4-5 days)  - Avoid travel on public transportation if possible (such as bus, train, or airplane) or o Sent to the Emergency Department by EMS for evaluation, COVID-19 testing, and possible  admission depending on your condition and test results.  What to do if you are LOW RISK for COVID-19?  Reduce your risk of any infection by using the same precautions used for avoiding the common cold or flu:  . Wash your hands often with soap and warm water for at least 20 seconds.  If soap and water are not readily available, use an alcohol-based hand sanitizer with at least 60% alcohol.  . If coughing or sneezing, cover your mouth and nose by coughing or sneezing into the elbow areas of your shirt or coat, into a tissue or into your sleeve (not your hands). . Avoid shaking hands with others and consider head nods or verbal greetings only. . Avoid touching your eyes, nose, or mouth with unwashed hands.  . Avoid close contact with people who are sick. . Avoid places or events with large numbers of people in one location, like concerts or sporting events. . Carefully consider travel plans you have or are making. . If you are planning any travel outside or inside the [place], visit the CDC's Travelers' Health webpage for the latest health notices. . If you have some symptoms but not all symptoms, continue to monitor at home and seek medical attention if your symptoms worsen. . If you are having a medical emergency, call 911.   ADDITIONAL HEALTHCARE OPTIONS FOR PATIENTS  [facility] / e-Visit: [email or web address]         [facility]: [phone]  [facility]: [phone]                   [facility]: [phone]   

## 2019-05-18 NOTE — Progress Notes (Signed)
Radiation Oncology         [phone] ________________________________  Name: Kara Pittman MRN: [id number]  Date: 05/18/2019  DOB: 19-Nov-1965  Re-Evaluation Note  CC: Andria Frames Jamal Collin, MD  [name], MD    ICD-10-CM   1. Malignant neoplasm of lower-inner quadrant of right breast of female, estrogen receptor negative ([place])  C50.311    Z17.1     Diagnosis: Stage IIB (pT4b, pN0) Right Breast LIQ, Invasive Metaplastic Carcinoma, ER- / PR- / Her2-, Grade 3  Narrative:  The patient returns today to discuss radiation treatment options. She was seen in the multidisciplinary breast clinic on 12/17/2018. At that time, it was recommended that the patient proceed with genetic testing, MRI, port insertion, neoadjuvant chemotherapy, surgery, and adjuvant radiation therapy.  She underwent genetic testing on 12/17/2018. Results were negative.  Echocardiogram on 12/22/2018 showed an EF of 60-65%.  MRI of bilateral breasts on 12/23/2018 showed a complex solid and cystic mass involving the majority of the lower outer quadrant of the right breast with associated non-mass enhancement extending into portions of the lower inner and upper outer quadrants. Overall measurements spanned 6.0 x 3.4 x 4.0 cm from anterior to posterior depth. The mass abutted and indented the underlying pectoralis muscle which demonstrated morphologic changes and subtle enhancement along the lateral portions, suggesting malignant involvement. There was also skin retraction with thickening and enhancement along the inferior right breast. Lymphovascular spread could not be excluded. No MRI evidence of malignancy on the left and no suspicious lymphadenopathy.  The patient underwent neoadjuvant chemotherapy with dose dense Adriamycin and Cytoxan x4 followed by Taxol weekly x6 under the care of Dr. Lindi Adie. Treated was started on 12/30/2018 but was discontinued on 03/26/2019 secondary to disease progression.  The patient underwent a  diagnostic right mammogram with tomography and ultrasonography on 03/20/2019 that showed marked interval increase in the size of the necrotic mass involving the lower and central right breast. No pathologic right axillary lymphadenopathy was seen.  MRI of bilateral breasts on 03/25/2019 showed two adjacent necrotic fluid collections in the inferior right breast corresponding with the patient's site of known malignancy. The overall size and morphology was similar when compared to the most recent ultrasound evaluation. While the size of the collection had increased compared to November 20202, the fluid itself did not enhance. There was improvement in peripheral enhancement surrounding the fluid collection, which had decreased in thickness and nodularity compared to November 2020. Additionally, surrounding non-mass parenchymal enhancement had resolved. The overall appearance suggested some response to chemotherapy with a decrease in malignant enhancement but increase in size of the non-enhancing fluid collection/necrosis. There was no MRI evidence of malignancy on the left and there was no suspicious lymphadenopathy.  The patient underwent right mastectomy with sentinel lymph node biopsy on 04/21/2019 performed by Dr. Barry Dienes. Pathology from the procedure showed metaplastic carcinoma of the right breast. Margins were uninvolved and no carcinoma was identified in the right lymph node. It also showed benign skin and subcutaneous tissue with patchy chronic inflammation without malignancy in all five mastectomy skin incision sites. Three right axillary sentinel nodes were biopsied and all were negative for carcinoma.  Tumor size at the time of her mastectomy had increased to 7 cm.  The margins were clear with the deep margin being close at less than 1 mm.  In addition the tumor did invade directly into the dermis/epidermis without skin ulceration  CT of chest/abdomen/pelvis on 05/13/2019 showed postsurgical change  consistent with right mastectomy  and axillary sentinel node dissection. No evidence of metastatic disease in the thorax, abdomen, or pelvis. No skeletal metastasis.  On review of systems, the patient reports pain and numbness in right axilla and pain at site of right mastectomy. She denies swelling in her right arm or hand and any other symptoms.    Allergies:  has No Known Allergies.  Meds: Current Outpatient Medications  Medication Sig Dispense Refill  . acetaminophen (TYLENOL) 500 MG tablet Take 500-1,000 mg by mouth every 6 (six) hours as needed for mild pain or headache.    . b complex vitamins tablet Take 1 tablet by mouth at bedtime.    . bimatoprost (LATISSE) 0.03 % ophthalmic solution Place 0.03 mLs into both eyes at bedtime. Place one drop on applicator and apply evenly along the skin of the upper eyelid at base of eyelashes once daily at bedtime; repeat procedure for second eye (use a clean applicator). 5 mL 3  . buPROPion (WELLBUTRIN XL) 150 MG 24 hr tablet Take 1 tablet by mouth daily (Patient taking differently: Take 150 mg by mouth in the morning. ) 90 tablet 3  . CALCIUM PO Take 1 tablet by mouth at bedtime.    . CYTOMEL 5 MCG tablet Take 1 tablet (5 mcg total) by mouth in the morning. Brand name medically necessary. 90 tablet 3  . docusate sodium (COLACE) 100 MG capsule Take 1 capsule (100 mg total) by mouth 2 (two) times daily. 20 capsule 0  . gabapentin (NEURONTIN) 100 MG capsule Take 1 capsule (100 mg total) by mouth 2 (two) times daily. 60 capsule 0  . lidocaine-prilocaine (EMLA) cream Apply to affected area once 30 g 3  . LORazepam (ATIVAN) 0.5 MG tablet Take 1 tablet (0.5 mg total) by mouth at bedtime as needed for sleep. 30 tablet 0  . methocarbamol (ROBAXIN) 500 MG tablet Take 1 tablet (500 mg total) by mouth every 6 (six) hours as needed for muscle spasms. 20 tablet 0  . Multiple Vitamins-Calcium (ONE-A-DAY WOMENS FORMULA) TABS Take 1 tablet by mouth at bedtime.    .  ondansetron (ZOFRAN-ODT) 8 MG disintegrating tablet Take 1 tablet (8 mg total) by mouth every 8 (eight) hours as needed for nausea or vomiting. (Patient taking differently: Take 8 mg by mouth every 8 (eight) hours as needed for nausea or vomiting (DISSOLVE ORALLY). ) 20 tablet 3  . oxyCODONE (OXY IR/ROXICODONE) 5 MG immediate release tablet Take 1-2 tablets (5-10 mg total) by mouth every 4 (four) hours as needed for moderate pain. 30 tablet 0  . pantoprazole (PROTONIX) 40 MG tablet TAKE 1 TABLET BY MOUTH ONCE DAILY 30 tablet 0  . prochlorperazine (COMPAZINE) 10 MG tablet TAKE 1 TABLET BY MOUTH EVERY 6 HOURS AS NEEDED FOR NAUSEA & VOMITING 30 tablet 1  . promethazine (PHENERGAN) 25 MG suppository Place 1 suppository (25 mg total) rectally every 6 (six) hours as needed for nausea or vomiting. 12 each 1  . SYNTHROID 75 MCG tablet Take 1 tablet by mouth once daily (Patient taking differently: Take 75 mcg by mouth daily before breakfast. ) 90 tablet 3   No current facility-administered medications for this encounter.    Physical Findings: The patient is in no acute distress. Patient is alert and oriented.  height is 5' 4"  (1.626 m) and weight is 154 lb 9.6 oz (70.1 kg). Her temperature is 98.3 F (36.8 C). Her blood pressure is 113/73 and her pulse is 101 (abnormal). Her respiration is 20 and oxygen  saturation is 100%.  No significant changes. Lungs are clear to auscultation bilaterally. Heart has regular rate and rhythm. No palpable cervical, supraclavicular, or axillary adenopathy. Abdomen soft, non-tender, normal bowel sounds. Left breast: no palpable mass, nipple discharge or bleeding. Right chest reveals a mastectomy scar which is healing well without signs of drainage or infection.  No remaining chest tubes.  Patient's range of movement in her right arm is very good at this time.  Lab Findings: Lab Results  Component Value Date   WBC 6.7 04/22/2019   HGB 10.6 (L) 04/22/2019   HCT 32.3 (L)  04/22/2019   MCV 100.3 (H) 04/22/2019   PLT 331 04/22/2019    Radiographic Findings: CT Chest W Contrast  Result Date: 05/13/2019 CLINICAL DATA:  Breast cancer staging. Prior mastectomy and adjuvant chemotherapy. EXAM: CT CHEST, ABDOMEN, AND PELVIS WITH CONTRAST TECHNIQUE: Multidetector CT imaging of the chest, abdomen and pelvis was performed following the standard protocol during bolus administration of intravenous contrast. CONTRAST:  143m OMNIPAQUE IOHEXOL 300 MG/ML  SOLN COMPARISON:  None. FINDINGS: CT CHEST FINDINGS Cardiovascular: Port in the anterior chest wall with tip in distal SVC. No significant vascular findings. Normal heart size. No pericardial effusion. Mediastinum/Nodes: Seroma in the RIGHT axilla measuring 3.5 x 1.4 cm (image 29/2) at lymphadenectomy site. Post RIGHT mastectomy. No supraclavicular adenopathy. No mediastinal adenopathy. No internal mammary adenopathy. No hilar adenopathy. Lungs/Pleura: No suspicious pulmonary nodules.  Airways normal. Musculoskeletal: No aggressive osseous lesion. CT ABDOMEN AND PELVIS FINDINGS Hepatobiliary: Low-density lesion in the central LEFT hepatic lobe has simple fluid attenuation consistent benign cysts. Gallbladder normal. Normal biliary tree Pancreas: Pancreas is normal. No ductal dilatation. No pancreatic inflammation. Spleen: Normal spleen Adrenals/urinary tract: Bilateral simple fluid renal cysts. Large cyst extends from the RIGHT kidney measuring 5 cm. Small nonobstructing 4 mm calculus in lower pole of the RIGHT kidney. Ureters and bladder normal. Stomach/Bowel: Stomach, small bowel, appendix, and cecum are normal. The colon and rectosigmoid colon are normal. Vascular/Lymphatic: Abdominal aorta is normal caliber. There is no retroperitoneal or periportal lymphadenopathy. No pelvic lymphadenopathy. Reproductive: Post hysterectomy Other: No peritoneal nodularity Musculoskeletal: No aggressive osseous lesion. Benign-appearing sclerotic lesion  the posterior LEFT iliac bone measures 6 mm. IMPRESSION: Chest Impression: 1. Postsurgical change consistent RIGHT mastectomy and axillary sentinel node dissection. 2. No evidence of metastatic disease in the thorax. Abdomen / Pelvis Impression: 1. No evidence of metastatic disease the abdomen pelvis. 2. No skeletal metastasis. Electronically Signed   By: SSuzy BouchardM.D.   On: 05/13/2019 17:01   CT Abdomen Pelvis W Contrast  Result Date: 05/13/2019 CLINICAL DATA:  Breast cancer staging. Prior mastectomy and adjuvant chemotherapy. EXAM: CT CHEST, ABDOMEN, AND PELVIS WITH CONTRAST TECHNIQUE: Multidetector CT imaging of the chest, abdomen and pelvis was performed following the standard protocol during bolus administration of intravenous contrast. CONTRAST:  1082mOMNIPAQUE IOHEXOL 300 MG/ML  SOLN COMPARISON:  None. FINDINGS: CT CHEST FINDINGS Cardiovascular: Port in the anterior chest wall with tip in distal SVC. No significant vascular findings. Normal heart size. No pericardial effusion. Mediastinum/Nodes: Seroma in the RIGHT axilla measuring 3.5 x 1.4 cm (image 29/2) at lymphadenectomy site. Post RIGHT mastectomy. No supraclavicular adenopathy. No mediastinal adenopathy. No internal mammary adenopathy. No hilar adenopathy. Lungs/Pleura: No suspicious pulmonary nodules.  Airways normal. Musculoskeletal: No aggressive osseous lesion. CT ABDOMEN AND PELVIS FINDINGS Hepatobiliary: Low-density lesion in the central LEFT hepatic lobe has simple fluid attenuation consistent benign cysts. Gallbladder normal. Normal biliary tree Pancreas: Pancreas is normal. No  ductal dilatation. No pancreatic inflammation. Spleen: Normal spleen Adrenals/urinary tract: Bilateral simple fluid renal cysts. Large cyst extends from the RIGHT kidney measuring 5 cm. Small nonobstructing 4 mm calculus in lower pole of the RIGHT kidney. Ureters and bladder normal. Stomach/Bowel: Stomach, small bowel, appendix, and cecum are normal. The  colon and rectosigmoid colon are normal. Vascular/Lymphatic: Abdominal aorta is normal caliber. There is no retroperitoneal or periportal lymphadenopathy. No pelvic lymphadenopathy. Reproductive: Post hysterectomy Other: No peritoneal nodularity Musculoskeletal: No aggressive osseous lesion. Benign-appearing sclerotic lesion the posterior LEFT iliac bone measures 6 mm. IMPRESSION: Chest Impression: 1. Postsurgical change consistent RIGHT mastectomy and axillary sentinel node dissection. 2. No evidence of metastatic disease in the thorax. Abdomen / Pelvis Impression: 1. No evidence of metastatic disease the abdomen pelvis. 2. No skeletal metastasis. Electronically Signed   By: Suzy Bouchard M.D.   On: 05/13/2019 17:01   NM Sentinel Node Inj-No Rpt (Breast)  Result Date: 04/21/2019 Sulfur colloid was injected by the nuclear medicine technologist for melanoma sentinel node.    Impression:  Stage IIB (ypT4b, pN0) Right Breast LIQ, Invasive Metaplastic Carcinoma, ER- / PR- / Her2-, Grade 3  Patient unfortunately did not have substantial improvement with her neoadjuvant chemotherapy.  She was however able to undergo successful surgical removal with clear margins however close deep margin being less than 1 mm.  Patient will be at risk for local regional recurrence and I would recommend postmastectomy radiation therapy in this situation.  I discussed the general course of treatment side effects and potential toxicities of radiation therapy in this situation with the patient.  She appears to understand and wishes to proceed with planned course of treatment  Plan:  Patient is scheduled for CT simulation in 1 week with treatments to begin approximately 5 to 6 weeks postop.  Patient will receive 6 weeks of postmastectomy radiation therapy.   -----------------------------------  Blair Promise, PhD, MD  This document serves as a record of services personally performed by [name], MD. It was created on  his behalf by Clerance Lav, a trained medical scribe. The creation of this record is based on the scribe's personal observations and the provider's statements to them. This document has been checked and approved by the attending provider.

## 2019-05-18 NOTE — Patient Instructions (Signed)

## 2019-05-18 NOTE — Research (Signed)
UPBEAT: 3 month study assessments 94-MONTH ASSESSMENT ACTIVITIES FOR UPBEAT ([id number]) STUDY; I met with patient in clinic this morning to complete her 17-monthon study assessments. Today, I also reviewed with the patient, the study form Verbal Notification of Option to [employer]ceive [employer] in which the study questionnaires would be emailed to patient, should she choose to participate. After all of patient's questions answered, patient declined and stated she would rather complete the questionnaires when here in clinic for the other assessments.   PROs: Patient completed study questionnaire on her own. Collected questionnaire from patient and checked for accuracy and completeness.   VS, Ht & Wt: Obtained patient's height (64 inches) and weight (153.1 lbs) per protocol.  Blood pressure and heart rate also obtained per protocol, after allowing patient to sit/rest for 5 minutes and approximately a 2 minute rest between readings.  1st v/s measured @ 1010= B/P 120/85, HR 85  2nd v/s measured @ 1012 = B/P 117/85, HR 86. BMI calculated using NIH BMI calculator website provided by study = 26.3 Concomitant Medication Review:  Reviewed current medication list and confirmed accuracy and updated to study forms. Waist Measurement: Waist measured per instructions on CRF 13 =  36.5 inches.  Neurocognitive Testing: Neurocognitive testing was administered by Research Specialist, ARemer MachoPhysical Functions Testing: Patient completed her physical functions testing which included 6-Minute Walk Test, Disability Measures, and SPPB without difficulty.  Research Labs: Labs were collected this morning and patient confirmed fasting for at least 3 hours before this lab appointment. Cardiac MRI: Scheduled for April 16th. Patient confirms no ED or hospital visits related to a cardiovascular event. Plan: Patient was informed that the next study visit is at the 12 month time point and approximately in September- January  (2022) is our window to complete. I also informed patient once the 3 month cardiac MRI is completed, on the 16th, we will be able to provide her with the study $25 gift card. All patient's questions answered to her satisfaction. Patient was thanked for her time and support of study and was encouraged to call with any questions or concerns she may have.  MMaxwell Marion RN, BSN, CMem[facility] AuthorityClinical Research 05/18/2019 2:05 PM

## 2019-05-19 ENCOUNTER — Encounter: Payer: Self-pay | Admitting: *Deleted

## 2019-05-20 MED FILL — BIMATOPROST 0.03% EYELASH S: 0.03 | 50 days supply | Qty: 5 | Fill #1

## 2019-05-22 ENCOUNTER — Other Ambulatory Visit: Payer: Self-pay

## 2019-05-22 ENCOUNTER — Ambulatory Visit (HOSPITAL_COMMUNITY)
Admission: RE | Admit: 2019-05-22 | Discharge: 2019-05-22 | Disposition: A | Discharge status: Home / Self Care | Payer: Self-pay | Source: Ambulatory Visit | Attending: Hematology and Oncology | Admitting: Hematology and Oncology

## 2019-05-22 ENCOUNTER — Telehealth: Payer: Self-pay

## 2019-05-22 ENCOUNTER — Encounter: Payer: Self-pay | Admitting: Medical Oncology

## 2019-05-22 DIAGNOSIS — C50311 Malignant neoplasm of lower-inner quadrant of right female breast: Secondary | ICD-10-CM

## 2019-05-22 DIAGNOSIS — Z171 Estrogen receptor negative status [ER-]: Secondary | ICD-10-CM

## 2019-05-22 DIAGNOSIS — C50919 Malignant neoplasm of unspecified site of unspecified female breast: Secondary | ICD-10-CM | POA: Insufficient documentation

## 2019-05-22 DIAGNOSIS — E039 Hypothyroidism, unspecified: Secondary | ICD-10-CM

## 2019-05-22 NOTE — Telephone Encounter (Signed)
Patient LVM on nurse line stating she had recent lab work done by [employer] and her TSH was very low. Lab is in epic. Patient requesting adjustment on Synthroid. Please advise.

## 2019-05-22 NOTE — Progress Notes (Signed)
UPBEAT: completion of 3 month assessment  I met with patient this afternoon for the completion of her 31-monthcardiac MRI and the final 3 month assessment.  After the cardiac MRI was completed, patient was dispensed the study provided $25 gift card, as well as, the study provided tote and magnetic calendar reminder of her next appointments. Patient understands that her next appointment will be the 15-monthssessment, and that there is no cardiac MRI for this assessment. She was informed that will be completed toward the end of the year through early January of 2022 and I will call her a month before to schedule. All patient's questions to her satisfaction. Patient was thanked for her time and her continued support of study.  Patient had requested a copy of the study lipid lab results and these were reviewed with her and a copy provided.  Patient encouraged to call with questions or concerns.  MiMaxwell MarionRN, BSN, CCEye [facility] Inclinical Research 05/22/2019 1:37 PM

## 2019-05-24 NOTE — Telephone Encounter (Signed)
Called.  Obviously, hyperthyrodid.  Told to stop the cytomil.  Continue the synthroid.  Recheck TSH in 6 weeks.  Future order entered.

## 2019-05-25 ENCOUNTER — Encounter: Payer: Self-pay | Admitting: *Deleted

## 2019-05-25 ENCOUNTER — Encounter: Payer: Self-pay | Admitting: Licensed Clinical Social Worker

## 2019-05-25 ENCOUNTER — Other Ambulatory Visit: Payer: Self-pay

## 2019-05-25 ENCOUNTER — Ambulatory Visit
Admission: RE | Admit: 2019-05-25 | Discharge: 2019-05-25 | Disposition: A | Discharge status: Home / Self Care | Payer: BC Managed Care – PPO | Source: Ambulatory Visit | Attending: Radiation Oncology | Admitting: Radiation Oncology

## 2019-05-25 DIAGNOSIS — C50311 Malignant neoplasm of lower-inner quadrant of right female breast: Secondary | ICD-10-CM | POA: Diagnosis not present

## 2019-05-25 NOTE — Progress Notes (Signed)
[place] CSW Progress Note  Patient stopped in support services today after radiation sim appointment inquiring about the financial assistance previously discussed with [place]. Holiday representative provided information and applications for [employer] and [employer] [place] and provided information on applying online for [employer].   CSW also provided brief emotional support. Patient became teary in session recalling stressors from this past year including her son's death, not being able to see her grandchild, losing her job due to [employer], and her diagnosis. She is scared about prognosis and treatment as well. CSW offered options for ongoing support including support groups, individual support with this CSW or our chaplain, or referral out for therapy. Patient will think about what she would like to do for individual support and let this CSW know her choice.    Edwinna Areola Eldar Robitaille , LCSW

## 2019-05-27 ENCOUNTER — Telehealth: Payer: Self-pay | Admitting: Hematology and Oncology

## 2019-05-27 NOTE — Telephone Encounter (Signed)
Scheduled appt per 4/19 sch message- mailed reminder letter with appt date and time

## 2019-05-28 ENCOUNTER — Encounter: Payer: Self-pay | Admitting: Licensed Clinical Social Worker

## 2019-05-28 DIAGNOSIS — C50311 Malignant neoplasm of lower-inner quadrant of right female breast: Secondary | ICD-10-CM | POA: Diagnosis not present

## 2019-05-28 NOTE — Progress Notes (Signed)
[place] CSW Progress Note  Clinical Education officer, museum contacted patient by phone to update on applications for assistance. CSW faxed completed [employer] and [employer] [place] applications today. Patient is working on completing [employer]. CSW also sent inquiry on behalf of the patient, with her permission, to [employer] regarding their wigs/ cranial prosthesis and tattooing financial assistance.    Edwinna Areola Maribell Demeo , LCSW

## 2019-05-29 ENCOUNTER — Other Ambulatory Visit: Payer: Self-pay | Admitting: General Surgery

## 2019-05-29 DIAGNOSIS — R222 Localized swelling, mass and lump, trunk: Secondary | ICD-10-CM

## 2019-06-01 ENCOUNTER — Other Ambulatory Visit: Payer: Self-pay

## 2019-06-01 ENCOUNTER — Ambulatory Visit
Admission: RE | Admit: 2019-06-01 | Discharge: 2019-06-01 | Disposition: A | Discharge status: Home / Self Care | Payer: Medicaid Other | Source: Ambulatory Visit | Attending: General Surgery | Admitting: General Surgery

## 2019-06-01 ENCOUNTER — Ambulatory Visit: Payer: BC Managed Care – PPO | Admitting: Radiation Oncology

## 2019-06-01 ENCOUNTER — Other Ambulatory Visit: Payer: Self-pay | Admitting: General Surgery

## 2019-06-01 ENCOUNTER — Other Ambulatory Visit: Payer: Self-pay | Admitting: *Deleted

## 2019-06-01 DIAGNOSIS — Z171 Estrogen receptor negative status [ER-]: Secondary | ICD-10-CM

## 2019-06-01 DIAGNOSIS — N6001 Solitary cyst of right breast: Secondary | ICD-10-CM | POA: Diagnosis not present

## 2019-06-01 DIAGNOSIS — C50311 Malignant neoplasm of lower-inner quadrant of right female breast: Secondary | ICD-10-CM

## 2019-06-01 DIAGNOSIS — R222 Localized swelling, mass and lump, trunk: Secondary | ICD-10-CM

## 2019-06-01 DIAGNOSIS — C50811 Malignant neoplasm of overlapping sites of right female breast: Secondary | ICD-10-CM | POA: Diagnosis not present

## 2019-06-01 DIAGNOSIS — N6315 Unspecified lump in the right breast, overlapping quadrants: Secondary | ICD-10-CM | POA: Diagnosis not present

## 2019-06-01 IMAGING — MG US  BREAST BX W/ LOC DEV 1ST LESION IMG BX SPEC US GUIDE*R*
1 series · 7 of 8 positions shown · non-contrast
Comparison: Previous exam(s).
COMPARISON: Previous exam(s).

Addendum:
CLINICAL DATA: 53-year-old female for tissue sampling of UPPER
RIGHT breast/chest mass and collection of ill-defined masses
INFERIOR to the RIGHT mastectomy scar. History of RIGHT mastectomy 6
weeks ago for metaplastic malignancy.

EXAM:
ULTRASOUND GUIDED RIGHT BREAST CORE NEEDLE BIOPSY X 2

[Series 1: MG view · 0.07mm/px · 7 of 30 slices shown]
[im 1/30]
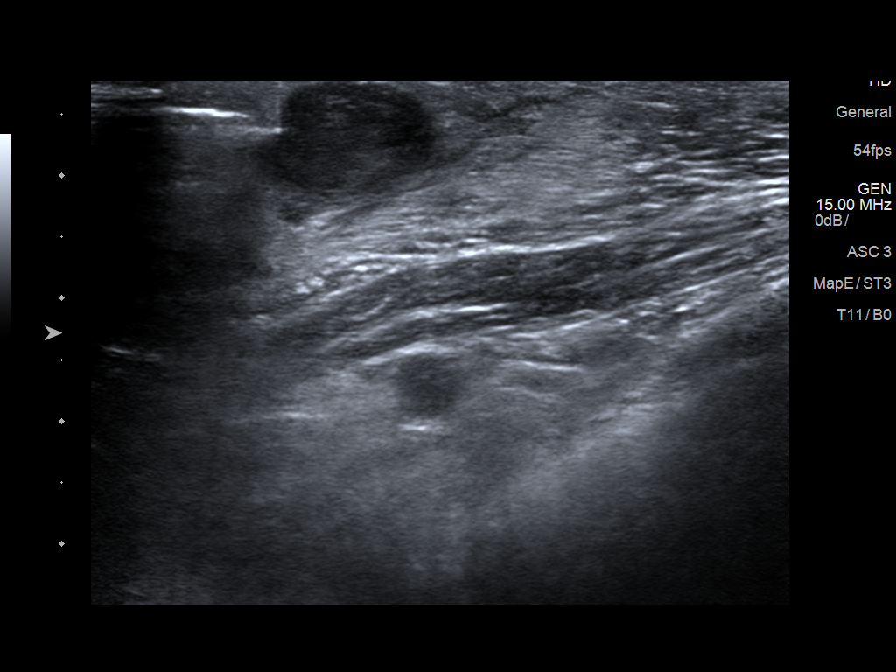
[im 5/30]
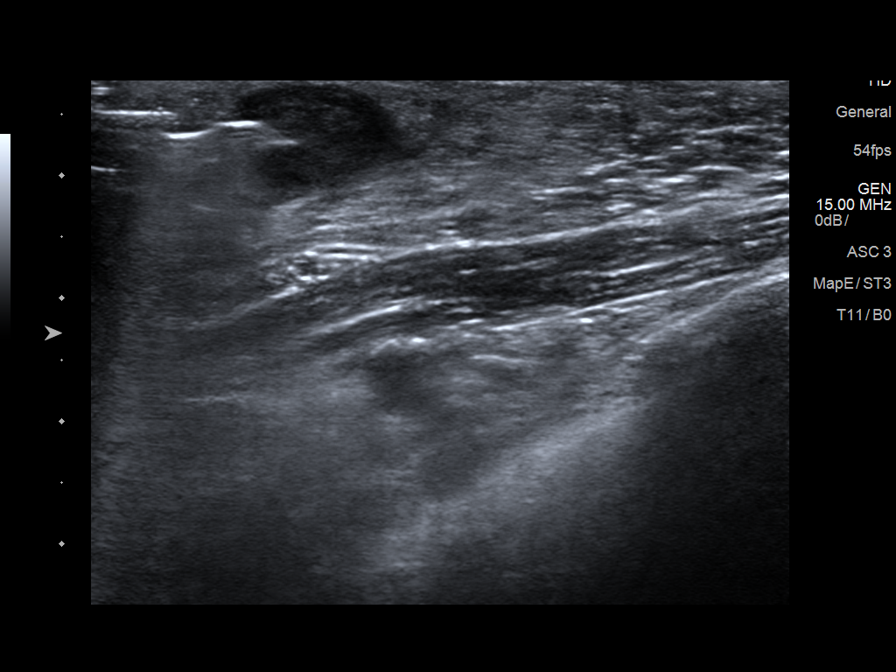
[im 9/30]
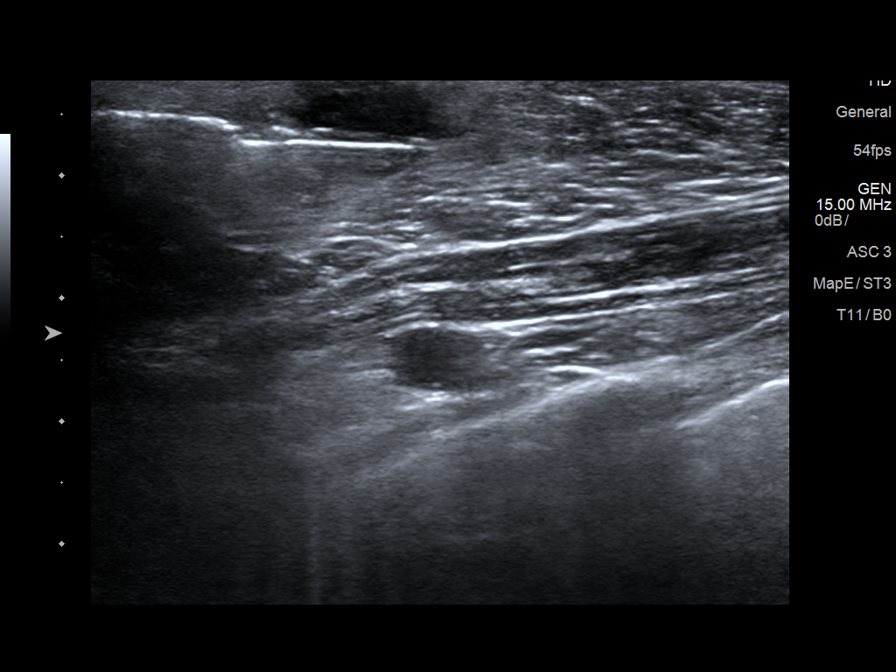
[im 13/30]
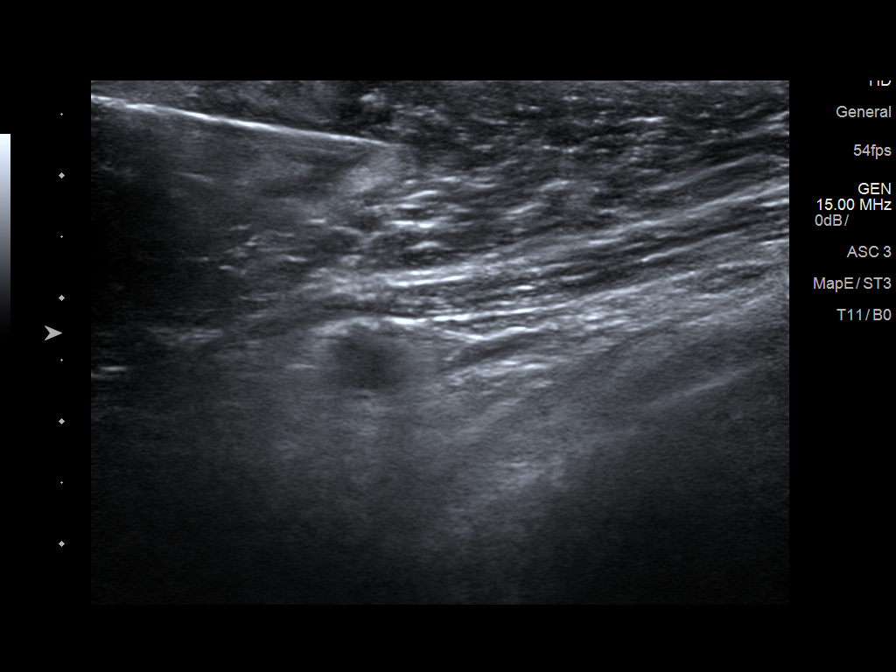
[im 17/30]
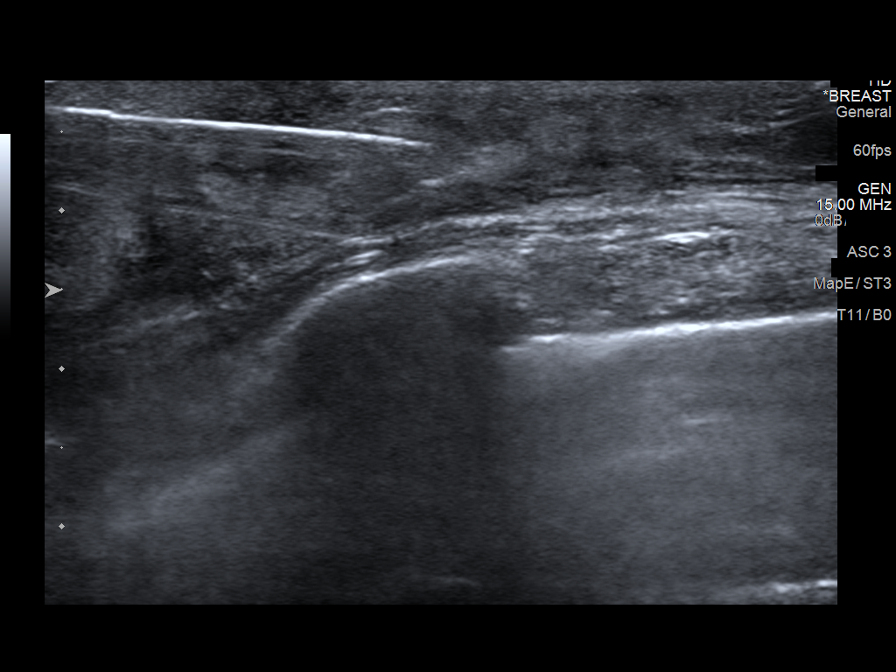
[im 21/30]
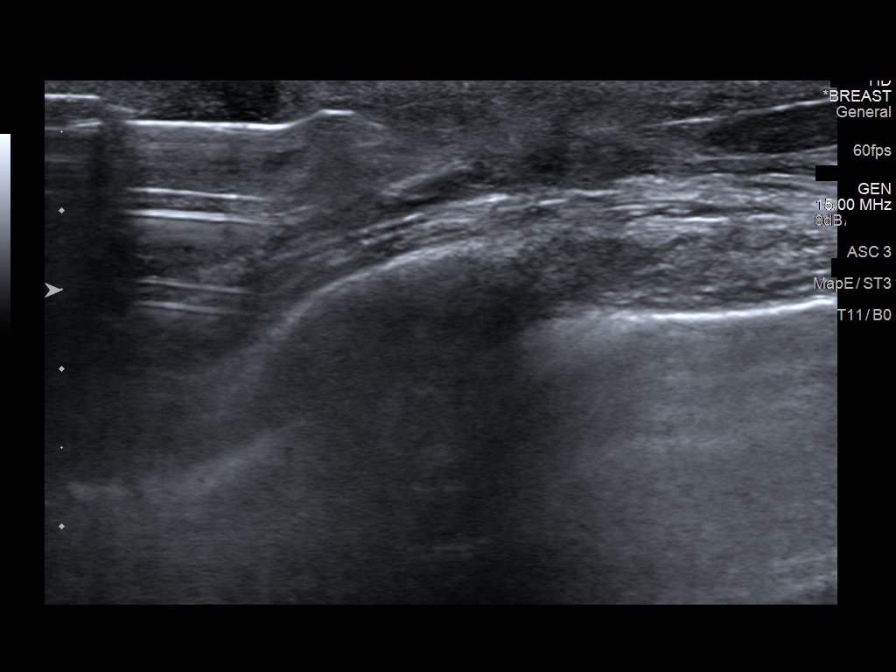
[im 25/30]
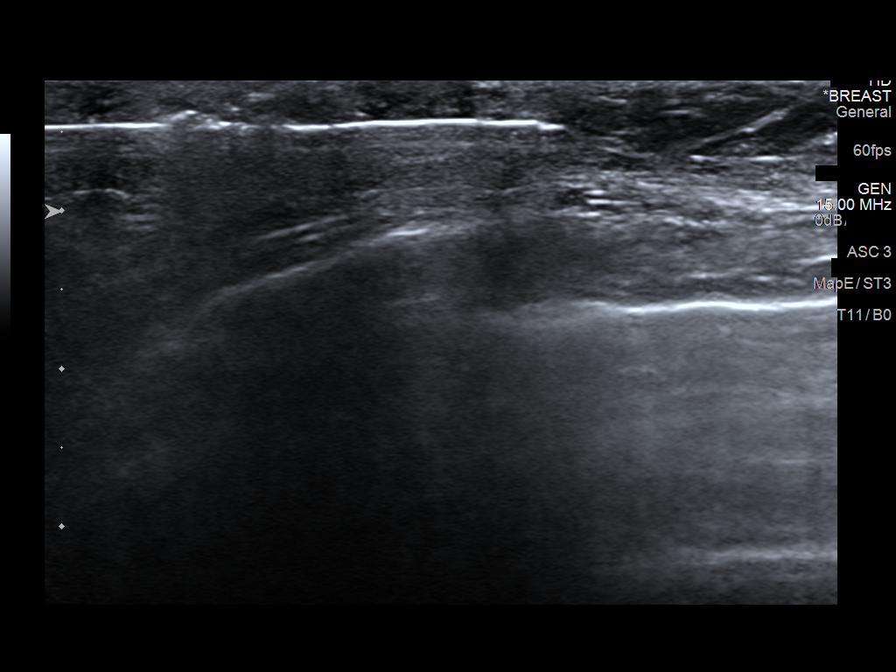

[7 of 8 positions shown; findings below may reference images not displayed]



ULTRASOUND-GUIDED RIGHT BREAST CORE NEEDLE BIOPSY #1 (complex solid
and cystic mass and adjacent smaller mass in the UPPER RIGHT
breast/chest):

Using sterile technique and 1% Lidocaine and 1% lidocaine with
epinephrine as local anesthetic, under direct ultrasound
visualization, a 14 gauge YAILIN device was used to perform
biopsy of the 1.8 cm solid and cystic mass and smaller adjacent mass
in the UPPER RIGHT breast/chest 15 cm above the mastectomy scar
using a SUPERIOR approach. At the conclusion of the procedure a Q
tissue marker clip was deployed into the biopsy cavity, with
satisfactory placement confirmed sonographically.

ULTRASOUND-GUIDED RIGHT BREAST CORE NEEDLE BIOPSY #2 (collection of
palpable ill-defined subcutaneous masses just INFERIOR to the
mastectomy scar):

Using sterile technique and 1% Lidocaine as local anesthetic, under
direct ultrasound visualization, a 14 gauge YAILIN device was
used to perform biopsy of the collection of ill-defined subcutaneous
masses 1 cm INFERIOR to the mid-lateral aspect of the mastectomy
scar using a LATERAL approach. At the conclusion of the procedure a
Q tissue marker clip was deployed into the biopsy cavity, with
satisfactory placement confirmed sonographically
IMPRESSION: Ultrasound guided biopsies of mass within the UPPER RIGHT
chest/breast and collection of ill-defined masses just INFERIOR to
the mastectomy scar. No apparent complications.

ADDENDUM:
Pathology revealed INVASIVE CARCINOMA WITH METAPLASTIC FEATURES of
the RIGHT breast, upper chest. This was found to be concordant by
Dr. YAILIN.

Pathology revealed INVASIVE CARCINOMA WITH METAPLASTIC FEATURES of
the RIGHT breast, inferior to mastectomy scar. This was found to be
concordant by Dr. YAILIN.

Microscopic Comment

1. and 2. The carcinoma in both parts appears morphologically
similar and measures 8 mm in greatest extent in each biopsy. The
features are similar to the patient's recent resection specimen.
Prognostic markers will be ordered.

Pathology results were discussed with the patient by telephone. The
patient reported doing well after the biopsies with tenderness at
the sites. Post biopsy instructions and care were reviewed and
questions were answered. The patient was encouraged to call The

RECOMMENDATION: MRI of the breasts for extent of new disease given
multiple subcutaneous palpable nodules identified.

Pathology results reported by YAILIN RN on [DATE].



ULTRASOUND-GUIDED RIGHT BREAST CORE NEEDLE BIOPSY #1 (complex solid
and cystic mass and adjacent smaller mass in the UPPER RIGHT
breast/chest):

Using sterile technique and 1% Lidocaine and 1% lidocaine with
epinephrine as local anesthetic, under direct ultrasound
visualization, a 14 gauge YAILIN device was used to perform
biopsy of the 1.8 cm solid and cystic mass and smaller adjacent mass
in the UPPER RIGHT breast/chest 15 cm above the mastectomy scar
using a SUPERIOR approach. At the conclusion of the procedure a Q
tissue marker clip was deployed into the biopsy cavity, with
satisfactory placement confirmed sonographically.

ULTRASOUND-GUIDED RIGHT BREAST CORE NEEDLE BIOPSY #2 (collection of
palpable ill-defined subcutaneous masses just INFERIOR to the
mastectomy scar):

Using sterile technique and 1% Lidocaine as local anesthetic, under
direct ultrasound visualization, a 14 gauge YAILIN device was
used to perform biopsy of the collection of ill-defined subcutaneous
masses 1 cm INFERIOR to the mid-lateral aspect of the mastectomy
scar using a LATERAL approach. At the conclusion of the procedure a
Q tissue marker clip was deployed into the biopsy cavity, with
satisfactory placement confirmed sonographically
IMPRESSION: Ultrasound guided biopsies of mass within the UPPER RIGHT
chest/breast and collection of ill-defined masses just INFERIOR to
the mastectomy scar. No apparent complications.

## 2019-06-01 IMAGING — US US BREAST*R* LIMITED INC AXILLA
1 series · 13 of 25 positions shown · non-contrast
Comparison: Previous exam(s).

CLINICAL DATA: 53-year-old female status post RIGHT mastectomy for
RIGHT breast invasive carcinoma with metaplastic features 6 weeks
ago. Now with multiple subcutaneous palpable nodules within the
RIGHT chest/breast since her RIGHT mastectomy.

EXAM:
ULTRASOUND OF THE RIGHT BREAST

[Series 1: us breast*right* limited inc axilla · 0.04mm/px · 13 of 31 slices shown]
[im 1/31]
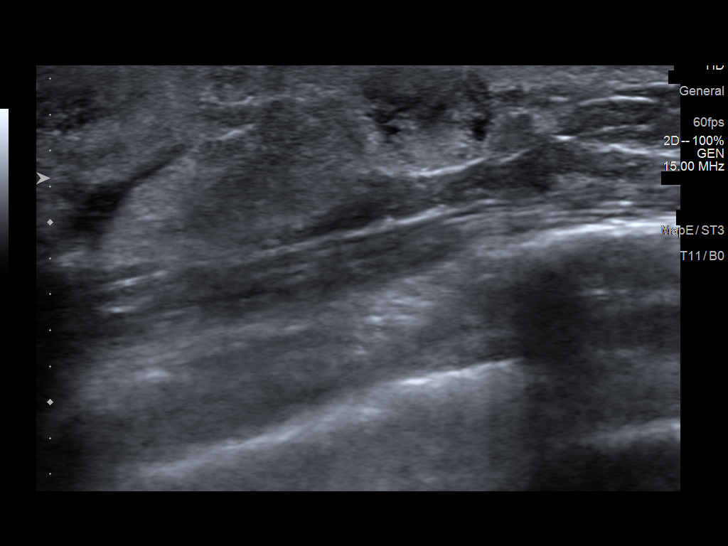
[im 3/31]
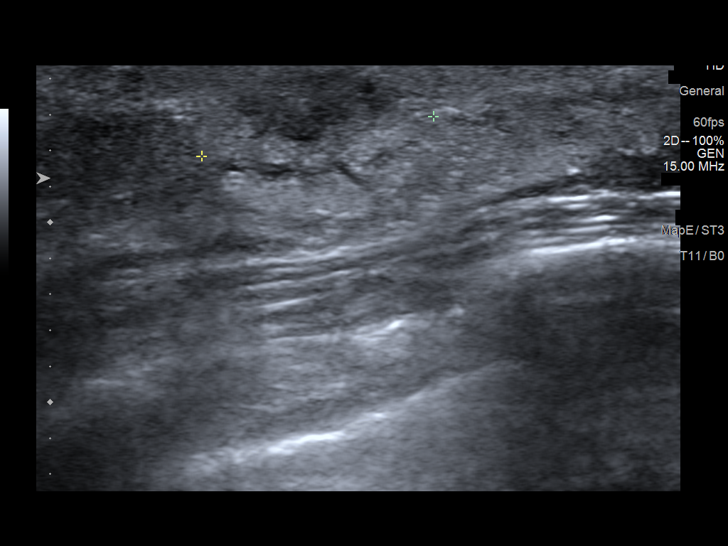
[im 6/31]
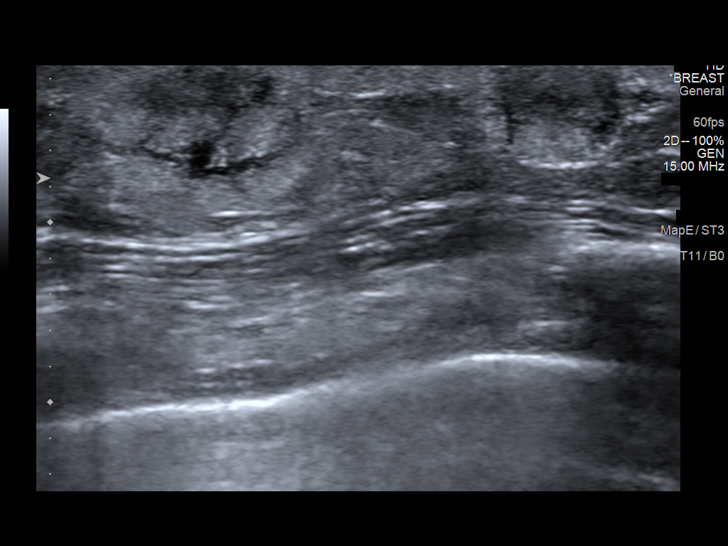
[im 8/31]
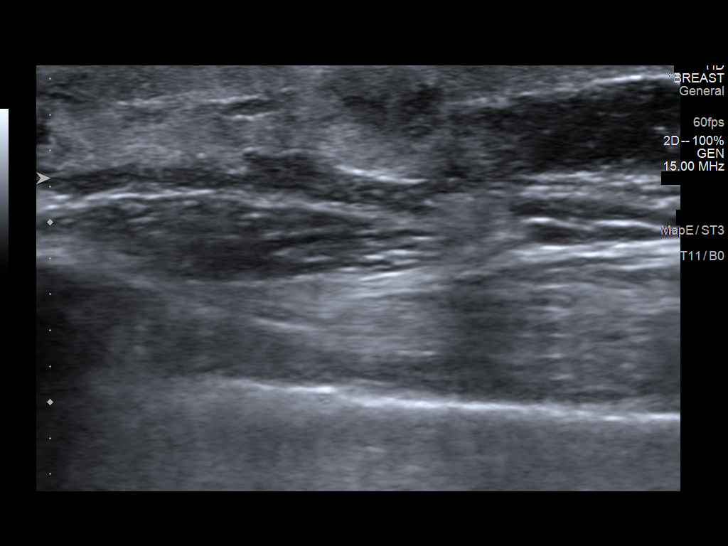
[im 11/31]
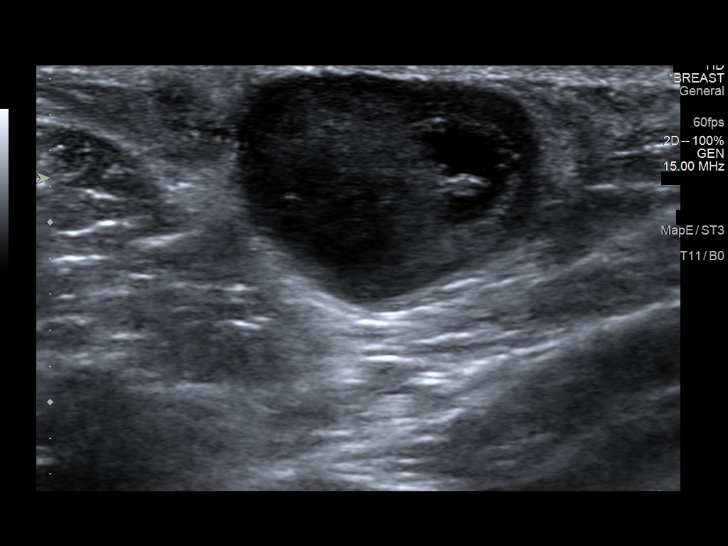
[im 13/31]
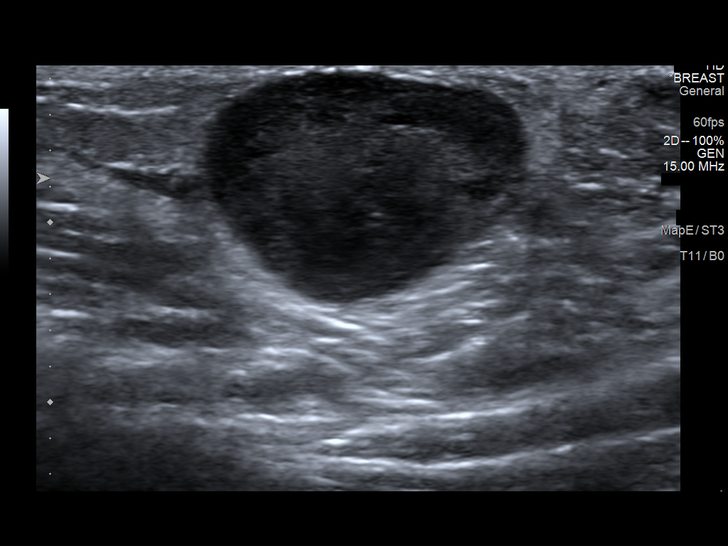
[im 16/31]
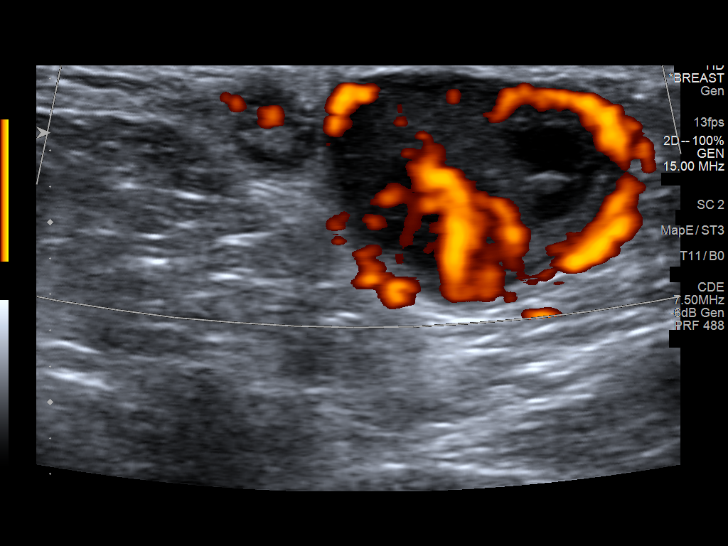
[im 18/31]
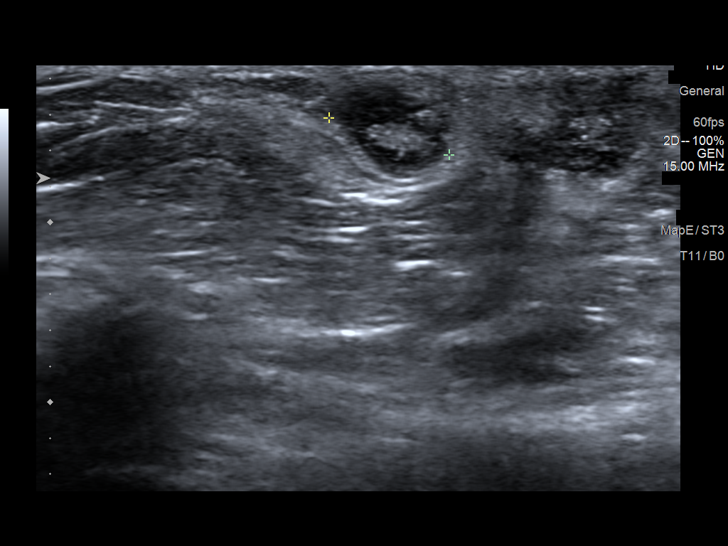
[im 21/31]
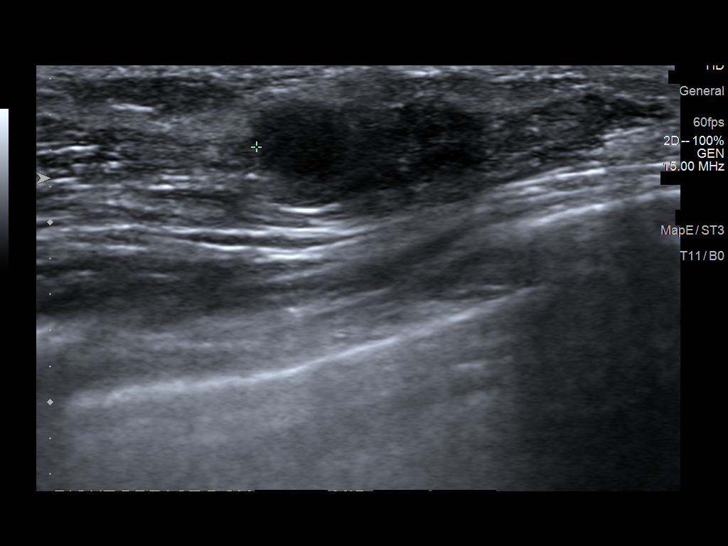
[im 23/31]
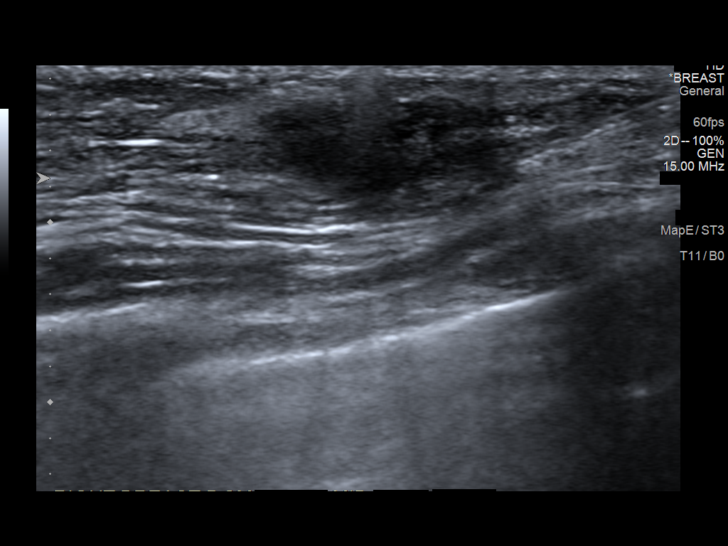
[im 26/31]
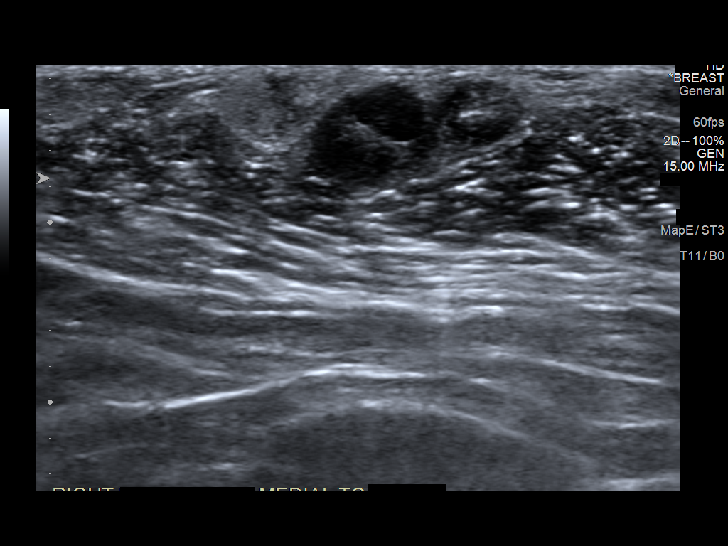
[im 28/31]
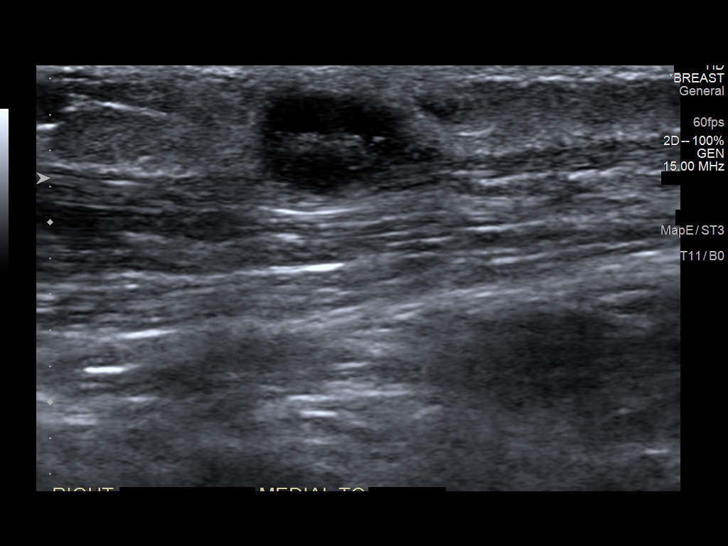
[im 31/31]
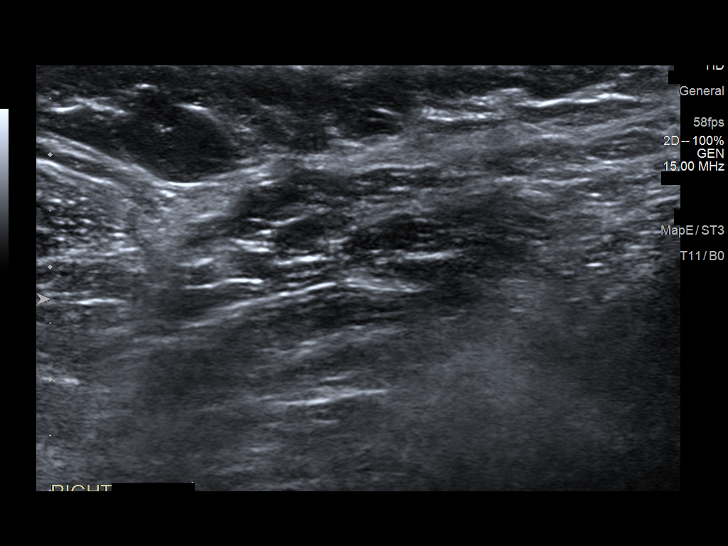

[13 of 25 positions shown; findings below may reference images not displayed]

FINDINGS: On physical exam, subcutaneous nodules are identified within the
UPPER RIGHT chest approximately 15 cm above the mastectomy scar,
within the RIGHT breast 1 cm INFERIOR to the mid-lateral aspect of
the mastectomy scar, within the RIGHT chest just MEDIAL to the RIGHT
axilla and within the RIGHT breast 5 cm above the mid aspect of the
mastectomy scar.

Targeted ultrasound is performed, showing the following:

A 1.7 x 1.3 x 1.8 cm complex solid and cystic mass with internal
vascular flow in the UPPER RIGHT breast/chest approximately 15 cm
above the mastectomy scar. An adjacent 0.5 x 0.6 x 0.7 cm
nodule/lymph node is noted along the LATERAL aspect of this complex
mass. These 2 masses encompass an area measuring 2.5 cm.

A collection of adjacent ill-defined heterogeneous masses within the
RIGHT breast 1 cm INFERIOR to the mid-lateral aspect of the
mastectomy scar, measuring 1.3 x 1.7 x 3.2 cm.

A 1.4 x 0.6 x 1.4 cm ill-defined hypoechoic mass within the RIGHT
breast 5 cm above the mid aspect of the mastectomy scar.

A 1.3 x 0.6 x 1 cm complex solid and cystic mass in the UPPER OUTER
RIGHT breast just MEDIAL to the RIGHT axilla.

Postsurgical changes within the RIGHT axilla noted without definite
abnormal lymph nodes.
IMPRESSION: 1. At least 4 indeterminate subcutaneous masses within the remaining
RIGHT breast as described above. The complex solid and cystic mass
within the UPPER RIGHT breast/chest and the collection of
ill-defined masses just INFERIOR to the mastectomy scar are the most
suspicious and tissue sampling is recommended.
2. No definite abnormal RIGHT axillary lymph nodes

RECOMMENDATION:
Ultrasound-guided biopsy of the 2 most suspicious masses will be
performed today but dictated in a separate report. These include the
solid cystic mass in the UPPER RIGHT breast/chest and the collection
of ill-defined masses just INFERIOR to the mastectomy scar.

Given other subcutaneous masses identified and difficulty in fully
evaluating the RIGHT axilla, MRI would be recommended for further
evaluation.

I have discussed the findings and recommendations with the patient.
If applicable, a reminder letter will be sent to the patient
regarding the next appointment.

BI-RADS CATEGORY  4: Suspicious.

## 2019-06-02 ENCOUNTER — Ambulatory Visit: Payer: BC Managed Care – PPO

## 2019-06-02 ENCOUNTER — Other Ambulatory Visit: Payer: Medicaid Other

## 2019-06-03 ENCOUNTER — Ambulatory Visit: Payer: BC Managed Care – PPO

## 2019-06-03 ENCOUNTER — Encounter: Payer: Self-pay | Admitting: *Deleted

## 2019-06-03 NOTE — Progress Notes (Signed)
Patient Care Team: Kara Resides, MD as PCP - General (Family Medicine) [name], RN as Oncology Nurse Navigator [name], RN as Oncology Nurse Navigator [name], MD as Consulting Physician (General Surgery) [name], MD as Consulting Physician (Hematology and Oncology) [name], MD as Consulting Physician (Radiation Oncology) [name], RN as Registered Nurse (Medical Oncology)  DIAGNOSIS:    ICD-10-CM   1. Malignant neoplasm of lower-inner quadrant of right breast of female, estrogen receptor negative ([place])  C50.311    Z17.1     SUMMARY OF ONCOLOGIC HISTORY: Oncology History  Malignant neoplasm of lower-inner quadrant of right breast of female, estrogen receptor negative ([place])  12/12/2018 Initial Diagnosis   Patient palpated right breast mass. Mammogram showed a 3.3cm solid and cystic right breast mass, 6 o'clock position, no abnormal right axillary lymph nodes. 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    12/17/2018 Cancer Staging   Staging form: Breast, AJCC 8th Edition - Clinical stage from 12/17/2018: Stage IIB (cT2, cN0, cM0, G3, ER-, PR-, HER2-) - Signed by [name], MD on 12/17/2018   12/23/2018 Genetic Testing   Negative genetic testing:  No pathogenic variants detected on the [facility] Multi-Cancer panel. A variant of uncertain significance was detected in the BARD1 gene called c.26_40dup. The report date is 12/23/2018.  The Multi-Cancer Panel offered by [facility] includes sequencing and/or deletion duplication testing of the following 85 genes: AIP, ALK, APC, ATM, AXIN2,BAP1,  BARD1, BLM, BMPR1A, BRCA1, BRCA2, BRIP1, CASR, CDC73, CDH1, CDK4, CDKN1B, CDKN1C, CDKN2A (p14ARF), CDKN2A (p16INK4a), CEBPA, CHEK2, CTNNA1, DICER1, DIS3L2, EGFR (c.2369C>T, p.Thr790Met variant only), EPCAM (Deletion/duplication testing only), FH, FLCN, GATA2, GPC3, GREM1 (Promoter region  deletion/duplication testing only), HOXB13 (c.251G>A, p.Gly84Glu), HRAS, KIT, MAX, MEN1, MET, MITF (c.952G>A, p.Glu318Lys variant only), MLH1, MSH2, MSH3, MSH6, MUTYH, NBN, NF1, NF2, NTHL1, PALB2, PDGFRA, PHOX2B, PMS2, POLD1, POLE, POT1, PRKAR1A, PTCH1, PTEN, RAD50, RAD51C, RAD51D, RB1, RECQL4, RET, RNF43, RUNX1, SDHAF2, SDHA (sequence changes only), SDHB, SDHC, SDHD, SMAD4, SMARCA4, SMARCB1, SMARCE1, STK11, SUFU, TERC, TERT, TMEM127, TP53, TSC1, TSC2, VHL, WRN and WT1.     12/30/2018 -  Chemotherapy   The patient had DOXOrubicin (ADRIAMYCIN) chemo injection 102 mg, 60 mg/m2 = 102 mg, Intravenous,  Once, 4 of 4 cycles Dose modification: 50 mg/m2 (original dose 60 mg/m2, Cycle 3, Reason: Dose not tolerated) Administration: 102 mg (12/30/2018), 102 mg (01/15/2019), 86 mg (01/28/2019), 86 mg (02/11/2019) palonosetron (ALOXI) injection 0.25 mg, 0.25 mg, Intravenous,  Once, 6 of 8 cycles Administration: 0.25 mg (12/30/2018), 0.25 mg (02/26/2019), 0.25 mg (01/15/2019), 0.25 mg (01/28/2019), 0.25 mg (02/11/2019), 0.25 mg (03/18/2019) ondansetron (ZOFRAN) injection 8 mg, 8 mg (100 % of original dose 8 mg), Intravenous,  Once, 2 of 4 cycles Dose modification: 8 mg (original dose 8 mg, Cycle 5) Administration: 8 mg (03/05/2019), 8 mg (03/12/2019), 8 mg (03/26/2019) pegfilgrastim-cbqv (UDENYCA) injection 6 mg, 6 mg, Subcutaneous, Once, 4 of 4 cycles Administration: 6 mg (01/01/2019), 6 mg (01/17/2019), 6 mg (01/30/2019), 6 mg (02/13/2019) CARBOplatin (PARAPLATIN) 600 mg in sodium chloride 0.9 % 250 mL chemo infusion, 600 mg (120.7 % of original dose 500.4 mg), Intravenous,  Once, 2 of 4 cycles Dose modification:   (original dose 500.4 mg, Cycle 5) Administration: 600 mg (02/26/2019), 600 mg (03/18/2019) cyclophosphamide (CYTOXAN) 1,020 mg in sodium chloride 0.9 % 250 mL chemo infusion, 600 mg/m2 = 1,020 mg, Intravenous,  Once, 4 of 4 cycles Dose modification: 500 mg/m2 (original dose  600 mg/m2, Cycle 3, Reason: Dose not  tolerated) Administration: 1,020 mg (12/30/2018), 1,020 mg (01/15/2019), 860 mg (01/28/2019), 860 mg (02/11/2019) PACLitaxel (TAXOL) 138 mg in sodium chloride 0.9 % 250 mL chemo infusion (</= 6m/m2), 80 mg/m2 = 138 mg, Intravenous,  Once, 2 of 4 cycles Dose modification: 65 mg/m2 (original dose 80 mg/m2, Cycle 6, Reason: Dose not tolerated) Administration: 138 mg (02/26/2019), 138 mg (03/05/2019), 138 mg (03/12/2019), 138 mg (03/18/2019), 138 mg (03/26/2019) fosaprepitant (EMEND) 150 mg, dexamethasone (DECADRON) 12 mg in sodium chloride 0.9 % 145 mL IVPB, , Intravenous,  Once, 6 of 8 cycles Administration:  (12/30/2018),  (02/26/2019),  (01/15/2019),  (01/28/2019),  (02/11/2019),  (03/18/2019)  for chemotherapy treatment.    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   06/01/2019 Relapse/Recurrence   Patient palpated multiple nodules in her right chest wall. [place]owed 4 indeterminate masses just inferior to the mastectomy scar. 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     CHIEF COMPLIANT: Follow-up s/p right mastectomy to review pathology   INTERVAL HISTORY: Kara NAVARRETEis a 54y.o. 54y.o. with above-mentioned history of triple negative right breast cancer who completed neoadjuvant chemotherapy and underwent a right mastectomy. She is a participant in the [employer] clinical trial.CT CAP on 05/13/18 showed no evidence of metastatic disease. Patient palpated multiple nodules in her right chest wall. UKoreaon 06/01/19 showed 4 indeterminate masses just inferior to the mastectomy scar. Biopsy on 06/01/18 showed invasive carcinoma with metaplastic features, HER-2 negative (0), ER/PR negative, Ki67 50%. She presents to the clinic todayto review the pathology report and discuss further treatment.    ALLERGIES:  has No Known Allergies.  MEDICATIONS:  Current Outpatient Medications   Medication Sig Dispense Refill  . acetaminophen (TYLENOL) 500 MG tablet Take 500-1,000 mg by mouth every 6 (six) hours as needed for mild pain or headache.    . b complex vitamins tablet Take 1 tablet by mouth at bedtime.    . bimatoprost (LATISSE) 0.03 % ophthalmic solution Place 0.03 mLs into both eyes at bedtime. Place one drop on applicator and apply evenly along the skin of the upper eyelid at base of eyelashes once daily at bedtime; repeat procedure for second eye (use a clean applicator). 5 mL 3  . buPROPion (WELLBUTRIN XL) 150 MG 24 hr tablet Take 1 tablet by mouth daily (Patient taking differently: Take 150 mg by mouth in the morning. ) 90 tablet 3  . CALCIUM PO Take 1 tablet by mouth at bedtime.    . docusate sodium (COLACE) 100 MG capsule Take 1 capsule (100 mg total) by mouth 2 (two) times daily. 20 capsule 0  . gabapentin (NEURONTIN) 100 MG capsule Take 1 capsule (100 mg total) by mouth 2 (two) times daily. 60 capsule 0  . lidocaine-prilocaine (EMLA) cream Apply to affected area once 30 g 3  . LORazepam (ATIVAN) 0.5 MG tablet Take 1 tablet (0.5 mg total) by mouth at bedtime as needed for sleep. 30 tablet 0  . methocarbamol (ROBAXIN) 500 MG tablet Take 1 tablet (500 mg total) by mouth every 6 (six) hours as needed for muscle spasms. 20 tablet 0  . Multiple Vitamins-Calcium (ONE-A-DAY WOMENS FORMULA) TABS Take 1 tablet by mouth at bedtime.    . ondansetron (ZOFRAN-ODT) 8 MG disintegrating tablet Take 1 tablet (8 mg total) by mouth every 8 (eight) hours as needed for nausea or  vomiting. (Patient taking differently: Take 8 mg by mouth every 8 (eight) hours as needed for nausea or vomiting (DISSOLVE ORALLY). ) 20 tablet 3  . oxyCODONE (OXY IR/ROXICODONE) 5 MG immediate release tablet Take 1-2 tablets (5-10 mg total) by mouth every 4 (four) hours as needed for moderate pain. 30 tablet 0  . pantoprazole (PROTONIX) 40 MG tablet TAKE 1 TABLET BY MOUTH ONCE DAILY 30 tablet 0  . prochlorperazine  (COMPAZINE) 10 MG tablet TAKE 1 TABLET BY MOUTH EVERY 6 HOURS AS NEEDED FOR NAUSEA & VOMITING 30 tablet 1  . promethazine (PHENERGAN) 25 MG suppository Place 1 suppository (25 mg total) rectally every 6 (six) hours as needed for nausea or vomiting. 12 each 1  . SYNTHROID 75 MCG tablet Take 1 tablet by mouth once daily (Patient taking differently: Take 75 mcg by mouth daily before breakfast. ) 90 tablet 3   No current facility-administered medications for this visit.    PHYSICAL EXAMINATION: ECOG PERFORMANCE STATUS: 1 - Symptomatic but completely ambulatory  Vitals:   06/04/19 1113  BP: (!) 129/94  Pulse: 92  Resp: 17  Temp: 98.9 F (37.2 C)  SpO2: 99%   Filed Weights   06/04/19 1113  Weight: 155 lb 14.4 oz (70.7 kg)    BREAST: Cutaneous metastases on the right chest wall (exam performed in the presence of a chaperone)       LABORATORY DATA:  I have reviewed the data as listed CMP Latest Ref Rng & Units 04/09/2019 04/02/2019 03/26/2019  Glucose 70 - 99 mg/dL 95 93 87  BUN 6 - 20 mg/dL _0 Creatinine 0.44 - 1.00 mg/dL 0.75 0.86 0.74  Sodium 135 - 145 mmol/L 141 144 140  Potassium 3.5 - 5.1 mmol/L 4.1 4.3 4.1  Chloride 98 - 111 mmol/L 108 108 108  CO2 22 - 32 mmol/L _1 Calcium 8.9 - 10.3 mg/dL 9.1 9.4 8.9  Total Protein 6.5 - 8.1 g/dL 7.1 7.0 6.7  Total Bilirubin 0.3 - 1.2 mg/dL <0.2(L) 0.3 0.2(L)  Alkaline Phos 38 - 126 U/L 83 79 66  AST 15 - 41 U/L _2 ALT 0 - 44 U/L 23 48(H) 31    Lab Results  Component Value Date   WBC 6.7 04/22/2019   HGB 10.6 (L) 04/22/2019   HCT 32.3 (L) 04/22/2019   MCV 100.3 (H) 04/22/2019   PLT 331 04/22/2019   NEUTROABS 1.7 04/09/2019    ASSESSMENT & PLAN:  Malignant neoplasm of lower-inner quadrant of right breast of female, estrogen receptor negative ([place]) 12/12/2018:Patient palpated right breast mass. Mammogram showed a 3.3cm solid and cystic right breast mass, 6 o'clock position, no abnormal right axillary lymph  nodes. 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  Triple negative with a Ki-67 of 15%  Cutaneous recurrence: I do not recommend additional systemic chemotherapy because the metaplastic carcinoma was did not respond well to chemo. I discussed her case with Dr. Jeani Hawking  Wetzel [place]orn[place].  Since the growth rate is so high, I requested her to call her in for a multidisciplinary clinic appointment. She discussed with me that potential options could be radiosensitizing treatment like Xeloda with radiation followed by resection at a later date. We also discussed the role of Sacituzumab-Govitecan.  Treatment plan: Surgical resection with skin grafting versus radiation therapy. The concern with removing these tumors is that she will need skin grafting and while the graft survives radiation cannot be done and that might cause delays and radiation.  And in spite of that the graft may fail because of radiation.  Patient is extremely concerned about this recurrence and she is not very keen on getting a skin graft because of her previous extensive history of skin grafts to the right side of her arm.  I will discuss the case with Dr. Barry Dienes and we will await the  recommendations from [place] before proceeding.  No orders of the defined types were placed in this encounter.  The patient has a good understanding of the overall plan. she agrees with it. she will call with any problems that may develop before the next visit here.  Total time spent: 45 mins including face to face time and time spent for planning, charting and coordination of care  [name], MD 06/04/2019  I, Cloyde Reams Dorshimer, am acting as scribe for Dr. [name].  I have reviewed the above documentation for accuracy and completeness, and I agree with the above.

## 2019-06-04 ENCOUNTER — Other Ambulatory Visit: Payer: Self-pay

## 2019-06-04 ENCOUNTER — Ambulatory Visit
Admission: RE | Admit: 2019-06-04 | Discharge: 2019-06-04 | Disposition: A | Discharge status: Home / Self Care | Payer: BC Managed Care – PPO | Source: Ambulatory Visit | Attending: Radiation Oncology | Admitting: Radiation Oncology

## 2019-06-04 ENCOUNTER — Ambulatory Visit: Payer: BC Managed Care – PPO

## 2019-06-04 ENCOUNTER — Inpatient Hospital Stay (HOSPITAL_BASED_OUTPATIENT_CLINIC_OR_DEPARTMENT_OTHER): Payer: BC Managed Care – PPO | Admitting: Hematology and Oncology

## 2019-06-04 VITALS — BP 118/91 | HR 80 | Temp 98.9°F | Resp 18 | Ht 65.0 in | Wt 154.0 lb

## 2019-06-04 DIAGNOSIS — Z923 Personal history of irradiation: Secondary | ICD-10-CM | POA: Insufficient documentation

## 2019-06-04 DIAGNOSIS — Z79899 Other long term (current) drug therapy: Secondary | ICD-10-CM | POA: Insufficient documentation

## 2019-06-04 DIAGNOSIS — Z9011 Acquired absence of right breast and nipple: Secondary | ICD-10-CM | POA: Insufficient documentation

## 2019-06-04 DIAGNOSIS — Z171 Estrogen receptor negative status [ER-]: Secondary | ICD-10-CM | POA: Diagnosis not present

## 2019-06-04 DIAGNOSIS — C7989 Secondary malignant neoplasm of other specified sites: Secondary | ICD-10-CM | POA: Diagnosis not present

## 2019-06-04 DIAGNOSIS — C50311 Malignant neoplasm of lower-inner quadrant of right female breast: Secondary | ICD-10-CM | POA: Diagnosis not present

## 2019-06-04 DIAGNOSIS — C434 Malignant melanoma of scalp and neck: Secondary | ICD-10-CM

## 2019-06-04 DIAGNOSIS — N200 Calculus of kidney: Secondary | ICD-10-CM | POA: Insufficient documentation

## 2019-06-04 NOTE — Progress Notes (Signed)
Radiation Oncology         [phone] ________________________________  Name: Kara Pittman MRN: [id number]  Date: 06/04/2019  DOB: 03-11-1965  Follow-Up Visit Note  CC: Andria Frames Jamal Collin, MD  [name], MD    ICD-10-CM   1. Malignant neoplasm of lower-inner quadrant of right breast of female, estrogen receptor negative ([place])  C50.311 MR Brain W Wo Contrast   Z17.1 NM PET Image Initial (PI) Whole Body  2. Melanoma of scalp ([place])  C43.4 NM PET Image Initial (PI) Whole Body    Diagnosis: StageIIB (pT4b, pN0)RightBreast LIQ,InvasiveMetaplasticCarcinoma, ER-/ PR-/ Her2-,Grade 3  Narrative: The patient returns today for skin check follow-up. She was seen in the multidisciplinary breast clinic on 12/17/2018 and was then seen for re-evaluation on 05/18/2019. At that time, the patient was to proceed with CT simulation with treatments to begin approximately 5-6 weeks post-op.  She was originally scheduled to start her postmastectomy radiation therapy earlier this week.  unfortunately, she has developed significant a recurrence before radiation treatment began.  Right breast and axillary ultrasound on 06/01/2019 showed at least four indeterminate subcutaneous masses within the remaining right chestwall. The complex solid and cystic mass within the upper right chest and the collection of ill-defined masses just inferior to the mastectomy scar were the most suspicious. There were no definite abnormal right axillary lymph nodes identified.  Biopsy on 06/01/2019 revealed invasive carcinoma with metaplastic features of the right  / upper chest and of the inferior mastectomy scar. Prognostic indicators significant for estrogen receptor 0% negative and progesterone receptor 0% negative. Proliferation marker Ki67 was 50%. Her2 negative.  On review of systems, she reports soreness along the recently biopsied areas. She denies new bony pain headaches dizziness or blurred vision.  Expresses  anxiety with this new issue developing prior to even starting her postmastectomy radiation therapy.                ALLERGIES:  has No Known Allergies.  Meds: Current Outpatient Medications  Medication Sig Dispense Refill  . acetaminophen (TYLENOL) 500 MG tablet Take 500-1,000 mg by mouth every 6 (six) hours as needed for mild pain or headache.    . b complex vitamins tablet Take 1 tablet by mouth at bedtime.    . bimatoprost (LATISSE) 0.03 % ophthalmic solution Place 0.03 mLs into both eyes at bedtime. Place one drop on applicator and apply evenly along the skin of the upper eyelid at base of eyelashes once daily at bedtime; repeat procedure for second eye (use a clean applicator). 5 mL 3  . buPROPion (WELLBUTRIN XL) 150 MG 24 hr tablet Take 1 tablet by mouth daily (Patient taking differently: Take 150 mg by mouth in the morning. ) 90 tablet 3  . CALCIUM PO Take 1 tablet by mouth at bedtime.    . docusate sodium (COLACE) 100 MG capsule Take 1 capsule (100 mg total) by mouth 2 (two) times daily. 20 capsule 0  . gabapentin (NEURONTIN) 100 MG capsule Take 1 capsule (100 mg total) by mouth 2 (two) times daily. 60 capsule 0  . lidocaine-prilocaine (EMLA) cream Apply to affected area once 30 g 3  . LORazepam (ATIVAN) 0.5 MG tablet Take 1 tablet (0.5 mg total) by mouth at bedtime as needed for sleep. 30 tablet 0  . methocarbamol (ROBAXIN) 500 MG tablet Take 1 tablet (500 mg total) by mouth every 6 (six) hours as needed for muscle spasms. 20 tablet 0  . Multiple Vitamins-Calcium (ONE-A-DAY WOMENS  FORMULA) TABS Take 1 tablet by mouth at bedtime.    . ondansetron (ZOFRAN-ODT) 8 MG disintegrating tablet Take 1 tablet (8 mg total) by mouth every 8 (eight) hours as needed for nausea or vomiting. (Patient taking differently: Take 8 mg by mouth every 8 (eight) hours as needed for nausea or vomiting (DISSOLVE ORALLY). ) 20 tablet 3  . oxyCODONE (OXY IR/ROXICODONE) 5 MG immediate release tablet Take 1-2 tablets  (5-10 mg total) by mouth every 4 (four) hours as needed for moderate pain. 30 tablet 0  . pantoprazole (PROTONIX) 40 MG tablet TAKE 1 TABLET BY MOUTH ONCE DAILY 30 tablet 0  . prochlorperazine (COMPAZINE) 10 MG tablet TAKE 1 TABLET BY MOUTH EVERY 6 HOURS AS NEEDED FOR NAUSEA & VOMITING 30 tablet 1  . promethazine (PHENERGAN) 25 MG suppository Place 1 suppository (25 mg total) rectally every 6 (six) hours as needed for nausea or vomiting. 12 each 1  . SYNTHROID 75 MCG tablet Take 1 tablet by mouth once daily (Patient taking differently: Take 75 mcg by mouth daily before breakfast. ) 90 tablet 3   No current facility-administered medications for this encounter.    Physical Findings: The patient is in no acute distress. Patient is alert and oriented.  height is 5' 5"  (1.651 m) and weight is 154 lb (69.9 kg). Her temperature is 98.9 F (37.2 C). Her blood pressure is 118/91 (abnormal) and her pulse is 80. Her respiration is 18 and oxygen saturation is 100%.   Lungs are clear to auscultation bilaterally. Heart has regular rate and rhythm. No palpable cervical, supraclavicular, or axillary adenopathy. Abdomen soft, non-tender, normal bowel sounds. Right chest wall examined and at least 7 separate nodules palpable.  The largest is along the right infraclavicular region (~3x3.5) and along the mastectomy scar.  Please see images taken in medical oncology clinic earlier today.   Lab Findings: Lab Results  Component Value Date   WBC 6.7 04/22/2019   HGB 10.6 (L) 04/22/2019   HCT 32.3 (L) 04/22/2019   MCV 100.3 (H) 04/22/2019   PLT 331 04/22/2019    Radiographic Findings: CT Chest W Contrast  Result Date: 05/13/2019 CLINICAL DATA:  Breast cancer staging. Prior mastectomy and adjuvant chemotherapy. EXAM: CT CHEST, ABDOMEN, AND PELVIS WITH CONTRAST TECHNIQUE: Multidetector CT imaging of the chest, abdomen and pelvis was performed following the standard protocol during bolus administration of  intravenous contrast. CONTRAST:  171m OMNIPAQUE IOHEXOL 300 MG/ML  SOLN COMPARISON:  None. FINDINGS: CT CHEST FINDINGS Cardiovascular: Port in the anterior chest wall with tip in distal SVC. No significant vascular findings. Normal heart size. No pericardial effusion. Mediastinum/Nodes: Seroma in the RIGHT axilla measuring 3.5 x 1.4 cm (image 29/2) at lymphadenectomy site. Post RIGHT mastectomy. No supraclavicular adenopathy. No mediastinal adenopathy. No internal mammary adenopathy. No hilar adenopathy. Lungs/Pleura: No suspicious pulmonary nodules.  Airways normal. Musculoskeletal: No aggressive osseous lesion. CT ABDOMEN AND PELVIS FINDINGS Hepatobiliary: Low-density lesion in the central LEFT hepatic lobe has simple fluid attenuation consistent benign cysts. Gallbladder normal. Normal biliary tree Pancreas: Pancreas is normal. No ductal dilatation. No pancreatic inflammation. Spleen: Normal spleen Adrenals/urinary tract: Bilateral simple fluid renal cysts. Large cyst extends from the RIGHT kidney measuring 5 cm. Small nonobstructing 4 mm calculus in lower pole of the RIGHT kidney. Ureters and bladder normal. Stomach/Bowel: Stomach, small bowel, appendix, and cecum are normal. The colon and rectosigmoid colon are normal. Vascular/Lymphatic: Abdominal aorta is normal caliber. There is no retroperitoneal or periportal lymphadenopathy. No pelvic lymphadenopathy. Reproductive:  Post hysterectomy Other: No peritoneal nodularity Musculoskeletal: No aggressive osseous lesion. Benign-appearing sclerotic lesion the posterior LEFT iliac bone measures 6 mm. IMPRESSION: Chest Impression: 1. Postsurgical change consistent RIGHT mastectomy and axillary sentinel node dissection. 2. No evidence of metastatic disease in the thorax. Abdomen / Pelvis Impression: 1. No evidence of metastatic disease the abdomen pelvis. 2. No skeletal metastasis. Electronically Signed   By: Suzy Bouchard M.D.   On: 05/13/2019 17:01   CT Abdomen  Pelvis W Contrast  Result Date: 05/13/2019 CLINICAL DATA:  Breast cancer staging. Prior mastectomy and adjuvant chemotherapy. EXAM: CT CHEST, ABDOMEN, AND PELVIS WITH CONTRAST TECHNIQUE: Multidetector CT imaging of the chest, abdomen and pelvis was performed following the standard protocol during bolus administration of intravenous contrast. CONTRAST:  171m OMNIPAQUE IOHEXOL 300 MG/ML  SOLN COMPARISON:  None. FINDINGS: CT CHEST FINDINGS Cardiovascular: Port in the anterior chest wall with tip in distal SVC. No significant vascular findings. Normal heart size. No pericardial effusion. Mediastinum/Nodes: Seroma in the RIGHT axilla measuring 3.5 x 1.4 cm (image 29/2) at lymphadenectomy site. Post RIGHT mastectomy. No supraclavicular adenopathy. No mediastinal adenopathy. No internal mammary adenopathy. No hilar adenopathy. Lungs/Pleura: No suspicious pulmonary nodules.  Airways normal. Musculoskeletal: No aggressive osseous lesion. CT ABDOMEN AND PELVIS FINDINGS Hepatobiliary: Low-density lesion in the central LEFT hepatic lobe has simple fluid attenuation consistent benign cysts. Gallbladder normal. Normal biliary tree Pancreas: Pancreas is normal. No ductal dilatation. No pancreatic inflammation. Spleen: Normal spleen Adrenals/urinary tract: Bilateral simple fluid renal cysts. Large cyst extends from the RIGHT kidney measuring 5 cm. Small nonobstructing 4 mm calculus in lower pole of the RIGHT kidney. Ureters and bladder normal. Stomach/Bowel: Stomach, small bowel, appendix, and cecum are normal. The colon and rectosigmoid colon are normal. Vascular/Lymphatic: Abdominal aorta is normal caliber. There is no retroperitoneal or periportal lymphadenopathy. No pelvic lymphadenopathy. Reproductive: Post hysterectomy Other: No peritoneal nodularity Musculoskeletal: No aggressive osseous lesion. Benign-appearing sclerotic lesion the posterior LEFT iliac bone measures 6 mm. IMPRESSION: Chest Impression: 1. Postsurgical  change consistent RIGHT mastectomy and axillary sentinel node dissection. 2. No evidence of metastatic disease in the thorax. Abdomen / Pelvis Impression: 1. No evidence of metastatic disease the abdomen pelvis. 2. No skeletal metastasis. Electronically Signed   By: SSuzy BouchardM.D.   On: 05/13/2019 17:01   [place] UN[place] RIGHT INC AXILLA  Result Date: 06/01/2019 CLINICAL DATA:  54year old female status post RIGHT mastectomy for RIGHT breast invasive carcinoma with metaplastic features 6 weeks ago. Now with multiple subcutaneous palpable nodules within the RIGHT chest/breast since her RIGHT mastectomy. EXAM: ULTRASOUND OF THE RIGHT BREAST COMPARISON:  Previous exam(s). FINDINGS: On physical exam, subcutaneous nodules are identified within the UPPER RIGHT chest approximately 15 cm above the mastectomy scar, within the RIGHT breast 1 cm INFERIOR to the mid-lateral aspect of the mastectomy scar, within the RIGHT chest just MEDIAL to the RIGHT axilla and within the RIGHT breast 5 cm above the mid aspect of the mastectomy scar. Targeted ultrasound is performed, showing the following: A 1.7 x 1.3 x 1.8 cm complex solid and cystic mass with internal vascular flow in the UPPER RIGHT breast/chest approximately 15 cm above the mastectomy scar. An adjacent 0.5 x 0.6 x 0.7 cm nodule/lymph node is noted along the LATERAL aspect of this complex mass. These 2 masses encompass an area measuring 2.5 cm. A collection of adjacent ill-defined heterogeneous masses within the RIGHT breast 1 cm INFERIOR to the mid-lateral aspect of the mastectomy scar, measuring 1.3 x  1.7 x 3.2 cm. A 1.4 x 0.6 x 1.4 cm ill-defined hypoechoic mass within the RIGHT breast 5 cm above the mid aspect of the mastectomy scar. A 1.3 x 0.6 x 1 cm complex solid and cystic mass in the UPPER OUTER RIGHT breast just MEDIAL to the RIGHT axilla. Postsurgical changes within the RIGHT axilla noted without definite abnormal lymph nodes. IMPRESSION: 1. At least  4 indeterminate subcutaneous masses within the remaining RIGHT breast as described above. The complex solid and cystic mass within the UPPER RIGHT breast/chest and the collection of ill-defined masses just INFERIOR to the mastectomy scar are the most suspicious and tissue sampling is recommended. 2. No definite abnormal RIGHT axillary lymph nodes RECOMMENDATION: Ultrasound-guided biopsy of the 2 most suspicious masses will be performed today but dictated in a separate report. These include the solid cystic mass in the UPPER RIGHT breast/chest and the collection of ill-defined masses just INFERIOR to the mastectomy scar. Given other subcutaneous masses identified and difficulty in fully evaluating the RIGHT axilla, MRI would be recommended for further evaluation. I have discussed the findings and recommendations with the patient. If applicable, a reminder letter will be sent to the patient regarding the next appointment. BI-RADS CATEGORY  4: Suspicious. Electronically Signed   By: Margarette Canada M.D.   On: 06/01/2019 16:37   [place] RT BREAST BX W LOC DEV 1ST LESION IMG BX SPEC US GUIDE  Addendum Date: 06/02/2019   ADDENDUM REPORT: 06/02/2019 16:24 ADDENDUM: Pathology revealed INVASIVE CARCINOMA WITH METAPLASTIC FEATURES of the RIGHT breast, upper chest. This was found to be concordant by Dr. Hassan Rowan. Pathology revealed INVASIVE CARCINOMA WITH METAPLASTIC FEATURES of the RIGHT breast, inferior to mastectomy scar. This was found to be concordant by Dr. Hassan Rowan. Microscopic Comment 1. and 2. The carcinoma in both parts appears morphologically similar and measures 8 mm in greatest extent in each biopsy. The features are similar to the patient's recent resection specimen. Prognostic markers will be ordered. Pathology results were discussed with the patient by telephone. The patient reported doing well after the biopsies with tenderness at the sites. Post biopsy instructions and care were reviewed and questions were answered.  The patient was encouraged to call [facility] for any additional concerns. RECOMMENDATION: MRI of the breasts for extent of new disease given multiple subcutaneous palpable nodules identified. Pathology results reported by Stacie Acres RN on 06/02/2019. Electronically Signed   By: Margarette Canada M.D.   On: 06/02/2019 16:24   Result Date: 06/02/2019 CLINICAL DATA:  54 year old female for tissue sampling of UPPER RIGHT breast/chest mass and collection of ill-defined masses INFERIOR to the RIGHT mastectomy scar. History of RIGHT mastectomy 6 weeks ago for metaplastic malignancy. EXAM: ULTRASOUND GUIDED RIGHT BREAST CORE NEEDLE BIOPSY X 2 COMPARISON:  Previous exam(s). FINDINGS: I met with the patient and we discussed the procedure of ultrasound-guided biopsy, including benefits and alternatives. We discussed the high likelihood of a successful procedure. We discussed the risks of the procedure, including infection, bleeding, tissue injury, clip migration, and inadequate sampling. Informed written consent was given. The usual time-out protocol was performed immediately prior to the procedure. ULTRASOUND-GUIDED RIGHT BREAST CORE NEEDLE BIOPSY #1 (complex solid and cystic mass and adjacent smaller mass in the UPPER RIGHT breast/chest): Using sterile technique and 1% Lidocaine and 1% lidocaine with epinephrine as local anesthetic, under direct ultrasound visualization, a 14 gauge spring-loaded device was used to perform biopsy of the 1.8 cm solid and cystic mass and  smaller adjacent mass in the UPPER RIGHT breast/chest 15 cm above the mastectomy scar using a SUPERIOR approach. At the conclusion of the procedure a Q tissue marker clip was deployed into the biopsy cavity, with satisfactory placement confirmed sonographically. ULTRASOUND-GUIDED RIGHT BREAST CORE NEEDLE BIOPSY #2 (collection of palpable ill-defined subcutaneous masses just INFERIOR to the mastectomy scar): Using sterile technique and  1% Lidocaine as local anesthetic, under direct ultrasound visualization, a 14 gauge spring-loaded device was used to perform biopsy of the collection of ill-defined subcutaneous masses 1 cm INFERIOR to the mid-lateral aspect of the mastectomy scar using a LATERAL approach. At the conclusion of the procedure a Q tissue marker clip was deployed into the biopsy cavity, with satisfactory placement confirmed sonographically IMPRESSION: Ultrasound guided biopsies of mass within the UPPER RIGHT chest/breast and collection of ill-defined masses just INFERIOR to the mastectomy scar. No apparent complications. Electronically Signed: By: Margarette Canada M.D. On: 06/01/2019 16:42   [place] RT BREAST BX W LOC DEV EA ADD LESION IMG BX SPEC US GUIDE  Addendum Date: 06/02/2019   ADDENDUM REPORT: 06/02/2019 16:24 ADDENDUM: Pathology revealed INVASIVE CARCINOMA WITH METAPLASTIC FEATURES of the RIGHT breast, upper chest. This was found to be concordant by Dr. Hassan Rowan. Pathology revealed INVASIVE CARCINOMA WITH METAPLASTIC FEATURES of the RIGHT breast, inferior to mastectomy scar. This was found to be concordant by Dr. Hassan Rowan. Microscopic Comment 1. and 2. The carcinoma in both parts appears morphologically similar and measures 8 mm in greatest extent in each biopsy. The features are similar to the patient's recent resection specimen. Prognostic markers will be ordered. Pathology results were discussed with the patient by telephone. The patient reported doing well after the biopsies with tenderness at the sites. Post biopsy instructions and care were reviewed and questions were answered. The patient was encouraged to call [facility] for any additional concerns. RECOMMENDATION: MRI of the breasts for extent of new disease given multiple subcutaneous palpable nodules identified. Pathology results reported by Stacie Acres RN on 06/02/2019. Electronically Signed   By: Margarette Canada M.D.   On: 06/02/2019 16:24    Result Date: 06/02/2019 CLINICAL DATA:  54 year old female for tissue sampling of UPPER RIGHT breast/chest mass and collection of ill-defined masses INFERIOR to the RIGHT mastectomy scar. History of RIGHT mastectomy 6 weeks ago for metaplastic malignancy. EXAM: ULTRASOUND GUIDED RIGHT BREAST CORE NEEDLE BIOPSY X 2 COMPARISON:  Previous exam(s). FINDINGS: I met with the patient and we discussed the procedure of ultrasound-guided biopsy, including benefits and alternatives. We discussed the high likelihood of a successful procedure. We discussed the risks of the procedure, including infection, bleeding, tissue injury, clip migration, and inadequate sampling. Informed written consent was given. The usual time-out protocol was performed immediately prior to the procedure. ULTRASOUND-GUIDED RIGHT BREAST CORE NEEDLE BIOPSY #1 (complex solid and cystic mass and adjacent smaller mass in the UPPER RIGHT breast/chest): Using sterile technique and 1% Lidocaine and 1% lidocaine with epinephrine as local anesthetic, under direct ultrasound visualization, a 14 gauge spring-loaded device was used to perform biopsy of the 1.8 cm solid and cystic mass and smaller adjacent mass in the UPPER RIGHT breast/chest 15 cm above the mastectomy scar using a SUPERIOR approach. At the conclusion of the procedure a Q tissue marker clip was deployed into the biopsy cavity, with satisfactory placement confirmed sonographically. ULTRASOUND-GUIDED RIGHT BREAST CORE NEEDLE BIOPSY #2 (collection of palpable ill-defined subcutaneous masses just INFERIOR to the mastectomy scar): Using sterile technique and 1%  Lidocaine as local anesthetic, under direct ultrasound visualization, a 14 gauge spring-loaded device was used to perform biopsy of the collection of ill-defined subcutaneous masses 1 cm INFERIOR to the mid-lateral aspect of the mastectomy scar using a LATERAL approach. At the conclusion of the procedure a Q tissue marker clip was deployed into  the biopsy cavity, with satisfactory placement confirmed sonographically IMPRESSION: Ultrasound guided biopsies of mass within the UPPER RIGHT chest/breast and collection of ill-defined masses just INFERIOR to the mastectomy scar. No apparent complications. Electronically Signed: By: Margarette Canada M.D. On: 06/01/2019 16:42    Impression: StageIIB (pT4b, pN0)RightBreast LIQ,InvasiveMetaplasticCarcinoma, ER-/ PR-/ Her2-,Grade 3  Patient has developed a rapid recurrence after her mastectomy, biopsy-proven in several areas along the right chest wall area.  The larger lesions noted could not be controlled with postmastectomy radiation therapy however additional surgery would delay initiation of radiation therapy with potential for further new growth. I should mention that her surgical scars were clear at the time of her mastectomy.   Plan: Case was discussed with Dr. Lindi Adie and Dr. Barry Dienes in detail.  Dr. Lindi Adie has referred the patient to the multidisciplinary breast clinic at [facility] for evaluation next week for further input.  Given this rapid recurrence the patient will proceed with a PET scan as well as a brain MRI to rule out distant metastasis.  Orders placed today.  ____________________________________   Blair Promise, PhD, MD  This document serves as a record of services personally performed by [name], MD. It was created on his behalf by Clerance Lav, a trained medical scribe. The creation of this record is based on the scribe's personal observations and the provider's statements to them. This document has been checked and approved by the attending provider.

## 2019-06-04 NOTE — Assessment & Plan Note (Signed)
12/12/2018:Patient palpated right breast mass. Mammogram showed a 3.3cm solid and cystic right breast mass, 6 o'clock position, no abnormal right axillary lymph nodes. 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  Triple negative with a Ki-67 of 15%  Cutaneous recurrence: I do not recommend additional systemic chemotherapy because the metaplastic carcinoma was did not respond well to chemo.  Treatment plan: Surgical resection with skin grafting versus radiation therapy. The concern with removing these tumors is that she will need skin grafting and while the graft survives radiation cannot be done and that might cause delays and radiation.  And in spite of that the graft may fail because of radiation.

## 2019-06-05 ENCOUNTER — Other Ambulatory Visit: Payer: Self-pay

## 2019-06-05 ENCOUNTER — Ambulatory Visit: Payer: BC Managed Care – PPO

## 2019-06-05 DIAGNOSIS — Z171 Estrogen receptor negative status [ER-]: Secondary | ICD-10-CM

## 2019-06-05 DIAGNOSIS — C50311 Malignant neoplasm of lower-inner quadrant of right female breast: Secondary | ICD-10-CM

## 2019-06-05 DIAGNOSIS — R410 Disorientation, unspecified: Secondary | ICD-10-CM

## 2019-06-08 ENCOUNTER — Ambulatory Visit: Payer: BC Managed Care – PPO

## 2019-06-08 ENCOUNTER — Ambulatory Visit: Payer: BC Managed Care – PPO | Admitting: Radiation Oncology

## 2019-06-08 DIAGNOSIS — C50311 Malignant neoplasm of lower-inner quadrant of right female breast: Secondary | ICD-10-CM | POA: Diagnosis not present

## 2019-06-08 DIAGNOSIS — Z9011 Acquired absence of right breast and nipple: Secondary | ICD-10-CM | POA: Diagnosis not present

## 2019-06-08 DIAGNOSIS — N6489 Other specified disorders of breast: Secondary | ICD-10-CM | POA: Diagnosis not present

## 2019-06-09 ENCOUNTER — Telehealth: Payer: Self-pay | Admitting: Adult Health

## 2019-06-09 ENCOUNTER — Encounter: Payer: Self-pay | Admitting: *Deleted

## 2019-06-09 ENCOUNTER — Ambulatory Visit: Payer: BC Managed Care – PPO

## 2019-06-09 ENCOUNTER — Telehealth: Payer: Self-pay | Admitting: *Deleted

## 2019-06-09 MED FILL — oxyCODONE HCL 5 MG TABS: 5 | 6 days supply | Qty: 25 | Fill #0

## 2019-06-09 MED FILL — METHOCARBAMOL 500 MG TABS: 500 | 10 days supply | Qty: 30 | Fill #0

## 2019-06-09 NOTE — Telephone Encounter (Signed)
Called patient to inform of Pet Scan being cancelled due to insurance not authorizing test, spoke with patient and she verified understanding this

## 2019-06-09 NOTE — Telephone Encounter (Signed)
Spoke with Dr. James Ivanoff for peer to peer. MRI denied.  Patient without signs and symptoms in both notes from Dr. Lindi Adie along with Dr. Sondra Come regarding reason for MRI brain.  I reviewed with Dr. James Ivanoff that based on her rapid recurrence of 4 1cm lesions in right chest wall after her surgery, that we are very concerned about distant metastases.  Per Dr. James Ivanoff, unless patient is symptomatic, she cannot approve brain MRI.  If patient becomes symptomatic, please place a new order, and write symptoms within that orders only encounter to be faxed to the insurance company.    Wilber Bihari, NP

## 2019-06-10 ENCOUNTER — Ambulatory Visit (HOSPITAL_COMMUNITY): Payer: BC Managed Care – PPO

## 2019-06-10 ENCOUNTER — Telehealth: Payer: Self-pay | Admitting: *Deleted

## 2019-06-10 ENCOUNTER — Ambulatory Visit: Payer: BC Managed Care – PPO

## 2019-06-10 NOTE — Telephone Encounter (Signed)
CALLED PATIENT TO INFORM THAT PET HAS BEEN SCHEDULED FOR 06-11-19 - ARRIVAL TIME- 7:30 AM @ [facility], LVM FOR A RETURN CALL

## 2019-06-10 NOTE — Progress Notes (Signed)
Patient Care Team: [name], MD as PCP - General (Family Medicine) [name], RN as Oncology Nurse Navigator Carlynn Spry, Charlott Holler, RN as Oncology Nurse Navigator [name], MD as Consulting Physician (General Surgery) [name], MD as Consulting Physician (Hematology and Oncology) [name], MD as Consulting Physician (Radiation Oncology) [name], RN as Registered Nurse (Medical Oncology)  DIAGNOSIS:    ICD-10-CM   1. Confusion  R41.0 MR BRAIN W WO CONTRAST  2. Malignant neoplasm of lower-inner quadrant of right breast of female, estrogen receptor negative ([place])  C50.311 MR BRAIN W WO CONTRAST   Z17.1   3. Acute intractable headache, unspecified headache type  R51.9 MR BRAIN W WO CONTRAST    SUMMARY OF ONCOLOGIC HISTORY: Oncology History  Malignant neoplasm of lower-inner quadrant of right breast of female, estrogen receptor negative ([place])  12/12/2018 Initial Diagnosis   Patient palpated right breast mass. Mammogram showed a 3.3cm solid and cystic right breast mass, 6 o'clock position, no abnormal right axillary lymph nodes. 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    12/17/2018 Cancer Staging   Staging form: Breast, AJCC 8th Edition - Clinical stage from 12/17/2018: Stage IIB (cT2, cN0, cM0, G3, ER-, PR-, HER2-) - Signed by [name], MD on 12/17/2018   12/23/2018 Genetic Testing   Negative genetic testing:  No pathogenic variants detected on the [facility] Multi-Cancer panel. A variant of uncertain significance was detected in the BARD1 gene called c.26_40dup. The report date is 12/23/2018.  The Multi-Cancer Panel offered by [facility] includes sequencing and/or deletion duplication testing of the following 85 genes: AIP, ALK, APC, ATM, AXIN2,BAP1,  BARD1, BLM, BMPR1A, BRCA1, BRCA2, BRIP1, CASR, CDC73, CDH1, CDK4, CDKN1B, CDKN1C, CDKN2A (p14ARF), CDKN2A (p16INK4a), CEBPA, CHEK2, CTNNA1, DICER1,  DIS3L2, EGFR (c.2369C>T, p.Thr790Met variant only), EPCAM (Deletion/duplication testing only), FH, FLCN, GATA2, GPC3, GREM1 (Promoter region deletion/duplication testing only), HOXB13 (c.251G>A, p.Gly84Glu), HRAS, KIT, MAX, MEN1, MET, MITF (c.952G>A, p.Glu318Lys variant only), MLH1, MSH2, MSH3, MSH6, MUTYH, NBN, NF1, NF2, NTHL1, PALB2, PDGFRA, PHOX2B, PMS2, POLD1, POLE, POT1, PRKAR1A, PTCH1, PTEN, RAD50, RAD51C, RAD51D, RB1, RECQL4, RET, RNF43, RUNX1, SDHAF2, SDHA (sequence changes only), SDHB, SDHC, SDHD, SMAD4, SMARCA4, SMARCB1, SMARCE1, STK11, SUFU, TERC, TERT, TMEM127, TP53, TSC1, TSC2, VHL, WRN and WT1.     12/30/2018 -  Chemotherapy   The patient had DOXOrubicin (ADRIAMYCIN) chemo injection 102 mg, 60 mg/m2 = 102 mg, Intravenous,  Once, 4 of 4 cycles Dose modification: 50 mg/m2 (original dose 60 mg/m2, Cycle 3, Reason: Dose not tolerated) Administration: 102 mg (12/30/2018), 102 mg (01/15/2019), 86 mg (01/28/2019), 86 mg (02/11/2019) palonosetron (ALOXI) injection 0.25 mg, 0.25 mg, Intravenous,  Once, 6 of 8 cycles Administration: 0.25 mg (12/30/2018), 0.25 mg (02/26/2019), 0.25 mg (01/15/2019), 0.25 mg (01/28/2019), 0.25 mg (02/11/2019), 0.25 mg (03/18/2019) ondansetron (ZOFRAN) injection 8 mg, 8 mg (100 % of original dose 8 mg), Intravenous,  Once, 2 of 4 cycles Dose modification: 8 mg (original dose 8 mg, Cycle 5) Administration: 8 mg (03/05/2019), 8 mg (03/12/2019), 8 mg (03/26/2019) pegfilgrastim-cbqv (UDENYCA) injection 6 mg, 6 mg, Subcutaneous, Once, 4 of 4 cycles Administration: 6 mg (01/01/2019), 6 mg (01/17/2019), 6 mg (01/30/2019), 6 mg (02/13/2019) CARBOplatin (PARAPLATIN) 600 mg in sodium chloride 0.9 % 250 mL chemo infusion, 600 mg (120.7 % of original dose 500.4 mg), Intravenous,  Once, 2 of 4 cycles Dose modification:   (original dose 500.4 mg, Cycle 5) Administration: 600 mg (02/26/2019), 600 mg (03/18/2019) cyclophosphamide (CYTOXAN)  1,020 mg in sodium chloride 0.9 % 250 mL chemo infusion,  600 mg/m2 = 1,020 mg, Intravenous,  Once, 4 of 4 cycles Dose modification: 500 mg/m2 (original dose 600 mg/m2, Cycle 3, Reason: Dose not tolerated) Administration: 1,020 mg (12/30/2018), 1,020 mg (01/15/2019), 860 mg (01/28/2019), 860 mg (02/11/2019) PACLitaxel (TAXOL) 138 mg in sodium chloride 0.9 % 250 mL chemo infusion (</= 31m/m2), 80 mg/m2 = 138 mg, Intravenous,  Once, 2 of 4 cycles Dose modification: 65 mg/m2 (original dose 80 mg/m2, Cycle 6, Reason: Dose not tolerated) Administration: 138 mg (02/26/2019), 138 mg (03/05/2019), 138 mg (03/12/2019), 138 mg (03/18/2019), 138 mg (03/26/2019) fosaprepitant (EMEND) 150 mg, dexamethasone (DECADRON) 12 mg in sodium chloride 0.9 % 145 mL IVPB, , Intravenous,  Once, 6 of 8 cycles Administration:  (12/30/2018),  (02/26/2019),  (01/15/2019),  (01/28/2019),  (02/11/2019),  (03/18/2019)  for chemotherapy treatment.    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   06/01/2019 Relapse/Recurrence   Patient palpated multiple nodules in her right chest wall. UKore[place]owed 4 indeterminate masses just inferior to the mastectomy scar. 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 with above-mentioned history of triple negative right breast cancerwith recent cutaneous recurrence. Kara Pittman is a participant in the [employer] clinical trial. Kara Pittman presents to the clinic todayto discuss treatment options.  Kara Pittman came in today complaining that the dizziness headaches and severe neck and head pain have gotten worse. The tumors on the chest wall  are rapidly progressing.  Kara Pittman is noticing multiple new spots originate in the last week.  Kara Pittman denies any lower extremity or upper extremity weakness.  But the headaches and dizziness are very concerning for metastatic disease to the brain.  ALLERGIES:  has No Known Allergies.  MEDICATIONS:  Current Outpatient Medications  Medication Sig Dispense Refill  . acetaminophen (TYLENOL) 500 MG tablet Take 500-1,000 mg by mouth every 6 (six) hours as needed for mild pain or headache.    . b complex vitamins tablet Take 1 tablet by mouth at bedtime.    . bimatoprost (LATISSE) 0.03 % ophthalmic solution Place 0.03 mLs into both eyes at bedtime. Place one drop on applicator and apply evenly along the skin of the upper eyelid at base of eyelashes once daily at bedtime; repeat procedure for second eye (use a clean applicator). 5 mL 3  . buPROPion (WELLBUTRIN XL) 150 MG 24 hr tablet Take 1 tablet by mouth daily (Patient taking differently: Take 150 mg by mouth in the morning. ) 90 tablet 3  . CALCIUM PO Take 1 tablet by mouth at bedtime.    . docusate sodium (COLACE) 100 MG capsule Take 1 capsule (100 mg total) by mouth 2 (two) times daily. 20 capsule 0  . gabapentin (NEURONTIN) 100 MG capsule Take 1 capsule (100 mg total) by mouth 2 (two) times daily. 60 capsule 0  . lidocaine-prilocaine (EMLA) cream Apply to affected area once 30 g 3  . LORazepam (ATIVAN) 0.5 MG tablet Take 1 tablet (0.5 mg total) by mouth at bedtime as needed for sleep. 30 tablet 0  .  methocarbamol (ROBAXIN) 500 MG tablet Take 1 tablet (500 mg total) by mouth every 6 (six) hours as needed for muscle spasms. 20 tablet 0  . Multiple Vitamins-Calcium (ONE-A-DAY WOMENS FORMULA) TABS Take 1 tablet by mouth at bedtime.    . ondansetron (ZOFRAN-ODT) 8 MG disintegrating tablet Take 1 tablet (8 mg total) by mouth every 8 (eight) hours as needed for nausea or vomiting. (Patient taking differently: Take 8 mg by mouth every 8 (eight) hours as needed for  nausea or vomiting (DISSOLVE ORALLY). ) 20 tablet 3  . oxyCODONE (OXY IR/ROXICODONE) 5 MG immediate release tablet Take 1-2 tablets (5-10 mg total) by mouth every 4 (four) hours as needed for moderate pain. 30 tablet 0  . pantoprazole (PROTONIX) 40 MG tablet TAKE 1 TABLET BY MOUTH ONCE DAILY 30 tablet 0  . prochlorperazine (COMPAZINE) 10 MG tablet TAKE 1 TABLET BY MOUTH EVERY 6 HOURS AS NEEDED FOR NAUSEA & VOMITING 30 tablet 1  . promethazine (PHENERGAN) 25 MG suppository Place 1 suppository (25 mg total) rectally every 6 (six) hours as needed for nausea or vomiting. 12 each 1  . SYNTHROID 75 MCG tablet Take 1 tablet by mouth once daily (Patient taking differently: Take 75 mcg by mouth daily before breakfast. ) 90 tablet 3   No current facility-administered medications for this visit.    PHYSICAL EXAMINATION: ECOG PERFORMANCE STATUS: 1 - Symptomatic but completely ambulatory  Vitals:   06/11/19 1012  BP: 114/84  Pulse: 79  Resp: 18  Temp: 98.3 F (36.8 C)  SpO2: 100%   Filed Weights   06/11/19 1012  Weight: 151 lb 1.6 oz (68.5 kg)      LABORATORY DATA:  I have reviewed the data as listed CMP Latest Ref Rng & Units 04/09/2019 04/02/2019 03/26/2019  Glucose 70 - 99 mg/dL 95 93 87  BUN 6 - 20 mg/dL _0 Creatinine 0.44 - 1.00 mg/dL 0.75 0.86 0.74  Sodium 135 - 145 mmol/L 141 144 140  Potassium 3.5 - 5.1 mmol/L 4.1 4.3 4.1  Chloride 98 - 111 mmol/L 108 108 108  CO2 22 - 32 mmol/L _1 Calcium 8.9 - 10.3 mg/dL 9.1 9.4 8.9  Total Protein 6.5 - 8.1 g/dL 7.1 7.0 6.7  Total Bilirubin 0.3 - 1.2 mg/dL <0.2(L) 0.3 0.2(L)  Alkaline Phos 38 - 126 U/L 83 79 66  AST 15 - 41 U/L _2 ALT 0 - 44 U/L 23 48(H) 31    Lab Results  Component Value Date   WBC 6.7 04/22/2019   HGB 10.6 (L) 04/22/2019   HCT 32.3 (L) 04/22/2019   MCV 100.3 (H) 04/22/2019   PLT 331 04/22/2019   NEUTROABS 1.7 04/09/2019    ASSESSMENT & PLAN:  Malignant neoplasm of lower-inner quadrant of right  breast of female, estrogen receptor negative ([place]) 12/12/2018:Patient palpated right breast mass. Mammogram showed a 3.3cm solid and cystic right breast mass, 6 o'clock position, no abnormal right axillary lymph nodes. 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Neoadjuvant chemotherapy with dose dense Adriamycin and Cytoxan x4 followed by Taxol weekly x6discontinued because of worsening breast symptomson 03/26/2019 2.Mastectomy with sentinel lymph node biopsy3/16/2021: 7 cm metaplastic carcinoma, clear margins, 4 lymph nodes negative 3.adjuvant radiation Upbeat clinical trial: No adverse effects due to participation in the study ----------------------------------------------------------------------------------------------------------------------------------------------------- Right mastectomy: 7 cm of metaplastic carcinoma grade 2, margins uninvolved, tumor  directly invades the dermis 0/4lymph nodes negative. Triple negative with a Ki-67 of 15%  Sub-cutaneous recurrence: Unfortunately Kara Pittman is not a surgical candidate because of the amount of tissue that would need to be removed which would make it difficult to close the area.  Plan: Second opinion consultation at [facility] on 06/15/2019. PET CT scan 06/11/2019: 15-20 subcutaneous metastases on the right chest wall and axilla.  6 mm left lung nodule does not have adequate uptake on PET scan.  Uncertain if it is metastatic disease.  Confusion and headaches and neck pain: With the current evidence of recurrence, we are very concerned about brain metastases.  We would like to order a stat brain MRI for further evaluation.    Patient is extremely worried about rapid progression of her disease.  Kara Pittman had multiple questions about survival.  If the lung nodule is truly metastatic then Kara Pittman is in dire straits with this type of breast cancer.  We  discussed the role of Trodelvy or immunotherapy.  I also send a message to Dr. Wetzel Bjornstad regarding this recent update.  It is likely that the patient will need systemic therapy/radiation.  Patient wishes to have surgery but I do not see how that is even possible given the current extent of her disease.  Kara Pittman will call us on Monday after her appointment at [facility] to discuss the plan.    Orders Placed This Encounter  Procedures  . MR BRAIN W WO CONTRAST    Standing Status:   Future    Standing Expiration Date:   08/10/2020    Order Specific Question:   ** REASON FOR EXAM (FREE TEXT)    Answer:   Metastatic breast cancer with the severe headache, dizziness    Order Specific Question:   If indicated for the ordered procedure, I authorize the administration of contrast media per Radiology protocol    Answer:   Yes    Order Specific Question:   What is the patient's sedation requirement?    Answer:   No Sedation    Order Specific Question:   Does the patient have a pacemaker or implanted devices?    Answer:   No    Order Specific Question:   Use SRS Protocol?    Answer:   Yes    Order Specific Question:   Radiology Contrast Protocol - do NOT remove file path    Answer:   [email or web address]    Order Specific Question:   Preferred imaging location?    Answer:   [facility] [place] (table limit - 550 lbs)    Order Specific Question:   Release to patient    Answer:   Immediate   The patient has a good understanding of the overall plan. Kara Pittman agrees with it. Kara Pittman will call with any problems that may develop before the next visit here.  Total time spent: 45 mins including face to face time and time spent for planning, charting and coordination of care  [name], MD 06/11/2019  I, Cloyde Reams Dorshimer, am acting as scribe for Dr. [name].  I have reviewed the above documentation for accuracy and completeness, and I agree with the above.

## 2019-06-11 ENCOUNTER — Ambulatory Visit (HOSPITAL_COMMUNITY): Payer: Medicaid Other

## 2019-06-11 ENCOUNTER — Ambulatory Visit: Payer: BC Managed Care – PPO

## 2019-06-11 ENCOUNTER — Inpatient Hospital Stay: Payer: BC Managed Care – PPO | Attending: Hematology and Oncology | Admitting: Hematology and Oncology

## 2019-06-11 ENCOUNTER — Other Ambulatory Visit: Payer: Self-pay

## 2019-06-11 ENCOUNTER — Ambulatory Visit (HOSPITAL_COMMUNITY)
Admission: RE | Admit: 2019-06-11 | Discharge: 2019-06-11 | Disposition: A | Discharge status: Home / Self Care | Payer: BC Managed Care – PPO | Source: Ambulatory Visit | Attending: Radiation Oncology | Admitting: Radiation Oncology

## 2019-06-11 ENCOUNTER — Ambulatory Visit (HOSPITAL_COMMUNITY)
Admission: RE | Admit: 2019-06-11 | Discharge: 2019-06-11 | Disposition: A | Discharge status: Home / Self Care | Payer: BC Managed Care – PPO | Source: Ambulatory Visit | Attending: Hematology and Oncology | Admitting: Hematology and Oncology

## 2019-06-11 ENCOUNTER — Encounter: Payer: Self-pay | Admitting: *Deleted

## 2019-06-11 VITALS — BP 114/84 | HR 79 | Temp 98.3°F | Resp 18 | Ht 65.0 in | Wt 151.1 lb

## 2019-06-11 DIAGNOSIS — C50919 Malignant neoplasm of unspecified site of unspecified female breast: Secondary | ICD-10-CM | POA: Diagnosis not present

## 2019-06-11 DIAGNOSIS — Z171 Estrogen receptor negative status [ER-]: Secondary | ICD-10-CM

## 2019-06-11 DIAGNOSIS — R519 Headache, unspecified: Secondary | ICD-10-CM | POA: Insufficient documentation

## 2019-06-11 DIAGNOSIS — R410 Disorientation, unspecified: Secondary | ICD-10-CM | POA: Insufficient documentation

## 2019-06-11 DIAGNOSIS — C50311 Malignant neoplasm of lower-inner quadrant of right female breast: Secondary | ICD-10-CM | POA: Insufficient documentation

## 2019-06-11 DIAGNOSIS — Z9011 Acquired absence of right breast and nipple: Secondary | ICD-10-CM | POA: Diagnosis not present

## 2019-06-11 DIAGNOSIS — C434 Malignant melanoma of scalp and neck: Secondary | ICD-10-CM | POA: Insufficient documentation

## 2019-06-11 DIAGNOSIS — Z9221 Personal history of antineoplastic chemotherapy: Secondary | ICD-10-CM | POA: Insufficient documentation

## 2019-06-11 DIAGNOSIS — M542 Cervicalgia: Secondary | ICD-10-CM | POA: Insufficient documentation

## 2019-06-11 DIAGNOSIS — Z79899 Other long term (current) drug therapy: Secondary | ICD-10-CM | POA: Diagnosis not present

## 2019-06-11 LAB — GLUCOSE, CAPILLARY: Glucose-Capillary: 98 mg/dL (ref 70–99)

## 2019-06-11 IMAGING — PT NM PET TUM IMG INITIAL (PI) WHOLE BODY
1 of 7 series · 4 of 25 positions shown · non-contrast
Comparison: CT [DATE]

CLINICAL DATA: Subsequent treatment strategy for breast carcinoma.

EXAM:
NUCLEAR MEDICINE PET WHOLE BODY
TECHNIQUE: 7.4 mCi F-18 FDG was injected intravenously. Full-ring PET imaging
was performed from the skull base to thigh after the radiotracer. CT
data was obtained and used for attenuation correction and anatomic
localization.
Fasting blood glucose: 98 mg/dl

[Series 4: ct wb 5.0 hd_fov · axial · 5.0mm · 1.23mm/px · z∈[+413,+1469]mm · 4 of 442 slices shown]
[im 89/442  soft-tissue]
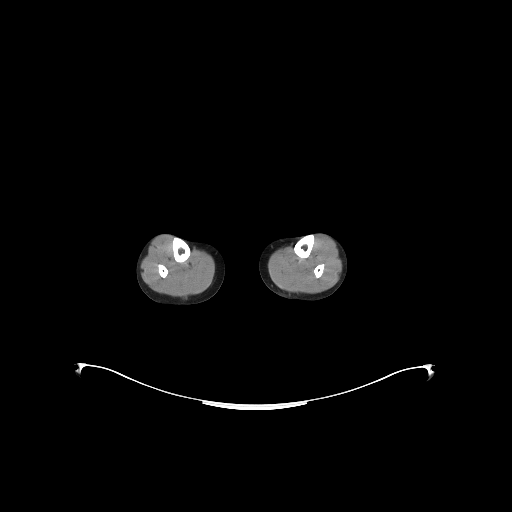
[im 177/442  soft-tissue]
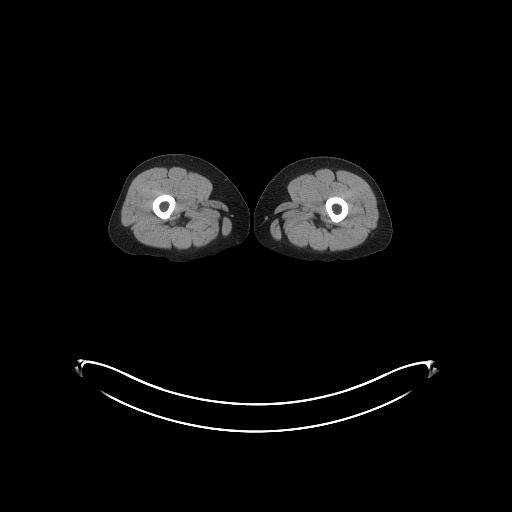
[im 265/442  soft-tissue]
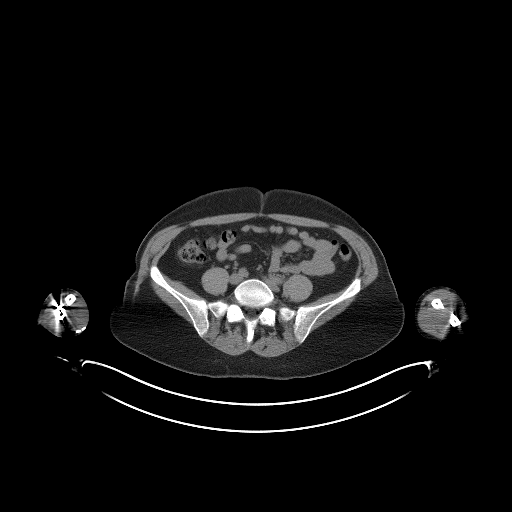
[im 353/442  soft-tissue]
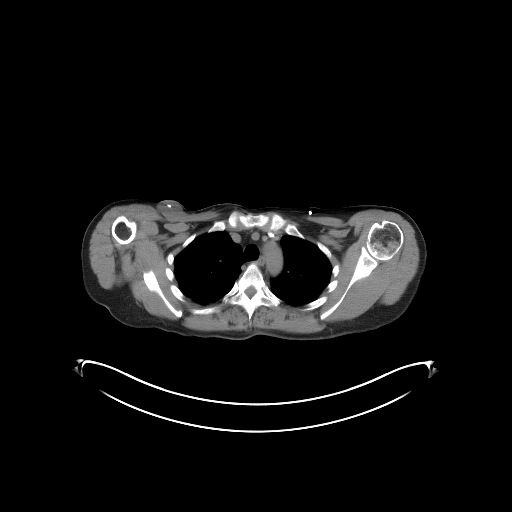

[4 of 25 positions shown; findings below may reference images not displayed]

FINDINGS: Mediastinal blood pool activity: SUV max

HEAD/NECK: No hypermetabolic activity in the scalp. No
hypermetabolic cervical lymph nodes.

Incidental CT findings: none

CHEST: There is new nodularity along the surface of the RIGHT
pectoralis muscle at the mastectomy site. Approximately 15-20 new
nodular sites. These nodules are intensely hypermetabolic on FDG PET
imaging.

High lateral RIGHT pectoralis nodule measures 1.2 cm in depth (image
90/4) with SUV max equal 12.9.

Smaller nodules studded along the surface of the muscle. For example
nodule the medial aspect of the pectoralis muscle adjacent the
sternum measuring 9 mm with SUV max equal 13.

Nodule adjacent to the surgical clips on image 116/4 surgical clips
SUV max equal 18.7.

Inferior laterally toward the axilla there several nodules which are
large measuring 1.4 and 1.4 cm image 120/4 with SUV max equal 18.7.

The superior segment of the RIGHT lower lobe there is a new 6 mm
nodule (image [DATE]) which has mild but measurable metabolic activity
with SUV max equal 2.3.

Incidental CT findings: No suspicious pulmonary nodules.

No hypermetabolic axillary nodes. No central thoracic hypermetabolic
lymph nodes.

ABDOMEN/PELVIS: No abnormal hypermetabolic activity within the
liver, pancreas, adrenal glands, or spleen. No hypermetabolic lymph
nodes in the abdomen or pelvis.

Incidental CT findings: Large RIGHT renal cysts.  Post hysterectomy.

SKELETON: No focal hypermetabolic activity to suggest skeletal
metastasis.

Incidental CT findings: none

EXTREMITIES: No abnormal hypermetabolic activity in the lower
extremities.

Incidental CT findings: none
IMPRESSION: 1. Multiple new hypermetabolic nodules along the surface of the
RIGHT pectoralis muscle at mastectomy site. Nodules are numerous
(15-20) and intensely hypermetabolic most consistent with breast
cancer recurrence.
2. New hypermetabolic pulmonary nodule superior aspect of the LEFT
lower lobe most consistent with breast cancer metastasis.
3. No hypermetabolic axial lymph nodes or central thoracic lymph
nodes.
4. No evidence distant metastatic disease.

## 2019-06-11 IMAGING — MR MR HEAD WO/W CM
12 series · 48 of 48 positions shown · IV contrast (multihance)
Comparison: None.

CLINICAL DATA: Metastatic breast cancer

EXAM:
MRI HEAD WITHOUT AND WITH CONTRAST
TECHNIQUE: Multiplanar, multiecho pulse sequences of the brain and surrounding
structures were obtained without and with intravenous contrast.
CONTRAST:  7.5mL MULTIHANCE GADOBENATE DIMEGLUMINE 529 MG/ML IV SOLN

[Series 9: T1 · sagittal · 3.0mm · 0.75mm/px · 1 of 39 slices shown (1 of 2)]
[im 1/39]
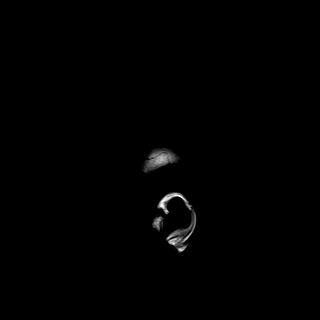

[Series 10: DWI · axial · 3.0mm · 2.09mm/px · z∈[-60,+99]mm · 6 of 107 slices shown (1 of 2)]
[im 1/107]
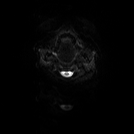
[im 22/107]
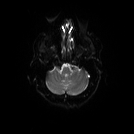
[im 43/107]
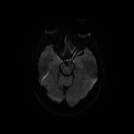
[im 64/107]
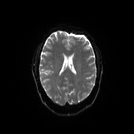
[im 85/107]
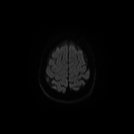
[im 107/107]
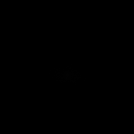

[Series 11: DWI · axial · 3.0mm · 2.09mm/px · z∈[-60,+96]mm · 3 of 53 slices shown (2 of 2)]
[im 1/53]
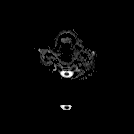
[im 27/53]
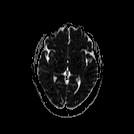
[im 53/53]
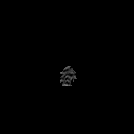

[Series 12: mip_images(sw) · axial · 24.0mm · 0.86mm/px · z∈[-55,+89]mm · 3 of 49 slices shown]
[im 1/49]
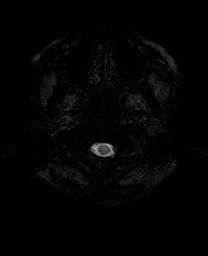
[im 25/49]
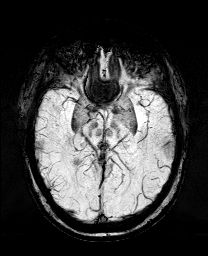
[im 49/49]
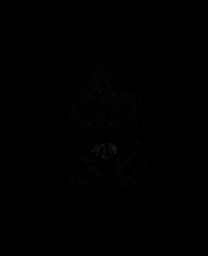

[Series 13: swi_images · axial · 3.0mm · 0.86mm/px · z∈[-66,+99]mm · 3 of 56 slices shown]
[im 1/56]
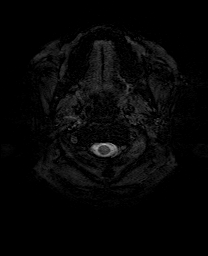
[im 28/56]
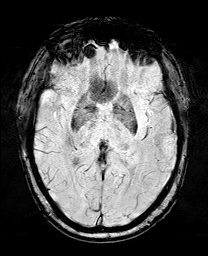
[im 56/56]
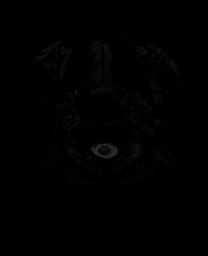

[Series 14: T2 · axial · 5.0mm · 0.57mm/px · 1 of 27 slices shown]
[im 1/27]
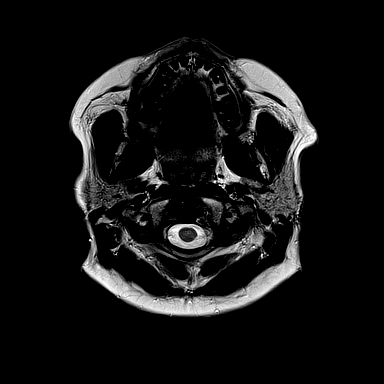

[Series 15: FLAIR · axial · 3.0mm · 0.57mm/px · z∈[-57,+102]mm · 3 of 54 slices shown]
[im 1/54]
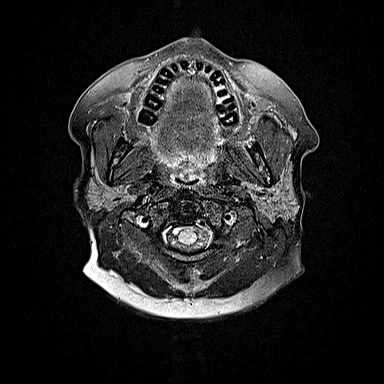
[im 27/54]
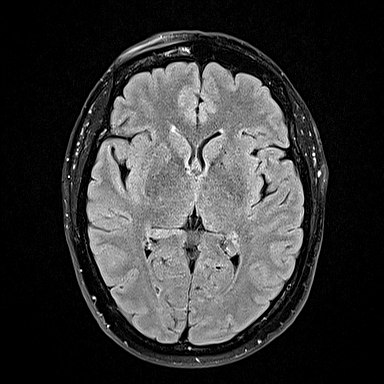
[im 54/54]
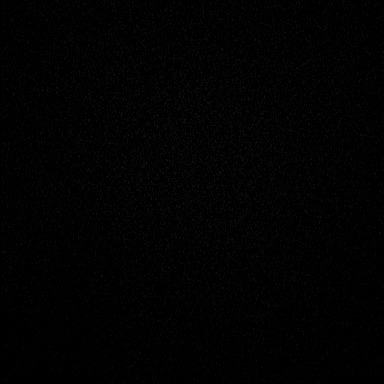

[Series 16: T1 · axial · 1.0mm · 0.94mm/px · z∈[-66,+109]mm · 10 of 176 slices shown (2 of 2)]
[im 1/176]
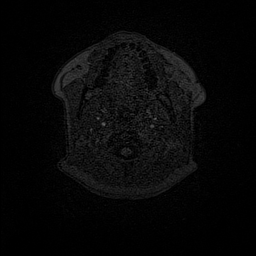
[im 20/176]
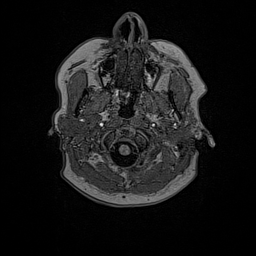
[im 39/176]
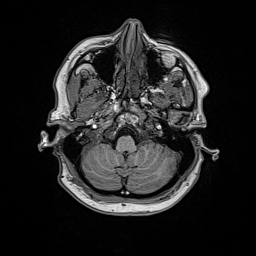
[im 59/176]
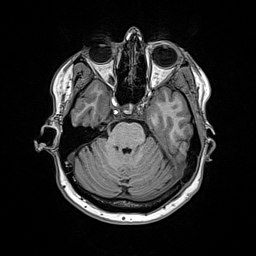
[im 78/176]
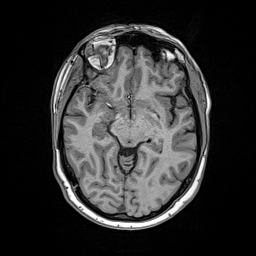
[im 98/176]
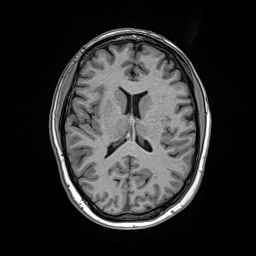
[im 117/176]
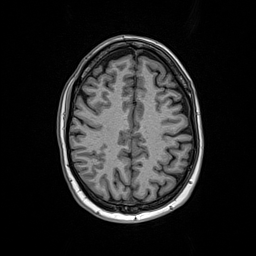
[im 137/176]
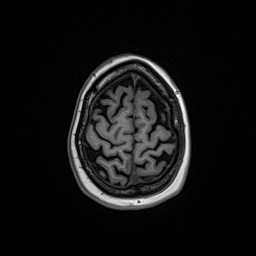
[im 156/176]
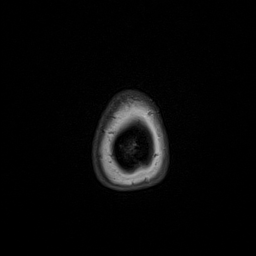
[im 176/176]
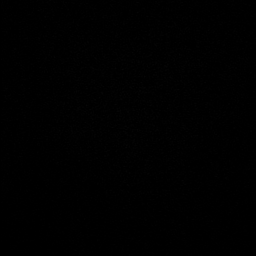

[Series 17: T2 post-contrast · coronal · 3.0mm · 0.57mm/px · 3 of 50 slices shown]
[im 1/50]
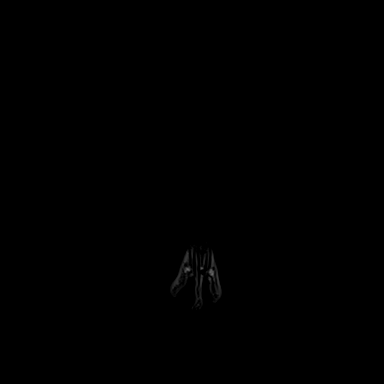
[im 25/50]
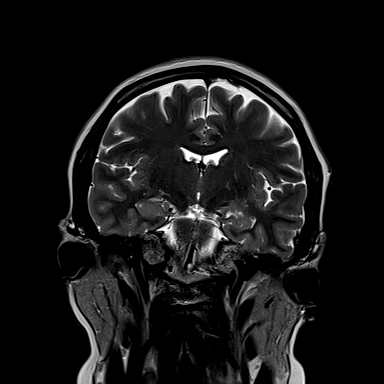
[im 50/50]
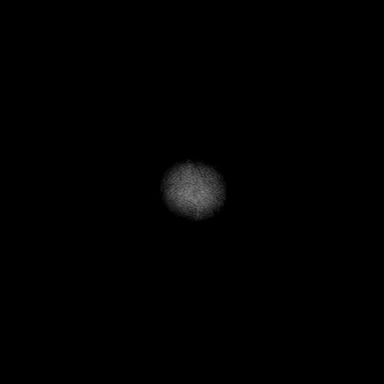

[Series 18: T1 post-contrast · axial · 1.0mm · 0.94mm/px · z∈[-66,+109]mm · 10 of 176 slices shown (1 of 3)]
[im 1/176]
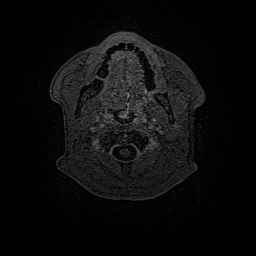
[im 20/176]
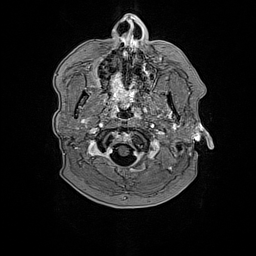
[im 39/176]
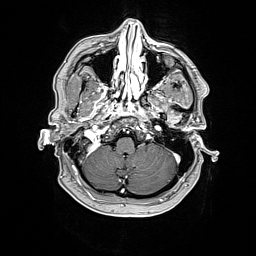
[im 59/176]
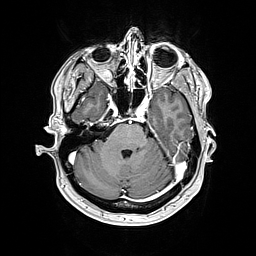
[im 78/176]
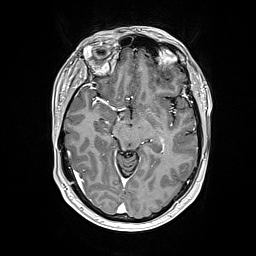
[im 98/176]
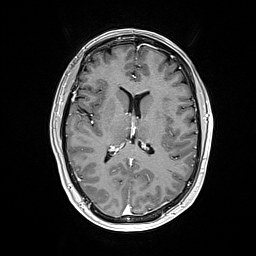
[im 117/176]
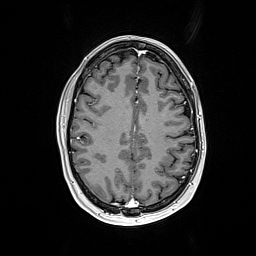
[im 137/176]
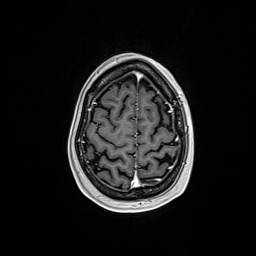
[im 156/176]
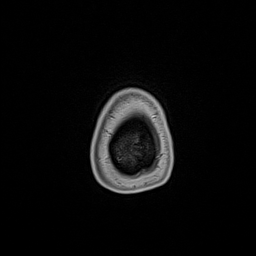
[im 176/176]
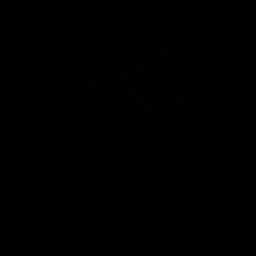

[Series 19: T1 post-contrast · coronal · 3.0mm · 0.57mm/px · 3 of 50 slices shown (2 of 3)]
[im 1/50]
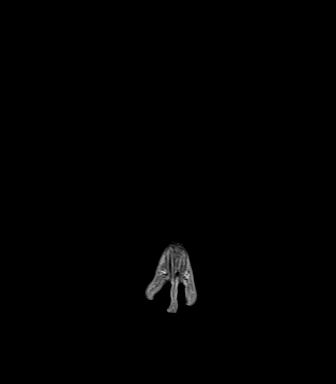
[im 25/50]
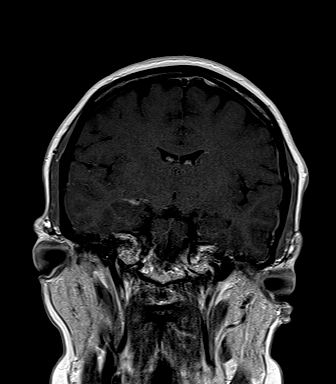
[im 50/50]
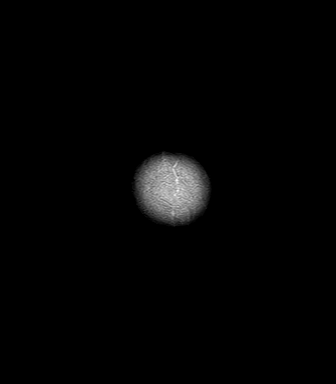

[Series 20: T1 post-contrast · sagittal · 3.0mm · 0.75mm/px · 2 of 39 slices shown (3 of 3)]
[im 1/39]
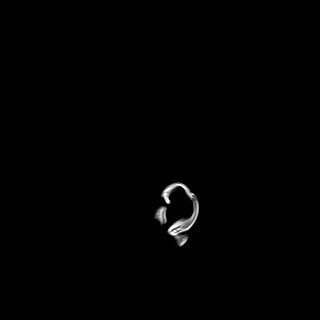
[im 39/39]
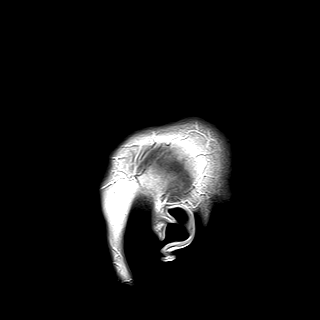

[48 of 48 positions shown; findings below may reference images not displayed]

FINDINGS: Brain: There is no acute infarction or intracranial hemorrhage.
There is no intracranial mass, mass effect, or edema. There is no
hydrocephalus or extra-axial fluid collection. Ventricles and sulci
are normal in size and configuration. No abnormal enhancement.

Vascular: Major vessel flow voids at the skull base are preserved.

Skull and upper cervical spine: Normal marrow signal is preserved.

Sinuses/Orbits: Minor mucosal thickening.  Orbits are unremarkable.

Other: Sella is unremarkable.  Mastoid air cells are clear.
IMPRESSION: No evidence of intracranial metastatic disease.

## 2019-06-11 MED ORDER — HEPARIN SOD (PORK) LOCK FLUSH 100 UNIT/ML IV SOLN
INTRAVENOUS | Status: AC
  Filled 2019-06-11: qty 5

## 2019-06-11 MED ORDER — FLUDEOXYGLUCOSE F - 18 (FDG) INJECTION
7.4000 | Freq: Once | INTRAVENOUS | Status: AC | PRN
  Administered 2019-06-11: 7.4 via INTRAVENOUS

## 2019-06-11 MED ORDER — GADOBENATE DIMEGLUMINE 529 MG/ML IV SOLN
7.5000 mL | Freq: Once | INTRAVENOUS | Status: AC | PRN
  Administered 2019-06-11: 7.5 mL via INTRAVENOUS

## 2019-06-11 NOTE — Assessment & Plan Note (Signed)
12/12/2018:Patient palpated right breast mass. Mammogram showed a 3.3cm solid and cystic right breast mass, 6 o'clock position, no abnormal right axillary lymph nodes. 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Neoadjuvant chemotherapy with dose dense Adriamycin and Cytoxan x4 followed by Taxol weekly x6discontinued because of worsening breast symptomson 03/26/2019 2.Mastectomy with sentinel lymph node biopsy3/16/2021: 7 cm metaplastic carcinoma, clear margins, 4 lymph nodes negative 3.adjuvant radiation Upbeat clinical trial: No adverse effects due to participation in the study ----------------------------------------------------------------------------------------------------------------------------------------------------- Right mastectomy: 7 cm of metaplastic carcinoma grade 2, margins uninvolved, tumor directly invades the dermis 0/4lymph nodes negative. Triple negative with a Ki-67 of 15%  Sub-cutaneous recurrence: Unfortunately she is not a surgical candidate because of the amount of tissue that would need to be removed which would make it difficult to close the area.  Plan: Second opinion consultation at [facility] on 06/15/2019.  Confusion and headaches: With the current evidence of recurrence, we are very concerned about brain metastases.  We would like to order a stat brain MRI for further evaluation.    Return to clinic based upon the results of the above tests.

## 2019-06-12 ENCOUNTER — Other Ambulatory Visit: Payer: Self-pay | Admitting: Hematology and Oncology

## 2019-06-12 ENCOUNTER — Ambulatory Visit: Payer: BC Managed Care – PPO

## 2019-06-12 MED FILL — DIFFERIN 0.1% CREAM: 0.1 | 30 days supply | Qty: 45 | Fill #0

## 2019-06-14 ENCOUNTER — Ambulatory Visit: Payer: BC Managed Care – PPO

## 2019-06-15 ENCOUNTER — Ambulatory Visit
Admission: RE | Admit: 2019-06-15 | Discharge: 2019-06-15 | Disposition: A | Discharge status: Home / Self Care | Payer: BC Managed Care – PPO | Source: Ambulatory Visit | Attending: Radiation Oncology | Admitting: Radiation Oncology

## 2019-06-15 ENCOUNTER — Ambulatory Visit: Payer: BC Managed Care – PPO

## 2019-06-15 DIAGNOSIS — Z171 Estrogen receptor negative status [ER-]: Secondary | ICD-10-CM | POA: Diagnosis not present

## 2019-06-15 DIAGNOSIS — C50311 Malignant neoplasm of lower-inner quadrant of right female breast: Secondary | ICD-10-CM | POA: Insufficient documentation

## 2019-06-15 DIAGNOSIS — C50811 Malignant neoplasm of overlapping sites of right female breast: Secondary | ICD-10-CM | POA: Diagnosis not present

## 2019-06-16 ENCOUNTER — Ambulatory Visit
Admission: RE | Admit: 2019-06-16 | Discharge: 2019-06-16 | Disposition: A | Discharge status: Home / Self Care | Payer: BC Managed Care – PPO | Source: Ambulatory Visit | Attending: Radiation Oncology | Admitting: Radiation Oncology

## 2019-06-16 ENCOUNTER — Ambulatory Visit: Payer: BC Managed Care – PPO

## 2019-06-16 ENCOUNTER — Other Ambulatory Visit: Payer: Self-pay

## 2019-06-16 DIAGNOSIS — C50311 Malignant neoplasm of lower-inner quadrant of right female breast: Secondary | ICD-10-CM

## 2019-06-16 DIAGNOSIS — Z171 Estrogen receptor negative status [ER-]: Secondary | ICD-10-CM | POA: Diagnosis present

## 2019-06-16 NOTE — Progress Notes (Signed)
Radiation Oncology         [phone] ________________________________  Name: Kara Pittman MRN: [id number]  Date: 06/16/2019  DOB: 1965/11/14  SIMULATION AND TREATMENT PLANNING NOTE-resimulation    ICD-10-CM   1. 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  Patient identity was confirmed confirmed.  All relevant records and images related to the planned course of therapy were reviewed.  The patient freely provided informed written consent to proceed with treatment after reviewing the details related to the planned course of therapy. The consent form was witnessed and verified by the simulation staff.  Then, the patient was set-up in a stable reproducible supine position for radiation therapy.  CT images were obtained.  Surface markings were placed.  The CT images were loaded into the planning software.  Then the target and avoidance structures were contoured.  Treatment planning then occurred.  The radiation prescription was entered and confirmed.  Then, I designed and supervised the construction of a total of 7 medically necessary complex treatment devices.  I have requested : 3D Simulation  I have requested a DVH of the following structures: heart, lungs, GTV.  I have ordered:dose calc.  PLAN:  The patient will receive 50 Gy in 25 fractions directed to the right chest wall, axillary and supraclavicular region.  Bolus will be used daily on all the areas of palpable recurrence to ensure adequate dose to the superficial aspects of the target area.  PET scan showed 15-20 lesions along the chest wall.  after 50 Gy the patient will then likely proceed with a boost to the significant areas of recurrence with anticipated  dose to 60-64 Gray.  In addition the patient will be receiving radiosensitizing chemotherapy under the direction of Dr. Wetzel Bjornstad at [facility].  Patient is to take Capecitabine 1500 mg twice daily on radiation treatment days.   Optical Surface Tracking Plan:  Since intensity modulated radiotherapy (IMRT) and 3D conformal radiation treatment methods are predicated on accurate and precise positioning for treatment, intrafraction motion monitoring is medically necessary to ensure accurate and safe treatment delivery.  The ability to quantify intrafraction motion without excessive ionizing radiation dose can only be performed with optical surface tracking. Accordingly, surface imaging offers the opportunity to obtain 3D measurements of patient position throughout IMRT and 3D treatments without excessive radiation exposure.  I am ordering optical surface tracking for this patient's upcoming course of radiotherapy. ________________________________  Present returns today for re-simulation.  She initially underwent simulation on April 19 to begin postmastectomy radiation therapy with treatment start approximately 5 to 6 weeks postop.  At the time of the patient's simulation there was concern for possible recurrence.  The patient underwent ultrasound and eventual biopsy of the suspicious lesions confirming recurrence in an extremely short period of time after her mastectomy.  Patient was seen by general surgery and given the extent of recurrence is not felt to be a candidate for surgery.  Given his unusual rapid progression after surgery was referred to [facility] for consultation.  Patient with seen yesterday by surgical oncology, medical oncology and radiation oncology.  Patient was not felt to be a candidate for surgery given the extent of recurrence and recommendations were for proceeding with postmastectomy radiation therapy along with radiosensitizing Xeloda.  After completion of this therapy  consideration for targeted therapy depending on additional testing.  Given the patient's residence in [place] she elected to receive her radiation therapy here in [place] will continue to proceed with chemotherapy with Dr. Wetzel Bjornstad at [facility].  I discussed with patient today that we expect a significant skin reaction over the areas of recurrence especially with her receiving capecitabine.  Anticipate she will develop significant moist desquamation.  Patient will be watched closely while under treatment and will be seen on a weekly basis by Dr. Wetzel Bjornstad at [facility] in addition.    Treatment planning CT scan today showed 2 suspicious areas in the lung consistent with metastatic disease.  PET scan also was very concerning for lung metastasis.   Patient will start her postmastectomy radiation therapy on May 13.  She may also start her capecitabine if approved by her insurance company at that time.     -----------------------------------  Blair Promise, PhD, MD  This document serves as a record of services personally performed by [name], MD. It was created on his behalf by Clerance Lav, a trained medical scribe. The creation of this record is based on the scribe's personal observations and the provider's statements to them. This document has been checked and approved by the attending provider.

## 2019-06-17 ENCOUNTER — Ambulatory Visit: Payer: BC Managed Care – PPO

## 2019-06-17 DIAGNOSIS — L089 Local infection of the skin and subcutaneous tissue, unspecified: Secondary | ICD-10-CM | POA: Diagnosis not present

## 2019-06-17 DIAGNOSIS — C50911 Malignant neoplasm of unspecified site of right female breast: Secondary | ICD-10-CM | POA: Diagnosis not present

## 2019-06-17 NOTE — Progress Notes (Signed)
  Radiation Oncology         (336) [phone] ________________________________  Name: Kara Pittman MRN: [id number]  Date: 06/18/2019  DOB: 30-Jun-1965  Simulation Verification Note    ICD-10-CM   1. Malignant neoplasm of lower-inner quadrant of right breast of female, estrogen receptor negative ([place])  C50.311    Z17.1     NARRATIVE: The patient was brought to the treatment unit and placed in the planned treatment position. The clinical setup was verified. Then port films were obtained and uploaded to the radiation oncology medical record software.  The treatment beams were carefully compared against the planned radiation fields. The position location and shape of the radiation fields was reviewed. They targeted volume of tissue appears to be appropriately covered by the radiation beams. Organs at risk appear to be excluded as planned.  Based on my personal review, I approved the simulation verification. The patient's treatment will proceed as planned.  -----------------------------------  Blair Promise, PhD, MD  This document serves as a record of services personally performed by [name], MD. It was created on his behalf by Clerance Lav, a trained medical scribe. The creation of this record is based on the scribe's personal observations and the provider's statements to them. This document has been checked and approved by the attending provider.

## 2019-06-18 ENCOUNTER — Ambulatory Visit: Payer: BC Managed Care – PPO

## 2019-06-18 ENCOUNTER — Ambulatory Visit
Admission: RE | Admit: 2019-06-18 | Discharge: 2019-06-18 | Disposition: A | Discharge status: Home / Self Care | Payer: BC Managed Care – PPO | Source: Ambulatory Visit | Attending: Radiation Oncology | Admitting: Radiation Oncology

## 2019-06-18 DIAGNOSIS — Z171 Estrogen receptor negative status [ER-]: Secondary | ICD-10-CM

## 2019-06-18 DIAGNOSIS — C50311 Malignant neoplasm of lower-inner quadrant of right female breast: Secondary | ICD-10-CM | POA: Diagnosis not present

## 2019-06-19 ENCOUNTER — Ambulatory Visit
Admission: RE | Admit: 2019-06-19 | Discharge: 2019-06-19 | Disposition: A | Discharge status: Home / Self Care | Payer: BC Managed Care – PPO | Source: Ambulatory Visit | Attending: Radiation Oncology | Admitting: Radiation Oncology

## 2019-06-19 ENCOUNTER — Other Ambulatory Visit: Payer: Self-pay

## 2019-06-19 ENCOUNTER — Ambulatory Visit: Payer: BC Managed Care – PPO

## 2019-06-19 ENCOUNTER — Other Ambulatory Visit (HOSPITAL_COMMUNITY): Payer: BC Managed Care – PPO

## 2019-06-19 DIAGNOSIS — C50311 Malignant neoplasm of lower-inner quadrant of right female breast: Secondary | ICD-10-CM | POA: Diagnosis not present

## 2019-06-22 ENCOUNTER — Ambulatory Visit: Payer: BC Managed Care – PPO

## 2019-06-22 ENCOUNTER — Other Ambulatory Visit: Payer: Self-pay

## 2019-06-22 ENCOUNTER — Ambulatory Visit
Admission: RE | Admit: 2019-06-22 | Discharge: 2019-06-22 | Disposition: A | Discharge status: Home / Self Care | Payer: BC Managed Care – PPO | Source: Ambulatory Visit | Attending: Radiation Oncology | Admitting: Radiation Oncology

## 2019-06-22 DIAGNOSIS — C50311 Malignant neoplasm of lower-inner quadrant of right female breast: Secondary | ICD-10-CM | POA: Diagnosis not present

## 2019-06-22 DIAGNOSIS — Z171 Estrogen receptor negative status [ER-]: Secondary | ICD-10-CM | POA: Diagnosis not present

## 2019-06-22 DIAGNOSIS — C50911 Malignant neoplasm of unspecified site of right female breast: Secondary | ICD-10-CM | POA: Diagnosis not present

## 2019-06-22 MED FILL — UREA 10 % CREAM: 10 | 30 days supply | Qty: 85 | Fill #0

## 2019-06-23 ENCOUNTER — Other Ambulatory Visit: Payer: Self-pay | Admitting: Radiation Oncology

## 2019-06-23 ENCOUNTER — Ambulatory Visit
Admission: RE | Admit: 2019-06-23 | Discharge: 2019-06-23 | Disposition: A | Discharge status: Home / Self Care | Payer: BC Managed Care – PPO | Source: Ambulatory Visit | Attending: Radiation Oncology | Admitting: Radiation Oncology

## 2019-06-23 ENCOUNTER — Ambulatory Visit: Payer: BC Managed Care – PPO

## 2019-06-23 ENCOUNTER — Ambulatory Visit: Payer: BC Managed Care – PPO | Admitting: Radiation Oncology

## 2019-06-23 ENCOUNTER — Other Ambulatory Visit: Payer: Self-pay

## 2019-06-23 DIAGNOSIS — Z171 Estrogen receptor negative status [ER-]: Secondary | ICD-10-CM

## 2019-06-23 DIAGNOSIS — C50311 Malignant neoplasm of lower-inner quadrant of right female breast: Secondary | ICD-10-CM | POA: Diagnosis not present

## 2019-06-23 MED ORDER — SONAFINE EX EMUL
1.0000 "application " | Freq: Two times a day (BID) | CUTANEOUS | Status: DC
  Administered 2019-06-23: 1 via TOPICAL

## 2019-06-23 MED ORDER — OXYCODONE HCL 5 MG PO TABS: 5.0000 mg | ORAL_TABLET | ORAL | 0 refills | Status: DC | PRN

## 2019-06-23 MED ORDER — ALRA NON-METALLIC DEODORANT (RAD-ONC)
1.0000 "application " | Freq: Once | TOPICAL | Status: AC
  Administered 2019-06-23: 1 via TOPICAL

## 2019-06-24 ENCOUNTER — Ambulatory Visit
Admission: RE | Admit: 2019-06-24 | Discharge: 2019-06-24 | Disposition: A | Discharge status: Home / Self Care | Payer: BC Managed Care – PPO | Source: Ambulatory Visit | Attending: Radiation Oncology | Admitting: Radiation Oncology

## 2019-06-24 ENCOUNTER — Ambulatory Visit: Payer: BC Managed Care – PPO

## 2019-06-24 ENCOUNTER — Ambulatory Visit
Admission: RE | Admit: 2019-06-24 | Payer: BC Managed Care – PPO | Source: Ambulatory Visit | Admitting: Radiation Oncology

## 2019-06-24 ENCOUNTER — Other Ambulatory Visit: Payer: Self-pay

## 2019-06-24 DIAGNOSIS — C50311 Malignant neoplasm of lower-inner quadrant of right female breast: Secondary | ICD-10-CM

## 2019-06-24 DIAGNOSIS — Z171 Estrogen receptor negative status [ER-]: Secondary | ICD-10-CM

## 2019-06-24 MED FILL — oxyCODONE HCL 5 MG TABS: 5 | 5 days supply | Qty: 60 | Fill #0

## 2019-06-24 MED FILL — METHOCARBAMOL 500 MG TABS: 500 | 10 days supply | Qty: 30 | Fill #0

## 2019-06-24 NOTE — Progress Notes (Signed)
  Radiation Oncology         [phone] ________________________________  Name: Kara Pittman MRN: [id number]  Date: 06/24/2019  DOB: [birth year]  Weekly Radiation Therapy Management    ICD-10-CM   1. Malignant neoplasm of lower-inner quadrant of right breast of female, estrogen receptor negative ([place])  C50.311    Z17.1      Current Dose: 10 Gy     Planned Dose:  60+ Gy  Narrative . . . . . . . . The patient presents for routine under treatment assessment.                                   she is having increasing pain along the right chest and axillary area.  This is making it difficult for her to place her arm in the treatment position.  She is also starting to experience fatigue. she is taking oxycodone for pain relief and I recommended she take 2  5mg  tablets prior to her radiation therapy tomorrow.  She has started radiosensitizing capecitabine twice a day.  She undergoes weekly blood work and clinical assessment at [facility] with Dr. Wetzel Bjornstad.                                 Set-up films were reviewed.                                 The chart was checked. Physical Findings. . .  Lungs are clear to auscultation bilaterally. Heart has regular rate and rhythm. . Abdomen soft, non-tender, normal bowel sounds.  Treatment area as shown below.      Impression . . . . . . . The patient's tumor is nodules along the right chest wall have progressed significantly  over the past week despite initiation of treatment.  Hopefully with the addition of capecitabine we will start to see a response.  Another possible option would be to initiate radiation therapy twice a day. Plan . . . . . . . . . . . . Continue treatment as planned.  ________________________________   Blair Promise, PhD, MD

## 2019-06-25 ENCOUNTER — Ambulatory Visit
Admission: RE | Admit: 2019-06-25 | Discharge: 2019-06-25 | Disposition: A | Discharge status: Home / Self Care | Payer: BC Managed Care – PPO | Source: Ambulatory Visit | Attending: Radiation Oncology | Admitting: Radiation Oncology

## 2019-06-25 ENCOUNTER — Other Ambulatory Visit: Payer: Self-pay

## 2019-06-25 ENCOUNTER — Ambulatory Visit: Payer: BC Managed Care – PPO

## 2019-06-25 DIAGNOSIS — C50311 Malignant neoplasm of lower-inner quadrant of right female breast: Secondary | ICD-10-CM | POA: Diagnosis not present

## 2019-06-26 ENCOUNTER — Ambulatory Visit
Admission: RE | Admit: 2019-06-26 | Discharge: 2019-06-26 | Disposition: A | Discharge status: Home / Self Care | Payer: BC Managed Care – PPO | Source: Ambulatory Visit | Attending: Radiation Oncology | Admitting: Radiation Oncology

## 2019-06-26 ENCOUNTER — Other Ambulatory Visit: Payer: Self-pay

## 2019-06-26 ENCOUNTER — Ambulatory Visit: Payer: BC Managed Care – PPO

## 2019-06-26 DIAGNOSIS — C50311 Malignant neoplasm of lower-inner quadrant of right female breast: Secondary | ICD-10-CM | POA: Diagnosis not present

## 2019-06-29 ENCOUNTER — Other Ambulatory Visit: Payer: Self-pay

## 2019-06-29 ENCOUNTER — Ambulatory Visit
Admission: RE | Admit: 2019-06-29 | Discharge: 2019-06-29 | Disposition: A | Discharge status: Home / Self Care | Payer: BC Managed Care – PPO | Source: Ambulatory Visit | Attending: Radiation Oncology | Admitting: Radiation Oncology

## 2019-06-29 ENCOUNTER — Ambulatory Visit: Payer: BC Managed Care – PPO

## 2019-06-29 DIAGNOSIS — C50311 Malignant neoplasm of lower-inner quadrant of right female breast: Secondary | ICD-10-CM | POA: Diagnosis not present

## 2019-06-29 DIAGNOSIS — C50919 Malignant neoplasm of unspecified site of unspecified female breast: Secondary | ICD-10-CM | POA: Diagnosis not present

## 2019-06-29 DIAGNOSIS — C50811 Malignant neoplasm of overlapping sites of right female breast: Secondary | ICD-10-CM | POA: Diagnosis not present

## 2019-06-29 DIAGNOSIS — Z171 Estrogen receptor negative status [ER-]: Secondary | ICD-10-CM | POA: Diagnosis not present

## 2019-06-29 MED FILL — LIDOCAINE-PRILOCAINE CREAM: 2.5-2.5 | 30 days supply | Qty: 30 | Fill #0

## 2019-06-30 ENCOUNTER — Other Ambulatory Visit: Payer: Self-pay | Admitting: Radiation Oncology

## 2019-06-30 ENCOUNTER — Ambulatory Visit
Admission: RE | Admit: 2019-06-30 | Discharge: 2019-06-30 | Disposition: A | Discharge status: Home / Self Care | Payer: BC Managed Care – PPO | Source: Ambulatory Visit | Attending: Radiation Oncology | Admitting: Radiation Oncology

## 2019-06-30 ENCOUNTER — Other Ambulatory Visit: Payer: Self-pay

## 2019-06-30 ENCOUNTER — Ambulatory Visit: Payer: BC Managed Care – PPO | Admitting: Radiation Oncology

## 2019-06-30 ENCOUNTER — Encounter: Payer: Self-pay | Admitting: Licensed Clinical Social Worker

## 2019-06-30 ENCOUNTER — Ambulatory Visit: Payer: BC Managed Care – PPO

## 2019-06-30 DIAGNOSIS — C50311 Malignant neoplasm of lower-inner quadrant of right female breast: Secondary | ICD-10-CM

## 2019-06-30 DIAGNOSIS — Z171 Estrogen receptor negative status [ER-]: Secondary | ICD-10-CM

## 2019-06-30 MED ORDER — HYDROCOD POLST-CPM POLST ER 10-8 MG/5ML PO SUER: 5.0000 mL | Freq: Two times a day (BID) | ORAL | 0 refills | Status: DC | PRN

## 2019-06-30 MED ORDER — SONAFINE EX EMUL
1.0000 "application " | Freq: Two times a day (BID) | CUTANEOUS | Status: DC
  Administered 2019-06-30: 1 via TOPICAL

## 2019-06-30 MED FILL — HYDROCODONE-CHLORPHEN ER SU: 10-8 | 12 days supply | Qty: 115 | Fill #0

## 2019-06-30 NOTE — Progress Notes (Signed)
[place] CSW Progress Note  Clinical Education officer, museum contacted patient by phone to follow-up on resources and coping. Kara Pittman is having a hard time coming to terms with the change in her diagnosis and became teary on the phone. She is trying to fight for herself and also for her son who is still living as his father was also recently diagnosed with advanced cancer and is now on hospice.  Kara Pittman is also concerned about finances as she is not sure she will be able to return to work. She has applied for disability and is waiting to hear. She asked about applying for full Medicaid and agreed to have CSW send request to [employer] to determine if they can help her apply. CSW also sent application for grant through [employer].    [name] , LCSW

## 2019-07-01 ENCOUNTER — Other Ambulatory Visit: Payer: Self-pay

## 2019-07-01 ENCOUNTER — Ambulatory Visit
Admission: RE | Admit: 2019-07-01 | Discharge: 2019-07-01 | Disposition: A | Discharge status: Home / Self Care | Payer: BC Managed Care – PPO | Source: Ambulatory Visit | Attending: Radiation Oncology | Admitting: Radiation Oncology

## 2019-07-01 ENCOUNTER — Encounter: Payer: Self-pay | Admitting: Licensed Clinical Social Worker

## 2019-07-01 ENCOUNTER — Ambulatory Visit: Payer: BC Managed Care – PPO

## 2019-07-01 DIAGNOSIS — C50311 Malignant neoplasm of lower-inner quadrant of right female breast: Secondary | ICD-10-CM | POA: Diagnosis not present

## 2019-07-01 NOTE — Progress Notes (Signed)
[place] CSW Progress Note  Holiday representative received information back regarding patient's inquiries about her Medicaid from [employer], Hotel manager. Stated that it shows full Medicaid currently and that if patient is approved for disability, DSS will transfer to disability Medicaid at that time. CSW relayed message to patient along with the contact number [phone]) for Ms. Spence.     [name] , LCSW

## 2019-07-02 ENCOUNTER — Ambulatory Visit
Admission: RE | Admit: 2019-07-02 | Discharge: 2019-07-02 | Disposition: A | Discharge status: Home / Self Care | Payer: BC Managed Care – PPO | Source: Ambulatory Visit | Attending: Radiation Oncology | Admitting: Radiation Oncology

## 2019-07-02 ENCOUNTER — Other Ambulatory Visit: Payer: Self-pay

## 2019-07-02 ENCOUNTER — Ambulatory Visit: Payer: BC Managed Care – PPO

## 2019-07-02 DIAGNOSIS — C50311 Malignant neoplasm of lower-inner quadrant of right female breast: Secondary | ICD-10-CM | POA: Diagnosis not present

## 2019-07-03 ENCOUNTER — Ambulatory Visit: Payer: BC Managed Care – PPO

## 2019-07-03 ENCOUNTER — Other Ambulatory Visit: Payer: Self-pay

## 2019-07-03 ENCOUNTER — Ambulatory Visit
Admission: RE | Admit: 2019-07-03 | Discharge: 2019-07-03 | Disposition: A | Discharge status: Home / Self Care | Payer: BC Managed Care – PPO | Source: Ambulatory Visit | Attending: Radiation Oncology | Admitting: Radiation Oncology

## 2019-07-03 ENCOUNTER — Telehealth: Payer: Self-pay

## 2019-07-03 DIAGNOSIS — C50311 Malignant neoplasm of lower-inner quadrant of right female breast: Secondary | ICD-10-CM | POA: Diagnosis not present

## 2019-07-07 ENCOUNTER — Ambulatory Visit: Payer: Medicaid Other | Admitting: Radiation Oncology

## 2019-07-07 ENCOUNTER — Ambulatory Visit
Admission: RE | Admit: 2019-07-07 | Discharge: 2019-07-07 | Disposition: A | Discharge status: Home / Self Care | Payer: Medicaid Other | Source: Ambulatory Visit | Attending: Radiation Oncology | Admitting: Radiation Oncology

## 2019-07-07 ENCOUNTER — Other Ambulatory Visit: Payer: Self-pay

## 2019-07-07 ENCOUNTER — Ambulatory Visit: Payer: Medicaid Other

## 2019-07-07 DIAGNOSIS — C50311 Malignant neoplasm of lower-inner quadrant of right female breast: Secondary | ICD-10-CM | POA: Insufficient documentation

## 2019-07-07 DIAGNOSIS — Z171 Estrogen receptor negative status [ER-]: Secondary | ICD-10-CM | POA: Diagnosis present

## 2019-07-07 DIAGNOSIS — C50811 Malignant neoplasm of overlapping sites of right female breast: Secondary | ICD-10-CM | POA: Diagnosis not present

## 2019-07-07 MED FILL — CAPECITABINE 500 MG TABLET: 500 | 21 days supply | Qty: 112 | Fill #1

## 2019-07-08 ENCOUNTER — Ambulatory Visit
Admission: RE | Admit: 2019-07-08 | Discharge: 2019-07-08 | Disposition: A | Discharge status: Home / Self Care | Payer: Medicaid Other | Source: Ambulatory Visit | Attending: Radiation Oncology | Admitting: Radiation Oncology

## 2019-07-08 ENCOUNTER — Ambulatory Visit: Payer: Medicaid Other

## 2019-07-08 ENCOUNTER — Other Ambulatory Visit: Payer: Self-pay

## 2019-07-08 DIAGNOSIS — Z5111 Encounter for antineoplastic chemotherapy: Secondary | ICD-10-CM | POA: Diagnosis not present

## 2019-07-08 DIAGNOSIS — C50811 Malignant neoplasm of overlapping sites of right female breast: Secondary | ICD-10-CM | POA: Diagnosis not present

## 2019-07-08 DIAGNOSIS — Z79899 Other long term (current) drug therapy: Secondary | ICD-10-CM | POA: Diagnosis not present

## 2019-07-08 DIAGNOSIS — C50911 Malignant neoplasm of unspecified site of right female breast: Secondary | ICD-10-CM | POA: Diagnosis not present

## 2019-07-08 DIAGNOSIS — R5383 Other fatigue: Secondary | ICD-10-CM | POA: Diagnosis not present

## 2019-07-08 DIAGNOSIS — C50311 Malignant neoplasm of lower-inner quadrant of right female breast: Secondary | ICD-10-CM | POA: Diagnosis not present

## 2019-07-08 DIAGNOSIS — Z9011 Acquired absence of right breast and nipple: Secondary | ICD-10-CM | POA: Diagnosis not present

## 2019-07-08 DIAGNOSIS — R12 Heartburn: Secondary | ICD-10-CM | POA: Diagnosis not present

## 2019-07-08 DIAGNOSIS — Z171 Estrogen receptor negative status [ER-]: Secondary | ICD-10-CM | POA: Diagnosis not present

## 2019-07-08 DIAGNOSIS — K5900 Constipation, unspecified: Secondary | ICD-10-CM | POA: Diagnosis not present

## 2019-07-08 MED FILL — FAMOTIDINE 20 MG TABLET: 20 | 30 days supply | Qty: 60 | Fill #0

## 2019-07-08 MED FILL — GABAPENTIN 300 MG CAPSULE: 300 | 30 days supply | Qty: 30 | Fill #0

## 2019-07-09 ENCOUNTER — Ambulatory Visit
Admission: RE | Admit: 2019-07-09 | Discharge: 2019-07-09 | Disposition: A | Discharge status: Home / Self Care | Payer: Medicaid Other | Source: Ambulatory Visit | Attending: Radiation Oncology | Admitting: Radiation Oncology

## 2019-07-09 ENCOUNTER — Other Ambulatory Visit: Payer: Self-pay

## 2019-07-09 ENCOUNTER — Ambulatory Visit: Payer: Medicaid Other

## 2019-07-09 DIAGNOSIS — C50811 Malignant neoplasm of overlapping sites of right female breast: Secondary | ICD-10-CM | POA: Diagnosis not present

## 2019-07-09 DIAGNOSIS — C50311 Malignant neoplasm of lower-inner quadrant of right female breast: Secondary | ICD-10-CM | POA: Diagnosis not present

## 2019-07-09 NOTE — Telephone Encounter (Signed)
07/03/2019 11:05 AM Phone (Incoming) Pittman, Kara (Self) [phone] (H)   Pt. calling to verify appt. on 6/2. Advised yes at 4 pm.    By De Burrs, RN

## 2019-07-10 ENCOUNTER — Ambulatory Visit: Payer: Medicaid Other

## 2019-07-10 ENCOUNTER — Other Ambulatory Visit: Payer: Self-pay

## 2019-07-10 ENCOUNTER — Ambulatory Visit
Admission: RE | Admit: 2019-07-10 | Discharge: 2019-07-10 | Disposition: A | Discharge status: Home / Self Care | Payer: Medicaid Other | Source: Ambulatory Visit | Attending: Radiation Oncology | Admitting: Radiation Oncology

## 2019-07-10 DIAGNOSIS — C50311 Malignant neoplasm of lower-inner quadrant of right female breast: Secondary | ICD-10-CM

## 2019-07-10 DIAGNOSIS — Z171 Estrogen receptor negative status [ER-]: Secondary | ICD-10-CM

## 2019-07-10 DIAGNOSIS — C50811 Malignant neoplasm of overlapping sites of right female breast: Secondary | ICD-10-CM | POA: Diagnosis not present

## 2019-07-10 MED ORDER — SONAFINE EX EMUL
1.0000 "application " | Freq: Two times a day (BID) | CUTANEOUS | Status: DC
  Administered 2019-07-10: 1 via TOPICAL

## 2019-07-13 ENCOUNTER — Ambulatory Visit
Admission: RE | Admit: 2019-07-13 | Discharge: 2019-07-13 | Disposition: A | Discharge status: Home / Self Care | Payer: Medicaid Other | Source: Ambulatory Visit | Attending: Radiation Oncology | Admitting: Radiation Oncology

## 2019-07-13 ENCOUNTER — Ambulatory Visit: Payer: Medicaid Other

## 2019-07-13 ENCOUNTER — Inpatient Hospital Stay: Payer: Medicaid Other | Attending: Hematology and Oncology | Admitting: Hematology and Oncology

## 2019-07-13 ENCOUNTER — Other Ambulatory Visit: Payer: Self-pay

## 2019-07-13 DIAGNOSIS — R918 Other nonspecific abnormal finding of lung field: Secondary | ICD-10-CM | POA: Diagnosis not present

## 2019-07-13 DIAGNOSIS — Z9011 Acquired absence of right breast and nipple: Secondary | ICD-10-CM | POA: Diagnosis not present

## 2019-07-13 DIAGNOSIS — C50911 Malignant neoplasm of unspecified site of right female breast: Secondary | ICD-10-CM | POA: Diagnosis not present

## 2019-07-13 DIAGNOSIS — Z5111 Encounter for antineoplastic chemotherapy: Secondary | ICD-10-CM | POA: Diagnosis not present

## 2019-07-13 DIAGNOSIS — C50311 Malignant neoplasm of lower-inner quadrant of right female breast: Secondary | ICD-10-CM | POA: Diagnosis not present

## 2019-07-13 DIAGNOSIS — K30 Functional dyspepsia: Secondary | ICD-10-CM | POA: Diagnosis not present

## 2019-07-13 DIAGNOSIS — C50811 Malignant neoplasm of overlapping sites of right female breast: Secondary | ICD-10-CM | POA: Diagnosis not present

## 2019-07-13 DIAGNOSIS — Z171 Estrogen receptor negative status [ER-]: Secondary | ICD-10-CM | POA: Diagnosis not present

## 2019-07-13 DIAGNOSIS — Z79899 Other long term (current) drug therapy: Secondary | ICD-10-CM | POA: Diagnosis not present

## 2019-07-13 DIAGNOSIS — R5383 Other fatigue: Secondary | ICD-10-CM | POA: Diagnosis not present

## 2019-07-13 NOTE — Assessment & Plan Note (Deleted)
12/12/2018:Patient palpated right breast mass. Mammogram showed a 3.3cm solid and cystic right breast mass, 6 o'clock position, no abnormal right axillary lymph nodes. 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Neoadjuvant chemotherapy with dose dense Adriamycin and Cytoxan x4 followed by Taxol weekly x6discontinued because of worsening breast symptomson 03/26/2019 2.Mastectomy with sentinel lymph node biopsy3/16/2021: 7 cm metaplastic carcinoma, clear margins, 4 lymph nodes negative 3.adjuvant radiation Upbeat clinical trial: No adverse effects due to participation in the study ----------------------------------------------------------------------------------------------------------------------------------------------------- Right mastectomy: 7 cm of metaplastic carcinoma grade 2, margins uninvolved, tumor directly invades the dermis 0/4lymph nodes negative. Triple negative with a Ki-67 of 15%  Sub-cutaneous recurrence: Unfortunately she is not a surgical candidate because of the amount of tissue that would need to be removed which would make it difficult to close the area. Xeloda with radiation 06/19/2019-07/31/2019  [facility] molecular testing: PD-L1 100% Second opinion at [facility]: Patient saw Dr. Wetzel Bjornstad with recommendations immunotherapy. She is contemplating on receiving immunotherapy at [facility].

## 2019-07-14 ENCOUNTER — Ambulatory Visit
Admission: RE | Admit: 2019-07-14 | Discharge: 2019-07-14 | Disposition: A | Discharge status: Home / Self Care | Payer: Medicaid Other | Source: Ambulatory Visit | Attending: Radiation Oncology | Admitting: Radiation Oncology

## 2019-07-14 ENCOUNTER — Telehealth: Payer: Self-pay | Admitting: Licensed Clinical Social Worker

## 2019-07-14 ENCOUNTER — Ambulatory Visit: Payer: Medicaid Other

## 2019-07-14 ENCOUNTER — Other Ambulatory Visit: Payer: Self-pay | Admitting: Radiation Oncology

## 2019-07-14 DIAGNOSIS — C50311 Malignant neoplasm of lower-inner quadrant of right female breast: Secondary | ICD-10-CM | POA: Diagnosis not present

## 2019-07-14 DIAGNOSIS — C50811 Malignant neoplasm of overlapping sites of right female breast: Secondary | ICD-10-CM | POA: Diagnosis not present

## 2019-07-14 MED ORDER — HYDROCOD POLST-CPM POLST ER 10-8 MG/5ML PO SUER: 5.0000 mL | Freq: Two times a day (BID) | ORAL | 0 refills | Status: DC | PRN

## 2019-07-14 NOTE — Telephone Encounter (Signed)
[facility] CLINICAL SOCIAL WORK  CSW attempted to contact patient for ongoing emotional and resource support. No answer, voicemail full so unable to leave message.   Edwinna Areola Stoisits, LCSW

## 2019-07-15 ENCOUNTER — Ambulatory Visit
Admission: RE | Admit: 2019-07-15 | Discharge: 2019-07-15 | Disposition: A | Discharge status: Home / Self Care | Payer: Medicaid Other | Source: Ambulatory Visit | Attending: Radiation Oncology | Admitting: Radiation Oncology

## 2019-07-15 ENCOUNTER — Other Ambulatory Visit: Payer: Self-pay

## 2019-07-15 ENCOUNTER — Ambulatory Visit: Payer: Medicaid Other

## 2019-07-15 DIAGNOSIS — C50811 Malignant neoplasm of overlapping sites of right female breast: Secondary | ICD-10-CM | POA: Diagnosis not present

## 2019-07-15 DIAGNOSIS — Z171 Estrogen receptor negative status [ER-]: Secondary | ICD-10-CM

## 2019-07-15 DIAGNOSIS — C50311 Malignant neoplasm of lower-inner quadrant of right female breast: Secondary | ICD-10-CM

## 2019-07-15 MED ORDER — SONAFINE EX EMUL
1.0000 "application " | Freq: Two times a day (BID) | CUTANEOUS | Status: DC
  Administered 2019-07-15: 1 via TOPICAL

## 2019-07-16 ENCOUNTER — Ambulatory Visit
Admission: RE | Admit: 2019-07-16 | Discharge: 2019-07-16 | Disposition: A | Discharge status: Home / Self Care | Payer: Medicaid Other | Source: Ambulatory Visit | Attending: Radiation Oncology | Admitting: Radiation Oncology

## 2019-07-16 ENCOUNTER — Other Ambulatory Visit: Payer: Self-pay

## 2019-07-16 DIAGNOSIS — C50311 Malignant neoplasm of lower-inner quadrant of right female breast: Secondary | ICD-10-CM | POA: Diagnosis not present

## 2019-07-16 DIAGNOSIS — C50811 Malignant neoplasm of overlapping sites of right female breast: Secondary | ICD-10-CM | POA: Diagnosis not present

## 2019-07-17 ENCOUNTER — Ambulatory Visit
Admission: RE | Admit: 2019-07-17 | Discharge: 2019-07-17 | Disposition: A | Discharge status: Home / Self Care | Payer: Medicaid Other | Source: Ambulatory Visit | Attending: Radiation Oncology | Admitting: Radiation Oncology

## 2019-07-17 ENCOUNTER — Other Ambulatory Visit: Payer: Self-pay

## 2019-07-17 DIAGNOSIS — C50811 Malignant neoplasm of overlapping sites of right female breast: Secondary | ICD-10-CM | POA: Diagnosis not present

## 2019-07-20 ENCOUNTER — Telehealth: Payer: Self-pay

## 2019-07-20 ENCOUNTER — Other Ambulatory Visit: Payer: Self-pay

## 2019-07-20 ENCOUNTER — Ambulatory Visit
Admission: RE | Admit: 2019-07-20 | Discharge: 2019-07-20 | Disposition: A | Discharge status: Home / Self Care | Payer: Medicaid Other | Source: Ambulatory Visit | Attending: Radiation Oncology | Admitting: Radiation Oncology

## 2019-07-20 ENCOUNTER — Ambulatory Visit: Payer: Medicaid Other

## 2019-07-20 ENCOUNTER — Encounter: Payer: Self-pay | Admitting: Medical Oncology

## 2019-07-20 DIAGNOSIS — C50311 Malignant neoplasm of lower-inner quadrant of right female breast: Secondary | ICD-10-CM | POA: Diagnosis not present

## 2019-07-20 DIAGNOSIS — C50811 Malignant neoplasm of overlapping sites of right female breast: Secondary | ICD-10-CM | POA: Diagnosis not present

## 2019-07-20 NOTE — Progress Notes (Signed)
  Radiation Oncology         (336) [phone] ________________________________  Name: Kara Pittman MRN: [id number]  Date: 07/21/2019  DOB: [birth year]  Simulation note The patient was brought to the treatment room for simulation for the patient's upcoming electron treatment. The patient was setup in the treatment position and the target region was delineated. The patient will receive treatment to the right chest wall  area using an en face electron field. 1 customized block/complex treatment device has been constructed for this purpose, and this will be used on a daily basis during the patient's treatment. After appropriate set up was confirmed, skin markings were placed to allow accurate targeting of the treatment area during the patient's course of therapy.  -----------------------------------  Blair Promise, PhD, MD  This document serves as a record of services personally performed by [name], MD. It was created on his behalf by Clerance Lav, a trained medical scribe. The creation of this record is based on the scribe's personal observations and the provider's statements to them. This document has been checked and approved by the attending provider.

## 2019-07-20 NOTE — Telephone Encounter (Signed)
Patient called stating she was told last week that either today or tomorrow she will need additional mapping done. She wants to know if that is today or tomorrow so she can take some extra pain medicine.

## 2019-07-20 NOTE — Progress Notes (Signed)
UPBEAT: Received study cardiac MRI from 3 month time point. Report reads "Other findings of uncertain significance" with comment stating "suspect mild reduction of LVEF to 50% to 55%." Dr. Lindi Adie reviewed result report and deemed it not clinically significant. Report will be filed in patients research chart.  [name], RN, BSN, [facility] Clinical Research 07/20/2019 1:36 PM

## 2019-07-20 NOTE — Telephone Encounter (Signed)
Patient called back by RN and advised that her extra time in Radiation for planning will be tomorrow and she will adjust her pain medication accordingly.(after talking with [facility] 2)

## 2019-07-21 ENCOUNTER — Ambulatory Visit
Admission: RE | Admit: 2019-07-21 | Discharge: 2019-07-21 | Disposition: A | Discharge status: Home / Self Care | Payer: Medicaid Other | Source: Ambulatory Visit | Attending: Radiation Oncology | Admitting: Radiation Oncology

## 2019-07-21 ENCOUNTER — Ambulatory Visit: Payer: Medicaid Other

## 2019-07-21 ENCOUNTER — Other Ambulatory Visit: Payer: Self-pay | Admitting: Radiation Oncology

## 2019-07-21 ENCOUNTER — Other Ambulatory Visit: Payer: Self-pay

## 2019-07-21 ENCOUNTER — Ambulatory Visit: Payer: Medicaid Other | Admitting: Radiation Oncology

## 2019-07-21 DIAGNOSIS — Z171 Estrogen receptor negative status [ER-]: Secondary | ICD-10-CM

## 2019-07-21 DIAGNOSIS — C50811 Malignant neoplasm of overlapping sites of right female breast: Secondary | ICD-10-CM | POA: Diagnosis not present

## 2019-07-21 DIAGNOSIS — C50311 Malignant neoplasm of lower-inner quadrant of right female breast: Secondary | ICD-10-CM | POA: Diagnosis not present

## 2019-07-21 MED ORDER — OXYCODONE HCL 5 MG PO TABS: 5.0000 mg | ORAL_TABLET | ORAL | 0 refills | Status: DC | PRN

## 2019-07-21 MED ORDER — SONAFINE EX EMUL
1.0000 "application " | Freq: Two times a day (BID) | CUTANEOUS | Status: DC
  Administered 2019-07-21: 1 via TOPICAL

## 2019-07-22 ENCOUNTER — Other Ambulatory Visit: Payer: Self-pay

## 2019-07-22 ENCOUNTER — Ambulatory Visit
Admission: RE | Admit: 2019-07-22 | Discharge: 2019-07-22 | Disposition: A | Discharge status: Home / Self Care | Payer: Medicaid Other | Source: Ambulatory Visit | Attending: Radiation Oncology | Admitting: Radiation Oncology

## 2019-07-22 ENCOUNTER — Ambulatory Visit: Payer: Medicaid Other

## 2019-07-22 DIAGNOSIS — C50311 Malignant neoplasm of lower-inner quadrant of right female breast: Secondary | ICD-10-CM

## 2019-07-22 DIAGNOSIS — C50811 Malignant neoplasm of overlapping sites of right female breast: Secondary | ICD-10-CM | POA: Diagnosis not present

## 2019-07-22 DIAGNOSIS — Z171 Estrogen receptor negative status [ER-]: Secondary | ICD-10-CM

## 2019-07-22 MED FILL — buPROPion HCL ER (XL) 150 M: 150 | 90 days supply | Qty: 90 | Fill #2

## 2019-07-22 MED FILL — SYNTHROID 75 MCG TABLET: 75 | 60 days supply | Qty: 60 | Fill #2

## 2019-07-22 MED FILL — oxyCODONE HCL 5 MG TABS: 5 | 5 days supply | Qty: 60 | Fill #0

## 2019-07-22 NOTE — Progress Notes (Signed)
  Radiation Oncology         [phone]) [phone] ________________________________  Name: Kara Pittman MRN: [id number]  Date: 07/22/2019  DOB: 02/12/1965  Simulation verification   The patient was brought to the treatment machine and placed in the plan treatment position.  Clinical set up was verified to ensure that the target region is appropriately covered for the patient's upcoming electron boost treatment.  The targeted volume of tissue is appropriately covered by the radiation field.  Based on my personal review, I approve the simulation verification.  The patient's treatment will proceed as planned.  -----------------------------------  Blair Promise, PhD, MD  This document serves as a record of services personally performed by [name], MD. It was created on his behalf by Clerance Lav, a trained medical scribe. The creation of this record is based on the scribe's personal observations and the provider's statements to them. This document has been checked and approved by the attending provider.

## 2019-07-23 ENCOUNTER — Ambulatory Visit: Payer: Medicaid Other | Admitting: Radiation Oncology

## 2019-07-23 ENCOUNTER — Ambulatory Visit: Payer: Medicaid Other

## 2019-07-23 ENCOUNTER — Ambulatory Visit
Admission: RE | Admit: 2019-07-23 | Discharge: 2019-07-23 | Disposition: A | Discharge status: Home / Self Care | Payer: Medicaid Other | Source: Ambulatory Visit | Attending: Radiation Oncology | Admitting: Radiation Oncology

## 2019-07-23 DIAGNOSIS — C50811 Malignant neoplasm of overlapping sites of right female breast: Secondary | ICD-10-CM

## 2019-07-23 DIAGNOSIS — C50311 Malignant neoplasm of lower-inner quadrant of right female breast: Secondary | ICD-10-CM | POA: Diagnosis not present

## 2019-07-23 MED ORDER — SONAFINE EX EMUL
1.0000 "application " | Freq: Two times a day (BID) | CUTANEOUS | Status: DC
  Administered 2019-07-23: 1 via TOPICAL

## 2019-07-23 MED FILL — CAPECITABINE 500 MG TABLET: 500 | 21 days supply | Qty: 112 | Fill #2

## 2019-07-23 NOTE — Progress Notes (Signed)
Sonafine 2 tubes of cream provided to patient in Radiation for Bid use at home due to pt's request.( per standing order of Dr. Sondra Come)

## 2019-07-24 ENCOUNTER — Ambulatory Visit
Admission: RE | Admit: 2019-07-24 | Discharge: 2019-07-24 | Disposition: A | Discharge status: Home / Self Care | Payer: Medicaid Other | Source: Ambulatory Visit | Attending: Radiation Oncology | Admitting: Radiation Oncology

## 2019-07-24 ENCOUNTER — Ambulatory Visit: Payer: Medicaid Other

## 2019-07-24 ENCOUNTER — Other Ambulatory Visit: Payer: Self-pay

## 2019-07-24 DIAGNOSIS — F419 Anxiety disorder, unspecified: Secondary | ICD-10-CM | POA: Diagnosis not present

## 2019-07-24 DIAGNOSIS — E039 Hypothyroidism, unspecified: Secondary | ICD-10-CM | POA: Diagnosis not present

## 2019-07-24 DIAGNOSIS — Z171 Estrogen receptor negative status [ER-]: Secondary | ICD-10-CM | POA: Diagnosis not present

## 2019-07-24 DIAGNOSIS — R519 Headache, unspecified: Secondary | ICD-10-CM | POA: Diagnosis not present

## 2019-07-24 DIAGNOSIS — C50811 Malignant neoplasm of overlapping sites of right female breast: Secondary | ICD-10-CM | POA: Diagnosis not present

## 2019-07-24 DIAGNOSIS — F329 Major depressive disorder, single episode, unspecified: Secondary | ICD-10-CM | POA: Diagnosis not present

## 2019-07-24 DIAGNOSIS — C50911 Malignant neoplasm of unspecified site of right female breast: Secondary | ICD-10-CM | POA: Diagnosis not present

## 2019-07-27 ENCOUNTER — Ambulatory Visit
Admission: RE | Admit: 2019-07-27 | Discharge: 2019-07-27 | Disposition: A | Discharge status: Home / Self Care | Payer: Medicaid Other | Source: Ambulatory Visit | Attending: Radiation Oncology | Admitting: Radiation Oncology

## 2019-07-27 ENCOUNTER — Encounter: Payer: Self-pay | Admitting: *Deleted

## 2019-07-27 ENCOUNTER — Encounter: Payer: Self-pay | Admitting: Radiation Oncology

## 2019-07-27 ENCOUNTER — Other Ambulatory Visit: Payer: Self-pay

## 2019-07-27 ENCOUNTER — Ambulatory Visit: Payer: Medicaid Other

## 2019-07-27 DIAGNOSIS — C50311 Malignant neoplasm of lower-inner quadrant of right female breast: Secondary | ICD-10-CM

## 2019-07-27 DIAGNOSIS — C50811 Malignant neoplasm of overlapping sites of right female breast: Secondary | ICD-10-CM | POA: Diagnosis not present

## 2019-07-27 DIAGNOSIS — Z171 Estrogen receptor negative status [ER-]: Secondary | ICD-10-CM

## 2019-07-27 MED ORDER — SILVER SULFADIAZINE 1 % EX CREA: TOPICAL_CREAM | Freq: Every day | CUTANEOUS | Status: DC

## 2019-07-27 MED ORDER — SONAFINE EX EMUL
1.0000 "application " | Freq: Two times a day (BID) | CUTANEOUS | Status: DC
  Administered 2019-07-27: 1 via TOPICAL

## 2019-07-28 ENCOUNTER — Ambulatory Visit: Payer: Medicaid Other | Admitting: Radiation Oncology

## 2019-07-28 ENCOUNTER — Ambulatory Visit
Admission: RE | Admit: 2019-07-28 | Discharge: 2019-07-28 | Disposition: A | Discharge status: Home / Self Care | Payer: Medicaid Other | Source: Ambulatory Visit | Attending: Radiation Oncology | Admitting: Radiation Oncology

## 2019-07-28 ENCOUNTER — Ambulatory Visit: Payer: Medicaid Other

## 2019-07-28 ENCOUNTER — Other Ambulatory Visit: Payer: Self-pay

## 2019-07-28 DIAGNOSIS — C50811 Malignant neoplasm of overlapping sites of right female breast: Secondary | ICD-10-CM | POA: Diagnosis not present

## 2019-07-29 ENCOUNTER — Ambulatory Visit
Admission: RE | Admit: 2019-07-29 | Discharge: 2019-07-29 | Disposition: A | Discharge status: Home / Self Care | Payer: Medicaid Other | Source: Ambulatory Visit | Attending: Radiation Oncology | Admitting: Radiation Oncology

## 2019-07-29 DIAGNOSIS — C50811 Malignant neoplasm of overlapping sites of right female breast: Secondary | ICD-10-CM | POA: Diagnosis not present

## 2019-07-30 ENCOUNTER — Ambulatory Visit
Admission: RE | Admit: 2019-07-30 | Discharge: 2019-07-30 | Disposition: A | Discharge status: Home / Self Care | Payer: Medicaid Other | Source: Ambulatory Visit | Attending: Radiation Oncology | Admitting: Radiation Oncology

## 2019-07-30 ENCOUNTER — Other Ambulatory Visit: Payer: Self-pay

## 2019-07-30 DIAGNOSIS — C50811 Malignant neoplasm of overlapping sites of right female breast: Secondary | ICD-10-CM | POA: Diagnosis not present

## 2019-07-30 DIAGNOSIS — C50311 Malignant neoplasm of lower-inner quadrant of right female breast: Secondary | ICD-10-CM | POA: Diagnosis not present

## 2019-07-31 ENCOUNTER — Ambulatory Visit: Payer: Medicaid Other

## 2019-07-31 ENCOUNTER — Other Ambulatory Visit: Payer: Self-pay

## 2019-07-31 ENCOUNTER — Ambulatory Visit
Admission: RE | Admit: 2019-07-31 | Discharge: 2019-07-31 | Disposition: A | Discharge status: Home / Self Care | Payer: Medicaid Other | Source: Ambulatory Visit | Attending: Radiation Oncology | Admitting: Radiation Oncology

## 2019-07-31 DIAGNOSIS — C50811 Malignant neoplasm of overlapping sites of right female breast: Secondary | ICD-10-CM | POA: Diagnosis not present

## 2019-08-02 NOTE — Progress Notes (Signed)
  Radiation Oncology         (336) [phone] ________________________________  Name: Kara Pittman MRN: [id number]  Date: 08/03/2019  DOB: 14-Feb-1965  Simulation note The patient was brought to the treatment room for simulation for the patient's upcoming electron treatment. The patient was setup in the treatment position and the target region was delineated. The patient will receive treatment to the right chest wall area using an en face electron field. Two customized block/complex treatment device has been constructed for this purpose, and this will be used on a daily basis during the patient's treatment. After appropriate set up was confirmed, skin markings were placed to allow accurate targeting of the treatment area during the patient's course of therapy.  -----------------------------------  Blair Promise, PhD, MD  This document serves as a record of services personally performed by [name], MD. It was created on his behalf by Clerance Lav, a trained medical scribe. The creation of this record is based on the scribe's personal observations and the provider's statements to them. This document has been checked and approved by the attending provider.

## 2019-08-03 ENCOUNTER — Ambulatory Visit: Payer: Medicaid Other

## 2019-08-03 ENCOUNTER — Ambulatory Visit
Admission: RE | Admit: 2019-08-03 | Discharge: 2019-08-03 | Disposition: A | Discharge status: Home / Self Care | Payer: Medicaid Other | Source: Ambulatory Visit | Attending: Radiation Oncology | Admitting: Radiation Oncology

## 2019-08-03 DIAGNOSIS — Z5111 Encounter for antineoplastic chemotherapy: Secondary | ICD-10-CM | POA: Diagnosis not present

## 2019-08-03 DIAGNOSIS — R000 Tachycardia, unspecified: Secondary | ICD-10-CM | POA: Diagnosis not present

## 2019-08-03 DIAGNOSIS — Z171 Estrogen receptor negative status [ER-]: Secondary | ICD-10-CM

## 2019-08-03 DIAGNOSIS — R509 Fever, unspecified: Secondary | ICD-10-CM | POA: Diagnosis not present

## 2019-08-03 DIAGNOSIS — C50811 Malignant neoplasm of overlapping sites of right female breast: Secondary | ICD-10-CM | POA: Diagnosis not present

## 2019-08-03 DIAGNOSIS — C50311 Malignant neoplasm of lower-inner quadrant of right female breast: Secondary | ICD-10-CM | POA: Diagnosis not present

## 2019-08-03 DIAGNOSIS — C50911 Malignant neoplasm of unspecified site of right female breast: Secondary | ICD-10-CM | POA: Diagnosis not present

## 2019-08-03 DIAGNOSIS — F418 Other specified anxiety disorders: Secondary | ICD-10-CM | POA: Diagnosis not present

## 2019-08-03 DIAGNOSIS — R110 Nausea: Secondary | ICD-10-CM | POA: Diagnosis not present

## 2019-08-03 DIAGNOSIS — R52 Pain, unspecified: Secondary | ICD-10-CM | POA: Diagnosis not present

## 2019-08-03 MED FILL — SUCRALFATE 1 GM/10ML SUSP: 1 | 30 days supply | Qty: 1200 | Fill #0

## 2019-08-03 MED FILL — LIDOCAINE-PRILOCAINE 2.5-2.: 2.5-2.5 | 30 days supply | Qty: 30 | Fill #0

## 2019-08-03 MED FILL — ONDANSETRON HCL 8 MG TABLET: 8 | 7 days supply | Qty: 20 | Fill #0

## 2019-08-03 MED FILL — oxyCODONE HCL 5 MG TABS: 5 | 7 days supply | Qty: 40 | Fill #0

## 2019-08-03 NOTE — Addendum Note (Signed)
Encounter addended by: De Burrs, RN on: 08/03/2019 6:49 PM  Actions taken: Clinical Note Signed

## 2019-08-03 NOTE — Progress Notes (Signed)
615 pm Patient came over from Radiation for a dressing to be applied to the right side of her chest, neck and under her right arm. Silvadene cream applied with cotton tip applicators over the entire area. Eight 3 x 8 inch nonadherent pads placed over the Silvadene cream. Four ABD pads [lace over the previous dressing and patient's chest wrapped with a Kerlix dressing. A non sterile mesh was placed around her chest to support the dressings. Pt. sent home and reports excruciating pain but has no meds with her. She is accompanied by her fiance. Patient seen in Radiation by Dr. Sondra Come. Encouraged patient to call with questions or concerns.  K. Gwen Pounds, RN assisted with application of the dressings.

## 2019-08-04 ENCOUNTER — Ambulatory Visit: Payer: Medicaid Other

## 2019-08-04 NOTE — Addendum Note (Signed)
Encounter addended by: Heywood Footman, RN on: 08/04/2019 3:01 PM  Actions taken: Clinical Note Signed

## 2019-08-04 NOTE — Progress Notes (Signed)
  Radiation Oncology         [phone]) [phone] ________________________________  Name: Kara Pittman MRN: [id number]  Date: 08/05/2019  DOB: 10/07/1965  Simulation verification   The patient was brought to the treatment machine and placed in the plan treatment position.  Clinical set up was verified to ensure that the target region is appropriately covered for the patient's upcoming electron boost treatment.  The targeted volume of tissue is appropriately covered by the radiation field.  Based on my personal review, I approve the simulation verification.  The patient's treatment will proceed as planned.  -----------------------------------  Blair Promise, PhD, MD  This document serves as a record of services personally performed by [name], MD. It was created on his behalf by Clerance Lav, a trained medical scribe. The creation of this record is based on the scribe's personal observations and the provider's statements to them. This document has been checked and approved by the attending provider.

## 2019-08-04 NOTE — Progress Notes (Signed)
Picture taken 07/27/2019

## 2019-08-05 ENCOUNTER — Other Ambulatory Visit: Payer: Self-pay

## 2019-08-05 ENCOUNTER — Ambulatory Visit
Admission: RE | Admit: 2019-08-05 | Discharge: 2019-08-05 | Disposition: A | Discharge status: Home / Self Care | Payer: Medicaid Other | Source: Ambulatory Visit | Attending: Radiation Oncology | Admitting: Radiation Oncology

## 2019-08-05 ENCOUNTER — Ambulatory Visit: Payer: Medicaid Other | Admitting: Radiation Oncology

## 2019-08-05 DIAGNOSIS — C50311 Malignant neoplasm of lower-inner quadrant of right female breast: Secondary | ICD-10-CM

## 2019-08-05 DIAGNOSIS — Z171 Estrogen receptor negative status [ER-]: Secondary | ICD-10-CM

## 2019-08-05 DIAGNOSIS — C50811 Malignant neoplasm of overlapping sites of right female breast: Secondary | ICD-10-CM | POA: Diagnosis not present

## 2019-08-05 MED ORDER — HYDROCOD POLST-CPM POLST ER 10-8 MG/5ML PO SUER: 5.0000 mL | Freq: Two times a day (BID) | ORAL | 0 refills | Status: DC | PRN

## 2019-08-05 MED ORDER — DOXYCYCLINE HYCLATE 100 MG PO TABS
100.0000 mg | ORAL_TABLET | Freq: Two times a day (BID) | ORAL | 0 refills | Status: DC
Start: 2019-08-05 — End: 2019-09-27

## 2019-08-05 MED FILL — DOXYCYCLINE HYCLATE 100 MG: 100 | 7 days supply | Qty: 14 | Fill #0

## 2019-08-05 MED FILL — HYDROCODONE-CHLORPHEN ER SU: 10-8 | 11 days supply | Qty: 115 | Fill #0

## 2019-08-05 NOTE — Addendum Note (Signed)
Encounter addended by: [name], MD on: 08/05/2019 2:20 PM  Actions taken: Pharmacy for encounter modified, Order list changed

## 2019-08-06 ENCOUNTER — Ambulatory Visit
Admission: RE | Admit: 2019-08-06 | Discharge: 2019-08-06 | Disposition: A | Discharge status: Home / Self Care | Payer: Medicaid Other | Source: Ambulatory Visit | Attending: Radiation Oncology | Admitting: Radiation Oncology

## 2019-08-06 ENCOUNTER — Encounter: Payer: Self-pay | Admitting: Radiation Oncology

## 2019-08-06 DIAGNOSIS — C50311 Malignant neoplasm of lower-inner quadrant of right female breast: Secondary | ICD-10-CM | POA: Diagnosis not present

## 2019-08-06 DIAGNOSIS — C50811 Malignant neoplasm of overlapping sites of right female breast: Secondary | ICD-10-CM | POA: Insufficient documentation

## 2019-08-06 DIAGNOSIS — Z171 Estrogen receptor negative status [ER-]: Secondary | ICD-10-CM | POA: Diagnosis not present

## 2019-08-07 ENCOUNTER — Other Ambulatory Visit: Payer: Self-pay | Admitting: Radiation Oncology

## 2019-08-07 ENCOUNTER — Other Ambulatory Visit: Payer: Self-pay

## 2019-08-07 ENCOUNTER — Telehealth: Payer: Self-pay

## 2019-08-07 ENCOUNTER — Ambulatory Visit
Admission: RE | Admit: 2019-08-07 | Discharge: 2019-08-07 | Disposition: A | Discharge status: Home / Self Care | Payer: Medicaid Other | Source: Ambulatory Visit | Attending: Radiation Oncology | Admitting: Radiation Oncology

## 2019-08-07 ENCOUNTER — Inpatient Hospital Stay: Payer: Medicaid Other | Attending: Hematology and Oncology | Admitting: Medical

## 2019-08-07 VITALS — BP 123/83 | HR 114 | Temp 98.6°F | Resp 18 | Ht 65.0 in | Wt 147.4 lb

## 2019-08-07 DIAGNOSIS — Z171 Estrogen receptor negative status [ER-]: Secondary | ICD-10-CM | POA: Diagnosis not present

## 2019-08-07 DIAGNOSIS — C50811 Malignant neoplasm of overlapping sites of right female breast: Secondary | ICD-10-CM | POA: Diagnosis not present

## 2019-08-07 DIAGNOSIS — R319 Hematuria, unspecified: Secondary | ICD-10-CM

## 2019-08-07 DIAGNOSIS — N390 Urinary tract infection, site not specified: Secondary | ICD-10-CM

## 2019-08-07 DIAGNOSIS — C50311 Malignant neoplasm of lower-inner quadrant of right female breast: Secondary | ICD-10-CM

## 2019-08-07 DIAGNOSIS — Z452 Encounter for adjustment and management of vascular access device: Secondary | ICD-10-CM | POA: Insufficient documentation

## 2019-08-07 DIAGNOSIS — R300 Dysuria: Secondary | ICD-10-CM

## 2019-08-07 LAB — URINALYSIS, COMPLETE (UACMP) WITH MICROSCOPIC
Bilirubin Urine: NEGATIVE
Glucose, UA: NEGATIVE mg/dL
Ketones, ur: NEGATIVE mg/dL
Nitrite: NEGATIVE
Protein, ur: NEGATIVE mg/dL
Specific Gravity, Urine: 1.021 (ref 1.005–1.030)
WBC, UA: >50 WBC/hpf — ABNORMAL HIGH (ref 0–5)
pH: 5 (ref 5.0–8.0)

## 2019-08-07 MED ORDER — PHENAZOPYRIDINE HCL 200 MG PO TABS: 200.0000 mg | ORAL_TABLET | Freq: Three times a day (TID) | ORAL | 0 refills | Status: DC | PRN

## 2019-08-07 MED ORDER — SULFAMETHOXAZOLE-TRIMETHOPRIM 800-160 MG PO TABS: 1.0000 | ORAL_TABLET | Freq: Two times a day (BID) | ORAL | 0 refills | Status: DC

## 2019-08-07 MED FILL — SULFAMETHOXAZOLE-TMP DS TAB: 800-160 | 7 days supply | Qty: 14 | Fill #0

## 2019-08-07 MED FILL — PHENAZOPYRIDINE 200 MG TAB: 200 | 3 days supply | Qty: 10 | Fill #0

## 2019-08-07 NOTE — Telephone Encounter (Signed)
Patient called stating she has classic sx of a UTI. She was having some frequency yesterday and now has burning. She has had UTI's in the past and would like an rx to treat it. She states she has taken Sulfa type abx in the past for it. Pharmacy is [facility] outpatient [facility]. Pt' s # is [phone] Message given to Dr. Isidore Moos who is covering for Dr. Sondra Come.

## 2019-08-07 NOTE — Progress Notes (Signed)
The patient declined to wait until her urinalysis was completed before leaving.  Her results were called to her.  Her urinalysis appears that she may have a urinary tract infection.  A prescription for Bactrim and Pyridium were called to the [facility] outpatient pharmacy.  Sandi Mealy, MHS, PA-C Physician Assistant

## 2019-08-07 NOTE — Telephone Encounter (Signed)
Patient called by this RN and advised that she needs to come to the [place] and give a urine sample for a Ua and culture. She will come at 2 pm and Sandi Mealy, PA will f/u with her today on her results. Patient tel # [phone]. Patient verbalized understanding.

## 2019-08-07 NOTE — Telephone Encounter (Signed)
Pt's fiance, Ulice Dash called in requesting an rx for this patient for a mouth sore. Patient has used Magic Mouthwash in the past. His # is [phone]. He was advised that Dr. Sondra Come is off today but will ask Dr. Isidore Moos to see about this.

## 2019-08-07 NOTE — Progress Notes (Signed)
Pt declined to wait for urine sample results, will be contacted via phone regarding results/prescriptions sent to [facility].  Pt aware to call back as needed with any further questions/concerns.

## 2019-08-09 LAB — URINE CULTURE: Culture: >=100000 — AB

## 2019-08-11 ENCOUNTER — Ambulatory Visit
Admission: RE | Admit: 2019-08-11 | Discharge: 2019-08-11 | Disposition: A | Discharge status: Home / Self Care | Payer: Medicaid Other | Source: Ambulatory Visit | Attending: Radiation Oncology | Admitting: Radiation Oncology

## 2019-08-11 ENCOUNTER — Encounter: Payer: Self-pay | Admitting: Radiation Oncology

## 2019-08-11 ENCOUNTER — Other Ambulatory Visit: Payer: Self-pay

## 2019-08-11 ENCOUNTER — Encounter: Payer: Self-pay | Admitting: *Deleted

## 2019-08-11 VITALS — BP 92/70 | HR 94 | Temp 98.2°F | Resp 20 | Ht 65.0 in | Wt 149.0 lb

## 2019-08-11 DIAGNOSIS — C50311 Malignant neoplasm of lower-inner quadrant of right female breast: Secondary | ICD-10-CM | POA: Diagnosis not present

## 2019-08-11 DIAGNOSIS — R350 Frequency of micturition: Secondary | ICD-10-CM | POA: Diagnosis not present

## 2019-08-11 DIAGNOSIS — R234 Changes in skin texture: Secondary | ICD-10-CM | POA: Diagnosis not present

## 2019-08-11 DIAGNOSIS — R5383 Other fatigue: Secondary | ICD-10-CM | POA: Diagnosis not present

## 2019-08-11 DIAGNOSIS — R309 Painful micturition, unspecified: Secondary | ICD-10-CM | POA: Insufficient documentation

## 2019-08-11 DIAGNOSIS — Z171 Estrogen receptor negative status [ER-]: Secondary | ICD-10-CM

## 2019-08-11 DIAGNOSIS — Z923 Personal history of irradiation: Secondary | ICD-10-CM | POA: Insufficient documentation

## 2019-08-11 DIAGNOSIS — Z79899 Other long term (current) drug therapy: Secondary | ICD-10-CM | POA: Insufficient documentation

## 2019-08-11 DIAGNOSIS — C50811 Malignant neoplasm of overlapping sites of right female breast: Secondary | ICD-10-CM

## 2019-08-11 NOTE — Progress Notes (Signed)
Radiation Oncology         [phone] ________________________________  Name: Kara Pittman MRN: [id number]  Date: 08/11/2019  DOB: November 08, 1965  Follow-Up Visit Note  CC: [name], MD  [name], MD    ICD-10-CM   1. Malignant neoplasm of overlapping sites of right female breast, unspecified estrogen receptor status ([place])  C50.811   2. 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  Narrative:  The patient returns today for close follow-up and skin check since the end of treatment. In the interval, the patient called our office four days ago complaining of a mouth sore in addition to urinary frequency and burning with urination. Regarding those complaints, she was seen by Sandi Mealy, PA-C, on that same day. Her UA showed a possible UTI for which she was prescribed Bactrim and Pyridium.  Culture from the patient's urine late last week did show E. coli infection which was sensitive to Bactrim.  Her urinary symptoms are much improved.  She never started her doxycycline and she never developed a significant fever over the weekend.  Patient was given the equivalent of Magic mouthwash for her mouth sore which is also helped this issue.  On review of  systems, she reports right chest wall  pain rated 4/10. She also reports severe fatigue and burning, peeling, and moist desquamation of the skin. She has been using Gentian Violet once a day and spot treats the areas during the evening. She denies shortness of breath.  She continues to have some coughing..  ALLERGIES:  has No Known Allergies.  Meds: Current Outpatient Medications  Medication Sig Dispense Refill  . acetaminophen (TYLENOL) 500 MG tablet Take 500-1,000 mg by mouth every 6 (six) hours as needed for mild pain or headache.    . b complex vitamins tablet Take 1 tablet by mouth at bedtime.    . bimatoprost (LATISSE) 0.03 % ophthalmic solution Place 0.03 mLs into both eyes at bedtime. Place one drop on applicator and apply evenly along the skin of the upper eyelid at base of eyelashes once daily at bedtime; repeat procedure for second eye (use a clean applicator). 5 mL 3  . buPROPion (WELLBUTRIN XL) 150 MG 24 hr tablet Take 1 tablet by mouth daily (Patient taking differently: Take 150 mg by mouth in the morning. ) 90 tablet 3  . CALCIUM PO Take 1 tablet by mouth at bedtime.    . chlorpheniramine-HYDROcodone (TUSSIONEX) 10-8 MG/5ML SUER Take 5 mLs by mouth every 12 (twelve) hours as needed for cough. 115 mL 0  . DIFFERIN 0.1 % cream APPLY TO THE AFFECTED AREA(S) TWICE DAILY 45 g 0  . docusate sodium (COLACE) 100 MG capsule Take 1 capsule (100 mg total) by mouth 2 (two) times daily. 20 capsule 0  . doxycycline (  VIBRA-TABS) 100 MG tablet Take 1 tablet (100 mg total) by mouth 2 (two) times daily. 14 tablet 0  . gabapentin (NEURONTIN) 100 MG capsule Take 1 capsule (100 mg total) by mouth 2 (two) times daily. 60 capsule 0  . lidocaine-prilocaine (EMLA) cream Apply to affected area once 30 g 3  . LORazepam (ATIVAN) 0.5 MG tablet Take 1 tablet (0.5 mg total) by mouth at bedtime as needed for sleep. 30 tablet 0  . methocarbamol (ROBAXIN) 500 MG tablet Take 1 tablet (500 mg total) by mouth every 6  (six) hours as needed for muscle spasms. 20 tablet 0  . Multiple Vitamins-Calcium (ONE-A-DAY WOMENS FORMULA) TABS Take 1 tablet by mouth at bedtime.    . ondansetron (ZOFRAN-ODT) 8 MG disintegrating tablet Take 1 tablet (8 mg total) by mouth every 8 (eight) hours as needed for nausea or vomiting. (Patient taking differently: Take 8 mg by mouth every 8 (eight) hours as needed for nausea or vomiting (DISSOLVE ORALLY). ) 20 tablet 3  . oxyCODONE (OXY IR/ROXICODONE) 5 MG immediate release tablet Take 1-2 tablets (5-10 mg total) by mouth every 4 (four) hours as needed for moderate pain. 60 tablet 0  . pantoprazole (PROTONIX) 40 MG tablet TAKE 1 TABLET BY MOUTH ONCE DAILY 30 tablet 0  . phenazopyridine (PYRIDIUM) 200 MG tablet Take 1 tablet (200 mg total) by mouth 3 (three) times daily as needed for pain. 10 tablet 0  . prochlorperazine (COMPAZINE) 10 MG tablet TAKE 1 TABLET BY MOUTH EVERY 6 HOURS AS NEEDED FOR NAUSEA & VOMITING 30 tablet 1  . promethazine (PHENERGAN) 25 MG suppository Place 1 suppository (25 mg total) rectally every 6 (six) hours as needed for nausea or vomiting. 12 each 1  . sulfamethoxazole-trimethoprim (BACTRIM DS) 800-160 MG tablet Take 1 tablet by mouth 2 (two) times daily. 14 tablet 0  . SYNTHROID 75 MCG tablet Take 1 tablet by mouth once daily (Patient taking differently: Take 75 mcg by mouth daily before breakfast. ) 90 tablet 3   No current facility-administered medications for this encounter.    Physical Findings: The patient is in no acute distress. Patient is alert and oriented.  height is 5' 5" (1.651 m) and weight is 149 lb (67.6 kg). Her temperature is 98.2 F (36.8 C). Her blood pressure is 92/70 and her pulse is 94. Her respiration is 20 and oxygen saturation is 100%.    Lungs are clear to auscultation bilaterally. Heart has regular rate and rhythm.  Right Chest area shows skin covered with gentian violet.  Gentle palpation shows some of the residual nodules to be  slightly smaller than my previous exam.  No active bleeding noted at this time.  Lab Findings: Lab Results  Component Value Date   WBC 6.7 04/22/2019   HGB 10.6 (L) 04/22/2019   HCT 32.3 (L) 04/22/2019   MCV 100.3 (H) 04/22/2019   PLT 331 04/22/2019    Radiographic Findings: No results found.  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  Minimal bleeding from this area over the weekend. she will continue using gentian violet.  Additional bottles given to her today.  Plan: The patient will return in one week for skin re-check.  She will return sooner than this appointment if needed.  She will be seen by Dr. Wetzel Bjornstad down at [facility]    ____________________________________   Nelda Severe.  Sondra Come, PhD, MD  This document serves as a record of services personally performed by [name], MD. It was created on his behalf by Clerance Lav, a trained medical scribe. The creation of this record is based on the scribe's personal observations and the provider's statements to them. This document has been checked and approved by the attending provider.

## 2019-08-11 NOTE — Progress Notes (Signed)
These results were released to the patient via MyChart

## 2019-08-11 NOTE — Progress Notes (Incomplete)
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 Of note, she was also receiving radiosensitizing chemotherapy with Xeloda at [facility]. She was also undergiong weekly blood work and clinical assessments at [facility] under the care of Dr. Wetzel Bjornstad. During the first week of radiation therapy, the patient was having significant difficulty placing her arm in the treatment postion secondary to pain. She was also having difficult sleeping at night due to secondary disease along the right chest wall and axillary region. She had been taking Advil, which she was advised to discontinue in light of her chemotherapy. She did have some Oxycodone left over from her surgery. I recommended that she take that and prescription was refilled. On 06/23/2019, the tumor nodules along the right chest wall and axillary region were noted to have progressed significantly since her initial  examination a week prior. All of the lesions did still appear to be in the radiation treatment area. The patient was seen by Dr. Wetzel Bjornstad on 06/29/2019, during which time it was felt that she had been making some progress with slight shrinkage and no new nodules. She did develop a upper respiratory illness a few days prior, which was felt to likely be viral in nature. The increased couging secondary to the URI had caused more fatigue. She was given a prescription for Tussionex.  As treatment continued, she continued to report fatigue and right-sided chest wall pain. The nodules along the central portion of the chest wall continued to shrink, although she did develop some erythema in the treatment area and radiation dermatitis in the upper inner aspect of the chest wall area and along the parasternal area. However, there was no moist desquamation and no obvious new palpable or visible nodules. On 07/14/2019, the patient had appeared to be having a response to the larger lesions along the region of the mastectomy scar. There was no obvious extension outside of the treatment fields at that time. On 07/21/2019, the tumorous nodules were noted to be beginning to regress nicely. There was some diffuse erythema without moist desquamation along the right chest wall. On that same day, the patient had been through set-up of her custom low electron field encompassing the disease at that time along the right chest wall and right supraclavicular region, which was encompassed in one large electron field.  After beginning boost treatment, the patient began to report increased pain in her chest wall. On 07/27/2019, the patient was noted to begin developing some moist desquamation in  the right axilla and sternal area with bright erythema and palpable masses over the chest wall/SCV. She was instructed to use Silvadene BID. On 08/05/2019, Gentian Violet 1% was painted to all of her right chest. She was given six bottles and was  instructed to use daily.   Plan: The patient will follow-up with radiation oncology in five days.  ________________________________________________   Blair Promise, PhD, MD  This document serves as a record of services personally performed by [name], MD. It was created on his behalf by Clerance Lav, a trained medical scribe. The creation of this record is based on the scribe's personal observations and the provider's statements to them. This document has been checked and approved by the attending provider.

## 2019-08-11 NOTE — Progress Notes (Signed)
Patient states that she has breast pain that is a 4 out of 10 on the pain scale. Patient states that she has severe fatigue. Patient skin has burning, peeling and moist desquamation. Patient is using Gentian violet 1 time per day and spot treats the areas during the evening.   BP 92/70 (BP Location: Left Arm, Patient Position: Sitting, Cuff Size: Normal)   Pulse 94   Temp 98.2 F (36.8 C)   Resp 20   Ht 5\' 5"  (1.651 m)   Wt 149 lb (67.6 kg)   SpO2 100%   BMI 24.79 kg/m

## 2019-08-12 ENCOUNTER — Telehealth: Payer: Self-pay | Admitting: *Deleted

## 2019-08-12 DIAGNOSIS — F329 Major depressive disorder, single episode, unspecified: Secondary | ICD-10-CM | POA: Diagnosis not present

## 2019-08-12 DIAGNOSIS — Z5111 Encounter for antineoplastic chemotherapy: Secondary | ICD-10-CM | POA: Diagnosis not present

## 2019-08-12 DIAGNOSIS — M25532 Pain in left wrist: Secondary | ICD-10-CM | POA: Diagnosis not present

## 2019-08-12 DIAGNOSIS — Z79899 Other long term (current) drug therapy: Secondary | ICD-10-CM | POA: Diagnosis not present

## 2019-08-12 DIAGNOSIS — S21109S Unspecified open wound of unspecified front wall of thorax without penetration into thoracic cavity, sequela: Secondary | ICD-10-CM | POA: Diagnosis not present

## 2019-08-12 DIAGNOSIS — F419 Anxiety disorder, unspecified: Secondary | ICD-10-CM | POA: Diagnosis not present

## 2019-08-12 DIAGNOSIS — M25531 Pain in right wrist: Secondary | ICD-10-CM | POA: Diagnosis not present

## 2019-08-12 DIAGNOSIS — N390 Urinary tract infection, site not specified: Secondary | ICD-10-CM | POA: Diagnosis not present

## 2019-08-12 DIAGNOSIS — Z171 Estrogen receptor negative status [ER-]: Secondary | ICD-10-CM | POA: Diagnosis not present

## 2019-08-12 DIAGNOSIS — M79676 Pain in unspecified toe(s): Secondary | ICD-10-CM | POA: Diagnosis not present

## 2019-08-12 DIAGNOSIS — C50911 Malignant neoplasm of unspecified site of right female breast: Secondary | ICD-10-CM | POA: Diagnosis not present

## 2019-08-12 DIAGNOSIS — R238 Other skin changes: Secondary | ICD-10-CM | POA: Diagnosis not present

## 2019-08-12 DIAGNOSIS — C50811 Malignant neoplasm of overlapping sites of right female breast: Secondary | ICD-10-CM | POA: Diagnosis not present

## 2019-08-12 DIAGNOSIS — Z923 Personal history of irradiation: Secondary | ICD-10-CM | POA: Diagnosis not present

## 2019-08-12 MED FILL — oxyCODONE HCL 5 MG TABS: 5 | 5 days supply | Qty: 60 | Fill #0

## 2019-08-12 NOTE — Telephone Encounter (Signed)
Called patient's sgo [name] Cox) to inform of fu appt. with Dr. Sondra Come on 08-18-19 @ 4 pm, lvm for a return call

## 2019-08-13 MED FILL — SULFAMETHOXAZOLE-TMP DS TAB: 800-160 | 14 days supply | Qty: 28 | Fill #0

## 2019-08-13 MED FILL — FLUCONAZOLE 150 MG TABS: 150 | 1 days supply | Qty: 1 | Fill #0

## 2019-08-17 DIAGNOSIS — Z923 Personal history of irradiation: Secondary | ICD-10-CM | POA: Diagnosis not present

## 2019-08-17 DIAGNOSIS — C50811 Malignant neoplasm of overlapping sites of right female breast: Secondary | ICD-10-CM | POA: Diagnosis not present

## 2019-08-17 DIAGNOSIS — Z171 Estrogen receptor negative status [ER-]: Secondary | ICD-10-CM | POA: Diagnosis not present

## 2019-08-17 DIAGNOSIS — C50911 Malignant neoplasm of unspecified site of right female breast: Secondary | ICD-10-CM | POA: Diagnosis not present

## 2019-08-17 DIAGNOSIS — Z9011 Acquired absence of right breast and nipple: Secondary | ICD-10-CM | POA: Diagnosis not present

## 2019-08-17 DIAGNOSIS — F329 Major depressive disorder, single episode, unspecified: Secondary | ICD-10-CM | POA: Diagnosis not present

## 2019-08-17 DIAGNOSIS — R222 Localized swelling, mass and lump, trunk: Secondary | ICD-10-CM | POA: Diagnosis not present

## 2019-08-17 DIAGNOSIS — M25532 Pain in left wrist: Secondary | ICD-10-CM | POA: Diagnosis not present

## 2019-08-17 DIAGNOSIS — Z79899 Other long term (current) drug therapy: Secondary | ICD-10-CM | POA: Diagnosis not present

## 2019-08-17 DIAGNOSIS — F419 Anxiety disorder, unspecified: Secondary | ICD-10-CM | POA: Diagnosis not present

## 2019-08-17 DIAGNOSIS — R110 Nausea: Secondary | ICD-10-CM | POA: Diagnosis not present

## 2019-08-17 DIAGNOSIS — R918 Other nonspecific abnormal finding of lung field: Secondary | ICD-10-CM | POA: Diagnosis not present

## 2019-08-17 NOTE — Progress Notes (Signed)
Radiation Oncology         [phone] ________________________________  Name: Kara Pittman MRN: [id number]  Date: 08/18/2019  DOB: 03/10/1965  Follow-Up Visit Note  CC: Hensel, William A, MD  Gudena, Vinay, MD    ICD-10-CM   1. Malignant neoplasm of overlapping sites of right female breast, unspecified estrogen receptor status ([facility])  C50.811   2. 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  Radiation Treatment Dates: 06/18/2019 through 08/06/2019 Site Technique Total Dose (Gy) Dose per Fx (Gy) Completed Fx Beam Energies  Chest Wall, Right: CW_Rt_Bst Electron 12/12 2 6/6 9E  Chest Wall, Right: CW_Rt_Bst_e- Electron 4/4 2 2/2 9E  Chest Wall, Right: CW_Rt 3D 50/50 2 25/25 6X  Chest Wall, Right: CW_Rt_SCV 3D 50/50 2 25/25 6X   Cumulative dose to the largest nodules along the right chest wall of 66 Gy in 33 fractions along with radiosensitizing chemotherapy.  Narrative: The patient returns today for close follow-up and skin check. During her last visit, the right chest area was covered with gentian violet. Gentle palpation showed that some of the residual nodules had slightly decreased in size. Today, patient continues to have gentian violet along the right chest wall area she reports some decrease in the drainage.  Has noticed a new nodule along the right flank.  My of this findings PET scan has been ordered as below Dr. Anders recommended she continue on antibiotics to her skin is healed at the discretion of radiation oncology.  She has been on Bactrim DS and will switch to doxycycline to better coverage along the chest wall area.  Of note, she was seen by Dr. Anders at [facility] on 08/12/2019. She is  scheduled for a PET scan on 08/24/2019 and will follow-up again on 08/31/2019.  On review of systems, she reports continued pain along the right chest wall area. She denies chills or fever.  Her mother is taking care of her right chest area.  Her mother is a retired nurse.  Patient plans on increasing her fluid intake in light of her relatively low blood pressure.   ALLERGIES:  has No Known Allergies.  Meds: Current Outpatient Medications  Medication Sig Dispense Refill  . acetaminophen (TYLENOL) 500 MG tablet Take 500-1,000 mg by mouth every 6 (six) hours as needed for mild pain or headache.    . b complex vitamins tablet Take 1 tablet by mouth at bedtime.    . bimatoprost (LATISSE) 0.03 % ophthalmic solution Place 0.03 mLs into both eyes at bedtime. Place one drop on applicator and apply evenly along the skin of the upper eyelid at base of eyelashes once daily at bedtime; repeat procedure for second eye (use a clean applicator). 5 mL 3  . buPROPion (WELLBUTRIN XL) 150 MG 24 hr tablet Take 1 tablet by mouth daily (Patient taking differently: Take 150 mg by mouth in the morning. ) 90 tablet 3  . CALCIUM PO Take 1 tablet by mouth at bedtime.    . chlorpheniramine-HYDROcodone (TUSSIONEX) 10-8 MG/5ML SUER Take 5 mLs by mouth every 12 (twelve) hours as needed for cough. 115 mL 0  . DIFFERIN 0.1 % cream APPLY TO THE AFFECTED AREA(S) TWICE DAILY 45 g 0  . docusate sodium (COLACE) 100 MG capsule Take   1 capsule (100 mg total) by mouth 2 (two) times daily. 20 capsule 0  . doxycycline (VIBRA-TABS) 100 MG tablet Take 1 tablet (100 mg total) by mouth 2 (two) times daily. 14 tablet 0  . gabapentin (NEURONTIN) 300 MG capsule Take 1 capsule (300 mg total) by mouth at bedtime. 30 capsule 1  . lidocaine-prilocaine (EMLA) cream Apply to affected area once 30 g 3  . LORazepam (ATIVAN) 0.5 MG tablet Take 1 tablet (0.5 mg total) by mouth at bedtime as needed for sleep. 30 tablet 0  . methocarbamol (ROBAXIN) 500 MG  tablet Take 1 tablet (500 mg total) by mouth every 6 (six) hours as needed for muscle spasms. 20 tablet 0  . Multiple Vitamins-Calcium (ONE-A-DAY WOMENS FORMULA) TABS Take 1 tablet by mouth at bedtime.    . ondansetron (ZOFRAN-ODT) 8 MG disintegrating tablet Take 1 tablet (8 mg total) by mouth every 8 (eight) hours as needed for nausea or vomiting. (Patient taking differently: Take 8 mg by mouth every 8 (eight) hours as needed for nausea or vomiting (DISSOLVE ORALLY). ) 20 tablet 3  . oxyCODONE (OXY IR/ROXICODONE) 5 MG immediate release tablet Take 1-2 tablets (5-10 mg total) by mouth every 4 (four) hours as needed for moderate pain. 60 tablet 0  . pantoprazole (PROTONIX) 40 MG tablet TAKE 1 TABLET BY MOUTH ONCE DAILY 30 tablet 0  . phenazopyridine (PYRIDIUM) 200 MG tablet Take 1 tablet (200 mg total) by mouth 3 (three) times daily as needed for pain. 10 tablet 0  . prochlorperazine (COMPAZINE) 10 MG tablet TAKE 1 TABLET BY MOUTH EVERY 6 HOURS AS NEEDED FOR NAUSEA & VOMITING 30 tablet 1  . promethazine (PHENERGAN) 25 MG suppository Place 1 suppository (25 mg total) rectally every 6 (six) hours as needed for nausea or vomiting. 12 each 1  . sulfamethoxazole-trimethoprim (BACTRIM DS) 800-160 MG tablet Take 1 tablet by mouth 2 (two) times daily. 14 tablet 0  . SYNTHROID 75 MCG tablet Take 1 tablet by mouth once daily (Patient taking differently: Take 75 mcg by mouth daily before breakfast. ) 90 tablet 3   No current facility-administered medications for this encounter.    Physical Findings: The patient is in no acute distress. Patient is alert and oriented.  height is 5' 5" (1.651 m) and weight is 148 lb 6 oz (67.3 kg). Her oral temperature is 97.5 F (36.4 C) (abnormal). Her blood pressure is 97/72 and her pulse is 100. Her respiration is 18 and oxygen saturation is 100%.    Lungs are clear to auscultation bilaterally. Heart has regular rate and rhythm.  Right chest wall nodule showed further  shrinkage.  Patient does have a new nodule palpable in the right flank measuring approximately 2-1/2 x 2 cm  Lab Findings: Lab Results  Component Value Date   WBC 6.7 04/22/2019   HGB 10.6 (L) 04/22/2019   HCT 32.3 (L) 04/22/2019   MCV 100.3 (H) 04/22/2019   PLT 331 04/22/2019    Radiographic Findings: No results found.  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 She continues using gentian violet.  She will be on doxycycline as above.  Plan: The patient will return in one week for skin re-check. She will return sooner than this appointment if needed. She is scheduled for a PET scan on 08/24/2019 and will follow-up with Dr. Anders on 08/31/2019.    ____________________________________     Blair Promise, PhD, MD  This document serves as a record of services personally performed by [name], MD. It was created on his behalf by Clerance Lav, a trained medical scribe. The creation of this record is based on the scribe's personal observations and the provider's statements to them. This document has been checked and approved by the attending provider.

## 2019-08-18 ENCOUNTER — Ambulatory Visit
Admission: RE | Admit: 2019-08-18 | Discharge: 2019-08-18 | Disposition: A | Discharge status: Home / Self Care | Payer: Medicaid Other | Source: Ambulatory Visit | Attending: Radiation Oncology | Admitting: Radiation Oncology

## 2019-08-18 ENCOUNTER — Telehealth: Payer: Self-pay

## 2019-08-18 ENCOUNTER — Other Ambulatory Visit: Payer: Self-pay

## 2019-08-18 ENCOUNTER — Encounter: Payer: Self-pay | Admitting: Radiation Oncology

## 2019-08-18 VITALS — BP 97/72 | HR 100 | Temp 97.5°F | Resp 18 | Ht 65.0 in | Wt 148.4 lb

## 2019-08-18 DIAGNOSIS — C50811 Malignant neoplasm of overlapping sites of right female breast: Secondary | ICD-10-CM

## 2019-08-18 DIAGNOSIS — Z923 Personal history of irradiation: Secondary | ICD-10-CM | POA: Insufficient documentation

## 2019-08-18 DIAGNOSIS — C50311 Malignant neoplasm of lower-inner quadrant of right female breast: Secondary | ICD-10-CM | POA: Diagnosis not present

## 2019-08-18 DIAGNOSIS — Z79899 Other long term (current) drug therapy: Secondary | ICD-10-CM | POA: Diagnosis not present

## 2019-08-18 DIAGNOSIS — Z171 Estrogen receptor negative status [ER-]: Secondary | ICD-10-CM | POA: Insufficient documentation

## 2019-08-18 MED ORDER — GABAPENTIN 300 MG PO CAPS: 300.0000 mg | ORAL_CAPSULE | Freq: Every day | ORAL | 1 refills | Status: DC

## 2019-08-18 MED FILL — GABAPENTIN 300 MG CAPSULE: 300 | 30 days supply | Qty: 30 | Fill #0

## 2019-08-18 NOTE — Progress Notes (Signed)
Patient presents today for a follow-up for breast cancer. Patient states that her pain in a 4 out of 10 on the pain scale in her breast. Patient states moderate fatigue. Patient skin has peeling, redness, oozing and open. Patient is administering Gentian violet daily as directed.   BP 97/72 (BP Location: Left Arm, Patient Position: Sitting)   Pulse 100   Temp (!) 97.5 F (36.4 C) (Oral)   Resp 18   Ht 5\' 5"  (1.651 m)   Wt 148 lb 6 oz (67.3 kg)   SpO2 100%   BMI 24.69 kg/m

## 2019-08-18 NOTE — Telephone Encounter (Signed)
Spoke with patient to verify Gabapentin dose per Dr. Sondra Come. Patient states that she is taking 300mg  1 tablet at bedtime. TM

## 2019-08-20 ENCOUNTER — Ambulatory Visit
Admission: RE | Admit: 2019-08-20 | Discharge: 2019-08-20 | Disposition: A | Discharge status: Home / Self Care | Payer: Medicaid Other | Source: Ambulatory Visit | Attending: Radiation Oncology | Admitting: Radiation Oncology

## 2019-08-20 ENCOUNTER — Other Ambulatory Visit: Payer: Self-pay

## 2019-08-20 ENCOUNTER — Encounter: Payer: Self-pay | Admitting: Radiation Oncology

## 2019-08-20 ENCOUNTER — Other Ambulatory Visit: Payer: Self-pay | Admitting: Radiation Oncology

## 2019-08-20 ENCOUNTER — Telehealth: Payer: Self-pay

## 2019-08-20 ENCOUNTER — Ambulatory Visit (HOSPITAL_COMMUNITY)
Admission: RE | Admit: 2019-08-20 | Discharge: 2019-08-20 | Disposition: A | Discharge status: Home / Self Care | Payer: Medicaid Other | Source: Ambulatory Visit | Attending: Radiation Oncology | Admitting: Radiation Oncology

## 2019-08-20 ENCOUNTER — Inpatient Hospital Stay: Payer: Medicaid Other

## 2019-08-20 VITALS — BP 107/81 | HR 104 | Temp 99.0°F | Resp 18 | Ht 65.0 in | Wt 147.4 lb

## 2019-08-20 DIAGNOSIS — R911 Solitary pulmonary nodule: Secondary | ICD-10-CM | POA: Diagnosis not present

## 2019-08-20 DIAGNOSIS — Z452 Encounter for adjustment and management of vascular access device: Secondary | ICD-10-CM | POA: Diagnosis not present

## 2019-08-20 DIAGNOSIS — Z171 Estrogen receptor negative status [ER-]: Secondary | ICD-10-CM

## 2019-08-20 DIAGNOSIS — Z79899 Other long term (current) drug therapy: Secondary | ICD-10-CM | POA: Insufficient documentation

## 2019-08-20 DIAGNOSIS — R509 Fever, unspecified: Secondary | ICD-10-CM | POA: Diagnosis not present

## 2019-08-20 DIAGNOSIS — C50311 Malignant neoplasm of lower-inner quadrant of right female breast: Secondary | ICD-10-CM

## 2019-08-20 DIAGNOSIS — C50811 Malignant neoplasm of overlapping sites of right female breast: Secondary | ICD-10-CM

## 2019-08-20 DIAGNOSIS — R05 Cough: Secondary | ICD-10-CM | POA: Diagnosis not present

## 2019-08-20 DIAGNOSIS — Z923 Personal history of irradiation: Secondary | ICD-10-CM | POA: Insufficient documentation

## 2019-08-20 DIAGNOSIS — R918 Other nonspecific abnormal finding of lung field: Secondary | ICD-10-CM | POA: Diagnosis not present

## 2019-08-20 DIAGNOSIS — N6310 Unspecified lump in the right breast, unspecified quadrant: Secondary | ICD-10-CM

## 2019-08-20 DIAGNOSIS — J90 Pleural effusion, not elsewhere classified: Secondary | ICD-10-CM | POA: Diagnosis not present

## 2019-08-20 DIAGNOSIS — Z95828 Presence of other vascular implants and grafts: Secondary | ICD-10-CM

## 2019-08-20 LAB — CMP (CANCER CENTER ONLY)
ALT: 28 U/L (ref 0–44)
AST: 17 U/L (ref 15–41)
Albumin: 2.5 g/dL — ABNORMAL LOW (ref 3.5–5.0)
Alkaline Phosphatase: 131 U/L — ABNORMAL HIGH (ref 38–126)
Anion gap: 12 (ref 5–15)
BUN: 6 mg/dL (ref 6–20)
CO2: 25 mmol/L (ref 22–32)
Calcium: 9.3 mg/dL (ref 8.9–10.3)
Chloride: 103 mmol/L (ref 98–111)
Creatinine: 0.69 mg/dL (ref 0.44–1.00)
GFR, Est AFR Am: >60 mL/min (ref >60)
GFR, Estimated: >60 mL/min (ref >60)
Glucose, Bld: 105 mg/dL — ABNORMAL HIGH (ref 70–99)
Potassium: 4 mmol/L (ref 3.5–5.1)
Sodium: 140 mmol/L (ref 135–145)
Total Bilirubin: 0.3 mg/dL (ref 0.3–1.2)
Total Protein: 6.9 g/dL (ref 6.5–8.1)

## 2019-08-20 LAB — CBC WITH DIFFERENTIAL (CANCER CENTER ONLY)
Abs Immature Granulocytes: 0.03 10*3/uL (ref 0.00–0.07)
Basophils Absolute: 0 10*3/uL (ref 0.0–0.1)
Basophils Relative: 0 %
Eosinophils Absolute: 0.4 10*3/uL (ref 0.0–0.5)
Eosinophils Relative: 5 %
HCT: 31.1 % — ABNORMAL LOW (ref 36.0–46.0)
Hemoglobin: 9.7 g/dL — ABNORMAL LOW (ref 12.0–15.0)
Immature Granulocytes: 0 %
Lymphocytes Relative: 4 %
Lymphs Abs: 0.3 10*3/uL — ABNORMAL LOW (ref 0.7–4.0)
MCH: 28.6 pg (ref 26.0–34.0)
MCHC: 31.2 g/dL (ref 30.0–36.0)
MCV: 91.7 fL (ref 80.0–100.0)
Monocytes Absolute: 0.7 10*3/uL (ref 0.1–1.0)
Monocytes Relative: 8 %
Neutro Abs: 6.5 10*3/uL (ref 1.7–7.7)
Neutrophils Relative %: 82 %
Platelet Count: 351 10*3/uL (ref 150–400)
RBC: 3.39 MIL/uL — ABNORMAL LOW (ref 3.87–5.11)
RDW: 17.9 % — ABNORMAL HIGH (ref 11.5–15.5)
WBC Count: 8 10*3/uL (ref 4.0–10.5)
nRBC: 0 % (ref 0.0–0.2)

## 2019-08-20 IMAGING — DX DG CHEST 2V
2 series · 2 of 2 positions shown · non-contrast
Comparison: PET scan [DATE].  Chest CT [DATE].

CLINICAL DATA: Cough and fever.  Recurrent cancer.

EXAM:
CHEST - 2 VIEW

[chest pa]
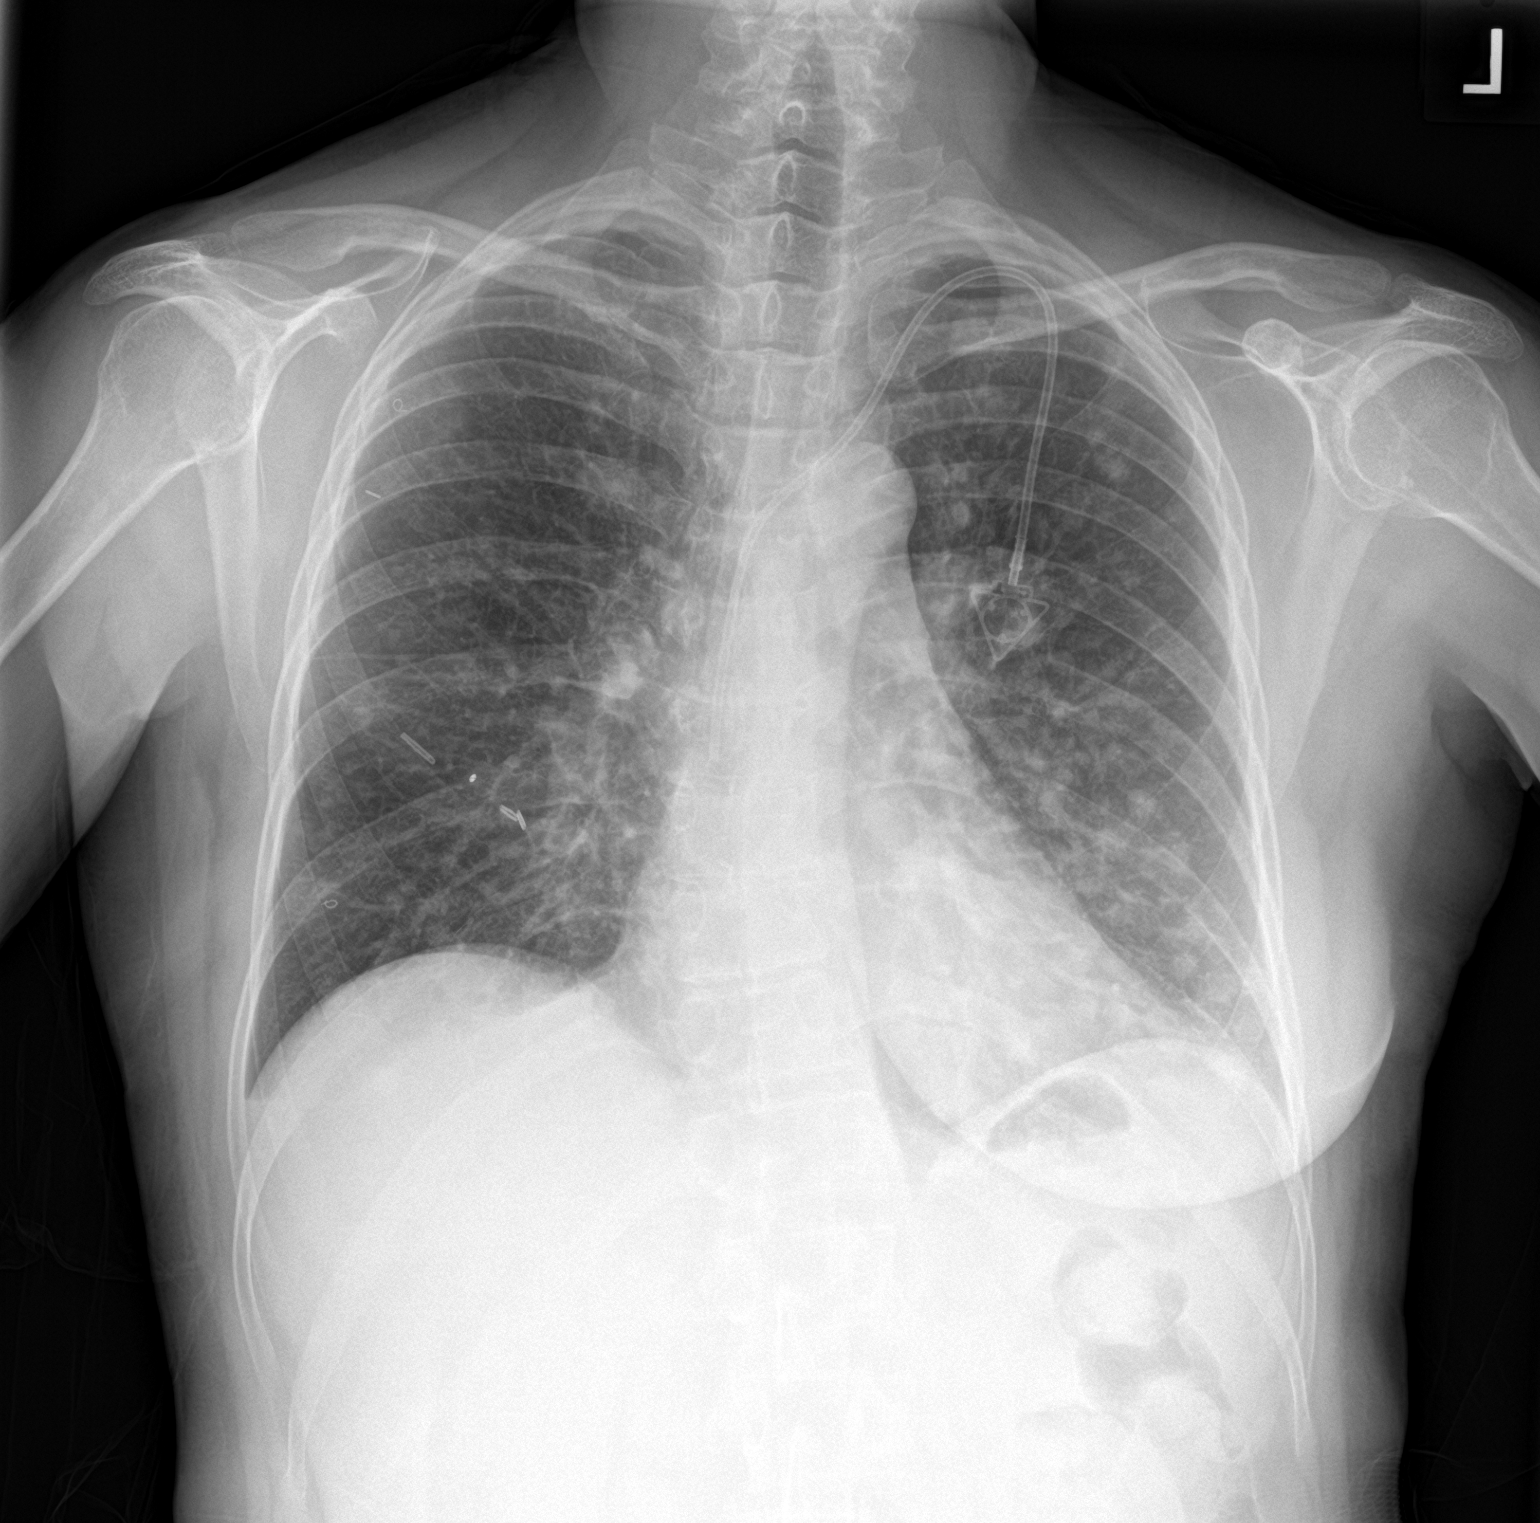

[chest lat]
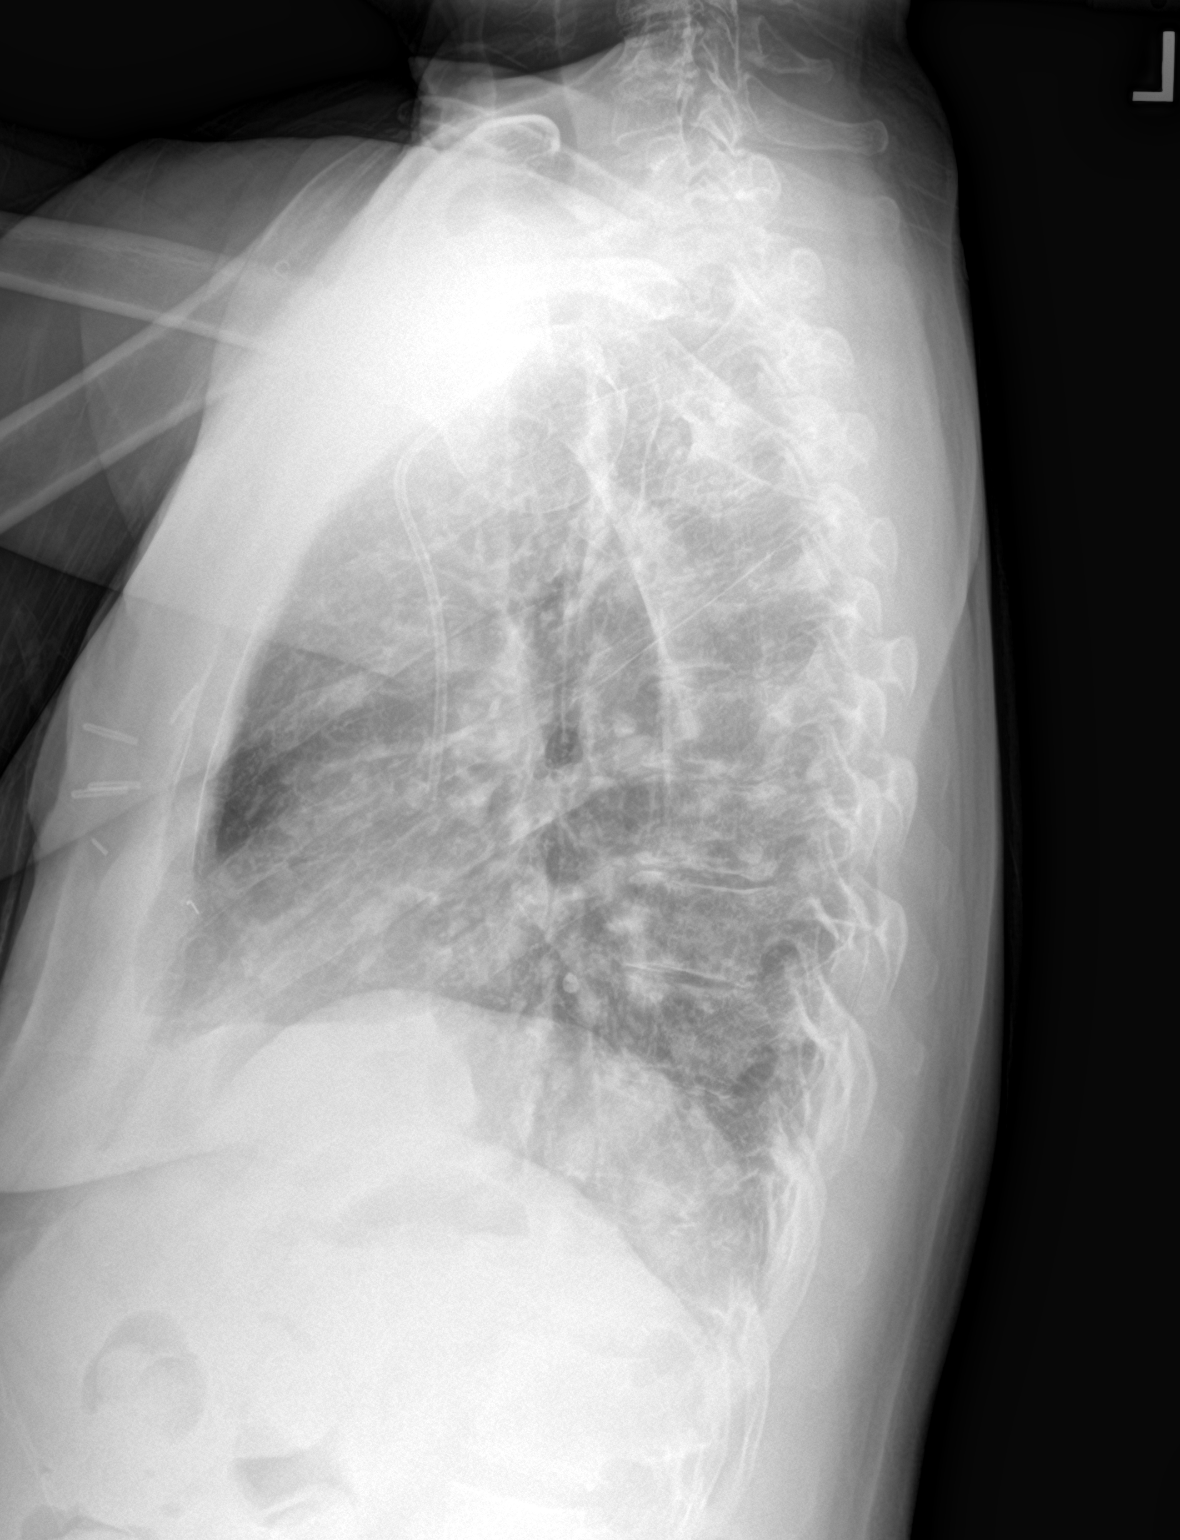

[2 of 2 positions shown; findings below may reference images not displayed]

FINDINGS: Heart size is normal. Power port has its tip in the SVC above the
right atrium. Previous right mastectomy. Multiple new and rapidly
progressive pulmonary nodules throughout both lungs, more extensive
on the left than the right. These were not visible on [DATE] by
CT and were only beginning to be visible on the PET scan [DATE].
No effusion. No bone abnormality.
IMPRESSION: Rapid and extensive development of multiple widespread pulmonary
nodules consistent with metastatic disease. The nodules are under 1
cm in size. There is no pulmonary consolidation, collapse or
effusion.

## 2019-08-20 MED ORDER — SODIUM CHLORIDE 0.9% FLUSH
10.0000 mL | Freq: Once | INTRAVENOUS | Status: AC
  Administered 2019-08-20: 10 mL
  Filled 2019-08-20: qty 10

## 2019-08-20 MED ORDER — HEPARIN SOD (PORK) LOCK FLUSH 100 UNIT/ML IV SOLN
500.0000 [IU] | Freq: Once | INTRAVENOUS | Status: AC
  Administered 2019-08-20: 500 [IU]
  Filled 2019-08-20: qty 5

## 2019-08-20 NOTE — Patient Instructions (Signed)

## 2019-08-20 NOTE — Telephone Encounter (Signed)
Patient's fiance called stating patient was very nauseated and has had a fever from 100.5 to 101.2 with body aches and needs to be seen by Dr. Sondra Come. Family was advised to come and get labs at 130 pm and come to the clinic at 145p. Mr. Tobie Poet verbalized understanding and Dr. Sondra Come aware.

## 2019-08-20 NOTE — Progress Notes (Signed)
Radiation Oncology         [phone]) [phone] ________________________________  Name: Kara Pittman MRN: [id number]  Date: 08/20/2019  DOB: 07-07-1965  Follow-Up Visit Note  CC: Andria Frames Jamal Collin, MD  Kara Resides, MD    ICD-10-CM   1. Malignant neoplasm of overlapping sites of right breast in female, estrogen receptor negative ([name])  C50.811 DG Chest 2 View   Z17.1   2. Breast mass, right  N63.10   3. 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  She has been feeling poorly over the past couple of days with fever, body aches and nausea.  She is also noticed a frequent cough which is worsened over the past few days.  Highest temperature was approximately 101.2 she denies any hemoptysis or difficulties with breathing                             ALLERGIES:  has No Known Allergies.  Meds: Current Outpatient Medications  Medication Sig Dispense Refill  . acetaminophen (TYLENOL) 500 MG tablet Take 500-1,000 mg by mouth every 6 (six) hours as needed for mild pain or headache.    . b complex vitamins tablet Take 1 tablet by mouth at bedtime.    . bimatoprost (LATISSE) 0.03 % ophthalmic solution Place 0.03 mLs into both eyes at bedtime. Place one drop on applicator and apply evenly along the skin of the upper eyelid at base of eyelashes once daily at bedtime; repeat procedure for second eye (use a clean applicator). 5 mL 3  . buPROPion (WELLBUTRIN XL) 150 MG 24 hr tablet Take 1 tablet by mouth daily (Patient taking differently: Take 150 mg by mouth in the morning. ) 90 tablet 3  . CALCIUM PO Take 1 tablet by mouth at bedtime.    .  chlorpheniramine-HYDROcodone (TUSSIONEX) 10-8 MG/5ML SUER Take 5 mLs by mouth every 12 (twelve) hours as needed for cough. 115 mL 0  . DIFFERIN 0.1 % cream APPLY TO THE AFFECTED AREA(S) TWICE DAILY 45 g 0  . docusate sodium (COLACE) 100 MG capsule Take 1 capsule (100 mg total) by mouth 2 (two) times daily. 20 capsule 0  . doxycycline (VIBRA-TABS) 100 MG tablet Take 1 tablet (100 mg total) by mouth 2 (two) times daily. 14 tablet 0  . gabapentin (NEURONTIN) 300 MG capsule Take 1 capsule (300 mg total) by mouth at bedtime. 30 capsule 1  . lidocaine-prilocaine (EMLA) cream Apply to affected area once 30 g 3  . LORazepam (ATIVAN) 0.5 MG tablet Take 1 tablet (0.5 mg total) by mouth at bedtime as needed for sleep. 30 tablet 0  . methocarbamol (ROBAXIN) 500 MG tablet Take 1 tablet (500 mg total) by mouth every 6 (six) hours as needed for muscle spasms. 20 tablet 0  . Multiple Vitamins-Calcium (ONE-A-DAY WOMENS FORMULA) TABS Take 1 tablet by mouth at bedtime.    . ondansetron (ZOFRAN-ODT) 8 MG disintegrating tablet Take 1 tablet (8 mg total) by mouth every 8 (eight) hours as needed for nausea or vomiting. (Patient taking differently: Take 8 mg by mouth every 8 (eight) hours as needed for nausea or vomiting (DISSOLVE ORALLY). )  20 tablet 3  . oxyCODONE (OXY IR/ROXICODONE) 5 MG immediate release tablet Take 1-2 tablets (5-10 mg total) by mouth every 4 (four) hours as needed for moderate pain. 60 tablet 0  . pantoprazole (PROTONIX) 40 MG tablet TAKE 1 TABLET BY MOUTH ONCE DAILY 30 tablet 0  . phenazopyridine (PYRIDIUM) 200 MG tablet Take 1 tablet (200 mg total) by mouth 3 (three) times daily as needed for pain. 10 tablet 0  . prochlorperazine (COMPAZINE) 10 MG tablet TAKE 1 TABLET BY MOUTH EVERY 6 HOURS AS NEEDED FOR NAUSEA & VOMITING 30 tablet 1  . promethazine (PHENERGAN) 25 MG suppository Place 1 suppository (25 mg total) rectally every 6 (six) hours as needed for nausea or vomiting. 12 each 1  .  sulfamethoxazole-trimethoprim (BACTRIM DS) 800-160 MG tablet Take 1 tablet by mouth 2 (two) times daily. 14 tablet 0  . SYNTHROID 75 MCG tablet Take 1 tablet by mouth once daily (Patient taking differently: Take 75 mcg by mouth daily before breakfast. ) 90 tablet 3   No current facility-administered medications for this encounter.    Physical Findings: The patient is in no acute distress. Patient is alert and oriented.  Appears fatigued . height is 5' 5"  (1.651 m) and weight is 147 lb 6 oz (66.8 kg). Her oral temperature is 99 F (37.2 C). Her blood pressure is 107/81 and her pulse is 104 (abnormal). Her respiration is 18 and oxygen saturation is 99%. .  Lungs are clear to auscultation bilaterally. Heart has regular rate and rhythm.  Abdomen soft, non-tender, normal bowel sounds.  Detailed neurological exam reveals no focal deficits.  The oral cavity is moist without secondary infection.  Right chest wall not examined today as was examined earlier this week.  Lab Findings: Lab Results  Component Value Date   WBC 8.0 08/20/2019   HGB 9.7 (L) 08/20/2019   HCT 31.1 (L) 08/20/2019   MCV 91.7 08/20/2019   PLT 351 08/20/2019    Radiographic Findings: DG Chest 2 View  Result Date: 08/20/2019 CLINICAL DATA:  Cough and fever.  Recurrent cancer. EXAM: CHEST - 2 VIEW COMPARISON:  PET scan 06/11/2019.  Chest CT 05/13/2019. FINDINGS: Heart size is normal. Power port has its tip in the SVC above the right atrium. Previous right mastectomy. Multiple new and rapidly progressive pulmonary nodules throughout both lungs, more extensive on the left than the right. These were not visible on 05/13/2019 by CT and were only beginning to be visible on the PET scan 06/11/2019. No effusion. No bone abnormality. IMPRESSION: Rapid and extensive development of multiple widespread pulmonary nodules consistent with metastatic disease. The nodules are under 1 cm in size. There is no pulmonary consolidation, collapse or  effusion. Electronically Signed   By: Nelson Chimes M.D.   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  She was switched from Bactrim to doxycycline earlier this week to provide better coverage on the right chest wall area.  Chest x-ray unfortunately as above shows rapid and extensive development of widespread pulmonary nodules consistent with metastatic disease.  I reviewed the patient's chest x-ray images with her as well as her mother. She was obviously disappointed with the findings.  I forwarded these findings to her medical oncologist Dr. Wetzel Bjornstad at [facility].  Plan: Patient will return on Tuesday, July 20 for skin check.  Systemic therapy/immunotherapy on hold  until further healing along the right chest wall area.  ____________________________________ [name], MD

## 2019-08-20 NOTE — Progress Notes (Signed)
Patient here for a visit due to c/o's of fever, body aches and nausea. Patient reports frequent cough that has worsened in the last few days. States has a wet cough and unable to cough anything up. Patient reports pain in her tailbone, right wrist and chest. She report she is really having aches all over.  BP 107/81 (BP Location: Left Arm, Patient Position: Sitting)   Pulse (!) 104   Temp 99 F (37.2 C) (Oral)   Resp 18   Ht 5\' 5"  (1.651 m)   Wt 147 lb 6 oz (66.8 kg)   SpO2 99%   BMI 24.52 kg/m   Wt Readings from Last 3 Encounters:  08/20/19 147 lb 6 oz (66.8 kg)  08/18/19 148 lb 6 oz (67.3 kg)  08/11/19 149 lb (67.6 kg)

## 2019-08-21 ENCOUNTER — Telehealth: Payer: Self-pay

## 2019-08-21 NOTE — Telephone Encounter (Signed)
Patient called and ML on VM that her next appointment with Dr. Sondra Come is at 4 pm on 08/25/2019. Message also left per Dr. Sondra Come that she needs to increase her protein intake as her Albumin level was low to assist with the healing process. ( eggs, milkshakes)

## 2019-08-21 NOTE — Telephone Encounter (Signed)
TC to patient and similar message left as this morning. Encouraged to call the on call MD if she has  problems this weekend.

## 2019-08-24 ENCOUNTER — Telehealth: Payer: Self-pay

## 2019-08-24 NOTE — Progress Notes (Signed)
Radiation Oncology         [phone] ________________________________  Name: Kara Pittman MRN: [id number]  Date: 08/25/2019  DOB: [birth year]  Follow-Up Visit Note  CC: Andria Frames Jamal Collin, MD  [name], MD    ICD-10-CM   1. Malignant neoplasm of lower-inner quadrant of right breast of female, estrogen receptor negative ([place])  C50.311    Z17.1   2. 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  Radiation Treatment Dates: 06/18/2019 through 08/06/2019 Site Technique Total Dose (Gy) Dose per Fx (Gy) Completed Fx Beam Energies  Chest Wall, Right: CW_Rt_Bst Electron 12/12 2 6/6 9E  Chest Wall, Right: CW_Rt_Bst_e- Electron 4/4 2 2/2 9E  Chest Wall, Right: CW_Rt 3D 50/50 2 25/25 6X  Chest Wall, Right: CW_Rt_SCV 3D 50/50 2 25/25 6X   Cumulative dose to the largest nodules along the right chest wall of 66 Gy in 33 fractions along with radiosensitizing chemotherapy.  Narrative:  The patient returns today for close follow-up and skin recheck. She was last seen on 08/20/2019 for an unscheduled follow-up, during which time there was concern for possible pneumonia given her presentation of fever and cough. Chest x-ray on that day showed rapid and extensive development of the widespread pulmonary nodules, consistent with metastatic disease.  Today, she reports ongoing fevers up to 100.8.  She continues on doxycycline.  In reviewing her presentation with nausea this might be related to this antibiotic and I recommend she switch back to the Bactrim to see if this will help with her nausea issues.  She is having uncontrolled pain with her current narcotic regimen therefore we will add OxyContin twice daily.  She continues to have significant nausea with Zofran and/or Compazine.  In light of this given her  prescription for Phenergan to try.  [name] (retired Marine scientist) is helping with bathing and applying gentian violet daily.  Per report from the patient and mother the drainage is decreasing.  Very little bleeding except when bandages adhered to the treatment site.  She continues to have episodes of significant coughing.  She denies hemoptysis.  She denies any breathing difficulties.  ALLERGIES:  has No Known Allergies.    Meds: Current Outpatient Medications  Medication Sig Dispense Refill  . acetaminophen (TYLENOL) 500 MG tablet Take 500-1,000 mg by mouth every 6 (six) hours as needed for mild pain or headache.    . b complex vitamins tablet Take 1 tablet by mouth at bedtime.    . bimatoprost (LATISSE) 0.03 % ophthalmic solution Place 0.03 mLs into both eyes at bedtime. Place one drop on applicator and apply evenly along the skin of the upper eyelid at base of eyelashes once daily at bedtime; repeat procedure for second eye (use a clean applicator). 5 mL 3  . buPROPion (WELLBUTRIN XL) 150 MG 24 hr tablet Take 1 tablet by mouth daily (Patient taking differently: Take 150 mg by mouth in the morning. ) 90 tablet 3  . CALCIUM PO Take 1 tablet by mouth at bedtime.    . chlorpheniramine-HYDROcodone (TUSSIONEX) 10-8 MG/5ML SUER Take 5 mLs by mouth every 12 (twelve) hours as needed for cough. 115 mL 0  . DIFFERIN 0.1 % cream APPLY TO THE AFFECTED AREA(S) TWICE DAILY 45 g 0  . docusate sodium (COLACE) 100 MG capsule Take 1 capsule (100 mg total) by mouth 2 (two) times  daily. 20 capsule 0  . doxycycline (VIBRA-TABS) 100 MG tablet Take 1 tablet (100 mg total) by mouth 2 (two) times daily. 14 tablet 0  . gabapentin (NEURONTIN) 300 MG capsule Take 1 capsule (300 mg total) by mouth at bedtime. 30 capsule 1  . lidocaine-prilocaine (EMLA) cream Apply to affected area once 30 g 3  . LORazepam (ATIVAN) 0.5 MG tablet Take 1 tablet (0.5 mg total) by mouth at bedtime as needed for sleep. 30 tablet 0  .  methocarbamol (ROBAXIN) 500 MG tablet Take 1 tablet (500 mg total) by mouth every 6 (six) hours as needed for muscle spasms. 20 tablet 0  . Multiple Vitamins-Calcium (ONE-A-DAY WOMENS FORMULA) TABS Take 1 tablet by mouth at bedtime.    . ondansetron (ZOFRAN-ODT) 8 MG disintegrating tablet Take 1 tablet (8 mg total) by mouth every 8 (eight) hours as needed for nausea or vomiting. (Patient taking differently: Take 8 mg by mouth every 8 (eight) hours as needed for nausea or vomiting (DISSOLVE ORALLY). ) 20 tablet 3  . oxyCODONE (OXY IR/ROXICODONE) 5 MG immediate release tablet Take 1-2 tablets (5-10 mg total) by mouth every 4 (four) hours as needed for moderate pain. 60 tablet 0  . oxyCODONE (OXYCONTIN) 20 mg 12 hr tablet Take 1 tablet (20 mg total) by mouth every 12 (twelve) hours. 30 tablet 0  . pantoprazole (PROTONIX) 40 MG tablet TAKE 1 TABLET BY MOUTH ONCE DAILY 30 tablet 0  . phenazopyridine (PYRIDIUM) 200 MG tablet Take 1 tablet (200 mg total) by mouth 3 (three) times daily as needed for pain. 10 tablet 0  . prochlorperazine (COMPAZINE) 10 MG tablet TAKE 1 TABLET BY MOUTH EVERY 6 HOURS AS NEEDED FOR NAUSEA & VOMITING 30 tablet 1  . promethazine (PHENERGAN) 25 MG suppository Place 1 suppository (25 mg total) rectally every 6 (six) hours as needed for nausea or vomiting. 12 each 1  . promethazine (PHENERGAN) 25 MG tablet Take 1 tablet (25 mg total) by mouth every 6 (six) hours as needed for nausea or vomiting. 30 tablet 1  . sulfamethoxazole-trimethoprim (BACTRIM DS) 800-160 MG tablet Take 1 tablet by mouth 2 (two) times daily. 14 tablet 0  . SYNTHROID 75 MCG tablet Take 1 tablet by mouth once daily (Patient taking differently: Take 75 mcg by mouth daily before breakfast. ) 90 tablet 3   No current facility-administered medications for this encounter.    Physical Findings: The patient is in no acute distress. Patient is alert and oriented.  . weight is 148 lb (67.1 kg). Her temperature is 98 F  (36.7 C). Her blood pressure is 105/75 and her pulse is 114 (abnormal). Her respiration is 20 and oxygen saturation is 100%.  Lungs are clear to auscultation bilaterally. Heart has regular  Rhythm with increased rate. Abdomen soft, non-tender, normal bowel sounds.  The right chest wall is covered with gentian violet.  Limited exam shows further shrinkage of the most significant nodules, in addition to some softening of these nodules.  nodule in the right flank palpates relatively the same size compared to last week.  Lab Findings: Lab Results  Component Value Date   WBC 8.7 08/25/2019   HGB 9.9 (L) 08/25/2019   HCT 31.1 (L) 08/25/2019   MCV 91.7 08/25/2019   PLT 377 08/25/2019    Radiographic Findings: DG Chest 2 View  Result Date: 08/20/2019 CLINICAL DATA:  Cough and fever.  Recurrent cancer. EXAM: CHEST - 2 VIEW COMPARISON:  PET scan 06/11/2019.  Chest CT  05/13/2019. FINDINGS: Heart size is normal. Power port has its tip in the SVC above the right atrium. Previous right mastectomy. Multiple new and rapidly progressive pulmonary nodules throughout both lungs, more extensive on the left than the right. These were not visible on 05/13/2019 by CT and were only beginning to be visible on the PET scan 06/11/2019. No effusion. No bone abnormality. IMPRESSION: Rapid and extensive development of multiple widespread pulmonary nodules consistent with metastatic disease. The nodules are under 1 cm in size. There is no pulmonary consolidation, collapse or effusion. Electronically Signed   By: Nelson Chimes M.D.   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  Blood work today shows no significant issues except her albumin continues to decrease.  She will begin taking liquid protein supplements to help with this issue.  Plan: The patient is  scheduled to follow-up with Dr. Wetzel Bjornstad on 08/31/2019.  She will have a PET scan that day at [facility]. she will follow-up with radiation oncology on July 27.   ____________________________________   Blair Promise, PhD, MD  This document serves as a record of services personally performed by [name], MD. It was created on his behalf by Clerance Lav, a trained medical scribe. The creation of this record is based on the scribe's personal observations and the provider's statements to them. This document has been checked and approved by the attending provider.

## 2019-08-24 NOTE — Telephone Encounter (Signed)
RN called patient to remind her of her appt. Tomorrow at 4 pm and to f/u on a phone note from the weekend. Patient states she has called the pharmacy for some more doxycycline. She reports she has continued to have a low grade fever of 101.1. she plans to keep her appt. Tomorrow and will need more pain medicine at that time. Patient did not go to the ED this weekend.

## 2019-08-25 ENCOUNTER — Ambulatory Visit
Admission: RE | Admit: 2019-08-25 | Discharge: 2019-08-25 | Disposition: A | Discharge status: Home / Self Care | Payer: Medicaid Other | Source: Ambulatory Visit | Attending: Radiation Oncology | Admitting: Radiation Oncology

## 2019-08-25 ENCOUNTER — Other Ambulatory Visit: Payer: Self-pay

## 2019-08-25 ENCOUNTER — Encounter: Payer: Self-pay | Admitting: Radiation Oncology

## 2019-08-25 ENCOUNTER — Inpatient Hospital Stay: Payer: Medicaid Other

## 2019-08-25 VITALS — BP 105/75 | HR 114 | Temp 98.0°F | Resp 20 | Wt 148.0 lb

## 2019-08-25 DIAGNOSIS — Z923 Personal history of irradiation: Secondary | ICD-10-CM | POA: Diagnosis not present

## 2019-08-25 DIAGNOSIS — N6310 Unspecified lump in the right breast, unspecified quadrant: Secondary | ICD-10-CM | POA: Diagnosis not present

## 2019-08-25 DIAGNOSIS — N390 Urinary tract infection, site not specified: Secondary | ICD-10-CM

## 2019-08-25 DIAGNOSIS — Z171 Estrogen receptor negative status [ER-]: Secondary | ICD-10-CM

## 2019-08-25 DIAGNOSIS — Z95828 Presence of other vascular implants and grafts: Secondary | ICD-10-CM

## 2019-08-25 DIAGNOSIS — R918 Other nonspecific abnormal finding of lung field: Secondary | ICD-10-CM | POA: Insufficient documentation

## 2019-08-25 DIAGNOSIS — C50311 Malignant neoplasm of lower-inner quadrant of right female breast: Secondary | ICD-10-CM | POA: Diagnosis not present

## 2019-08-25 DIAGNOSIS — R05 Cough: Secondary | ICD-10-CM | POA: Insufficient documentation

## 2019-08-25 DIAGNOSIS — Z79899 Other long term (current) drug therapy: Secondary | ICD-10-CM | POA: Insufficient documentation

## 2019-08-25 DIAGNOSIS — Z9011 Acquired absence of right breast and nipple: Secondary | ICD-10-CM | POA: Diagnosis not present

## 2019-08-25 DIAGNOSIS — C50811 Malignant neoplasm of overlapping sites of right female breast: Secondary | ICD-10-CM | POA: Diagnosis not present

## 2019-08-25 DIAGNOSIS — R110 Nausea: Secondary | ICD-10-CM | POA: Insufficient documentation

## 2019-08-25 DIAGNOSIS — R319 Hematuria, unspecified: Secondary | ICD-10-CM

## 2019-08-25 LAB — CBC WITH DIFFERENTIAL (CANCER CENTER ONLY)
Abs Immature Granulocytes: 0.04 10*3/uL (ref 0.00–0.07)
Basophils Absolute: 0 10*3/uL (ref 0.0–0.1)
Basophils Relative: 0 %
Eosinophils Absolute: 0.3 10*3/uL (ref 0.0–0.5)
Eosinophils Relative: 4 %
HCT: 31.1 % — ABNORMAL LOW (ref 36.0–46.0)
Hemoglobin: 9.9 g/dL — ABNORMAL LOW (ref 12.0–15.0)
Immature Granulocytes: 1 %
Lymphocytes Relative: 5 %
Lymphs Abs: 0.5 10*3/uL — ABNORMAL LOW (ref 0.7–4.0)
MCH: 29.2 pg (ref 26.0–34.0)
MCHC: 31.8 g/dL (ref 30.0–36.0)
MCV: 91.7 fL (ref 80.0–100.0)
Monocytes Absolute: 0.8 10*3/uL (ref 0.1–1.0)
Monocytes Relative: 9 %
Neutro Abs: 7.1 10*3/uL (ref 1.7–7.7)
Neutrophils Relative %: 81 %
Platelet Count: 377 10*3/uL (ref 150–400)
RBC: 3.39 MIL/uL — ABNORMAL LOW (ref 3.87–5.11)
RDW: 17.9 % — ABNORMAL HIGH (ref 11.5–15.5)
WBC Count: 8.7 10*3/uL (ref 4.0–10.5)
nRBC: 0 % (ref 0.0–0.2)

## 2019-08-25 LAB — CMP (CANCER CENTER ONLY)
ALT: 18 U/L (ref 0–44)
AST: 16 U/L (ref 15–41)
Albumin: 2.3 g/dL — ABNORMAL LOW (ref 3.5–5.0)
Alkaline Phosphatase: 126 U/L (ref 38–126)
Anion gap: 11 (ref 5–15)
BUN: 8 mg/dL (ref 6–20)
CO2: 23 mmol/L (ref 22–32)
Calcium: 9.5 mg/dL (ref 8.9–10.3)
Chloride: 103 mmol/L (ref 98–111)
Creatinine: 0.66 mg/dL (ref 0.44–1.00)
GFR, Est AFR Am: >60 mL/min (ref >60)
GFR, Estimated: >60 mL/min (ref >60)
Glucose, Bld: 104 mg/dL — ABNORMAL HIGH (ref 70–99)
Potassium: 3.8 mmol/L (ref 3.5–5.1)
Sodium: 137 mmol/L (ref 135–145)
Total Bilirubin: 0.3 mg/dL (ref 0.3–1.2)
Total Protein: 6.7 g/dL (ref 6.5–8.1)

## 2019-08-25 LAB — MAGNESIUM: Magnesium: 1.9 mg/dL (ref 1.7–2.4)

## 2019-08-25 MED ORDER — OXYCODONE HCL ER 20 MG PO T12A: 20.0000 mg | EXTENDED_RELEASE_TABLET | Freq: Two times a day (BID) | ORAL | 0 refills | Status: DC

## 2019-08-25 MED ORDER — SODIUM CHLORIDE 0.9% FLUSH
10.0000 mL | Freq: Once | INTRAVENOUS | Status: AC
  Administered 2019-08-25: 10 mL
  Filled 2019-08-25: qty 10

## 2019-08-25 MED ORDER — HEPARIN SOD (PORK) LOCK FLUSH 100 UNIT/ML IV SOLN
500.0000 [IU] | Freq: Once | INTRAVENOUS | Status: AC
  Administered 2019-08-25: 500 [IU]
  Filled 2019-08-25: qty 5

## 2019-08-25 MED ORDER — SULFAMETHOXAZOLE-TRIMETHOPRIM 800-160 MG PO TABS: 1.0000 | ORAL_TABLET | Freq: Two times a day (BID) | ORAL | 0 refills | Status: DC

## 2019-08-25 MED ORDER — PROMETHAZINE HCL 25 MG PO TABS
25.0000 mg | ORAL_TABLET | Freq: Four times a day (QID) | ORAL | 1 refills | Status: DC | PRN
Start: 2019-08-25 — End: 2020-09-16

## 2019-08-25 MED ORDER — OXYCODONE HCL 5 MG PO TABS: 5.0000 mg | ORAL_TABLET | ORAL | 0 refills | Status: DC | PRN

## 2019-08-25 MED FILL — PROMETHAZINE 25 MG TABLET: 25 | 7 days supply | Qty: 30 | Fill #0

## 2019-08-25 MED FILL — oxyCODONE HCL 5 MG TABS: 5 | 5 days supply | Qty: 60 | Fill #0

## 2019-08-25 NOTE — Telephone Encounter (Signed)
Error

## 2019-08-25 NOTE — Patient Instructions (Signed)

## 2019-08-25 NOTE — Progress Notes (Signed)
Patient here for a f/u visit. She reports a decreased appeitie and an ongoing low grade temperature  Ranging from 99.6 to 100.8. Patient asking is she needs more doxycycline and is requesting a refill of her pain medicine. Patient drinking about a gallon of fluid a day between water anddeit [facility]. Patient taking nausea meds. Pain today is an 8 in her chest and sacrum. Has tightness in her chest and is  Uncomfortable to sleep on either side.  BP 105/75 (BP Location: Left Arm, Patient Position: Sitting, Cuff Size: Normal)   Pulse (!) 114   Temp 98 F (36.7 C)   Resp 20   Wt 148 lb (67.1 kg)   SpO2 100%   BMI 24.63 kg/m   Wt Readings from Last 3 Encounters:  08/25/19 148 lb (67.1 kg)  08/20/19 147 lb 6 oz (66.8 kg)  08/18/19 148 lb 6 oz (67.3 kg)

## 2019-08-26 MED FILL — SULFAMETHOXAZOLE-TMP DS TAB: 800-160 | 14 days supply | Qty: 28 | Fill #0

## 2019-08-31 ENCOUNTER — Other Ambulatory Visit: Payer: Self-pay | Admitting: Radiation Oncology

## 2019-08-31 ENCOUNTER — Other Ambulatory Visit: Payer: Self-pay | Admitting: Medical

## 2019-08-31 DIAGNOSIS — R319 Hematuria, unspecified: Secondary | ICD-10-CM

## 2019-08-31 DIAGNOSIS — N390 Urinary tract infection, site not specified: Secondary | ICD-10-CM

## 2019-08-31 DIAGNOSIS — C50911 Malignant neoplasm of unspecified site of right female breast: Secondary | ICD-10-CM | POA: Diagnosis not present

## 2019-08-31 MED ORDER — OXYCODONE HCL 5 MG PO TABS: 5.0000 mg | ORAL_TABLET | ORAL | 0 refills | Status: DC | PRN

## 2019-08-31 MED ORDER — PHENAZOPYRIDINE HCL 200 MG PO TABS: 200.0000 mg | ORAL_TABLET | Freq: Three times a day (TID) | ORAL | 1 refills | Status: DC | PRN

## 2019-08-31 MED FILL — PHENAZOPYRIDINE 200 MG TAB: 200 | 4 days supply | Qty: 10 | Fill #0

## 2019-08-31 MED FILL — oxyCODONE HCL 5 MG TABS: 5 | 5 days supply | Qty: 60 | Fill #0

## 2019-09-01 ENCOUNTER — Ambulatory Visit
Admission: RE | Admit: 2019-09-01 | Discharge: 2019-09-01 | Disposition: A | Discharge status: Home / Self Care | Payer: Medicaid Other | Source: Ambulatory Visit | Attending: Radiation Oncology | Admitting: Radiation Oncology

## 2019-09-01 ENCOUNTER — Other Ambulatory Visit: Payer: Self-pay | Admitting: Medical

## 2019-09-01 DIAGNOSIS — N390 Urinary tract infection, site not specified: Secondary | ICD-10-CM

## 2019-09-01 DIAGNOSIS — R319 Hematuria, unspecified: Secondary | ICD-10-CM

## 2019-09-01 MED ORDER — PHENAZOPYRIDINE HCL 200 MG PO TABS: 200.0000 mg | ORAL_TABLET | Freq: Three times a day (TID) | ORAL | 1 refills | Status: DC | PRN

## 2019-09-02 ENCOUNTER — Other Ambulatory Visit (HOSPITAL_COMMUNITY): Payer: Self-pay | Admitting: Internal Medicine

## 2019-09-02 DIAGNOSIS — C50911 Malignant neoplasm of unspecified site of right female breast: Secondary | ICD-10-CM | POA: Diagnosis not present

## 2019-09-02 DIAGNOSIS — C779 Secondary and unspecified malignant neoplasm of lymph node, unspecified: Secondary | ICD-10-CM | POA: Diagnosis not present

## 2019-09-02 DIAGNOSIS — Z171 Estrogen receptor negative status [ER-]: Secondary | ICD-10-CM | POA: Diagnosis not present

## 2019-09-02 DIAGNOSIS — R05 Cough: Secondary | ICD-10-CM | POA: Diagnosis not present

## 2019-09-02 DIAGNOSIS — R52 Pain, unspecified: Secondary | ICD-10-CM | POA: Diagnosis not present

## 2019-09-02 DIAGNOSIS — C787 Secondary malignant neoplasm of liver and intrahepatic bile duct: Secondary | ICD-10-CM | POA: Diagnosis not present

## 2019-09-02 DIAGNOSIS — C7800 Secondary malignant neoplasm of unspecified lung: Secondary | ICD-10-CM | POA: Diagnosis not present

## 2019-09-02 DIAGNOSIS — F329 Major depressive disorder, single episode, unspecified: Secondary | ICD-10-CM | POA: Diagnosis not present

## 2019-09-02 DIAGNOSIS — F419 Anxiety disorder, unspecified: Secondary | ICD-10-CM | POA: Diagnosis not present

## 2019-09-02 DIAGNOSIS — C7989 Secondary malignant neoplasm of other specified sites: Secondary | ICD-10-CM | POA: Diagnosis not present

## 2019-09-02 DIAGNOSIS — Z79899 Other long term (current) drug therapy: Secondary | ICD-10-CM | POA: Diagnosis not present

## 2019-09-02 MED FILL — LIDOCAINE-PRILOCAINE 2.5-2.: 2.5-2.5 | 14 days supply | Qty: 30 | Fill #0

## 2019-09-02 MED FILL — METHOCARBAMOL 500 MG TABS: 500 | 30 days supply | Qty: 30 | Fill #0

## 2019-09-03 ENCOUNTER — Encounter: Payer: Self-pay | Admitting: Radiation Oncology

## 2019-09-03 ENCOUNTER — Ambulatory Visit
Admission: RE | Admit: 2019-09-03 | Discharge: 2019-09-03 | Disposition: A | Discharge status: Home / Self Care | Payer: Medicaid Other | Source: Ambulatory Visit | Attending: Radiation Oncology | Admitting: Radiation Oncology

## 2019-09-03 VITALS — BP 116/83 | HR 114 | Temp 98.6°F | Resp 17 | Wt 145.4 lb

## 2019-09-03 DIAGNOSIS — C50811 Malignant neoplasm of overlapping sites of right female breast: Secondary | ICD-10-CM

## 2019-09-03 DIAGNOSIS — C7800 Secondary malignant neoplasm of unspecified lung: Secondary | ICD-10-CM | POA: Diagnosis not present

## 2019-09-03 DIAGNOSIS — C50311 Malignant neoplasm of lower-inner quadrant of right female breast: Secondary | ICD-10-CM | POA: Diagnosis not present

## 2019-09-03 DIAGNOSIS — R5383 Other fatigue: Secondary | ICD-10-CM | POA: Insufficient documentation

## 2019-09-03 DIAGNOSIS — Z171 Estrogen receptor negative status [ER-]: Secondary | ICD-10-CM | POA: Diagnosis not present

## 2019-09-03 DIAGNOSIS — Z923 Personal history of irradiation: Secondary | ICD-10-CM | POA: Insufficient documentation

## 2019-09-03 DIAGNOSIS — C792 Secondary malignant neoplasm of skin: Secondary | ICD-10-CM | POA: Diagnosis not present

## 2019-09-03 DIAGNOSIS — Z79899 Other long term (current) drug therapy: Secondary | ICD-10-CM | POA: Diagnosis not present

## 2019-09-03 LAB — URINALYSIS, COMPLETE (UACMP) WITH MICROSCOPIC: WBC, UA: >50 WBC/hpf — ABNORMAL HIGH (ref 0–5)

## 2019-09-03 MED ORDER — HYDROCOD POLST-CPM POLST ER 10-8 MG/5ML PO SUER: 5.0000 mL | Freq: Two times a day (BID) | ORAL | 0 refills | Status: DC | PRN

## 2019-09-03 NOTE — Progress Notes (Signed)
Radiation Oncology         (336) [phone] ________________________________  Name: Kara Pittman MRN: [id number]  Date: 09/03/2019  DOB: 1965-08-17  Follow-Up Visit Note  CC: Andria Frames Jamal Collin, MD  [name], MD    ICD-10-CM   1. Malignant neoplasm of lower-inner quadrant of right breast of female, estrogen receptor negative ([place])  C50.311    Z17.1   2. 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  Radiation Treatment Dates: 06/18/2019 through 08/06/2019 Site Technique Total Dose (Gy) Dose per Fx (Gy) Completed Fx Beam Energies  Chest Wall, Right: CW_Rt_Bst Electron 12/12 2 6/6 9E  Chest Wall, Right: CW_Rt_Bst_e- Electron 4/4 2 2/2 9E  Chest Wall, Right: CW_Rt 3D 50/50 2 25/25 6X  Chest Wall, Right: CW_Rt_SCV 3D 50/50 2 25/25 6X   Cumulative dose to the largest nodules along the right chest wall of 66 Gy in 33 fractions along with radiosensitizing chemotherapy.  Narrative:  The patient returns today for close follow-up and skin recheck. She was last seen in follow-up on 08/25/2019, during which time she continued to report fevers and nausea. I had advised her to switch from Doxycycline back to Bactrim given her ongoing nausea. She was also given a prescription for Phenergan in addition to her Zofran and Compazine. She was also prescribed OxyContin twice daily in light of her uncontrolled pain despite her narcotic regimen at that time.  Unfortunately this medication has not been approved by her Medicaid provider as of yet.  I called the pharmacy to expedite this prescription today.  Regarding her skin, the right chest wall was covered with gentian violet, which limited her  examination at that time. However, there was noted to be further shrinkage of the most significant nodules in addition to some softening of those nodules. The nodule in the right flank was relatively the same size as the week prior.  She underwent a PET scan on 08/31/2019 at [facility]. Results showed extensive new and worsening hypermetabolic disease involving the right chest wall, left hemidiaphragm, liver, right adrenal and multiple muscles as well as the right hemi-sacrum, consistent with widespread metastatic disease.  Of note, she was last seen by Dr. Wetzel Bjornstad yesterday, 09/02/2019, and is scheduled to follow-up on 09/14/2019.  Today, she reports extreme fatigue. She denies breathing problems at this time.  She continues to have a dry cough affecting her sleep and will refill her Tussionex for this issue.  She denies any hemoptysis.  Her nausea is better after switching back to Bactrim per discussion she is missed couple days taking this medication in light of her problems with nausea.   ALLERGIES:  has No Known Allergies.   Meds: Current Outpatient Medications  Medication Sig Dispense Refill  . fluconazole (DIFLUCAN) 150 MG tablet Take by mouth.    Marland Kitchen acetaminophen (TYLENOL) 500 MG tablet Take 500-1,000 mg by mouth every 6 (six) hours as needed for mild pain or headache.    . b complex vitamins tablet Take 1 tablet by mouth at bedtime.    . bimatoprost (LATISSE) 0.03 % ophthalmic solution Place 0.03 mLs into both eyes at bedtime. Place one drop on applicator and apply evenly along the skin of the upper eyelid at base of eyelashes once daily at bedtime; repeat procedure  for second eye (use a clean applicator). 5 mL 3  . buPROPion (WELLBUTRIN XL) 150 MG 24 hr tablet Take 1 tablet by mouth daily (Patient taking differently: Take 150 mg by mouth in the morning. ) 90 tablet 3  . CALCIUM PO Take 1 tablet by mouth at bedtime.    . chlorpheniramine-HYDROcodone (TUSSIONEX) 10-8 MG/5ML SUER Take 5 mLs by  mouth every 12 (twelve) hours as needed for cough. 115 mL 0  . DIFFERIN 0.1 % cream APPLY TO THE AFFECTED AREA(S) TWICE DAILY 45 g 0  . docusate sodium (COLACE) 100 MG capsule Take 1 capsule (100 mg total) by mouth 2 (two) times daily. 20 capsule 0  . doxycycline (VIBRA-TABS) 100 MG tablet Take 1 tablet (100 mg total) by mouth 2 (two) times daily. 14 tablet 0  . fluconazole (DIFLUCAN) 150 MG tablet Take 150 mg by mouth once.    . gabapentin (NEURONTIN) 300 MG capsule Take 1 capsule (300 mg total) by mouth at bedtime. 30 capsule 1  . lidocaine-prilocaine (EMLA) cream Apply to affected area once 30 g 3  . LORazepam (ATIVAN) 0.5 MG tablet Take 1 tablet (0.5 mg total) by mouth at bedtime as needed for sleep. 30 tablet 0  . methocarbamol (ROBAXIN) 500 MG tablet Take 1 tablet (500 mg total) by mouth every 6 (six) hours as needed for muscle spasms. 20 tablet 0  . Multiple Vitamins-Calcium (ONE-A-DAY WOMENS FORMULA) TABS Take 1 tablet by mouth at bedtime.    . ondansetron (ZOFRAN-ODT) 8 MG disintegrating tablet Take 1 tablet (8 mg total) by mouth every 8 (eight) hours as needed for nausea or vomiting. (Patient taking differently: Take 8 mg by mouth every 8 (eight) hours as needed for nausea or vomiting (DISSOLVE ORALLY). ) 20 tablet 3  . oxyCODONE (OXY IR/ROXICODONE) 5 MG immediate release tablet Take 1-2 tablets (5-10 mg total) by mouth every 4 (four) hours as needed for moderate pain. 60 tablet 0  . oxyCODONE (OXYCONTIN) 20 mg 12 hr tablet Take 1 tablet (20 mg total) by mouth every 12 (twelve) hours. 30 tablet 0  . pantoprazole (PROTONIX) 40 MG tablet TAKE 1 TABLET BY MOUTH ONCE DAILY 30 tablet 0  . phenazopyridine (PYRIDIUM) 200 MG tablet Take 1 tablet (200 mg total) by mouth 3 (three) times daily as needed for pain. 10 tablet 1  . prochlorperazine (COMPAZINE) 10 MG tablet TAKE 1 TABLET BY MOUTH EVERY 6 HOURS AS NEEDED FOR NAUSEA & VOMITING 30 tablet 1  . promethazine (PHENERGAN) 25 MG suppository Place 1  suppository (25 mg total) rectally every 6 (six) hours as needed for nausea or vomiting. 12 each 1  . promethazine (PHENERGAN) 25 MG tablet Take 1 tablet (25 mg total) by mouth every 6 (six) hours as needed for nausea or vomiting. 30 tablet 1  . sulfamethoxazole-trimethoprim (BACTRIM DS) 800-160 MG tablet Take 1 tablet by mouth 2 (two) times daily. 28 tablet 0  . SYNTHROID 75 MCG tablet Take 1 tablet by mouth once daily (Patient taking differently: Take 75 mcg by mouth daily before breakfast. ) 90 tablet 3   No current facility-administered medications for this encounter.    Physical Findings: The patient is in no acute distress. Patient is alert and oriented. . weight is 145 lb 6.4 oz (66 kg). Her temperature is 98.6 F (37 C). Her blood pressure is 116/83 and her pulse is 114 (abnormal). Her respiration is 17 and oxygen saturation is 100%.  The lungs are clear.  The heart  has a regular rhythm with increased rate.  The abdomen is soft and nontender with normal bowel sounds.  Chest wall exam as above   Lab Findings: Lab Results  Component Value Date   WBC 8.7 08/25/2019   HGB 9.9 (L) 08/25/2019   HCT 31.1 (L) 08/25/2019   MCV 91.7 08/25/2019   PLT 377 08/25/2019    Radiographic Findings: DG Chest 2 View  Result Date: 08/20/2019 CLINICAL DATA:  Cough and fever.  Recurrent cancer. EXAM: CHEST - 2 VIEW COMPARISON:  PET scan 06/11/2019.  Chest CT 05/13/2019. FINDINGS: Heart size is normal. Power port has its tip in the SVC above the right atrium. Previous right mastectomy. Multiple new and rapidly progressive pulmonary nodules throughout both lungs, more extensive on the left than the right. These were not visible on 05/13/2019 by CT and were only beginning to be visible on the PET scan 06/11/2019. No effusion. No bone abnormality. IMPRESSION: Rapid and extensive development of multiple widespread pulmonary nodules consistent with metastatic disease. The nodules are under 1 cm in size. There is  no pulmonary consolidation, collapse or effusion. Electronically Signed   By: Nelson Chimes M.D.   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  Unfortunately with rapid progression she cannot wait until her right chest wall is healed to begin systemic therapy.  Plan: The patient is scheduled to follow-up with Dr. Wetzel Bjornstad on 09/14/2019.  She will return tomorrow for IV fluid supplementation to see if this will help with her generalized fatigue.  Patient submitted a urine specimen today as she continues to have urinary symptoms and will makes sure that Bactrim is covering the potential bladder infection.  She will begin systemic therapy at [facility] early next week, details unknown at this time but the patient reports she will be receiving 3 different agents.   ____________________________________   Blair Promise, PhD, MD  This document serves as a record of services personally performed by [name], MD. It was created on his behalf by Clerance Lav, a trained medical scribe. The creation of this record is based on the scribe's personal observations and the provider's statements to them. This document has been checked and approved by the attending provider.

## 2019-09-03 NOTE — Progress Notes (Signed)
Patient here for a f/u visit with Dr. Sondra Come. Patient reports feeling very weak. Reports a poor appetite. Patient drinking, tea, water and Pedialyte. Patient reports urinary frequency and dysuria despite taking Bactrim bid. She is also taking Pyridium. Patient is to start Immunotherapy on Monday at [facility]. Patient states her chest is slowly healing.  BP 116/83 (BP Location: Left Arm, Patient Position: Sitting, Cuff Size: Normal)   Pulse (!) 114   Temp 98.6 F (37 C)   Resp 17   Wt 145 lb 6.4 oz (66 kg)   SpO2 100%   BMI 24.20 kg/m   Wt Readings from Last 3 Encounters:  09/03/19 145 lb 6.4 oz (66 kg)  08/25/19 148 lb (67.1 kg)  08/20/19 147 lb 6 oz (66.8 kg)

## 2019-09-04 ENCOUNTER — Ambulatory Visit
Admission: RE | Admit: 2019-09-04 | Discharge: 2019-09-04 | Disposition: A | Discharge status: Home / Self Care | Payer: Medicaid Other | Source: Ambulatory Visit | Attending: Radiation Oncology | Admitting: Radiation Oncology

## 2019-09-04 ENCOUNTER — Encounter: Payer: Self-pay | Admitting: Medical

## 2019-09-04 ENCOUNTER — Other Ambulatory Visit: Payer: Self-pay

## 2019-09-04 ENCOUNTER — Other Ambulatory Visit: Payer: Self-pay | Admitting: Radiation Oncology

## 2019-09-04 DIAGNOSIS — E860 Dehydration: Secondary | ICD-10-CM

## 2019-09-04 DIAGNOSIS — Z171 Estrogen receptor negative status [ER-]: Secondary | ICD-10-CM | POA: Diagnosis not present

## 2019-09-04 DIAGNOSIS — C50311 Malignant neoplasm of lower-inner quadrant of right female breast: Secondary | ICD-10-CM | POA: Insufficient documentation

## 2019-09-04 MED ORDER — SODIUM CHLORIDE 0.9 % IV SOLN
INTRAVENOUS | Status: AC
  Filled 2019-09-04: qty 250

## 2019-09-04 MED ORDER — HYDROCODONE-HOMATROPINE 5-1.5 MG/5ML PO SYRP: 5.0000 mL | ORAL_SOLUTION | Freq: Four times a day (QID) | ORAL | 0 refills | Status: DC | PRN

## 2019-09-04 MED FILL — HYDROCODONE-CHLORPHEN ER SU: 10-8 | 11 days supply | Qty: 115 | Fill #0

## 2019-09-04 MED FILL — OxyCONTIN 20 MG T12A: 20 | 15 days supply | Qty: 30 | Fill #0

## 2019-09-04 NOTE — Progress Notes (Signed)
Patient came to the clinic about 1150 am and K. Sawiecki, RN accessed her port to provide IV hydration of 1000cc NS over 2 hours per order of Dr. Sondra Come.  [address] RN deacessed patient's port at 210 pm and patient was sent home accompanied by her mother via w/c. 2 pm T 100.4   Pulse 118 BP 109/73 Sat 96. Patient to call Dr. Isidore Moos if she has any problems while Dr. Sondra Come is out next week and is to see her primary Oncologist on 09/07/2019 at [facility] to start Immunotherapy.

## 2019-09-06 ENCOUNTER — Other Ambulatory Visit: Payer: Self-pay | Admitting: Radiation Oncology

## 2019-09-06 ENCOUNTER — Encounter: Payer: Self-pay | Admitting: Radiation Oncology

## 2019-09-06 LAB — URINE CULTURE: Culture: >=100000 — AB

## 2019-09-06 MED ORDER — CIPROFLOXACIN HCL 500 MG PO TABS
500.0000 mg | ORAL_TABLET | Freq: Two times a day (BID) | ORAL | 0 refills | Status: DC
Start: 2019-09-06 — End: 2019-09-24

## 2019-09-06 NOTE — Progress Notes (Signed)
Urine culture shows from 09/03/2019 shows E. Coli infection resistant to Bactrim. New Rx for Cipro 500 mg BID sent to [facility] on [address] since [facility] closed today. Let messages with mother Iran and Carleene Overlie Cox concerning new Rx and importance of starting today.  -----------------------------------  Blair Promise,  MD

## 2019-09-07 ENCOUNTER — Encounter: Payer: Self-pay | Admitting: Radiation Oncology

## 2019-09-07 ENCOUNTER — Telehealth: Payer: Self-pay | Admitting: Radiation Oncology

## 2019-09-07 DIAGNOSIS — N390 Urinary tract infection, site not specified: Secondary | ICD-10-CM | POA: Diagnosis not present

## 2019-09-07 DIAGNOSIS — R000 Tachycardia, unspecified: Secondary | ICD-10-CM | POA: Diagnosis not present

## 2019-09-07 DIAGNOSIS — R509 Fever, unspecified: Secondary | ICD-10-CM | POA: Diagnosis not present

## 2019-09-07 DIAGNOSIS — C7800 Secondary malignant neoplasm of unspecified lung: Secondary | ICD-10-CM | POA: Diagnosis not present

## 2019-09-07 DIAGNOSIS — Z20822 Contact with and (suspected) exposure to covid-19: Secondary | ICD-10-CM | POA: Diagnosis not present

## 2019-09-07 DIAGNOSIS — E039 Hypothyroidism, unspecified: Secondary | ICD-10-CM | POA: Diagnosis not present

## 2019-09-07 DIAGNOSIS — C50811 Malignant neoplasm of overlapping sites of right female breast: Secondary | ICD-10-CM | POA: Diagnosis not present

## 2019-09-07 DIAGNOSIS — Z95828 Presence of other vascular implants and grafts: Secondary | ICD-10-CM | POA: Diagnosis not present

## 2019-09-07 DIAGNOSIS — B9689 Other specified bacterial agents as the cause of diseases classified elsewhere: Secondary | ICD-10-CM | POA: Diagnosis not present

## 2019-09-07 DIAGNOSIS — R0902 Hypoxemia: Secondary | ICD-10-CM | POA: Diagnosis not present

## 2019-09-07 DIAGNOSIS — R791 Abnormal coagulation profile: Secondary | ICD-10-CM | POA: Diagnosis not present

## 2019-09-07 DIAGNOSIS — C801 Malignant (primary) neoplasm, unspecified: Secondary | ICD-10-CM | POA: Diagnosis not present

## 2019-09-07 DIAGNOSIS — D509 Iron deficiency anemia, unspecified: Secondary | ICD-10-CM | POA: Diagnosis not present

## 2019-09-07 DIAGNOSIS — C7900 Secondary malignant neoplasm of unspecified kidney and renal pelvis: Secondary | ICD-10-CM | POA: Diagnosis not present

## 2019-09-07 DIAGNOSIS — Z171 Estrogen receptor negative status [ER-]: Secondary | ICD-10-CM | POA: Diagnosis not present

## 2019-09-07 DIAGNOSIS — E871 Hypo-osmolality and hyponatremia: Secondary | ICD-10-CM | POA: Diagnosis not present

## 2019-09-07 DIAGNOSIS — C787 Secondary malignant neoplasm of liver and intrahepatic bile duct: Secondary | ICD-10-CM | POA: Diagnosis not present

## 2019-09-07 DIAGNOSIS — I959 Hypotension, unspecified: Secondary | ICD-10-CM | POA: Diagnosis not present

## 2019-09-07 DIAGNOSIS — A419 Sepsis, unspecified organism: Secondary | ICD-10-CM | POA: Diagnosis not present

## 2019-09-07 DIAGNOSIS — T368X5A Adverse effect of other systemic antibiotics, initial encounter: Secondary | ICD-10-CM | POA: Diagnosis not present

## 2019-09-07 DIAGNOSIS — Z9221 Personal history of antineoplastic chemotherapy: Secondary | ICD-10-CM | POA: Diagnosis not present

## 2019-09-07 DIAGNOSIS — Z87440 Personal history of urinary (tract) infections: Secondary | ICD-10-CM | POA: Diagnosis not present

## 2019-09-07 DIAGNOSIS — C7989 Secondary malignant neoplasm of other specified sites: Secondary | ICD-10-CM | POA: Diagnosis not present

## 2019-09-07 DIAGNOSIS — Z923 Personal history of irradiation: Secondary | ICD-10-CM | POA: Diagnosis not present

## 2019-09-07 DIAGNOSIS — G893 Neoplasm related pain (acute) (chronic): Secondary | ICD-10-CM | POA: Diagnosis not present

## 2019-09-07 DIAGNOSIS — Z79899 Other long term (current) drug therapy: Secondary | ICD-10-CM | POA: Diagnosis not present

## 2019-09-07 NOTE — Progress Notes (Signed)
Egbert Garibaldi, RN requested assistance with prior authorization fax for patients Oxycontin 20 mg every 12 hours, qty 30. Fax referenced MEDICARE PART D OPIOIDS ER and reported the patient's pharmacy coverage had expired. Spoke with Aaron Edelman, pharmacist, at [facility] who confirmed insurance went through for this script. He goes onto explain the script was filled and sold on September 04, 2019 and no further action is needed at this time.

## 2019-09-07 NOTE — Telephone Encounter (Signed)
Informed by Egbert Garibaldi, RN of need and inability to reach [facility]. Phoned [place] at [phone] and confirmed patients appointments for today. Phoned Adam Phenix and informed him of the following and provided him with the correct phone number for the [facility]. He verbalized understanding and appreciation for the assistance.   Present today to [place] by 1 pm for lab work. Then, within the same building present to clinic 22 to see Dr. Shirlee More. Finally, within the same building present to clinic 41 for chemo teaching.

## 2019-09-08 DIAGNOSIS — C50911 Malignant neoplasm of unspecified site of right female breast: Secondary | ICD-10-CM | POA: Diagnosis not present

## 2019-09-08 DIAGNOSIS — A419 Sepsis, unspecified organism: Secondary | ICD-10-CM | POA: Diagnosis not present

## 2019-09-08 DIAGNOSIS — C787 Secondary malignant neoplasm of liver and intrahepatic bile duct: Secondary | ICD-10-CM | POA: Diagnosis not present

## 2019-09-08 DIAGNOSIS — C50811 Malignant neoplasm of overlapping sites of right female breast: Secondary | ICD-10-CM | POA: Diagnosis not present

## 2019-09-08 DIAGNOSIS — R509 Fever, unspecified: Secondary | ICD-10-CM | POA: Diagnosis not present

## 2019-09-09 DIAGNOSIS — Z171 Estrogen receptor negative status [ER-]: Secondary | ICD-10-CM | POA: Diagnosis not present

## 2019-09-09 DIAGNOSIS — R509 Fever, unspecified: Secondary | ICD-10-CM | POA: Diagnosis not present

## 2019-09-09 DIAGNOSIS — G893 Neoplasm related pain (acute) (chronic): Secondary | ICD-10-CM | POA: Diagnosis not present

## 2019-09-09 DIAGNOSIS — C50811 Malignant neoplasm of overlapping sites of right female breast: Secondary | ICD-10-CM | POA: Diagnosis not present

## 2019-09-10 DIAGNOSIS — C50811 Malignant neoplasm of overlapping sites of right female breast: Secondary | ICD-10-CM | POA: Diagnosis not present

## 2019-09-10 DIAGNOSIS — Z171 Estrogen receptor negative status [ER-]: Secondary | ICD-10-CM | POA: Diagnosis not present

## 2019-09-10 DIAGNOSIS — R509 Fever, unspecified: Secondary | ICD-10-CM | POA: Diagnosis not present

## 2019-09-10 DIAGNOSIS — G893 Neoplasm related pain (acute) (chronic): Secondary | ICD-10-CM | POA: Diagnosis not present

## 2019-09-10 DIAGNOSIS — R918 Other nonspecific abnormal finding of lung field: Secondary | ICD-10-CM | POA: Diagnosis not present

## 2019-09-11 DIAGNOSIS — R509 Fever, unspecified: Secondary | ICD-10-CM | POA: Diagnosis not present

## 2019-09-11 DIAGNOSIS — E039 Hypothyroidism, unspecified: Secondary | ICD-10-CM | POA: Diagnosis not present

## 2019-09-11 DIAGNOSIS — C50811 Malignant neoplasm of overlapping sites of right female breast: Secondary | ICD-10-CM | POA: Diagnosis not present

## 2019-09-11 DIAGNOSIS — Z171 Estrogen receptor negative status [ER-]: Secondary | ICD-10-CM | POA: Diagnosis not present

## 2019-09-11 DIAGNOSIS — C50919 Malignant neoplasm of unspecified site of unspecified female breast: Secondary | ICD-10-CM | POA: Diagnosis not present

## 2019-09-11 DIAGNOSIS — G893 Neoplasm related pain (acute) (chronic): Secondary | ICD-10-CM | POA: Diagnosis not present

## 2019-09-12 DIAGNOSIS — R509 Fever, unspecified: Secondary | ICD-10-CM | POA: Diagnosis not present

## 2019-09-12 DIAGNOSIS — C50811 Malignant neoplasm of overlapping sites of right female breast: Secondary | ICD-10-CM | POA: Diagnosis not present

## 2019-09-12 DIAGNOSIS — G893 Neoplasm related pain (acute) (chronic): Secondary | ICD-10-CM | POA: Diagnosis not present

## 2019-09-12 DIAGNOSIS — E039 Hypothyroidism, unspecified: Secondary | ICD-10-CM | POA: Diagnosis not present

## 2019-09-14 DIAGNOSIS — Z171 Estrogen receptor negative status [ER-]: Secondary | ICD-10-CM | POA: Diagnosis not present

## 2019-09-14 DIAGNOSIS — C50811 Malignant neoplasm of overlapping sites of right female breast: Secondary | ICD-10-CM | POA: Diagnosis not present

## 2019-09-14 MED FILL — GABAPENTIN 300 MG CAPSULE: 300 | 30 days supply | Qty: 90 | Fill #0

## 2019-09-14 MED FILL — oxyCODONE HCL 10 MG TABS: 10 | 15 days supply | Qty: 120 | Fill #0

## 2019-09-15 MED FILL — NARCAN 4 MG NASAL SPRAY: 4 | 2 days supply | Qty: 2 | Fill #0

## 2019-09-18 MED FILL — AQUAPHOR OINTMENT: 28 days supply | Qty: 396 | Fill #0

## 2019-09-19 MED FILL — PROCHLORPERAZINE 10 MG TAB: 10 | 7 days supply | Qty: 30 | Fill #1

## 2019-09-21 ENCOUNTER — Other Ambulatory Visit: Payer: Self-pay | Admitting: Radiation Oncology

## 2019-09-21 MED ORDER — HYDROCOD POLST-CPM POLST ER 10-8 MG/5ML PO SUER: 5.0000 mL | Freq: Two times a day (BID) | ORAL | 0 refills | Status: DC | PRN

## 2019-09-22 ENCOUNTER — Ambulatory Visit
Admission: RE | Admit: 2019-09-22 | Discharge: 2019-09-22 | Disposition: A | Discharge status: Home / Self Care | Payer: Medicaid Other | Source: Ambulatory Visit | Attending: Radiation Oncology | Admitting: Radiation Oncology

## 2019-09-22 ENCOUNTER — Encounter: Payer: Self-pay | Admitting: Radiation Oncology

## 2019-09-22 ENCOUNTER — Other Ambulatory Visit: Payer: Self-pay

## 2019-09-22 VITALS — BP 103/74 | HR 108 | Temp 97.8°F | Resp 18 | Ht 65.0 in | Wt 150.4 lb

## 2019-09-22 DIAGNOSIS — Z87440 Personal history of urinary (tract) infections: Secondary | ICD-10-CM | POA: Diagnosis not present

## 2019-09-22 DIAGNOSIS — Z79899 Other long term (current) drug therapy: Secondary | ICD-10-CM | POA: Insufficient documentation

## 2019-09-22 DIAGNOSIS — Z923 Personal history of irradiation: Secondary | ICD-10-CM | POA: Insufficient documentation

## 2019-09-22 DIAGNOSIS — Z171 Estrogen receptor negative status [ER-]: Secondary | ICD-10-CM | POA: Diagnosis not present

## 2019-09-22 DIAGNOSIS — C50311 Malignant neoplasm of lower-inner quadrant of right female breast: Secondary | ICD-10-CM | POA: Insufficient documentation

## 2019-09-22 LAB — URINALYSIS, COMPLETE (UACMP) WITH MICROSCOPIC
Bilirubin Urine: NEGATIVE
Glucose, UA: NEGATIVE mg/dL
Hgb urine dipstick: NEGATIVE
Ketones, ur: NEGATIVE mg/dL
Nitrite: NEGATIVE
Protein, ur: 100 mg/dL — AB
Specific Gravity, Urine: 1.026 (ref 1.005–1.030)
WBC, UA: >50 WBC/hpf — ABNORMAL HIGH (ref 0–5)
pH: 5 (ref 5.0–8.0)

## 2019-09-22 MED FILL — HYDROCODONE-CHLORPHEN ER SU: 10-8 | 11 days supply | Qty: 115 | Fill #0

## 2019-09-22 NOTE — Progress Notes (Addendum)
Radiation Oncology         (336) [phone] ________________________________  Name: Kara Pittman MRN: [id number]  Date: 09/22/2019  DOB: [birth year]  Follow-Up Visit Note  CC: Andria Frames Jamal Collin, MD  [name], MD    ICD-10-CM   1. Malignant neoplasm of lower-inner quadrant of right breast of female, estrogen receptor negative ([place])  C50.311 Urinalysis, Complete w Microscopic   Z17.1 Urine culture    Diagnosis: StageIIB (ypT4b, pN0)RightBreast LIQ,InvasiveMetaplasticCarcinoma, ER-/ PR-/ Her2-,Grade 3(now metastatic)  Interval Since Last Radiation: 6.5 weeks.  Radiation Treatment Dates: 06/18/2019 through 08/06/2019 Site Technique Total Dose (Gy) Dose per Fx (Gy) Completed Fx Beam Energies  Chest Wall, Right: CW_Rt_Bst Electron 12/12 2 6/6 9E   Chest Wall, Right: CW_Rt_Bst_e- Electron 4/4 2 2/2 9E   Chest Wall, Right: CW_Rt 3D 50/50 2 25/25 6X   Chest Wall, Right: CW_Rt_SCV 3D 50/50 2 25/25 6X    Cumulative dose to the largest nodules along the right chest wall of 66 Gyin 33 fractions along with radiosensitizing chemotherapy.   Narrative:  The patient returns today for  follow-up.  Interval history is significant for the patient being admitted [facility] for urosepsis.  She presented for her first dose of immunotherapy and was noted to be quite ill with abnormal labs.  I reported urine culture results from the day before admission to Dr. Wetzel Bjornstad her medical oncologist showing the patient had an E. coli infection greater than 100,000 colonies.  This did show resistance to ampicillin as well as gentamicin and trimethoprim/sulfa.  Patient had previously been on Bactrim for her urinary tract infection.  Given this resistance to Bactrim she was switched on the Sunday once cultures came back the day prior to admission to Cipro but this did not address her issues quickly enough and hence admission as above.        She remained at [facility] for several days receiving transfusions  and IV antibiotic therapy.  She was seen by the wound care specialist while an inpatient and they recommended the area along the right chest wall area be cleansed with vashe wound therapy daily followed by aquaphor.        She complains of pain along the right flank region and is concerned maybe she may have recurrent urinary tract infection.  She denies any hematuria or significant dysuria at this time.  She also complains of pain in the right pelvis region.            ALLERGIES:  is allergic to morphine and related.  Meds: Current Outpatient Medications  Medication Sig Dispense Refill  . acetaminophen (TYLENOL) 500 MG tablet Take 500-1,000 mg by mouth every 6 (six) hours as needed for mild pain or headache.    . b complex vitamins tablet Take 1 tablet by mouth at bedtime.    . bimatoprost (LATISSE) 0.03 % ophthalmic solution Place 0.03 mLs into both eyes at bedtime. Place one drop on applicator and apply evenly along the skin of the upper eyelid at base of eyelashes once daily at bedtime; repeat procedure for second eye (use a clean applicator). 5 mL 3  . buPROPion (WELLBUTRIN XL) 150 MG 24 hr tablet Take 1 tablet by mouth daily (Patient taking differently: Take 150 mg by mouth in the morning. ) 90 tablet 3  . CALCIUM PO Take 1 tablet by mouth at bedtime.    . chlorpheniramine-HYDROcodone (TUSSIONEX) 10-8 MG/5ML SUER Take 5 mLs by mouth every 12 (twelve) hours as needed for  cough. 115 mL 0  . ciprofloxacin (CIPRO) 500 MG tablet Take 1 tablet (500 mg total) by mouth 2 (two) times daily. 14 tablet 0  . DIFFERIN 0.1 % cream APPLY TO THE AFFECTED AREA(S) TWICE DAILY 45 g 0  . docusate sodium (COLACE) 100 MG capsule Take 1 capsule (100 mg total) by mouth 2 (two) times daily. 20 capsule 0  . doxycycline (VIBRA-TABS) 100 MG tablet Take 1 tablet (100 mg total) by mouth 2 (two) times daily. 14 tablet 0  . fluconazole (DIFLUCAN) 150 MG tablet Take by mouth.    . fluconazole (DIFLUCAN) 150 MG tablet Take  150 mg by mouth once.    . gabapentin (NEURONTIN) 300 MG capsule Take 1 capsule (300 mg total) by mouth at bedtime. 30 capsule 1  . HYDROcodone-homatropine (HYCODAN) 5-1.5 MG/5ML syrup Take 5 mLs by mouth every 6 (six) hours as needed for cough. 473 mL 0  . lidocaine-prilocaine (EMLA) cream Apply to affected area once 30 g 3  . LORazepam (ATIVAN) 0.5 MG tablet Take 1 tablet (0.5 mg total) by mouth at bedtime as needed for sleep. 30 tablet 0  . methocarbamol (ROBAXIN) 500 MG tablet Take 1 tablet (500 mg total) by mouth every 6 (six) hours as needed for muscle spasms. 20 tablet 0  . Multiple Vitamins-Calcium (ONE-A-DAY WOMENS FORMULA) TABS Take 1 tablet by mouth at bedtime.    . ondansetron (ZOFRAN-ODT) 8 MG disintegrating tablet Take 1 tablet (8 mg total) by mouth every 8 (eight) hours as needed for nausea or vomiting. (Patient taking differently: Take 8 mg by mouth every 8 (eight) hours as needed for nausea or vomiting (DISSOLVE ORALLY). ) 20 tablet 3  . oxyCODONE (OXY IR/ROXICODONE) 5 MG immediate release tablet Take 1-2 tablets (5-10 mg total) by mouth every 4 (four) hours as needed for moderate pain. 60 tablet 0  . oxyCODONE (OXYCONTIN) 20 mg 12 hr tablet Take 1 tablet (20 mg total) by mouth every 12 (twelve) hours. 30 tablet 0  . pantoprazole (PROTONIX) 40 MG tablet TAKE 1 TABLET BY MOUTH ONCE DAILY 30 tablet 0  . phenazopyridine (PYRIDIUM) 200 MG tablet Take 1 tablet (200 mg total) by mouth 3 (three) times daily as needed for pain. 10 tablet 1  . prochlorperazine (COMPAZINE) 10 MG tablet TAKE 1 TABLET BY MOUTH EVERY 6 HOURS AS NEEDED FOR NAUSEA & VOMITING 30 tablet 1  . promethazine (PHENERGAN) 25 MG suppository Place 1 suppository (25 mg total) rectally every 6 (six) hours as needed for nausea or vomiting. 12 each 1  . promethazine (PHENERGAN) 25 MG tablet Take 1 tablet (25 mg total) by mouth every 6 (six) hours as needed for nausea or vomiting. 30 tablet 1  . sulfamethoxazole-trimethoprim  (BACTRIM DS) 800-160 MG tablet Take 1 tablet by mouth 2 (two) times daily. 28 tablet 0  . SYNTHROID 75 MCG tablet Take 1 tablet by mouth once daily (Patient taking differently: Take 75 mcg by mouth daily before breakfast. ) 90 tablet 3   No current facility-administered medications for this encounter.    Physical Findings: The patient is in no acute distress. Patient is alert and oriented.  She seems to have much more energy and is much more interactive and alert.  She is accompanied by her mother on evaluation today.  height is 5' 5"  (1.651 m) and weight is 150 lb 6.4 oz (68.2 kg). Her temperature is 97.8 F (36.6 C). Her blood pressure is 103/74 and her pulse is 108 (abnormal). Her  respiration is 18 and oxygen saturation is 100%. .  Examination of the right chest wall area reveals significant regression of multiple tumors nodules.  Patient has necrotic skin along the central portion of the chest wall area much of the edges of the treatment area have healed.  She appears to have new islands of skin growth superiorly along the right upper chest area.  No obvious signs of infection along the chest wall area.  She has a large subcutaneous mass along the right flank area estimated to be approximately 5 x 6 cm in size.  This is likely causing a lot of her discomfort in this region.  She does have pain in the right upper pelvis area which is likely related to her osseous metastasis (noted on recent PET scan) in this area.  Lab Findings: Lab Results  Component Value Date   WBC 8.7 08/25/2019   HGB 9.9 (L) 08/25/2019   HCT 31.1 (L) 08/25/2019   MCV 91.7 08/25/2019   PLT 377 08/25/2019    Radiographic Findings: No results found.  Impression: Patient is feeling much better  after several day admission as above.    Plan: She presented to the lab for urinalysis culture and sensitivity results pending at this time.  Patient was given samples of aquaphor and we will try to obtain vashe  Wound therapy  locally as home health could not provide this for at least a week.  Follow-up in 1 week.  She has started immunotherapy and will tentatively start chemotherapy later this month.  sodium chlor-hypochlorous acid (VASHE WOUND THERAPY) 0.033 % irrigation solution  ____________________________________ [name], MD

## 2019-09-22 NOTE — Progress Notes (Signed)
Kara Pittman  Here for a f/u visit with Dr. Sondra Come. Reports having spasms in her right kidney. Kara Pittman reports having a wound care consult while an inpatient at [facility]. Kara Pittman's mother concerned re: her chest wound whether she needed a different tx.  BP 103/74 (BP Location: Left Arm, Kara Pittman Position: Sitting, Cuff Size: Normal)   Pulse (!) 108   Temp 97.8 F (36.6 C)   Resp 18   Ht 5\' 5"  (1.651 m)   Wt 150 lb 6.4 oz (68.2 kg)   SpO2 100%   BMI 25.03 kg/m   Wt Readings from Last 3 Encounters:  09/22/19 150 lb 6.4 oz (68.2 kg)  09/03/19 145 lb 6.4 oz (66 kg)  08/25/19 148 lb (67.1 kg)

## 2019-09-24 ENCOUNTER — Other Ambulatory Visit: Payer: Self-pay | Admitting: Radiation Oncology

## 2019-09-24 LAB — URINE CULTURE: Culture: >=100000 — AB

## 2019-09-24 MED ORDER — CIPROFLOXACIN HCL 500 MG PO TABS
500.0000 mg | ORAL_TABLET | Freq: Two times a day (BID) | ORAL | 0 refills | Status: DC
Start: 2019-09-24 — End: 2019-10-02

## 2019-09-24 MED FILL — CIPROFLOXACIN HCL 500 MG TA: 500 | 6 days supply | Qty: 13 | Fill #0

## 2019-09-27 ENCOUNTER — Encounter (HOSPITAL_COMMUNITY): Payer: Self-pay | Admitting: Family Medicine

## 2019-09-27 ENCOUNTER — Other Ambulatory Visit: Payer: Self-pay

## 2019-09-27 ENCOUNTER — Inpatient Hospital Stay (HOSPITAL_COMMUNITY)
Admission: AD | Admit: 2019-09-27 | Discharge: 2019-10-02 | DRG: 872 | Disposition: A | Discharge status: Home / Self Care | Payer: Medicaid Other | Source: Ambulatory Visit | Attending: Internal Medicine | Admitting: Internal Medicine

## 2019-09-27 DIAGNOSIS — R638 Other symptoms and signs concerning food and fluid intake: Secondary | ICD-10-CM

## 2019-09-27 DIAGNOSIS — D638 Anemia in other chronic diseases classified elsewhere: Secondary | ICD-10-CM | POA: Diagnosis not present

## 2019-09-27 DIAGNOSIS — Z90710 Acquired absence of both cervix and uterus: Secondary | ICD-10-CM | POA: Diagnosis not present

## 2019-09-27 DIAGNOSIS — Z87440 Personal history of urinary (tract) infections: Secondary | ICD-10-CM | POA: Diagnosis not present

## 2019-09-27 DIAGNOSIS — Z1611 Resistance to penicillins: Secondary | ICD-10-CM | POA: Diagnosis present

## 2019-09-27 DIAGNOSIS — T300 Burn of unspecified body region, unspecified degree: Secondary | ICD-10-CM | POA: Diagnosis not present

## 2019-09-27 DIAGNOSIS — Z885 Allergy status to narcotic agent status: Secondary | ICD-10-CM

## 2019-09-27 DIAGNOSIS — D72829 Elevated white blood cell count, unspecified: Secondary | ICD-10-CM | POA: Diagnosis not present

## 2019-09-27 DIAGNOSIS — G893 Neoplasm related pain (acute) (chronic): Secondary | ICD-10-CM

## 2019-09-27 DIAGNOSIS — G43909 Migraine, unspecified, not intractable, without status migrainosus: Secondary | ICD-10-CM | POA: Diagnosis not present

## 2019-09-27 DIAGNOSIS — F329 Major depressive disorder, single episode, unspecified: Secondary | ICD-10-CM | POA: Diagnosis present

## 2019-09-27 DIAGNOSIS — A4150 Gram-negative sepsis, unspecified: Secondary | ICD-10-CM | POA: Diagnosis present

## 2019-09-27 DIAGNOSIS — C50919 Malignant neoplasm of unspecified site of unspecified female breast: Secondary | ICD-10-CM | POA: Diagnosis not present

## 2019-09-27 DIAGNOSIS — M1990 Unspecified osteoarthritis, unspecified site: Secondary | ICD-10-CM | POA: Diagnosis present

## 2019-09-27 DIAGNOSIS — Z79891 Long term (current) use of opiate analgesic: Secondary | ICD-10-CM | POA: Diagnosis not present

## 2019-09-27 DIAGNOSIS — Z9011 Acquired absence of right breast and nipple: Secondary | ICD-10-CM | POA: Diagnosis not present

## 2019-09-27 DIAGNOSIS — T148XXA Other injury of unspecified body region, initial encounter: Secondary | ICD-10-CM | POA: Diagnosis not present

## 2019-09-27 DIAGNOSIS — E860 Dehydration: Secondary | ICD-10-CM | POA: Diagnosis not present

## 2019-09-27 DIAGNOSIS — R509 Fever, unspecified: Secondary | ICD-10-CM | POA: Diagnosis not present

## 2019-09-27 DIAGNOSIS — E039 Hypothyroidism, unspecified: Secondary | ICD-10-CM | POA: Diagnosis not present

## 2019-09-27 DIAGNOSIS — Z79899 Other long term (current) drug therapy: Secondary | ICD-10-CM | POA: Diagnosis not present

## 2019-09-27 DIAGNOSIS — N390 Urinary tract infection, site not specified: Secondary | ICD-10-CM | POA: Diagnosis not present

## 2019-09-27 DIAGNOSIS — C7989 Secondary malignant neoplasm of other specified sites: Secondary | ICD-10-CM | POA: Diagnosis not present

## 2019-09-27 DIAGNOSIS — R627 Adult failure to thrive: Secondary | ICD-10-CM | POA: Diagnosis not present

## 2019-09-27 DIAGNOSIS — Z7952 Long term (current) use of systemic steroids: Secondary | ICD-10-CM

## 2019-09-27 DIAGNOSIS — R5381 Other malaise: Secondary | ICD-10-CM | POA: Diagnosis not present

## 2019-09-27 DIAGNOSIS — Z171 Estrogen receptor negative status [ER-]: Secondary | ICD-10-CM | POA: Diagnosis not present

## 2019-09-27 DIAGNOSIS — S21101A Unspecified open wound of right front wall of thorax without penetration into thoracic cavity, initial encounter: Secondary | ICD-10-CM

## 2019-09-27 DIAGNOSIS — A4151 Sepsis due to Escherichia coli [E. coli]: Principal | ICD-10-CM | POA: Diagnosis present

## 2019-09-27 DIAGNOSIS — D630 Anemia in neoplastic disease: Secondary | ICD-10-CM | POA: Diagnosis not present

## 2019-09-27 DIAGNOSIS — S21101D Unspecified open wound of right front wall of thorax without penetration into thoracic cavity, subsequent encounter: Secondary | ICD-10-CM | POA: Diagnosis not present

## 2019-09-27 DIAGNOSIS — Z20822 Contact with and (suspected) exposure to covid-19: Secondary | ICD-10-CM | POA: Diagnosis not present

## 2019-09-27 DIAGNOSIS — Z79890 Hormone replacement therapy (postmenopausal): Secondary | ICD-10-CM | POA: Diagnosis not present

## 2019-09-27 DIAGNOSIS — R112 Nausea with vomiting, unspecified: Secondary | ICD-10-CM | POA: Diagnosis not present

## 2019-09-27 LAB — URINALYSIS, ROUTINE W REFLEX MICROSCOPIC
Bacteria, UA: NONE SEEN
Bilirubin Urine: NEGATIVE
Glucose, UA: NEGATIVE mg/dL
Hgb urine dipstick: NEGATIVE
Ketones, ur: NEGATIVE mg/dL
Leukocytes,Ua: NEGATIVE
Nitrite: POSITIVE — AB
Protein, ur: NEGATIVE mg/dL
Specific Gravity, Urine: 1.012 (ref 1.005–1.030)
pH: 6 (ref 5.0–8.0)

## 2019-09-27 LAB — CBC WITH DIFFERENTIAL/PLATELET
Abs Immature Granulocytes: 0.17 10*3/uL — ABNORMAL HIGH (ref 0.00–0.07)
Basophils Absolute: 0.1 10*3/uL (ref 0.0–0.1)
Basophils Relative: 0 %
Eosinophils Absolute: 0.7 10*3/uL — ABNORMAL HIGH (ref 0.0–0.5)
Eosinophils Relative: 4 %
HCT: 31.2 % — ABNORMAL LOW (ref 36.0–46.0)
Hemoglobin: 9.4 g/dL — ABNORMAL LOW (ref 12.0–15.0)
Immature Granulocytes: 1 %
Lymphocytes Relative: 3 %
Lymphs Abs: 0.6 10*3/uL — ABNORMAL LOW (ref 0.7–4.0)
MCH: 28.7 pg (ref 26.0–34.0)
MCHC: 30.1 g/dL (ref 30.0–36.0)
MCV: 95.4 fL (ref 80.0–100.0)
Monocytes Absolute: 0.9 10*3/uL (ref 0.1–1.0)
Monocytes Relative: 6 %
Neutro Abs: 14.6 10*3/uL — ABNORMAL HIGH (ref 1.7–7.7)
Neutrophils Relative %: 86 %
Platelets: 413 10*3/uL — ABNORMAL HIGH (ref 150–400)
RBC: 3.27 MIL/uL — ABNORMAL LOW (ref 3.87–5.11)
RDW: 17.3 % — ABNORMAL HIGH (ref 11.5–15.5)
WBC: 17 10*3/uL — ABNORMAL HIGH (ref 4.0–10.5)
nRBC: 0 % (ref 0.0–0.2)

## 2019-09-27 LAB — PROTIME-INR
INR: 1.2 (ref 0.8–1.2)
Prothrombin Time: 14.2 seconds (ref 11.4–15.2)

## 2019-09-27 LAB — COMPREHENSIVE METABOLIC PANEL
ALT: 18 U/L (ref 0–44)
AST: 10 U/L — ABNORMAL LOW (ref 15–41)
Albumin: 2.4 g/dL — ABNORMAL LOW (ref 3.5–5.0)
Alkaline Phosphatase: 149 U/L — ABNORMAL HIGH (ref 38–126)
Anion gap: 12 (ref 5–15)
BUN: 8 mg/dL (ref 6–20)
CO2: 25 mmol/L (ref 22–32)
Calcium: 8.7 mg/dL — ABNORMAL LOW (ref 8.9–10.3)
Chloride: 98 mmol/L (ref 98–111)
Creatinine, Ser: 0.6 mg/dL (ref 0.44–1.00)
GFR calc Af Amer: >60 mL/min (ref >60)
GFR calc non Af Amer: >60 mL/min (ref >60)
Glucose, Bld: 90 mg/dL (ref 70–99)
Potassium: 3.6 mmol/L (ref 3.5–5.1)
Sodium: 135 mmol/L (ref 135–145)
Total Bilirubin: 0.6 mg/dL (ref 0.3–1.2)
Total Protein: 6.7 g/dL (ref 6.5–8.1)

## 2019-09-27 LAB — BRAIN NATRIURETIC PEPTIDE: B Natriuretic Peptide: 75.9 pg/mL (ref 0.0–100.0)

## 2019-09-27 LAB — PHOSPHORUS: Phosphorus: 3.3 mg/dL (ref 2.5–4.6)

## 2019-09-27 LAB — LACTIC ACID, PLASMA
Lactic Acid, Venous: 1.6 mmol/L (ref 0.5–1.9)
Lactic Acid, Venous: 1 mmol/L (ref 0.5–1.9)

## 2019-09-27 LAB — MAGNESIUM: Magnesium: 2.1 mg/dL (ref 1.7–2.4)

## 2019-09-27 LAB — SARS CORONAVIRUS 2 BY RT PCR (HOSPITAL ORDER, PERFORMED IN ~~LOC~~ HOSPITAL LAB): SARS Coronavirus 2: NEGATIVE

## 2019-09-27 MED ORDER — SODIUM CHLORIDE 0.9 % IV SOLN
2.0000 g | Freq: Three times a day (TID) | INTRAVENOUS | Status: DC
  Administered 2019-09-28 – 2019-09-30 (×8): 2 g via INTRAVENOUS
  Filled 2019-09-27 (×9): qty 2

## 2019-09-27 MED ORDER — ONDANSETRON HCL 4 MG/2ML IJ SOLN
4.0000 mg | Freq: Four times a day (QID) | INTRAMUSCULAR | Status: DC | PRN
  Administered 2019-09-28 – 2019-10-01 (×5): 4 mg via INTRAVENOUS
  Filled 2019-09-27 (×6): qty 2

## 2019-09-27 MED ORDER — SODIUM CHLORIDE 0.9 % IV SOLN: INTRAVENOUS | Status: DC

## 2019-09-27 MED ORDER — ACETAMINOPHEN 500 MG PO TABS
1000.0000 mg | ORAL_TABLET | Freq: Three times a day (TID) | ORAL | Status: DC
  Administered 2019-09-27 – 2019-10-02 (×15): 1000 mg via ORAL
  Filled 2019-09-27 (×15): qty 2

## 2019-09-27 MED ORDER — OXYCODONE HCL 5 MG PO TABS
5.0000 mg | ORAL_TABLET | ORAL | Status: DC | PRN
  Administered 2019-09-27 – 2019-09-28 (×4): 10 mg via ORAL
  Administered 2019-09-28: 5 mg via ORAL
  Administered 2019-09-28 – 2019-09-30 (×3): 10 mg via ORAL
  Administered 2019-09-30 – 2019-10-02 (×3): 5 mg via ORAL
  Filled 2019-09-27 (×2): qty 2
  Filled 2019-09-27: qty 1
  Filled 2019-09-27 (×6): qty 2
  Filled 2019-09-27: qty 1
  Filled 2019-09-27: qty 2
  Filled 2019-09-27: qty 1

## 2019-09-27 MED ORDER — SENNOSIDES-DOCUSATE SODIUM 8.6-50 MG PO TABS
1.0000 | ORAL_TABLET | Freq: Every evening | ORAL | Status: DC | PRN
  Administered 2019-09-28: 1 via ORAL

## 2019-09-27 MED ORDER — CHLORHEXIDINE GLUCONATE CLOTH 2 % EX PADS
6.0000 | MEDICATED_PAD | Freq: Every day | CUTANEOUS | Status: DC
  Administered 2019-09-28 – 2019-10-01 (×4): 6 via TOPICAL

## 2019-09-27 MED ORDER — ONDANSETRON HCL 4 MG PO TABS
4.0000 mg | ORAL_TABLET | Freq: Four times a day (QID) | ORAL | Status: DC | PRN
  Administered 2019-10-02 (×2): 4 mg via ORAL
  Filled 2019-09-27 (×2): qty 1

## 2019-09-27 MED ORDER — OXYCODONE HCL ER 20 MG PO T12A
20.0000 mg | EXTENDED_RELEASE_TABLET | Freq: Two times a day (BID) | ORAL | Status: DC
  Administered 2019-09-27 – 2019-10-02 (×9): 20 mg via ORAL
  Filled 2019-09-27 (×10): qty 1

## 2019-09-27 MED ORDER — SODIUM CHLORIDE 0.9% FLUSH
10.0000 mL | INTRAVENOUS | Status: DC | PRN
  Administered 2019-09-30: 10 mL

## 2019-09-27 MED ORDER — ENOXAPARIN SODIUM 40 MG/0.4ML ~~LOC~~ SOLN
40.0000 mg | SUBCUTANEOUS | Status: DC
  Administered 2019-09-27 – 2019-10-01 (×5): 40 mg via SUBCUTANEOUS
  Filled 2019-09-27 (×5): qty 0.4

## 2019-09-27 MED ORDER — SODIUM CHLORIDE 0.9 % IV SOLN
2.0000 g | Freq: Once | INTRAVENOUS | Status: AC
  Administered 2019-09-27: 2 g via INTRAVENOUS
  Filled 2019-09-27: qty 2

## 2019-09-27 MED ORDER — BISACODYL 10 MG RE SUPP: 10.0000 mg | Freq: Every day | RECTAL | Status: DC | PRN
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
## 2019-09-27 NOTE — Progress Notes (Signed)
   09/27/19 1335  Assess: MEWS Score  Temp 98.7 F (37.1 C)  BP 122/87  Pulse Rate (!) 121  Resp 19  SpO2 99 %  Assess: MEWS Score  MEWS Temp 0  MEWS Systolic 0  MEWS Pulse 2  MEWS RR 0  MEWS LOC 0  MEWS Score 2  MEWS Score Color Yellow  Assess: if the MEWS score is Yellow or Red  Were vital signs taken at a resting state? No  MEWS guidelines implemented *See Row Information* Yes  Take Vital Signs  Increase Vital Sign Frequency  Yellow: Q 2hr X 2 then Q 4hr X 2, if remains yellow, continue Q 4hrs  Admission vital signs with patient not at full resting state. Initiated admitted VS protocol. Md aware.

## 2019-09-27 NOTE — Progress Notes (Signed)
Pharmacy Antibiotic Note  Kara Pittman is a 54 y.o. female admitted on 09/27/2019 with UTI.  Pharmacy has been consulted for cefepime dosing.  Pt had recent UCx on 09/22/19 growing E.coli, prescribed ciprofloxacin PTA with no improvement.   Today, 09/27/19  WBC 17  SCr 0.6, CrCl >60 mL/min  Plan:  Cefepime 2 g IV q8h  Follow up renal function and culture data    Temp (24hrs), Avg:98.6 F (37 C), Min:98.5 F (36.9 C), Max:98.7 F (37.1 C)  Recent Labs  Lab 09/27/19 1634  WBC 17.0*  CREATININE 0.60    Estimated Creatinine Clearance: 70.7 mL/min (by C-G formula based on SCr of 0.6 mg/dL).    Allergies  Allergen Reactions  . Hydrocodone-Acetaminophen Nausea Only  . Morphine And Related Hives    Antimicrobials this admission: cefepime 8/22 >>   Dose adjustments this admission:  Microbiology results: 8/22 BCx: ngtd 8/22 UCx: sent  Previous cultures: 8/17: >100K E.coli (R amp, unasyn, gent, SMX/TMP)   Thank you for allowing pharmacy to be a part of this patient's care.  Lenis Noon, PharmD 09/27/2019 7:43 PM

## 2019-09-28 DIAGNOSIS — E860 Dehydration: Secondary | ICD-10-CM | POA: Diagnosis not present

## 2019-09-28 DIAGNOSIS — G893 Neoplasm related pain (acute) (chronic): Secondary | ICD-10-CM

## 2019-09-28 DIAGNOSIS — D72829 Elevated white blood cell count, unspecified: Secondary | ICD-10-CM | POA: Diagnosis not present

## 2019-09-28 DIAGNOSIS — R5381 Other malaise: Secondary | ICD-10-CM

## 2019-09-28 DIAGNOSIS — D638 Anemia in other chronic diseases classified elsewhere: Secondary | ICD-10-CM

## 2019-09-28 DIAGNOSIS — S21101D Unspecified open wound of right front wall of thorax without penetration into thoracic cavity, subsequent encounter: Secondary | ICD-10-CM | POA: Diagnosis not present

## 2019-09-28 DIAGNOSIS — A4150 Gram-negative sepsis, unspecified: Secondary | ICD-10-CM | POA: Diagnosis not present

## 2019-09-28 DIAGNOSIS — S21101A Unspecified open wound of right front wall of thorax without penetration into thoracic cavity, initial encounter: Secondary | ICD-10-CM

## 2019-09-28 DIAGNOSIS — E039 Hypothyroidism, unspecified: Secondary | ICD-10-CM | POA: Diagnosis not present

## 2019-09-28 DIAGNOSIS — N390 Urinary tract infection, site not specified: Secondary | ICD-10-CM | POA: Diagnosis not present

## 2019-09-28 DIAGNOSIS — C50919 Malignant neoplasm of unspecified site of unspecified female breast: Secondary | ICD-10-CM | POA: Diagnosis not present

## 2019-09-28 LAB — COMPREHENSIVE METABOLIC PANEL
ALT: 18 U/L (ref 0–44)
AST: 12 U/L — ABNORMAL LOW (ref 15–41)
Albumin: 2.3 g/dL — ABNORMAL LOW (ref 3.5–5.0)
Alkaline Phosphatase: 154 U/L — ABNORMAL HIGH (ref 38–126)
Anion gap: 11 (ref 5–15)
BUN: 8 mg/dL (ref 6–20)
CO2: 24 mmol/L (ref 22–32)
Calcium: 8.6 mg/dL — ABNORMAL LOW (ref 8.9–10.3)
Chloride: 101 mmol/L (ref 98–111)
Creatinine, Ser: 0.65 mg/dL (ref 0.44–1.00)
GFR calc Af Amer: >60 mL/min (ref >60)
GFR calc non Af Amer: >60 mL/min (ref >60)
Glucose, Bld: 106 mg/dL — ABNORMAL HIGH (ref 70–99)
Potassium: 4.1 mmol/L (ref 3.5–5.1)
Sodium: 136 mmol/L (ref 135–145)
Total Bilirubin: 0.4 mg/dL (ref 0.3–1.2)
Total Protein: 6.4 g/dL — ABNORMAL LOW (ref 6.5–8.1)

## 2019-09-28 LAB — URINE CULTURE: Culture: NO GROWTH

## 2019-09-28 LAB — CBC
HCT: 31 % — ABNORMAL LOW (ref 36.0–46.0)
Hemoglobin: 9.3 g/dL — ABNORMAL LOW (ref 12.0–15.0)
MCH: 28.7 pg (ref 26.0–34.0)
MCHC: 30 g/dL (ref 30.0–36.0)
MCV: 95.7 fL (ref 80.0–100.0)
Platelets: 391 10*3/uL (ref 150–400)
RBC: 3.24 MIL/uL — ABNORMAL LOW (ref 3.87–5.11)
RDW: 17.3 % — ABNORMAL HIGH (ref 11.5–15.5)
WBC: 15.6 10*3/uL — ABNORMAL HIGH (ref 4.0–10.5)
nRBC: 0 % (ref 0.0–0.2)

## 2019-09-28 LAB — PROTIME-INR
INR: 1.2 (ref 0.8–1.2)
Prothrombin Time: 14.5 seconds (ref 11.4–15.2)

## 2019-09-28 LAB — HIV ANTIBODY (ROUTINE TESTING W REFLEX): HIV Screen 4th Generation wRfx: NONREACTIVE

## 2019-09-28 MED ORDER — AQUAPHOR EX OINT
TOPICAL_OINTMENT | Freq: Every morning | CUTANEOUS | Status: DC
  Filled 2019-09-28: qty 50

## 2019-09-28 MED ORDER — SODIUM CHLORIDE 0.9 % IV SOLN: INTRAVENOUS | Status: AC

## 2019-09-28 MED ORDER — BUPROPION HCL ER (XL) 150 MG PO TB24
150.0000 mg | ORAL_TABLET | Freq: Every morning | ORAL | Status: DC
  Administered 2019-09-28 – 2019-10-02 (×5): 150 mg via ORAL
  Filled 2019-09-28 (×5): qty 1

## 2019-09-28 MED ORDER — SENNOSIDES-DOCUSATE SODIUM 8.6-50 MG PO TABS
1.0000 | ORAL_TABLET | Freq: Two times a day (BID) | ORAL | Status: DC
  Administered 2019-09-28 – 2019-10-02 (×8): 1 via ORAL
  Filled 2019-09-28 (×8): qty 1

## 2019-09-28 MED ORDER — GABAPENTIN 300 MG PO CAPS
300.0000 mg | ORAL_CAPSULE | Freq: Two times a day (BID) | ORAL | Status: DC | PRN
  Filled 2019-09-28: qty 1

## 2019-09-28 MED ORDER — PROSOURCE PLUS PO LIQD
30.0000 mL | Freq: Every day | ORAL | Status: DC
  Administered 2019-09-28 – 2019-10-01 (×2): 30 mL via ORAL
  Filled 2019-09-28 (×3): qty 30

## 2019-09-28 MED ORDER — PANTOPRAZOLE SODIUM 40 MG PO TBEC
40.0000 mg | DELAYED_RELEASE_TABLET | Freq: Every day | ORAL | Status: DC
  Administered 2019-09-28 – 2019-10-02 (×5): 40 mg via ORAL
  Filled 2019-09-28 (×5): qty 1

## 2019-09-28 MED ORDER — GABAPENTIN 300 MG PO CAPS
300.0000 mg | ORAL_CAPSULE | Freq: Three times a day (TID) | ORAL | Status: DC | PRN
  Administered 2019-09-28 – 2019-10-02 (×10): 300 mg via ORAL
  Filled 2019-09-28 (×10): qty 1

## 2019-09-28 MED ORDER — JUVEN PO PACK
1.0000 | PACK | Freq: Two times a day (BID) | ORAL | Status: DC
  Administered 2019-09-28 – 2019-10-01 (×4): 1 via ORAL
  Filled 2019-09-28 (×9): qty 1

## 2019-09-28 MED ORDER — LIDOCAINE-PRILOCAINE 2.5-2.5 % EX CREA
1.0000 "application " | TOPICAL_CREAM | Freq: Every day | CUTANEOUS | Status: DC | PRN
  Filled 2019-09-28: qty 5

## 2019-09-28 MED ORDER — ADULT MULTIVITAMIN W/MINERALS CH
1.0000 | ORAL_TABLET | Freq: Every day | ORAL | Status: DC
  Administered 2019-09-28 – 2019-10-01 (×4): 1 via ORAL
  Filled 2019-09-28 (×5): qty 1

## 2019-09-28 MED ORDER — KATE FARMS STANDARD 1.4 PO LIQD
325.0000 mL | Freq: Two times a day (BID) | ORAL | Status: DC
  Administered 2019-09-28 – 2019-10-01 (×3): 325 mL via ORAL
  Filled 2019-09-28 (×9): qty 325

## 2019-09-28 MED ORDER — HYDROCOD POLST-CPM POLST ER 10-8 MG/5ML PO SUER
5.0000 mL | Freq: Two times a day (BID) | ORAL | Status: DC | PRN
  Administered 2019-09-28 (×2): 5 mL via ORAL
  Filled 2019-09-28 (×2): qty 5

## 2019-09-28 MED ORDER — VANCOMYCIN HCL 750 MG/150ML IV SOLN
750.0000 mg | Freq: Two times a day (BID) | INTRAVENOUS | Status: DC
  Administered 2019-09-28 – 2019-09-30 (×4): 750 mg via INTRAVENOUS
  Filled 2019-09-28 (×4): qty 150

## 2019-09-28 MED ORDER — METHOCARBAMOL 500 MG PO TABS: 500.0000 mg | ORAL_TABLET | Freq: Four times a day (QID) | ORAL | Status: DC | PRN

## 2019-09-28 MED ORDER — DAKINS (1/4 STRENGTH) 0.125 % EX SOLN
Freq: Every morning | CUTANEOUS | Status: DC
  Filled 2019-09-28 (×2): qty 473

## 2019-09-28 MED ORDER — PHENAZOPYRIDINE HCL 200 MG PO TABS
200.0000 mg | ORAL_TABLET | Freq: Three times a day (TID) | ORAL | Status: DC
  Administered 2019-09-29 (×2): 200 mg via ORAL
  Filled 2019-09-28 (×8): qty 1

## 2019-09-28 MED ORDER — LEVOTHYROXINE SODIUM 75 MCG PO TABS
75.0000 ug | ORAL_TABLET | Freq: Every day | ORAL | Status: DC
  Administered 2019-09-28 – 2019-10-02 (×4): 75 ug via ORAL
  Filled 2019-09-28 (×4): qty 1

## 2019-09-28 MED ORDER — VANCOMYCIN HCL 1250 MG/250ML IV SOLN
1250.0000 mg | Freq: Once | INTRAVENOUS | Status: AC
  Administered 2019-09-28: 1250 mg via INTRAVENOUS
  Filled 2019-09-28: qty 250

## 2019-09-28 MED ORDER — LORAZEPAM 0.5 MG PO TABS: 0.5000 mg | ORAL_TABLET | Freq: Every evening | ORAL | Status: DC | PRN
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
## 2019-09-28 NOTE — Progress Notes (Signed)
Patient with a MEWs score of 3 due o HR and RR. MEWs protocol implemented. Has had elevated HR since admission. In no distress but c/o periods of sweating and coldness.
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
## 2019-09-28 NOTE — Progress Notes (Signed)
   09/28/19 1724  Assess: MEWS Score  Temp (!) 101.1 F (38.4 C)  BP 120/86  Pulse Rate (!) 122  Resp 18  SpO2 93 %  Assess: MEWS Score  MEWS Temp 1  MEWS Systolic 0  MEWS Pulse 2  MEWS RR 0  MEWS LOC 0  MEWS Score 3  MEWS Score Color Yellow  Assess: if the MEWS score is Yellow or Red  Were vital signs taken at a resting state? Yes  Focused Assessment No change from prior assessment  Early Detection of Sepsis Score *See Row Information* Low  MEWS guidelines implemented *See Row Information* No, previously yellow, continue vital signs every 4 hours  Treat  Complains of Fever  Interventions Medication (see MAR)  Notify: Provider  Provider Name/Title Irine Seal  Date Provider Notified 09/28/19  Time Provider Notified 1727  Notification Type Page  Notification Reason Other (Comment) (just FYI of elevated temp)  Response Other (Comment) (give Tylenol early)  Date of Provider Response 09/28/19  Time of Provider Response 1730
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
## 2019-09-28 NOTE — Progress Notes (Signed)
Pharmacy Antibiotic Note  Kara Pittman is a 54 y.o. female admitted on 09/27/2019 with probable UTI.  Pharmacy has been consulted for Cefepime dosing.  Previous Cx:  8/17: >100K E.coli (R amp, unasyn, gent, SMX/TMP)  Plan: Cefepime 2gm q8 for presumed UTI Vancomycin 1250mg  x1, then 750mg  q12 for chest wall wound     Temp (24hrs), Avg:98.4 F (36.9 C), Min:97.9 F (36.6 C), Max:98.7 F (37.1 C)  Recent Labs  Lab 09/27/19 1634 09/27/19 1717 09/27/19 2140 09/28/19 0254  WBC 17.0*  --   --  15.6*  CREATININE 0.60  --   --  0.65  LATICACIDVEN  --  1.6 1.0  --     Estimated Creatinine Clearance: 70.7 mL/min (by C-G formula based on SCr of 0.65 mg/dL).    Allergies  Allergen Reactions  . Hydrocodone-Acetaminophen Nausea Only  . Morphine And Related Hives    Antimicrobials this admission: 8/22 Cefepime >>  8/23 Vanc >>   Dose adjustments this admission:  Microbiology results: 8/22 BCx: ngtd 8/22 UCx: sent    Thank you for allowing pharmacy to be a part of this patient's care.  Minda Ditto PharmD 09/28/2019 9:57 AM

## 2019-09-28 NOTE — Progress Notes (Signed)
   09/28/19 0543  Assess: if the MEWS score is Yellow or Red  Were vital signs taken at a resting state? Yes  Focused Assessment No change from prior assessment  Early Detection of Sepsis Score *See Row Information* High (admitted for sepsis)  MEWS guidelines implemented *See Row Information* Yes  Take Vital Signs  Increase Vital Sign Frequency  Yellow: Q 2hr X 2 then Q 4hr X 2, if remains yellow, continue Q 4hrs  Escalate  MEWS: Escalate Yellow: discuss with charge nurse/RN and consider discussing with provider and RRT  Notify: Charge Nurse/RN  Name of Charge Nurse/RN Notified Sharyn Lull  Date Charge Nurse/RN Notified 09/28/19  Notify: Provider  Provider Name/Title Rufina Falco NP  Date Provider Notified 09/28/19  Time Provider Notified [phone]  Notification Type Page  Notification Reason Change in status  Response No new orders  Date of Provider Response 09/28/19  Time of Provider Response 0543  Notify: Rapid Response  Name of Rapid Response RN Notified  (n/a)  Date Rapid Response Notified  (n/a)  Time Rapid Response Notified  (n/a)  Document  Patient Outcome Other (Comment) (pt stable no need for transfer)
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
## 2019-09-29 DIAGNOSIS — C50919 Malignant neoplasm of unspecified site of unspecified female breast: Secondary | ICD-10-CM | POA: Diagnosis not present

## 2019-09-29 DIAGNOSIS — D72829 Elevated white blood cell count, unspecified: Secondary | ICD-10-CM | POA: Diagnosis not present

## 2019-09-29 DIAGNOSIS — E039 Hypothyroidism, unspecified: Secondary | ICD-10-CM | POA: Diagnosis not present

## 2019-09-29 DIAGNOSIS — R5381 Other malaise: Secondary | ICD-10-CM | POA: Diagnosis not present

## 2019-09-29 DIAGNOSIS — D638 Anemia in other chronic diseases classified elsewhere: Secondary | ICD-10-CM | POA: Diagnosis not present

## 2019-09-29 DIAGNOSIS — G893 Neoplasm related pain (acute) (chronic): Secondary | ICD-10-CM | POA: Diagnosis not present

## 2019-09-29 DIAGNOSIS — N390 Urinary tract infection, site not specified: Secondary | ICD-10-CM | POA: Diagnosis not present

## 2019-09-29 DIAGNOSIS — A4150 Gram-negative sepsis, unspecified: Secondary | ICD-10-CM | POA: Diagnosis not present

## 2019-09-29 DIAGNOSIS — S21101D Unspecified open wound of right front wall of thorax without penetration into thoracic cavity, subsequent encounter: Secondary | ICD-10-CM | POA: Diagnosis not present

## 2019-09-29 DIAGNOSIS — E860 Dehydration: Secondary | ICD-10-CM | POA: Diagnosis not present

## 2019-09-29 LAB — CBC WITH DIFFERENTIAL/PLATELET
Abs Immature Granulocytes: 0.1 10*3/uL — ABNORMAL HIGH (ref 0.00–0.07)
Basophils Absolute: 0.1 10*3/uL (ref 0.0–0.1)
Basophils Relative: 1 %
Eosinophils Absolute: 0.6 10*3/uL — ABNORMAL HIGH (ref 0.0–0.5)
Eosinophils Relative: 4 %
HCT: 30.7 % — ABNORMAL LOW (ref 36.0–46.0)
Hemoglobin: 9.3 g/dL — ABNORMAL LOW (ref 12.0–15.0)
Immature Granulocytes: 1 %
Lymphocytes Relative: 5 %
Lymphs Abs: 0.7 10*3/uL (ref 0.7–4.0)
MCH: 29 pg (ref 26.0–34.0)
MCHC: 30.3 g/dL (ref 30.0–36.0)
MCV: 95.6 fL (ref 80.0–100.0)
Monocytes Absolute: 0.8 10*3/uL (ref 0.1–1.0)
Monocytes Relative: 6 %
Neutro Abs: 11.9 10*3/uL — ABNORMAL HIGH (ref 1.7–7.7)
Neutrophils Relative %: 83 %
Platelets: 384 10*3/uL (ref 150–400)
RBC: 3.21 MIL/uL — ABNORMAL LOW (ref 3.87–5.11)
RDW: 16.9 % — ABNORMAL HIGH (ref 11.5–15.5)
WBC: 14.1 10*3/uL — ABNORMAL HIGH (ref 4.0–10.5)
nRBC: 0 % (ref 0.0–0.2)

## 2019-09-29 LAB — BASIC METABOLIC PANEL
Anion gap: 11 (ref 5–15)
BUN: 12 mg/dL (ref 6–20)
CO2: 24 mmol/L (ref 22–32)
Calcium: 8.4 mg/dL — ABNORMAL LOW (ref 8.9–10.3)
Chloride: 102 mmol/L (ref 98–111)
Creatinine, Ser: 0.57 mg/dL (ref 0.44–1.00)
GFR calc Af Amer: >60 mL/min (ref >60)
GFR calc non Af Amer: >60 mL/min (ref >60)
Glucose, Bld: 96 mg/dL (ref 70–99)
Potassium: 3.4 mmol/L — ABNORMAL LOW (ref 3.5–5.1)
Sodium: 137 mmol/L (ref 135–145)

## 2019-09-29 LAB — MAGNESIUM: Magnesium: 2.1 mg/dL (ref 1.7–2.4)

## 2019-09-29 MED ORDER — POTASSIUM CHLORIDE CRYS ER 20 MEQ PO TBCR
40.0000 meq | EXTENDED_RELEASE_TABLET | Freq: Once | ORAL | Status: AC
  Administered 2019-09-29: 40 meq via ORAL
  Filled 2019-09-29: qty 2

## 2019-09-29 MED ORDER — KETOROLAC TROMETHAMINE 30 MG/ML IJ SOLN
30.0000 mg | Freq: Three times a day (TID) | INTRAMUSCULAR | Status: DC | PRN
  Administered 2019-09-29: 30 mg via INTRAVENOUS
  Filled 2019-09-29: qty 1

## 2019-09-29 MED ORDER — ALTEPLASE 2 MG IJ SOLR
2.0000 mg | Freq: Once | INTRAMUSCULAR | Status: AC
  Administered 2019-09-29: 2 mg
  Filled 2019-09-29: qty 2

## 2019-09-29 MED ORDER — PROCHLORPERAZINE EDISYLATE 10 MG/2ML IJ SOLN
10.0000 mg | Freq: Four times a day (QID) | INTRAMUSCULAR | Status: DC | PRN
  Administered 2019-09-29: 10 mg via INTRAVENOUS
  Filled 2019-09-29 (×2): qty 2

## 2019-09-29 MED ORDER — SODIUM CHLORIDE 0.9 % IV SOLN: INTRAVENOUS | Status: DC

## 2019-09-29 MED ORDER — STERILE WATER FOR INJECTION IJ SOLN
INTRAMUSCULAR | Status: AC
  Filled 2019-09-29: qty 10

## 2019-09-29 MED FILL — oxyCODONE HCL 10 MG TABS: 10 | 15 days supply | Qty: 120 | Fill #0

## 2019-09-29 NOTE — Plan of Care (Signed)
  Problem: Education: Goal: Knowledge of General Education information will improve Description: Including pain rating scale, medication(s)/side effects and non-pharmacologic comfort measures Outcome: Progressing   Problem: Pain Managment: Goal: General experience of comfort will improve Outcome: Progressing   
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
## 2019-09-30 DIAGNOSIS — R5381 Other malaise: Secondary | ICD-10-CM | POA: Diagnosis not present

## 2019-09-30 DIAGNOSIS — R509 Fever, unspecified: Secondary | ICD-10-CM

## 2019-09-30 DIAGNOSIS — S21101D Unspecified open wound of right front wall of thorax without penetration into thoracic cavity, subsequent encounter: Secondary | ICD-10-CM | POA: Diagnosis not present

## 2019-09-30 DIAGNOSIS — G893 Neoplasm related pain (acute) (chronic): Secondary | ICD-10-CM | POA: Diagnosis not present

## 2019-09-30 DIAGNOSIS — D72829 Elevated white blood cell count, unspecified: Secondary | ICD-10-CM | POA: Diagnosis not present

## 2019-09-30 LAB — CBC WITH DIFFERENTIAL/PLATELET
Abs Immature Granulocytes: 0.14 10*3/uL — ABNORMAL HIGH (ref 0.00–0.07)
Basophils Absolute: 0.1 10*3/uL (ref 0.0–0.1)
Basophils Relative: 0 %
Eosinophils Absolute: 0.9 10*3/uL — ABNORMAL HIGH (ref 0.0–0.5)
Eosinophils Relative: 6 %
HCT: 30.1 % — ABNORMAL LOW (ref 36.0–46.0)
Hemoglobin: 8.7 g/dL — ABNORMAL LOW (ref 12.0–15.0)
Immature Granulocytes: 1 %
Lymphocytes Relative: 5 %
Lymphs Abs: 0.8 10*3/uL (ref 0.7–4.0)
MCH: 27.7 pg (ref 26.0–34.0)
MCHC: 28.9 g/dL — ABNORMAL LOW (ref 30.0–36.0)
MCV: 95.9 fL (ref 80.0–100.0)
Monocytes Absolute: 0.8 10*3/uL (ref 0.1–1.0)
Monocytes Relative: 5 %
Neutro Abs: 13.2 10*3/uL — ABNORMAL HIGH (ref 1.7–7.7)
Neutrophils Relative %: 83 %
Platelets: 428 10*3/uL — ABNORMAL HIGH (ref 150–400)
RBC: 3.14 MIL/uL — ABNORMAL LOW (ref 3.87–5.11)
RDW: 17.3 % — ABNORMAL HIGH (ref 11.5–15.5)
WBC: 15.9 10*3/uL — ABNORMAL HIGH (ref 4.0–10.5)
nRBC: 0 % (ref 0.0–0.2)

## 2019-09-30 LAB — BASIC METABOLIC PANEL
Anion gap: 11 (ref 5–15)
BUN: 12 mg/dL (ref 6–20)
CO2: 22 mmol/L (ref 22–32)
Calcium: 8 mg/dL — ABNORMAL LOW (ref 8.9–10.3)
Chloride: 103 mmol/L (ref 98–111)
Creatinine, Ser: 0.61 mg/dL (ref 0.44–1.00)
GFR calc Af Amer: >60 mL/min (ref >60)
GFR calc non Af Amer: >60 mL/min (ref >60)
Glucose, Bld: 105 mg/dL — ABNORMAL HIGH (ref 70–99)
Potassium: 3.9 mmol/L (ref 3.5–5.1)
Sodium: 136 mmol/L (ref 135–145)

## 2019-09-30 LAB — MAGNESIUM: Magnesium: 2.1 mg/dL (ref 1.7–2.4)

## 2019-09-30 MED ORDER — SODIUM CHLORIDE 0.9 % IV SOLN: INTRAVENOUS | Status: DC

## 2019-09-30 NOTE — Progress Notes (Signed)
Phoned [place] spoke with [place], Therapist, sports. Explained the verbal order has been entered for the wound culture she assisted Dr. Sondra Come in collecting. She verbalized understanding and committed to getting the specimen to the lab.

## 2019-09-30 NOTE — Consult Note (Signed)
[facility]       Reason for Consult: fever    Referring Physician: Dr. Algis Liming  Active Problems:   Sepsis due to gram-negative UTI ([place])   Stage IV breast cancer in female [facility])   Open wound of right chest wall   Dehydration   Cancer related pain   Leukocytosis   Anemia of chronic disease   Debility   . (feeding supplement) PROSource Plus  30 mL Oral Daily  . acetaminophen  1,000 mg Oral Q8H  . buPROPion  150 mg Oral q AM  . Chlorhexidine Gluconate Cloth  6 each Topical Daily  . enoxaparin (LOVENOX) injection  40 mg Subcutaneous Q24H  . feeding supplement (KATE FARMS STANDARD 1.4)  325 mL Oral BID BM  . levothyroxine  75 mcg Oral QAC breakfast  . multivitamin with minerals  1 tablet Oral Daily  . nutrition supplement (JUVEN)  1 packet Oral BID BM  . oxyCODONE  20 mg Oral Q12H  . pantoprazole  40 mg Oral Daily  . phenazopyridine  200 mg Oral TID WC  . senna-docusate  1 tablet Oral BID  . 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  UA negative for infection, negative blood cultures, feels stable overall. She has had an ongoing fever and evaluated recently at [facility] as well with no signs of infection.  I agree with the assessment at [facility] that this is most c/w tumor fever.  Certainly she is at risk for infection but no concerning signs.  Will stop antibiotics and observe.   Wound culture sent of her chest area but no signs of infection so results would not prompt treatment with antibiotics.     Antibiotics: Vancomycin and cefepime  HPI: Kara Pittman is a 54 y.o. female with metastatic breast cancer with recent metastatic disease on new immunotherapy at [place] who came in 8/22 with nausea, poor po intake, fever, dysuria.  Work up with unremarkable with  negative blood cultures, UA without WBCs, some leukocytosis. Started on empiric antibiotics and observed.  Tmax 101.1.  No mouth sores or ulcers, no rash, no diarrhea.  No sob.  Was in [place] hospitalized with similar findings and treated for a UTI there with broad spectrum antibiotics though UA and work up unrevealing there as well. She overall feels stable and no current concerns.   Review of Systems:  Constitutional: positive for mild fevers, similar to previous, sweats Ears, nose, mouth, throat, and face: negative for sore mouth and sore throat Respiratory: positive for cough, negative for sputum, hemoptysis or pleurisy/chest pain Gastrointestinal: negative for nausea and diarrhea All other systems reviewed and are negative    Past Medical History:  Diagnosis Date  . Arthritis   . Asthma    as a child  . Cancer ([place])    Breast ; melonoma - Back and right ear  . Complication of anesthesia   . Depression    situational  . Family history of leukemia   . Family history of lung cancer   . Family history of skin cancer   . Family history of throat cancer   . Headache    migraine - decrease number  . Hypothyroidism   . Personal history of chemotherapy    Started 12/30/18 and currently still having treatments  . PONV (  postoperative nausea and vomiting)    Violently sick    Social History   Tobacco Use  . Smoking status: Never Smoker  . Smokeless tobacco: Never Used  Vaping Use  . Vaping Use: Never used  Substance Use Topics  . Alcohol use: Yes    Comment: occassionally  . Drug use: Never    Family History  Problem Relation Age of Onset  . Congestive Heart Failure Mother   . Skin cancer Mother        non-melanoma  . Heart disease Sister   . Heart attack Brother 15  . Heart disease Other   . Skin cancer Father        non-melanoma  . Leukemia Maternal Uncle 46  . Throat cancer Maternal Uncle        diagnosed late 65s  . Melanoma Paternal Aunt   . Cancer Paternal Aunt         salivary gland  . Skin cancer Paternal Uncle        non-melanoma  . Lung cancer Other        paternal great-uncle    Allergies  Allergen Reactions  . Hydrocodone-Acetaminophen Nausea Only  . Morphine And Related Hives    Physical Exam: Constitutional: in no apparent distress  Vitals:   09/30/19 0446 09/30/19 1300  BP: 108/76 117/68  Pulse: (!) 106 (!) 107  Resp: 16 16  Temp: 98.6 F (37 C) 99.2 F (37.3 C)  SpO2: 97% 97%   EYES: anicteric Cardiovascular: Cor RRR Respiratory: clear GI: soft Musculoskeletal: no pedal edema noted Skin: negatives: no rash Neuro: non-focal   Lab Results  Component Value Date   WBC 15.9 (H) 09/30/2019   HGB 8.7 (L) 09/30/2019   HCT 30.1 (L) 09/30/2019   MCV 95.9 09/30/2019   PLT 428 (H) 09/30/2019    Lab Results  Component Value Date   CREATININE 0.61 09/30/2019   BUN 12 09/30/2019   NA 136 09/30/2019   K 3.9 09/30/2019   CL 103 09/30/2019   CO2 22 09/30/2019    Lab Results  Component Value Date   ALT 18 09/28/2019   AST 12 (L) 09/28/2019   ALKPHOS 154 (H) 09/28/2019     Microbiology: Recent Results (from the past 240 hour(s))  Urine culture     Status: Abnormal   Collection Time: 09/22/19  4:10 PM   Specimen: Urine, Clean Catch  Result Value Ref Range Status   Specimen Description   Final    URINE, CLEAN CATCH Performed at [facility], [place] [address], [place], [place] [address]    Special Requests   Final    NONE Performed at [facility], [place] [address] [place], [place] [address]    Culture >=100,000 COLONIES/mL ESCHERICHIA COLI (A)  Final   Report Status 09/24/2019 FINAL  Final   Organism ID, Bacteria ESCHERICHIA COLI (A)  Final      Susceptibility   Escherichia coli - MIC*    AMPICILLIN >=32 RESISTANT Resistant     CEFAZOLIN <=4 SENSITIVE Sensitive     CEFTRIAXONE <=0.25 SENSITIVE Sensitive     CIPROFLOXACIN <=0.25 SENSITIVE Sensitive      GENTAMICIN >=16 RESISTANT Resistant     IMIPENEM <=0.25 SENSITIVE Sensitive     NITROFURANTOIN <=16 SENSITIVE Sensitive     TRIMETH/SULFA >=320 RESISTANT Resistant     AMPICILLIN/SULBACTAM >=32 RESISTANT Resistant     PIP/TAZO <=4 SENSITIVE Sensitive     * >=100,000  COLONIES/mL ESCHERICHIA COLI  SARS Coronavirus 2 by RT PCR (hospital order, performed in [facility] hospital lab) Nasopharyngeal Nasopharyngeal Swab     Status: None   Collection Time: 09/27/19  2:05 PM   Specimen: Nasopharyngeal Swab  Result Value Ref Range Status   SARS Coronavirus 2 NEGATIVE NEGATIVE Final    Comment: (NOTE) SARS-CoV-2 target nucleic acids are NOT DETECTED.  The SARS-CoV-2 RNA is generally detectable in upper and lower respiratory specimens during the acute phase of infection. The lowest concentration of SARS-CoV-2 viral copies this assay can detect is 250 copies / mL. A negative result does not preclude SARS-CoV-2 infection and should not be used as the sole basis for treatment or other patient management decisions.  A negative result may occur with improper specimen collection / handling, submission of specimen other than nasopharyngeal swab, presence of viral mutation(s) within the areas targeted by this assay, and inadequate number of viral copies (<250 copies / mL). A negative result must be combined with clinical observations, patient history, and epidemiological information.  Fact Sheet for Patients:   StrictlyIdeas.no  Fact Sheet for Healthcare Providers: BankingDealers.co.za  This test is not yet approved or  cleared by the [place] FDA and has been authorized for detection and/or diagnosis of SARS-CoV-2 by FDA under an Emergency Use Authorization (EUA).  This EUA will remain in effect (meaning this test can be used) for the duration of the COVID-19 declaration under Section 564(b)(1) of the Act, 21 U.S.C. section 360bbb-3(b)(1), unless  the authorization is terminated or revoked sooner.  Performed at [facility], [place] [address], [place], [place] [address]   Culture, Urine     Status: None   Collection Time: 09/27/19  4:34 PM   Specimen: Urine, Catheterized  Result Value Ref Range Status   Specimen Description   Final    URINE, CATHETERIZED Performed at [place] [address] [place], [place] [address]    Special Requests   Final    NONE Performed at [facility], [place] [address] [place], [place] [address]    Culture   Final    NO GROWTH Performed at [facility], [place] [address], [place], [place] [address]    Report Status 09/28/2019 FINAL  Final  Culture, blood (routine x 2)     Status: None (Preliminary result)   Collection Time: 09/27/19  5:48 PM   Specimen: BLOOD LEFT HAND  Result Value Ref Range Status   Specimen Description   Final    BLOOD LEFT HAND Performed at [facility], [place] [address], [place], [place] [address]    Special Requests   Final    BOTTLES DRAWN AEROBIC ONLY Blood Culture adequate volume Performed at [place] [address], [place], [place] [address]    Culture   Final    NO GROWTH 2 DAYS Performed at [place] [address], [place], [place] [address]    Report Status PENDING  Incomplete  Culture, blood (routine x 2)     Status: None (Preliminary result)   Collection Time: 09/27/19  5:53 PM   Specimen: BLOOD  Result Value Ref Range Status   Specimen Description   Final    BLOOD LEFT ANTECUBITAL Performed at [facility], [place] [address] [place], [place] [address]    Special Requests   Final    BOTTLES DRAWN AEROBIC ONLY Blood Culture adequate volume Performed at [facility]  [place] [address], [place], [place] [address]    Culture   Final    NO GROWTH 2 DAYS Performed at [place] [address] [place], [place] [address]     Report Status PENDING  Incomplete    Thayer Headings, [place] for Infectious Disease [place] [email or web address] 09/30/2019, 4:05 PM
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
## 2019-10-01 DIAGNOSIS — S21101D Unspecified open wound of right front wall of thorax without penetration into thoracic cavity, subsequent encounter: Secondary | ICD-10-CM | POA: Diagnosis not present

## 2019-10-01 DIAGNOSIS — R5381 Other malaise: Secondary | ICD-10-CM | POA: Diagnosis not present

## 2019-10-01 DIAGNOSIS — D72829 Elevated white blood cell count, unspecified: Secondary | ICD-10-CM | POA: Diagnosis not present

## 2019-10-01 DIAGNOSIS — G893 Neoplasm related pain (acute) (chronic): Secondary | ICD-10-CM | POA: Diagnosis not present

## 2019-10-01 DIAGNOSIS — R509 Fever, unspecified: Secondary | ICD-10-CM | POA: Diagnosis not present

## 2019-10-01 LAB — CBC
HCT: 28.5 % — ABNORMAL LOW (ref 36.0–46.0)
Hemoglobin: 8.6 g/dL — ABNORMAL LOW (ref 12.0–15.0)
MCH: 29.1 pg (ref 26.0–34.0)
MCHC: 30.2 g/dL (ref 30.0–36.0)
MCV: 96.3 fL (ref 80.0–100.0)
Platelets: 496 10*3/uL — ABNORMAL HIGH (ref 150–400)
RBC: 2.96 MIL/uL — ABNORMAL LOW (ref 3.87–5.11)
RDW: 17.1 % — ABNORMAL HIGH (ref 11.5–15.5)
WBC: 11.7 10*3/uL — ABNORMAL HIGH (ref 4.0–10.5)
nRBC: 0 % (ref 0.0–0.2)

## 2019-10-01 MED ORDER — HEPARIN SOD (PORK) LOCK FLUSH 100 UNIT/ML IV SOLN
500.0000 [IU] | INTRAVENOUS | Status: AC | PRN
  Administered 2019-10-02: 500 [IU]
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
## 2019-10-01 NOTE — Consult Note (Signed)
WOC consult requested for right chest wound,  This was performed on 8/23; refer to progress notes.  Topical treatment orders have been provided for bedside nurses to perform as follows: Cleanse wound with NS each time and pat dry. Apply Dakin's moistened gauze to right chest wound Q day, then cover with ABD pads and hold in place with a "tank top" made with mesh underwear with the crotch cut out to create a tube. Pt can resume follow-up with [facility] after discharge. Please re-consult if further assistance is needed.  Thank-you,  Julien Girt MSN, [place], [place], [place], [place]

## 2019-10-01 NOTE — Plan of Care (Signed)
  Problem: Activity: Goal: Risk for activity intolerance will decrease Outcome: Progressing   Problem: Elimination: Goal: Will not experience complications related to bowel motility Outcome: Progressing Goal: Will not experience complications related to urinary retention Outcome: Progressing   Problem: Pain Managment: Goal: General experience of comfort will improve Outcome: Progressing   
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
## 2019-10-02 DIAGNOSIS — S21101D Unspecified open wound of right front wall of thorax without penetration into thoracic cavity, subsequent encounter: Secondary | ICD-10-CM | POA: Diagnosis not present

## 2019-10-02 DIAGNOSIS — R509 Fever, unspecified: Secondary | ICD-10-CM | POA: Diagnosis not present

## 2019-10-02 DIAGNOSIS — G893 Neoplasm related pain (acute) (chronic): Secondary | ICD-10-CM | POA: Diagnosis not present

## 2019-10-02 DIAGNOSIS — R5381 Other malaise: Secondary | ICD-10-CM | POA: Diagnosis not present

## 2019-10-02 DIAGNOSIS — D72829 Elevated white blood cell count, unspecified: Secondary | ICD-10-CM | POA: Diagnosis not present

## 2019-10-02 LAB — CBC
HCT: 28.7 % — ABNORMAL LOW (ref 36.0–46.0)
Hemoglobin: 8.5 g/dL — ABNORMAL LOW (ref 12.0–15.0)
MCH: 28.3 pg (ref 26.0–34.0)
MCHC: 29.6 g/dL — ABNORMAL LOW (ref 30.0–36.0)
MCV: 95.7 fL (ref 80.0–100.0)
Platelets: 386 10*3/uL (ref 150–400)
RBC: 3 MIL/uL — ABNORMAL LOW (ref 3.87–5.11)
RDW: 17.2 % — ABNORMAL HIGH (ref 11.5–15.5)
WBC: 10.6 10*3/uL — ABNORMAL HIGH (ref 4.0–10.5)
nRBC: 0 % (ref 0.0–0.2)

## 2019-10-02 MED ORDER — DAKINS (1/4 STRENGTH) 0.125 % EX SOLN: Freq: Every morning | CUTANEOUS | 0 refills | Status: DC

## 2019-10-02 MED ORDER — JUVEN PO PACK: 1.0000 | PACK | Freq: Two times a day (BID) | ORAL | 0 refills | Status: DC

## 2019-10-02 MED ORDER — PROSOURCE PLUS PO LIQD: 30.0000 mL | Freq: Every day | ORAL | 0 refills | Status: DC

## 2019-10-02 MED ORDER — KATE FARMS STANDARD 1.4 PO LIQD: 325.0000 mL | Freq: Two times a day (BID) | ORAL | 20 refills | Status: DC

## 2019-10-02 MED FILL — DAKIN'S 0.125% SOLUTION: 0.125 | 30 days supply | Qty: 473 | Fill #0
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
## 2019-10-02 NOTE — Discharge Instructions (Signed)

## 2019-10-03 LAB — CULTURE, BLOOD (ROUTINE X 2)
Culture: NO GROWTH
Culture: NO GROWTH
Special Requests: ADEQUATE
Special Requests: ADEQUATE

## 2019-10-03 LAB — AEROBIC CULTURE W GRAM STAIN (SUPERFICIAL SPECIMEN): Culture: NORMAL

## 2019-10-05 ENCOUNTER — Telehealth: Payer: Self-pay | Admitting: *Deleted

## 2019-10-05 ENCOUNTER — Other Ambulatory Visit (HOSPITAL_COMMUNITY): Payer: Self-pay | Admitting: Internal Medicine

## 2019-10-05 DIAGNOSIS — Z171 Estrogen receptor negative status [ER-]: Secondary | ICD-10-CM | POA: Diagnosis not present

## 2019-10-05 DIAGNOSIS — N390 Urinary tract infection, site not specified: Secondary | ICD-10-CM

## 2019-10-05 DIAGNOSIS — A4150 Gram-negative sepsis, unspecified: Secondary | ICD-10-CM

## 2019-10-05 DIAGNOSIS — C50811 Malignant neoplasm of overlapping sites of right female breast: Secondary | ICD-10-CM | POA: Diagnosis not present

## 2019-10-05 MED FILL — oxyCODONE HCL 5 MG TABS: 5 | 20 days supply | Qty: 120 | Fill #0

## 2019-10-05 MED FILL — LIDOCAINE-PRILOCAINE 2.5-2.: 2.5-2.5 | 14 days supply | Qty: 30 | Fill #0

## 2019-10-05 MED FILL — MIRTAZAPINE 15 MG TABS: 15 | 30 days supply | Qty: 30 | Fill #0

## 2019-10-05 MED FILL — LORazepam 0.5 MG TABS: 0.5 | 30 days supply | Qty: 30 | Fill #0

## 2019-10-05 MED FILL — PROCHLORPERAZINE 10 MG TAB: 10 | 7 days supply | Qty: 30 | Fill #0

## 2019-10-05 NOTE — Telephone Encounter (Signed)
.  Email sent to [facility] to request a follow up appointment. Kara Pittman    PEC [phone]

## 2019-10-05 NOTE — Telephone Encounter (Signed)
Patient discharged from [facility] on 10/01/19. Order placed for [place] for transition of care assessment completion, [place] Coordinator MM for care coordination related to Cancer treatment.  melan Lenor Coffin, RN, BSN, [place] [facility] [place] [phone]

## 2019-10-07 ENCOUNTER — Other Ambulatory Visit: Payer: Self-pay | Admitting: *Deleted

## 2019-10-07 ENCOUNTER — Ambulatory Visit: Payer: Self-pay

## 2019-10-07 NOTE — Patient Instructions (Signed)
Kara Pittman - I hope this correspondence finds you doing better after your recent hospitalization. As a benefit of your Medicaid, you have access to a care management team consisting of a nurse case manager, social work team, and pharmacy services at no cost to you.   I tried reaching you by phone today and was unsuccessful but left a message for you with my direct contact number.   Either I or one of my nurse case manager colleagues will to try to reach you again in the coming days.   Please do not hesitate to reach out if I can be of assistance.   [employer] Management Coordinator Direct Dial:  [phone]  Fax: [phone]

## 2019-10-07 NOTE — Patient Outreach (Signed)
Care Coordination  10/07/2019  Kara Pittman November 14, 1965 [id number]   Unable to reach patient for [facility] call after her recent discharge from [facility] after admission for treatment of presumed sepsis through to be due to a UTI, subsequent ED visit with evaluation and discharge to home with home health and oral antibiotics, and finally subsequent re-admission for treatment of UTI symptoms and failure to thrive. After extensive work-up, ID consultation, it was felt that persistent fever was related to her metastatic cancer, oral antibiotics were discontinued 8/25, and the wound care team evaluated patient and provided recommendations and instructions tso the patient regarding wound care at home. The patient was discharged on 10/02/19.   Plan: The High Risk Managed Medicaid Team will follow up with Kara Pittman for transition of care, care coordination, and care management needs.   [place] [employer] Management Coordinator Direct Dial:  [phone]  Fax: [phone]

## 2019-10-08 ENCOUNTER — Ambulatory Visit: Payer: Self-pay

## 2019-10-09 ENCOUNTER — Ambulatory Visit: Payer: Self-pay | Admitting: Obstetrics and Gynecology

## 2019-10-14 ENCOUNTER — Other Ambulatory Visit: Payer: Self-pay | Admitting: Family Medicine

## 2019-10-17 ENCOUNTER — Other Ambulatory Visit: Payer: Self-pay | Admitting: Family Medicine

## 2019-10-17 DIAGNOSIS — E039 Hypothyroidism, unspecified: Secondary | ICD-10-CM

## 2019-10-19 ENCOUNTER — Other Ambulatory Visit: Payer: Self-pay | Admitting: Family Medicine

## 2019-10-19 MED FILL — SYNTHROID 75 MCG TABLET: 75 | 90 days supply | Qty: 90 | Fill #0

## 2019-10-22 ENCOUNTER — Other Ambulatory Visit: Payer: Self-pay

## 2019-10-22 NOTE — Patient Outreach (Signed)
Care Coordination  10/22/2019  Kara Pittman 06/27/1965 [id number]  A second unsuccessful telephone outreach was attempted today. The patient was referred to the case management team for assistance with care management and care coordination.    Follow Up Plan: The Managed Medicaid care management team will reach out to the patient again over the next 14 days.   Aida Raider RN, BSN [place]  [employer] Curator - Managed Medicaid High Risk [phone]

## 2019-10-23 DIAGNOSIS — T2101XA Burn of unspecified degree of chest wall, initial encounter: Secondary | ICD-10-CM | POA: Diagnosis not present

## 2019-10-26 DIAGNOSIS — Z5111 Encounter for antineoplastic chemotherapy: Secondary | ICD-10-CM | POA: Diagnosis not present

## 2019-10-26 DIAGNOSIS — C50919 Malignant neoplasm of unspecified site of unspecified female breast: Secondary | ICD-10-CM | POA: Diagnosis not present

## 2019-10-26 DIAGNOSIS — C50811 Malignant neoplasm of overlapping sites of right female breast: Secondary | ICD-10-CM | POA: Diagnosis not present

## 2019-10-26 DIAGNOSIS — Z171 Estrogen receptor negative status [ER-]: Secondary | ICD-10-CM | POA: Diagnosis not present

## 2019-11-02 ENCOUNTER — Other Ambulatory Visit: Payer: Self-pay

## 2019-11-02 ENCOUNTER — Encounter: Payer: Self-pay | Admitting: Obstetrics and Gynecology

## 2019-11-02 ENCOUNTER — Other Ambulatory Visit: Payer: Self-pay | Admitting: Obstetrics and Gynecology

## 2019-11-02 NOTE — Patient Instructions (Signed)
Kara Pittman,  Thank you for speaking to me today.  Please see information below regarding your current plan contact information and care plan.   Kara Pittman was given information about Medicaid Managed Care team care coordination services as a part of their Healthy [facility] Medicaid benefit. Kara Pittman verbally consented to engagement with the [facility] Managed Care team.   For questions related to your Healthy [facility] health plan, please call: [phone] or visit the homepage here: [email or web address]  If you would like to schedule transportation through your Healthy [facility] plan, please call the following number at least 2 days in advance of your appointment: [phone]  Goals Addressed              This Visit's Progress   .  "I need help with dressing supplies" (pt-stated)        CARE PLAN ENTRY Medicaid Managed Care (see longitudinal plan of care for additional care plan information)  Current Barriers:  . Care Coordination needs related to dressing supplies, need help with obtaining dressing supplies.   Nurse Case Manager Clinical Goal(s):  Marland Kitchen Over the next 7 days, patient will verbalize understanding of plan for obtaining needed supplies for dressing changes. Interventions:  . Inter-disciplinary care team collaboration (see longitudinal plan of care) . Evaluation of current treatment plan related to cancer treatment/wound care/dressing changes  and patient's adherence to plan as established by provider. . Advised patient to contact [employer] (plan) for questions related to management of health care benefits. . Provided education to patient re: securing dressing supplies. Kara Pittman with [employer] case manager regarding benefits and coverage for supplies.  Plan:  . Patient will contact [employer] for any questions. Marland Kitchen RNCM will contact [employer] case manager and follow up with patient.   Initial goal  documentation        The Managed Medicaid care management team will reach out to the patient again over the next 30 days.   Aida Raider RN, BSN [place]  Triad Curator - Managed Medicaid High Risk [phone]

## 2019-11-02 NOTE — Patient Outreach (Signed)
Care Coordination- Social Work  11/02/2019  Kara Pittman 1965-06-12 [id number]  Subjective:    Kara Pittman is an 54 y.o. year old female who is a primary patient of Hensel, Jamal Collin, MD.    Kara Pittman was given information about Medicaid Managed Care team care coordination services today. Kara Pittman agreed to services and verbal consent obtained  Review of patient status, laboratory and other test data was performed as part of evaluation for provision of services.  SDOH:   SDOH Screenings   Alcohol Screen:   . Last Alcohol Screening Score (AUDIT): Not on file  Depression (PHQ2-9): Low Risk   . PHQ-2 Score: 0  Financial Resource Strain:   . Difficulty of Paying Living Expenses: Not on file  Food Insecurity:   . Worried About Charity fundraiser in the Last Year: Not on file  . Ran Out of Food in the Last Year: Not on file  Housing:   . Last Housing Risk Score: Not on file  Physical Activity:   . Days of Exercise per Week: Not on file  . Minutes of Exercise per Session: Not on file  Social Connections:   . Frequency of Communication with Friends and Family: Not on file  . Frequency of Social Gatherings with Friends and Family: Not on file  . Attends Religious Services: Not on file  . Active Member of Clubs or Organizations: Not on file  . Attends Archivist Meetings: Not on file  . Marital Status: Not on file  Stress:   . Feeling of Stress : Not on file  Tobacco Use: Low Risk   . Smoking Tobacco Use: Never Smoker  . Smokeless Tobacco Use: Never Used  Transportation Needs: No Transportation Needs  . Lack of Transportation (Medical): No  . Lack of Transportation (Non-Medical): No     Objective:    Medications:  Medications Reviewed Today    Reviewed by Gayla Medicus, RN (Registered Nurse) on 11/02/19 at 1127  Med List Status: <None>  Medication Order Taking? Sig Documenting Provider Last Dose Status Informant  acetaminophen (TYLENOL) 500 MG  tablet [id number] Yes Take 500-1,000 mg by mouth every 6 (six) hours as needed for mild pain or headache. [provider] Taking Active Self  buPROPion (WELLBUTRIN XL) 150 MG 24 hr tablet [id number] No Take 1 tablet by mouth daily  Patient not taking: Reported on 11/02/2019   [name], MD Not Taking Active Self  CALCIUM PO [id number]  Take 1 tablet by mouth daily as needed (low calcium).  [provider]  Active Self  chlorpheniramine-HYDROcodone (TUSSIONEX) 10-8 MG/5ML SUER [id number] No Take 5 mLs by mouth every 12 (twelve) hours as needed for cough.  Patient not taking: Reported on 11/02/2019   [name], MD Not Taking Active Self  docusate sodium (COLACE) 100 MG capsule [id number] No Take 1 capsule (100 mg total) by mouth 2 (two) times daily.  Patient not taking: Reported on 11/02/2019   [name], MD Not Taking Active Self  gabapentin (NEURONTIN) 300 MG capsule [id number] No Take 1 capsule (300 mg total) by mouth at bedtime.  Patient not taking: Reported on 11/02/2019   [name], MD Not Taking Active Self  ibuprofen (ADVIL) 200 MG tablet [id number] Yes Take 200 mg by mouth every 6 (six) hours as needed for moderate pain. [provider] Taking Active Self  lidocaine-prilocaine (EMLA) cream [id number] No Apply to affected area once  Patient not taking: Reported on  11/02/2019   [name], MD Not Taking Active Self           Med Note Glennon Hamilton, THU LE   Wed Jan 28, 2019  4:25 PM)    LORazepam (ATIVAN) 0.5 MG tablet [id number] No Take 1 tablet (0.5 mg total) by mouth at bedtime as needed for sleep.  Patient not taking: Reported on 11/02/2019   [name], MD Not Taking Active Self  methocarbamol (ROBAXIN) 500 MG tablet [id number] No Take 1 tablet (500 mg total) by mouth every 6 (six) hours as needed for muscle spasms.  Patient not taking: Reported on 11/02/2019   [name], MD Not Taking Active Self  Multiple Vitamins-Calcium (ONE-A-DAY WOMENS  FORMULA) TABS [id number] No Take 1 tablet by mouth daily as needed (nutrition).   Patient not taking: Reported on 11/02/2019   [provider] Not Taking Active Self  nutrition supplement, Leanord Asal) PACK [id number]  Take 1 packet by mouth 2 (two) times daily between meals. Modena Jansky, MD  Active   Nutritional Supplements (,FEEDING SUPPLEMENT, PROSOURCE PLUS) liquid [id number] No Take 30 mLs by mouth daily.  Patient not taking: Reported on 11/02/2019   Modena Jansky, MD Not Taking Active   Nutritional Supplements (FEEDING SUPPLEMENT, KATE FARMS STANDARD 1.4,) LIQD liquid [id number] No Take 325 mLs by mouth 2 (two) times daily between meals.  Patient not taking: Reported on 11/02/2019   Modena Jansky, MD Not Taking Active   ondansetron (ZOFRAN) 8 MG tablet [id number] No Take 8 mg by mouth every 8 (eight) hours as needed.  Patient not taking: Reported on 11/02/2019   [provider] Not Taking Active Self  oxyCODONE (OXYCONTIN) 20 mg 12 hr tablet [id number] No Take 1 tablet (20 mg total) by mouth every 12 (twelve) hours.  Patient not taking: Reported on 11/02/2019   [name], MD Not Taking Active Self  Oxycodone HCl 10 MG TABS [id number] No Take 10 mg by mouth every 3 (three) hours as needed (pain).   Patient not taking: Reported on 11/02/2019   [provider] Not Taking Active Self  pantoprazole (PROTONIX) 40 MG tablet [id number] No TAKE 1 TABLET BY MOUTH ONCE DAILY  Patient not taking: Reported on 11/02/2019   [name], MD Not Taking Active Self  phenazopyridine (PYRIDIUM) 200 MG tablet [id number] No Take 1 tablet (200 mg total) by mouth 3 (three) times daily as needed for pain.  Patient not taking: Reported on 11/02/2019   Harle Stanford., PA-C Not Taking Active Self  prochlorperazine (COMPAZINE) 10 MG tablet [id number] No TAKE 1 TABLET BY MOUTH EVERY 6 HOURS AS NEEDED FOR NAUSEA & VOMITING  Patient not taking: Reported on 11/02/2019   [name],  MD Not Taking Active Self  promethazine (PHENERGAN) 25 MG suppository [id number] No Place 1 suppository (25 mg total) rectally every 6 (six) hours as needed for nausea or vomiting.  Patient not taking: Reported on 11/02/2019   [name], MD Not Taking Active Self  promethazine (PHENERGAN) 25 MG tablet [id number] No Take 1 tablet (25 mg total) by mouth every 6 (six) hours as needed for nausea or vomiting.  Patient not taking: Reported on 11/02/2019   [name], MD Not Taking Active Self  sodium hypochlorite (DAKIN'S 1/4 STRENGTH) 0.125 % SOLN [id number] No Irrigate with as directed every morning. Irrigation, Every morning - 10a, First dose on Mon 09/28/19 at 1300, For 5 days  Patient not taking: Reported on 11/02/2019   Modena Jansky, MD  Not Taking Active   sucralfate (CARAFATE) 1 GM/10ML suspension [id number] No Take 1 g by mouth 4 (four) times daily as needed (prevent ulcers).   Patient not taking: Reported on 11/02/2019   [provider] Not Taking Active Self  SYNTHROID 75 MCG tablet [id number] Yes Take 1 tablet (75 mcg total) by mouth daily before breakfast. [name], MD Taking Active           Fall/Depression Screening:  Fall Risk  11/27/2018 07/04/2017 05/17/2016  Falls in the past year? 0 No No   PHQ 2/9 Scores 11/27/2018 07/04/2017 05/17/2016  PHQ - 2 Score 0 0 0    Assessment:  Goals Addressed              This Visit's Progress   .  "I need help with dressing supplies" (pt-stated)        CARE PLAN ENTRY Medicaid Managed Care (see longitudinal plan of care for additional care plan information)  Current Barriers:  . Care Coordination needs related to dressing supplies, need help with obtaining dressing supplies.   Nurse Case Manager Clinical Goal(s):  Marland Kitchen Over the next 7 days, patient will verbalize understanding of plan for obtaining needed supplies for dressing changes. Interventions:  . Inter-disciplinary care team collaboration (see longitudinal  plan of care) . Evaluation of current treatment plan related to cancer treatment/wound care/dressing changes  and patient's adherence to plan as established by provider. . Advised patient to contact [employer] (plan) for questions related to management of health care benefits. . Provided education to patient re: securing dressing supplies. Nash Dimmer with [employer] case manager regarding benefits and coverage for supplies.  Plan:  . Patient will contact [employer] for any questions. Marland Kitchen RNCM will contact [employer] case manager and follow up with patient.   Initial goal documentation        Plan: RNCM will follow up with patient as noted above.

## 2019-11-16 DIAGNOSIS — Z5112 Encounter for antineoplastic immunotherapy: Secondary | ICD-10-CM | POA: Diagnosis not present

## 2019-11-16 DIAGNOSIS — Z79899 Other long term (current) drug therapy: Secondary | ICD-10-CM | POA: Diagnosis not present

## 2019-11-16 DIAGNOSIS — Z171 Estrogen receptor negative status [ER-]: Secondary | ICD-10-CM | POA: Diagnosis not present

## 2019-11-16 DIAGNOSIS — C50811 Malignant neoplasm of overlapping sites of right female breast: Secondary | ICD-10-CM | POA: Diagnosis not present

## 2019-11-16 MED FILL — MIRTAZAPINE 15 MG TABS: 15 | 30 days supply | Qty: 30 | Fill #0

## 2019-11-30 ENCOUNTER — Other Ambulatory Visit: Payer: Self-pay

## 2019-11-30 NOTE — Patient Instructions (Signed)
Hi Ms. Ensz,  as a part of your Medicaid benefit, you are eligible for care management and care coordination services at no cost or copay. I was unable to reach you by phone today but would be happy to help you with your health related needs. Please feel free to call me at [phone].  A member of the Managed Medicaid care management team will reach out to you again over the next 7 days.   Aida Raider RN, BSN [place]  Tri[employer] Curator - Managed Medicaid High Risk [phone]

## 2019-11-30 NOTE — Patient Outreach (Signed)
Care Coordination  11/30/2019  HANNIE SHOE [birth year] [id number]   An unsuccessful telephone outreach was attempted today. The patient was referred to the case management team for assistance with care management and care coordination.   Follow Up Plan: The Managed Medicaid care management team will reach out to the patient again over the next 7 days.   Aida Raider RN, BSN [place]  [employer] Curator - Managed Medicaid High Risk [phone].

## 2019-12-03 ENCOUNTER — Other Ambulatory Visit: Payer: Self-pay | Admitting: Obstetrics and Gynecology

## 2019-12-03 ENCOUNTER — Other Ambulatory Visit: Payer: Self-pay

## 2019-12-03 NOTE — Patient Instructions (Signed)
Hi Ms.Vanhorn, thank you for speaking with me today.  Ms. Fadely was given information about Medicaid Managed Care team care coordination services as a part of their Healthy [facility] Medicaid benefit. Wynonia Sours verbally consented to engagement with the [facility] Managed Care team.   For questions related to your Healthy [facility] health plan, please call: [phone] or visit the homepage here: [email or web address]  If you would like to schedule transportation through your Healthy [facility] plan, please call the following number at least 2 days in advance of your appointment: [phone]  Goals Addressed              This Visit's Progress   .  "I need help with dressing supplies" (pt-stated)        CARE PLAN ENTRY Medicaid Managed Care (see longitudinal plan of care for additional care plan information)  Current Barriers:  . Care Coordination needs related to dressing supplies, need help with obtaining dressing supplies.   Nurse Case Manager Clinical Goal(s):  Marland Kitchen Over the next 7 days, patient will verbalize understanding of plan for obtaining needed supplies for dressing changes. Interventions:  . Inter-disciplinary care team collaboration (see longitudinal plan of care) . Evaluation of current treatment plan related to cancer treatment/wound care/dressing changes  and patient's adherence to plan as established by provider. . Advised patient to contact [employer] (plan) for questions related to management of health care benefits. . Provided education to patient re: securing dressing supplies.  Update-Patient given phone number for Medicaid Ombudsman. Nash Dimmer with [employer] case manager regarding benefits and coverage for supplies.  Plan:  . Patient will contact [employer] for any questions. Marland Kitchen RNCM will contact [employer] case manager and follow up with patient.   Initial goal documentation        The Managed Medicaid  care management team will reach out to the patient again over the next 30 days.   Aida Raider RN, BSN [place]  [employer]ad Curator - Managed Medicaid High Risk [phone]

## 2019-12-03 NOTE — Patient Outreach (Signed)
Care Coordination - Case Manager  12/03/2019  Kara Pittman [birth year] [id number]  Subjective:  Kara Pittman is an 54 y.o. year old female who is a primary patient of Hensel, Jamal Collin, MD.  Kara Pittman was given information about Medicaid Managed Care team care coordination services today. Wynonia Sours agreed to services and verbal consent obtained  Review of patient status, laboratory and other test data was performed as part of evaluation for provision of services.  SDOH: SDOH Screenings   Alcohol Screen:   . Last Alcohol Screening Score (AUDIT): Not on file  Depression (PHQ2-9):   . PHQ-2 Score: Not on file  Financial Resource Strain:   . Difficulty of Paying Living Expenses: Not on file  Food Insecurity:   . Worried About Charity fundraiser in the Last Year: Not on file  . Ran Out of Food in the Last Year: Not on file  Housing:   . Last Housing Risk Score: Not on file  Physical Activity:   . Days of Exercise per Week: Not on file  . Minutes of Exercise per Session: Not on file  Social Connections:   . Frequency of Communication with Friends and Family: Not on file  . Frequency of Social Gatherings with Friends and Family: Not on file  . Attends Religious Services: Not on file  . Active Member of Clubs or Organizations: Not on file  . Attends Archivist Meetings: Not on file  . Marital Status: Not on file  Stress:   . Feeling of Stress : Not on file  Tobacco Use: Low Risk   . Smoking Tobacco Use: Never Smoker  . Smokeless Tobacco Use: Never Used  Transportation Needs: No Transportation Needs  . Lack of Transportation (Medical): No  . Lack of Transportation (Non-Medical): No     Objective:    Allergies  Allergen Reactions  . Hydrocodone-Acetaminophen Nausea Only  . Morphine And Related Hives    Medications:    Medications Reviewed Today    Reviewed by Gayla Medicus, RN (Registered Nurse) on 11/02/19 at 1127  Med List Status: <None>    Medication Order Taking? Sig Documenting Provider Last Dose Status Informant  acetaminophen (TYLENOL) 500 MG tablet [id number] Yes Take 500-1,000 mg by mouth every 6 (six) hours as needed for mild pain or headache. [provider] Taking Active Self  buPROPion (WELLBUTRIN XL) 150 MG 24 hr tablet [id number] No Take 1 tablet by mouth daily  Patient not taking: Reported on 11/02/2019   [name], MD Not Taking Active Self  CALCIUM PO [id number]  Take 1 tablet by mouth daily as needed (low calcium).  [provider]  Active Self  chlorpheniramine-HYDROcodone (TUSSIONEX) 10-8 MG/5ML SUER [id number] No Take 5 mLs by mouth every 12 (twelve) hours as needed for cough.  Patient not taking: Reported on 11/02/2019   [name], MD Not Taking Active Self  docusate sodium (COLACE) 100 MG capsule [id number] No Take 1 capsule (100 mg total) by mouth 2 (two) times daily.  Patient not taking: Reported on 11/02/2019   [name], MD Not Taking Active Self  gabapentin (NEURONTIN) 300 MG capsule [id number] No Take 1 capsule (300 mg total) by mouth at bedtime.  Patient not taking: Reported on 11/02/2019   [name], MD Not Taking Active Self  ibuprofen (ADVIL) 200 MG tablet [id number] Yes Take 200 mg by mouth every 6 (six) hours as needed for moderate pain. [provider] Taking Active Self  lidocaine-prilocaine (EMLA) cream [id number] No Apply to affected area once  Patient not taking: Reported on 11/02/2019   [name], MD Not Taking Active Self           Med Note Glennon Hamilton, THU LE   Wed Jan 28, 2019  4:25 PM)    LORazepam (ATIVAN) 0.5 MG tablet [id number] No Take 1 tablet (0.5 mg total) by mouth at bedtime as needed for sleep.  Patient not taking: Reported on 11/02/2019   [name], MD Not Taking Active Self  methocarbamol (ROBAXIN) 500 MG tablet [id number] No Take 1 tablet (500 mg total) by mouth every 6 (six) hours as needed for muscle spasms.  Patient not taking:  Reported on 11/02/2019   [name], MD Not Taking Active Self  Multiple Vitamins-Calcium (ONE-A-DAY WOMENS FORMULA) TABS [id number] No Take 1 tablet by mouth daily as needed (nutrition).   Patient not taking: Reported on 11/02/2019   [provider] Not Taking Active Self  nutrition supplement, Leanord Asal) PACK [id number]  Take 1 packet by mouth 2 (two) times daily between meals. Modena Jansky, MD  Active   Nutritional Supplements (,FEEDING SUPPLEMENT, PROSOURCE PLUS) liquid [id number] No Take 30 mLs by mouth daily.  Patient not taking: Reported on 11/02/2019   Modena Jansky, MD Not Taking Active   Nutritional Supplements (FEEDING SUPPLEMENT, KATE FARMS STANDARD 1.4,) LIQD liquid [id number] No Take 325 mLs by mouth 2 (two) times daily between meals.  Patient not taking: Reported on 11/02/2019   Modena Jansky, MD Not Taking Active   ondansetron (ZOFRAN) 8 MG tablet [id number] No Take 8 mg by mouth every 8 (eight) hours as needed.  Patient not taking: Reported on 11/02/2019   [provider] Not Taking Active Self  oxyCODONE (OXYCONTIN) 20 mg 12 hr tablet [id number] No Take 1 tablet (20 mg total) by mouth every 12 (twelve) hours.  Patient not taking: Reported on 11/02/2019   [name], MD Not Taking Active Self  Oxycodone HCl 10 MG TABS [id number] No Take 10 mg by mouth every 3 (three) hours as needed (pain).   Patient not taking: Reported on 11/02/2019   [provider] Not Taking Active Self  pantoprazole (PROTONIX) 40 MG tablet [id number] No TAKE 1 TABLET BY MOUTH ONCE DAILY  Patient not taking: Reported on 11/02/2019   [name], MD Not Taking Active Self  phenazopyridine (PYRIDIUM) 200 MG tablet [id number] No Take 1 tablet (200 mg total) by mouth 3 (three) times daily as needed for pain.  Patient not taking: Reported on 11/02/2019   Harle Stanford., PA-C Not Taking Active Self  prochlorperazine (COMPAZINE) 10 MG tablet [id number] No TAKE 1 TABLET BY  MOUTH EVERY 6 HOURS AS NEEDED FOR NAUSEA & VOMITING  Patient not taking: Reported on 11/02/2019   [name], MD Not Taking Active Self  promethazine (PHENERGAN) 25 MG suppository [id number] No Place 1 suppository (25 mg total) rectally every 6 (six) hours as needed for nausea or vomiting.  Patient not taking: Reported on 11/02/2019   [name], MD Not Taking Active Self  promethazine (PHENERGAN) 25 MG tablet [id number] No Take 1 tablet (25 mg total) by mouth every 6 (six) hours as needed for nausea or vomiting.  Patient not taking: Reported on 11/02/2019   [name], MD Not Taking Active Self  sodium hypochlorite (DAKIN'S 1/4 STRENGTH) 0.125 % SOLN [id number] No Irrigate with as directed every morning. Irrigation, Every morning - 10a, First dose on Mon 09/28/19 at 1300,  For 5 days  Patient not taking: Reported on 11/02/2019   Modena Jansky, MD Not Taking Active   sucralfate (CARAFATE) 1 GM/10ML suspension [id number] No Take 1 g by mouth 4 (four) times daily as needed (prevent ulcers).   Patient not taking: Reported on 11/02/2019   [provider] Not Taking Active Self  SYNTHROID 75 MCG tablet [id number] Yes Take 1 tablet (75 mcg total) by mouth daily before breakfast. [name], MD Taking Active           Assessment:   Goals Addressed              This Visit's Progress   .  "I need help with dressing supplies" (pt-stated)        CARE PLAN ENTRY Medicaid Managed Care (see longitudinal plan of care for additional care plan information)  Current Barriers:  . Care Coordination needs related to dressing supplies, need help with obtaining dressing supplies.   Nurse Case Manager Clinical Goal(s):  Marland Kitchen Over the next 7 days, patient will verbalize understanding of plan for obtaining needed supplies for dressing changes. Interventions:  . Inter-disciplinary care team collaboration (see longitudinal plan of care) . Evaluation of current treatment plan related to  cancer treatment/wound care/dressing changes  and patient's adherence to plan as established by provider. . Advised patient to contact [employer] (plan) for questions related to management of health care benefits. . Provided education to patient re: securing dressing supplies.  Update-Patient given phone number for Medicaid Ombudsman. Nash Dimmer with [employer] case manager regarding benefits and coverage for supplies.  Plan:  . Patient will contact [employer] for any questions. Marland Kitchen RNCM will contact [employer] case manager and follow up with patient.   Initial goal documentation        Plan: RNCM will follow up with patient within 30 days.

## 2019-12-04 DIAGNOSIS — Z171 Estrogen receptor negative status [ER-]: Secondary | ICD-10-CM | POA: Diagnosis not present

## 2019-12-04 DIAGNOSIS — R918 Other nonspecific abnormal finding of lung field: Secondary | ICD-10-CM | POA: Diagnosis not present

## 2019-12-04 DIAGNOSIS — C50811 Malignant neoplasm of overlapping sites of right female breast: Secondary | ICD-10-CM | POA: Diagnosis not present

## 2019-12-07 ENCOUNTER — Other Ambulatory Visit (HOSPITAL_COMMUNITY): Payer: Self-pay | Admitting: Internal Medicine

## 2019-12-07 DIAGNOSIS — F32A Depression, unspecified: Secondary | ICD-10-CM | POA: Diagnosis not present

## 2019-12-07 DIAGNOSIS — C50811 Malignant neoplasm of overlapping sites of right female breast: Secondary | ICD-10-CM | POA: Diagnosis not present

## 2019-12-07 DIAGNOSIS — Z923 Personal history of irradiation: Secondary | ICD-10-CM | POA: Diagnosis not present

## 2019-12-07 DIAGNOSIS — C7989 Secondary malignant neoplasm of other specified sites: Secondary | ICD-10-CM | POA: Diagnosis not present

## 2019-12-07 DIAGNOSIS — Z171 Estrogen receptor negative status [ER-]: Secondary | ICD-10-CM | POA: Diagnosis not present

## 2019-12-07 DIAGNOSIS — Z79891 Long term (current) use of opiate analgesic: Secondary | ICD-10-CM | POA: Diagnosis not present

## 2019-12-07 DIAGNOSIS — Z9011 Acquired absence of right breast and nipple: Secondary | ICD-10-CM | POA: Diagnosis not present

## 2019-12-07 DIAGNOSIS — Z79899 Other long term (current) drug therapy: Secondary | ICD-10-CM | POA: Diagnosis not present

## 2019-12-07 DIAGNOSIS — Z5112 Encounter for antineoplastic immunotherapy: Secondary | ICD-10-CM | POA: Diagnosis not present

## 2019-12-07 DIAGNOSIS — F419 Anxiety disorder, unspecified: Secondary | ICD-10-CM | POA: Diagnosis not present

## 2019-12-07 MED FILL — METRONIDAZOLE 500 MG TABS: 500 | 25 days supply | Qty: 50 | Fill #0

## 2019-12-28 ENCOUNTER — Other Ambulatory Visit (HOSPITAL_COMMUNITY): Payer: Self-pay | Admitting: Internal Medicine

## 2019-12-28 DIAGNOSIS — G893 Neoplasm related pain (acute) (chronic): Secondary | ICD-10-CM | POA: Diagnosis not present

## 2019-12-28 DIAGNOSIS — Z171 Estrogen receptor negative status [ER-]: Secondary | ICD-10-CM | POA: Diagnosis not present

## 2019-12-28 DIAGNOSIS — F418 Other specified anxiety disorders: Secondary | ICD-10-CM | POA: Diagnosis not present

## 2019-12-28 DIAGNOSIS — Z79899 Other long term (current) drug therapy: Secondary | ICD-10-CM | POA: Diagnosis not present

## 2019-12-28 DIAGNOSIS — Z5112 Encounter for antineoplastic immunotherapy: Secondary | ICD-10-CM | POA: Diagnosis not present

## 2019-12-28 DIAGNOSIS — N390 Urinary tract infection, site not specified: Secondary | ICD-10-CM | POA: Diagnosis not present

## 2019-12-28 DIAGNOSIS — Z923 Personal history of irradiation: Secondary | ICD-10-CM | POA: Diagnosis not present

## 2019-12-28 DIAGNOSIS — C792 Secondary malignant neoplasm of skin: Secondary | ICD-10-CM | POA: Diagnosis not present

## 2019-12-28 DIAGNOSIS — Z9011 Acquired absence of right breast and nipple: Secondary | ICD-10-CM | POA: Diagnosis not present

## 2019-12-28 DIAGNOSIS — C50811 Malignant neoplasm of overlapping sites of right female breast: Secondary | ICD-10-CM | POA: Diagnosis not present

## 2019-12-28 MED FILL — CIPROFLOXACIN HCL 500 MG TA: 500 | 7 days supply | Qty: 14 | Fill #0

## 2019-12-30 ENCOUNTER — Other Ambulatory Visit: Payer: Self-pay

## 2019-12-30 NOTE — Patient Outreach (Signed)
Care Coordination  12/30/2019  JANIQUA FRISCIA 10/08/1965 [id number]  An unsuccessful telephone outreach was attempted today. The patient was referred to the case management team for assistance with care management and care coordination.   Follow Up Plan: RNCM will follow up with patient within next 7-14 days.  Aida Raider RN, BSN [place]  [facility] Curator - Managed Medicaid High Risk [phone]

## 2019-12-30 NOTE — Patient Instructions (Signed)
Hi Kara Pittman, sorry we missed you today - as a part of your Medicaid benefit, you are eligible for care management and care coordination services at no cost or copay. I was unable to reach you by phone today but would be happy to help you with your health related needs. Please feel free to call me at [phone]  A member of the Managed Medicaid care management team will reach out to you again over the next 7-14 days.  Aida Raider RN, BSN [place]  [facility] Curator - Managed Medicaid High Risk [phone].

## 2020-01-11 ENCOUNTER — Other Ambulatory Visit: Payer: Self-pay | Admitting: Obstetrics and Gynecology

## 2020-01-11 NOTE — Patient Outreach (Signed)
Care Coordination  01/11/2020  VONDRA ALDREDGE December 02, 1965 [id number]    Medicaid Managed Care   Unsuccessful Outreach Note  01/11/2020 Name: LUKE RIGSBEE MRN: [id number] DOB: August 05, 1965  Referred by: [name], MD Reason for referral : High Risk Managed Medicaid (follow up)   TEAGYN FISHEL is enrolled in a Managed Medicaid Health Plan: Yes  A second unsuccessful telephone outreach was attempted today. The patient was referred to the case management team for assistance with care management and care coordination.   Follow Up Plan: The Managed Medicaid care management team will reach out to the patient again over the next 7-14 days.   Aida Raider RN, BSN [place]  [facility]ator - Managed Medicaid High Risk [phone].

## 2020-01-11 NOTE — Patient Instructions (Signed)
Hi Kara Pittman, sorry we missed you today - as a part of your Medicaid benefit, you are eligible for care management and care coordination services at no cost or copay. I was unable to reach you by phone today but would be happy to help you with your health related needs. Please feel free to call me at [phone].  A member of the Managed Medicaid care management team will reach out to you again over the next 7-14 days.   Aida Raider RN, BSN [place]  [facility]ator - Managed Medicaid High Risk [phone]

## 2020-01-13 MED FILL — SYNTHROID 75 MCG TABLET: 75 | 90 days supply | Qty: 90 | Fill #1

## 2020-01-14 MED FILL — LIDOCAINE-PRILOCAINE CREAM: 2.5-2.5 | 14 days supply | Qty: 30 | Fill #1

## 2020-01-18 DIAGNOSIS — R52 Pain, unspecified: Secondary | ICD-10-CM | POA: Diagnosis not present

## 2020-01-18 DIAGNOSIS — C50811 Malignant neoplasm of overlapping sites of right female breast: Secondary | ICD-10-CM | POA: Diagnosis not present

## 2020-01-18 DIAGNOSIS — Z79899 Other long term (current) drug therapy: Secondary | ICD-10-CM | POA: Diagnosis not present

## 2020-01-18 DIAGNOSIS — Z171 Estrogen receptor negative status [ER-]: Secondary | ICD-10-CM | POA: Diagnosis not present

## 2020-01-18 DIAGNOSIS — Z5112 Encounter for antineoplastic immunotherapy: Secondary | ICD-10-CM | POA: Diagnosis not present

## 2020-01-19 ENCOUNTER — Other Ambulatory Visit: Payer: Self-pay | Admitting: Obstetrics and Gynecology

## 2020-01-19 NOTE — Patient Outreach (Signed)
Care Coordination  01/19/2020  Kara Pittman [birth year] [id number]    Medicaid Managed Care   Unsuccessful Outreach Note  01/19/2020 Name: Kara Pittman MRN: [id number] DOB: Nov 29, 1965  Referred by: [name], MD Reason for referral : High Risk Managed Medicaid (Unsuccessful telephone outreach/)   Third unsuccessful telephone outreach was attempted today. The patient was referred to the case management team for assistance with care management and care coordination. The patient's primary care provider has been notified of our unsuccessful attempts to make or maintain contact with the patient. The care management team is pleased to engage with this patient at any time in the future should he/she be interested in assistance from the care management team.   Follow Up Plan: The patient has been provided with contact information for the Managed Medicaid care management team and has been advised to call with any health related questions or concerns.  The Managed Medicaid care management team is available to follow up with the patient after provider conversation with the patient regarding recommendation for care management engagement and subsequent re-referral to the care management team.  Aida Raider RN, BSN [place] Management Coordinator - Managed [facility] High Risk [phone].

## 2020-02-08 ENCOUNTER — Other Ambulatory Visit (HOSPITAL_COMMUNITY): Payer: Self-pay | Admitting: Internal Medicine

## 2020-02-08 DIAGNOSIS — Z5112 Encounter for antineoplastic immunotherapy: Secondary | ICD-10-CM | POA: Diagnosis not present

## 2020-02-08 DIAGNOSIS — R918 Other nonspecific abnormal finding of lung field: Secondary | ICD-10-CM | POA: Diagnosis not present

## 2020-02-08 DIAGNOSIS — Z171 Estrogen receptor negative status [ER-]: Secondary | ICD-10-CM | POA: Diagnosis not present

## 2020-02-08 DIAGNOSIS — Z79899 Other long term (current) drug therapy: Secondary | ICD-10-CM | POA: Diagnosis not present

## 2020-02-08 DIAGNOSIS — C792 Secondary malignant neoplasm of skin: Secondary | ICD-10-CM | POA: Diagnosis not present

## 2020-02-08 DIAGNOSIS — C779 Secondary and unspecified malignant neoplasm of lymph node, unspecified: Secondary | ICD-10-CM | POA: Diagnosis not present

## 2020-02-08 DIAGNOSIS — F418 Other specified anxiety disorders: Secondary | ICD-10-CM | POA: Diagnosis not present

## 2020-02-08 DIAGNOSIS — C787 Secondary malignant neoplasm of liver and intrahepatic bile duct: Secondary | ICD-10-CM | POA: Diagnosis not present

## 2020-02-08 DIAGNOSIS — Z87440 Personal history of urinary (tract) infections: Secondary | ICD-10-CM | POA: Diagnosis not present

## 2020-02-08 DIAGNOSIS — Z9011 Acquired absence of right breast and nipple: Secondary | ICD-10-CM | POA: Diagnosis not present

## 2020-02-08 DIAGNOSIS — Z923 Personal history of irradiation: Secondary | ICD-10-CM | POA: Diagnosis not present

## 2020-02-08 DIAGNOSIS — C7800 Secondary malignant neoplasm of unspecified lung: Secondary | ICD-10-CM | POA: Diagnosis not present

## 2020-02-08 DIAGNOSIS — C50811 Malignant neoplasm of overlapping sites of right female breast: Secondary | ICD-10-CM | POA: Diagnosis not present

## 2020-02-08 MED FILL — PANTOPRAZOLE SOD DR 40 MG T: 40 | 30 days supply | Qty: 30 | Fill #0

## 2020-02-16 ENCOUNTER — Telehealth: Payer: Self-pay | Admitting: *Deleted

## 2020-02-16 NOTE — Telephone Encounter (Signed)
Patient called stating that she had follow up with [facility] on 02/08/2020. She was instructed to follow up locally in [place] with Dr Sondra Come.  Routed to Dr. Sondra Come to advise when she should be scheduled for follow up here in the office.

## 2020-02-17 ENCOUNTER — Telehealth: Payer: Self-pay | Admitting: *Deleted

## 2020-02-17 NOTE — Telephone Encounter (Signed)
CALLED PATIENT TO ASK ABOUT COMING IN FOR FU APPT., PATIENT WANTS TO COME ON 02-23-20, SHE AGREED TO COME ON 02/23/20 @ 4 PM

## 2020-02-18 ENCOUNTER — Telehealth: Payer: Self-pay | Admitting: Medical Oncology

## 2020-02-18 NOTE — Telephone Encounter (Signed)
UPBEAT: Outgoing call Call to patient to discuss with her next study visit and whether she wishes to continue with study. Patient is being treated at [facility] for metastasis. I informed patient that she does not have to continue with study if she does not wish to and this would be perfectly understandable. I provided her with the option to stop ongoing study assessments at this time. Patient states that she would like to continue with study as much as she can. I then informed her of the data that we would be collecting for this upcoming 12 month visit. I suggested to patient to think about the information we discussed today and to let me know what her wishes were, patient expressed that this would be a good idea. I thanked patient for her ongoing support of study and her time. All patient's questions were answered to her satisfaction. Patient has my contact information to call me with questions.  [name], RN, BSN, [facility] Clinical Research 02/18/2020 4:36 PM

## 2020-02-23 ENCOUNTER — Ambulatory Visit
Admission: RE | Admit: 2020-02-23 | Discharge: 2020-02-23 | Disposition: A | Discharge status: Home / Self Care | Payer: Medicaid Other | Source: Ambulatory Visit | Attending: Radiation Oncology | Admitting: Radiation Oncology

## 2020-02-23 ENCOUNTER — Encounter: Payer: Self-pay | Admitting: Radiation Oncology

## 2020-02-23 ENCOUNTER — Telehealth: Payer: Self-pay | Admitting: *Deleted

## 2020-02-23 ENCOUNTER — Other Ambulatory Visit: Payer: Self-pay

## 2020-02-23 VITALS — BP 107/79 | HR 92 | Temp 97.7°F | Resp 18 | Ht 65.0 in | Wt 147.4 lb

## 2020-02-23 DIAGNOSIS — Z171 Estrogen receptor negative status [ER-]: Secondary | ICD-10-CM

## 2020-02-23 DIAGNOSIS — C50311 Malignant neoplasm of lower-inner quadrant of right female breast: Secondary | ICD-10-CM

## 2020-02-23 NOTE — Progress Notes (Signed)
Patient is here today for follow up after visit with [facility] on 02/08/2020.  Patient reports that she has responded well to the [facility] however, since receiving her Covid injections she has started to have joint pain.  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  Patient reports still having issues post radiation to the right chest wall.   Vitals:   02/23/20 1635  BP: 107/79  Pulse: 92  Resp: 18  Temp: 97.7 F (36.5 C)  TempSrc: Temporal  SpO2: 100%  Weight: 147 lb 6 oz (66.8 kg)  Height: 5\' 5"  (1.651 m)

## 2020-02-23 NOTE — Progress Notes (Incomplete)
Radiation Oncology         [phone] ________________________________  Name: Kara Pittman MRN: [id number]  Date: 02/23/2020  DOB: 10-03-1965  Follow-Up Visit Note  CC: Andria Frames Jamal Collin, MD  [name], MD  No diagnosis found.  Diagnosis: StageIIB (ypT4b, pN0)RightBreast LIQ,InvasiveMetaplasticCarcinoma, ER-/ PR-/ Her2-,Grade 3(now metastatic)  Interval Since Last Radiation: Six months, two weeks, and three days  Radiation Treatment Dates: 06/18/2019 through 08/06/2019              Site Technique Total Dose (Gy) Dose per Fx (Gy) Completed Fx Beam Energies  Chest Wall, Right: CW_Rt_Bst Electron 12/12 2 6/6 9E   Chest Wall, Right: CW_Rt_Bst_e- Electron 4/4 2 2/2 9E   Chest Wall, Right: CW_Rt 3D 50/50 2 25/25 6X   Chest Wall, Right: CW_Rt_SCV 3D 50/50 2 25/25 6X    Cumulative dose to the largest nodules along the right chest wall of 66 Gyin 33 fractions along with radiosensitizing chemotherapy.   Narrative:  The patient returns today for  follow-up. Since her last visit, she was admitted to the hospital from 09/27/19 - 10/02/19 for sepsis due to gram-negative UTI and open wound of right chest wall.    She was last seen by Dr. Wetzel Bjornstad, medical oncologist at [facility], on 02/08/2020. She continues to do well on Pembrolizumab.  On review of systems, she reports ***. She denies ***.        ALLERGIES:  is allergic to hydrocodone-acetaminophen and morphine and related.  Meds: Current Outpatient Medications  Medication Sig Dispense Refill  . acetaminophen (TYLENOL) 500 MG tablet Take 500-1,000 mg by mouth every 6 (six) hours as needed for mild pain or headache.    Marland Kitchen buPROPion (WELLBUTRIN XL) 150 MG 24 hr tablet Take 1 tablet by mouth daily (Patient not taking: Reported on 11/02/2019) 90 tablet 3  . CALCIUM PO Take 1 tablet by mouth daily as needed (low calcium).     . chlorpheniramine-HYDROcodone (TUSSIONEX) 10-8 MG/5ML SUER Take 5 mLs by mouth every 12  (twelve) hours as needed for cough. (Patient not taking: Reported on 11/02/2019) 115 mL 0  . docusate sodium (COLACE) 100 MG capsule Take 1 capsule (100 mg total) by mouth 2 (two) times daily. (Patient not taking: Reported on 11/02/2019) 20 capsule 0  . gabapentin (NEURONTIN) 300 MG capsule Take 1 capsule (300 mg total) by mouth at bedtime. (Patient not taking: Reported on 11/02/2019) 30 capsule 1  . ibuprofen (ADVIL) 200 MG tablet Take 200 mg by mouth every 6 (six) hours as needed for moderate pain.    Marland Kitchen lidocaine-prilocaine (EMLA) cream Apply to affected area once (Patient not taking: Reported on 11/02/2019) 30 g 3  . LORazepam (ATIVAN) 0.5 MG tablet Take 1 tablet (0.5 mg total) by mouth at bedtime as needed for sleep. (Patient not taking: Reported on 11/02/2019) 30 tablet 0  . methocarbamol (ROBAXIN) 500 MG tablet Take 1 tablet (500 mg total) by mouth every 6 (six) hours as needed for muscle spasms. (Patient not taking: Reported on 11/02/2019) 20 tablet 0  . Multiple Vitamins-Calcium (ONE-A-DAY WOMENS FORMULA) TABS Take 1 tablet by mouth daily as needed (nutrition).  (Patient not taking: Reported on 11/02/2019)    . nutrition supplement, JUVEN, (JUVEN) PACK Take 1 packet by mouth 2 (two) times daily between meals. 30 each 0  . Nutritional Supplements (,FEEDING SUPPLEMENT, PROSOURCE PLUS) liquid Take 30 mLs by mouth daily. (Patient not taking: Reported on 11/02/2019) 887 mL 0  . Nutritional Supplements (FEEDING  SUPPLEMENT, KATE FARMS STANDARD 1.4,) LIQD liquid Take 325 mLs by mouth 2 (two) times daily between meals. (Patient not taking: Reported on 11/02/2019) 325 mL 20  . ondansetron (ZOFRAN) 8 MG tablet Take 8 mg by mouth every 8 (eight) hours as needed. (Patient not taking: Reported on 11/02/2019)    . oxyCODONE (OXYCONTIN) 20 mg 12 hr tablet Take 1 tablet (20 mg total) by mouth every 12 (twelve) hours. (Patient not taking: Reported on 11/02/2019) 30 tablet 0  . Oxycodone HCl 10 MG TABS Take 10 mg by mouth  every 3 (three) hours as needed (pain).  (Patient not taking: Reported on 11/02/2019)    . pantoprazole (PROTONIX) 40 MG tablet TAKE 1 TABLET BY MOUTH ONCE DAILY (Patient not taking: Reported on 11/02/2019) 30 tablet 0  . phenazopyridine (PYRIDIUM) 200 MG tablet Take 1 tablet (200 mg total) by mouth 3 (three) times daily as needed for pain. (Patient not taking: Reported on 11/02/2019) 10 tablet 1  . prochlorperazine (COMPAZINE) 10 MG tablet TAKE 1 TABLET BY MOUTH EVERY 6 HOURS AS NEEDED FOR NAUSEA & VOMITING (Patient not taking: Reported on 11/02/2019) 30 tablet 1  . promethazine (PHENERGAN) 25 MG suppository Place 1 suppository (25 mg total) rectally every 6 (six) hours as needed for nausea or vomiting. (Patient not taking: Reported on 11/02/2019) 12 each 1  . promethazine (PHENERGAN) 25 MG tablet Take 1 tablet (25 mg total) by mouth every 6 (six) hours as needed for nausea or vomiting. (Patient not taking: Reported on 11/02/2019) 30 tablet 1  . sodium hypochlorite (DAKIN'S 1/4 STRENGTH) 0.125 % SOLN Irrigate with as directed every morning. Irrigation, Every morning - 10a, First dose on Mon 09/28/19 at 1300, For 5 days (Patient not taking: Reported on 11/02/2019) 473 mL 0  . sucralfate (CARAFATE) 1 GM/10ML suspension Take 1 g by mouth 4 (four) times daily as needed (prevent ulcers).  (Patient not taking: Reported on 11/02/2019)    . SYNTHROID 75 MCG tablet Take 1 tablet (75 mcg total) by mouth daily before breakfast. 90 tablet 3   No current facility-administered medications for this encounter.    Physical Findings: The patient is in no acute distress. Patient is alert and oriented.  vitals were not taken for this visit.   Lungs are clear to auscultation bilaterally. Heart has regular rate and rhythm. No palpable cervical, supraclavicular, or axillary adenopathy. Abdomen soft, non-tender, normal bowel sounds. ***  Lab Findings: Lab Results  Component Value Date   WBC 10.6 (H) 10/02/2019   HGB 8.5 (L)  10/02/2019   HCT 28.7 (L) 10/02/2019   MCV 95.7 10/02/2019   PLT 386 10/02/2019    Radiographic Findings: No results found.  Impression: StageIIB (ypT4b, pN0)RightBreast LIQ,InvasiveMetaplasticCarcinoma, ER-/ PR-/ Her2-,Grade 3(now metastatic)  ***  Plan: ***  Total time spent in this encounter was *** minutes which included reviewing the patient's most recent hospitalization, follow-ups, physical examination, and documentation.  ____________________________________   Blair Promise, PhD, MD  This document serves as a record of services personally performed by [name], MD. It was created on his behalf by Clerance Lav, a trained medical scribe. The creation of this record is based on the scribe's personal observations and the provider's statements to them. This document has been checked and approved by the attending provider.

## 2020-02-23 NOTE — Telephone Encounter (Signed)
CALLED PATIENT TO ASK ABOUT COMING ON 03-01-20 @ 4 PM INSTEAD OF TODAY PER DR. KINARD REQUEST, LVM FOR A RETURN CALL

## 2020-02-24 ENCOUNTER — Other Ambulatory Visit: Payer: Self-pay | Admitting: Radiation Oncology

## 2020-02-24 ENCOUNTER — Ambulatory Visit: Payer: Medicaid Other | Admitting: Radiation Oncology

## 2020-02-24 DIAGNOSIS — Z171 Estrogen receptor negative status [ER-]: Secondary | ICD-10-CM

## 2020-02-24 DIAGNOSIS — C50311 Malignant neoplasm of lower-inner quadrant of right female breast: Secondary | ICD-10-CM

## 2020-02-24 NOTE — Progress Notes (Signed)
Radiation Oncology         [phone] ________________________________  Name: Kara Pittman MRN: [id number]  Date: 02/23/2020  DOB: [birth year]  Follow-Up Visit Note  CC: [name], MD  [name], MD    ICD-10-CM   1. Malignant neoplasm of lower-inner quadrant of right breast of female, estrogen receptor negative ([place])  C50.311    Z17.1     Diagnosis: StageIIB (ypT4b, pN0)RightBreast LIQ,InvasiveMetaplasticCarcinoma, ER-/ PR-/ Her2-,Grade 3(now metastatic)  Interval Since Last Radiation: Six months, two weeks, and three days  Radiation Treatment Dates: 06/18/2019 through 08/06/2019              Site Technique Total Dose (Gy) Dose per Fx (Gy) Completed Fx Beam Energies  Chest Wall, Right: CW_Rt_Bst Electron 12/12 2 6/6 9E   Chest Wall, Right: CW_Rt_Bst_e- Electron 4/4 2 2/2 9E   Chest Wall, Right: CW_Rt 3D 50/50 2 25/25 6X   Chest Wall, Right: CW_Rt_SCV 3D 50/50 2 25/25 6X    Cumulative dose to the largest nodules along the right chest wall of 66 Gyin 33 fractions along with radiosensitizing chemotherapy.   Narrative:  The patient returns today for  follow-up. Since her last visit, she was admitted to the hospital from 09/27/19 - 10/02/19 for sepsis due to gram-negative UTI and open wound of right chest wall.    She was last seen by Dr. Wetzel Bjornstad, medical oncologist at [facility], on 02/08/2020. She continues to do well on Pembrolizumab.  Scans have shown a dramatic response to this single agent.  The patient's performance status has improved significantly since I last saw her.  However over the past few weeks the patient has had intense joint pain.  Is unclear if this was related to the patient's immunotherapy with pembrolizumab or possibly related to autoimmune situation.  Dr. Wetzel Bjornstad has recommended a rheumatologic referral and I have placed urgent referral to [place]  for this issue.  On review of systems, she reports joint pain issues  as above, prior to this new issue the patient was out walking approximately 4 to 5 miles a day with her fianc. She denies bleeding from the right chest wall area.  She continues to require intensive dressing applications due to persistent wound breakdown along the right chest wall.  the patient reports only 3 areas that have not completely healed over.        ALLERGIES:  is allergic to hydrocodone-acetaminophen, morphine and related, and vancomycin.  Meds: Current Outpatient Medications  Medication Sig Dispense Refill  . acetaminophen (TYLENOL) 500 MG tablet Take 500-1,000 mg by mouth every 6 (six) hours as needed for mild pain or headache.    Marland Kitchen buPROPion (WELLBUTRIN XL) 150 MG 24 hr tablet Take 1 tablet by mouth daily 90 tablet 3  . ibuprofen (ADVIL) 200 MG tablet Take 200 mg by mouth every 6 (six) hours as needed for moderate pain.    Marland Kitchen lidocaine-prilocaine (EMLA) cream Apply to affected area once 30 g 3  . SYNTHROID 75 MCG tablet Take 1 tablet (75 mcg total) by mouth daily before breakfast. 90 tablet 3  . CALCIUM PO Take 1 tablet by mouth daily as needed (low calcium).  (Patient not taking: Reported on 02/23/2020)    . chlorpheniramine-HYDROcodone (TUSSIONEX) 10-8 MG/5ML SUER Take 5 mLs by mouth every 12 (twelve) hours as needed for cough. (Patient not taking: No sig reported) 115 mL 0  . docusate sodium (COLACE) 100 MG capsule Take 1 capsule (100 mg total)  by mouth 2 (two) times daily. (Patient not taking: No sig reported) 20 capsule 0  . gabapentin (NEURONTIN) 300 MG capsule Take 1 capsule (300 mg total) by mouth at bedtime. (Patient not taking: No sig reported) 30 capsule 1  . LORazepam (ATIVAN) 0.5 MG tablet Take 1 tablet (0.5 mg total) by mouth at bedtime as needed for sleep. (Patient not taking: No sig reported) 30 tablet 0  . methocarbamol (ROBAXIN) 500 MG tablet Take 1 tablet (500 mg total) by mouth every 6 (six) hours as needed for muscle spasms. (Patient not taking: No sig reported) 20  tablet 0  . Multiple Vitamins-Calcium (ONE-A-DAY WOMENS FORMULA) TABS Take 1 tablet by mouth daily as needed (nutrition).  (Patient not taking: No sig reported)    . ondansetron (ZOFRAN) 8 MG tablet Take 8 mg by mouth every 8 (eight) hours as needed. (Patient not taking: No sig reported)    . oxyCODONE (OXYCONTIN) 20 mg 12 hr tablet Take 1 tablet (20 mg total) by mouth every 12 (twelve) hours. (Patient not taking: No sig reported) 30 tablet 0  . Oxycodone HCl 10 MG TABS Take 10 mg by mouth every 3 (three) hours as needed (pain).  (Patient not taking: No sig reported)    . pantoprazole (PROTONIX) 40 MG tablet TAKE 1 TABLET BY MOUTH ONCE DAILY (Patient not taking: No sig reported) 30 tablet 0  . phenazopyridine (PYRIDIUM) 200 MG tablet Take 1 tablet (200 mg total) by mouth 3 (three) times daily as needed for pain. (Patient not taking: No sig reported) 10 tablet 1  . prochlorperazine (COMPAZINE) 10 MG tablet TAKE 1 TABLET BY MOUTH EVERY 6 HOURS AS NEEDED FOR NAUSEA & VOMITING (Patient not taking: No sig reported) 30 tablet 1  . promethazine (PHENERGAN) 25 MG suppository Place 1 suppository (25 mg total) rectally every 6 (six) hours as needed for nausea or vomiting. (Patient not taking: No sig reported) 12 each 1  . promethazine (PHENERGAN) 25 MG tablet Take 1 tablet (25 mg total) by mouth every 6 (six) hours as needed for nausea or vomiting. (Patient not taking: No sig reported) 30 tablet 1  . sucralfate (CARAFATE) 1 GM/10ML suspension Take 1 g by mouth 4 (four) times daily as needed (prevent ulcers).  (Patient not taking: No sig reported)     No current facility-administered medications for this encounter.    Physical Findings: The patient is in no acute distress. Patient is alert and oriented.  height is 5' 5"  (1.651 m) and weight is 147 lb 6 oz (66.8 kg). Her temporal temperature is 97.7 F (36.5 C). Her blood pressure is 107/79 and her pulse is 92. Her respiration is 18 and oxygen saturation is  100%.   Lungs are clear to auscultation bilaterally. Heart has regular rate and rhythm. No palpable cervical, supraclavicular adenopathy.  The patient appears to have a 1.5 cm palpable lymph node in the left axilla.  Abdomen soft, non-tender, normal bowel sounds. The right chest wall area dramatic improvement since my last exam. she continues to have 3 areas that have not healed.  The largest is in the upper chest/clavicular region where her largest tumor was located.  This area shows some necrotic tissue.  2 small areas are located more inferior along the chest wall area.  No signs of bleeding Lab Findings: Lab Results  Component Value Date   WBC 10.6 (H) 10/02/2019   HGB 8.5 (L) 10/02/2019   HCT 28.7 (L) 10/02/2019   MCV 95.7 10/02/2019  PLT 386 10/02/2019    Radiographic Findings: No results found.  Impression: StageIIB (ypT4b, pN0)RightBreast LIQ,InvasiveMetaplasticCarcinoma, ER-/ PR-/ Her2-,Grade 3(now metastatic)  As above the patient has had a dramatic response to her immunotherapy based on  imaging at [facility].  An urgent referral has been placed to rheumatology Dr. Bo Merino to further investigate the patient's joint pain.  Plan: The patient will continue close follow-up at [facility] and continue on immunotherapy.  She will periodically follow-up in radiation oncology to help assist with obtaining dressings and application material for this issue.    ____________________________________   Blair Promise, PhD, MD  This document serves as a record of services personally performed by [name], MD. It was created on his behalf by Clerance Lav, a trained medical scribe. The creation of this record is based on the scribe's personal observations and the provider's statements to them. This document has been checked and approved by the attending provider.

## 2020-02-25 ENCOUNTER — Telehealth: Payer: Self-pay | Admitting: Radiation Oncology

## 2020-02-25 NOTE — Telephone Encounter (Signed)
Phoned patient. Explained the dressing supplies she requested are ready for pick up at the entry desk of the cancer center. She verbalized understanding and appreciation.

## 2020-02-26 DIAGNOSIS — Z171 Estrogen receptor negative status [ER-]: Secondary | ICD-10-CM | POA: Diagnosis not present

## 2020-02-26 DIAGNOSIS — C50811 Malignant neoplasm of overlapping sites of right female breast: Secondary | ICD-10-CM | POA: Diagnosis not present

## 2020-02-26 DIAGNOSIS — C7951 Secondary malignant neoplasm of bone: Secondary | ICD-10-CM | POA: Diagnosis not present

## 2020-02-26 DIAGNOSIS — R918 Other nonspecific abnormal finding of lung field: Secondary | ICD-10-CM | POA: Diagnosis not present

## 2020-02-26 DIAGNOSIS — K769 Liver disease, unspecified: Secondary | ICD-10-CM | POA: Diagnosis not present

## 2020-02-29 ENCOUNTER — Other Ambulatory Visit: Payer: Self-pay | Admitting: *Deleted

## 2020-02-29 DIAGNOSIS — Z5112 Encounter for antineoplastic immunotherapy: Secondary | ICD-10-CM | POA: Diagnosis not present

## 2020-02-29 DIAGNOSIS — Z79899 Other long term (current) drug therapy: Secondary | ICD-10-CM | POA: Diagnosis not present

## 2020-02-29 DIAGNOSIS — M546 Pain in thoracic spine: Secondary | ICD-10-CM | POA: Diagnosis not present

## 2020-02-29 DIAGNOSIS — K7689 Other specified diseases of liver: Secondary | ICD-10-CM | POA: Diagnosis not present

## 2020-02-29 DIAGNOSIS — M25539 Pain in unspecified wrist: Secondary | ICD-10-CM | POA: Diagnosis not present

## 2020-02-29 DIAGNOSIS — M25512 Pain in left shoulder: Secondary | ICD-10-CM | POA: Diagnosis not present

## 2020-02-29 DIAGNOSIS — F419 Anxiety disorder, unspecified: Secondary | ICD-10-CM | POA: Diagnosis not present

## 2020-02-29 DIAGNOSIS — R911 Solitary pulmonary nodule: Secondary | ICD-10-CM | POA: Diagnosis not present

## 2020-02-29 DIAGNOSIS — Z923 Personal history of irradiation: Secondary | ICD-10-CM | POA: Diagnosis not present

## 2020-02-29 DIAGNOSIS — F32A Depression, unspecified: Secondary | ICD-10-CM | POA: Diagnosis not present

## 2020-02-29 DIAGNOSIS — Z171 Estrogen receptor negative status [ER-]: Secondary | ICD-10-CM | POA: Diagnosis not present

## 2020-02-29 DIAGNOSIS — C50311 Malignant neoplasm of lower-inner quadrant of right female breast: Secondary | ICD-10-CM

## 2020-02-29 DIAGNOSIS — C7951 Secondary malignant neoplasm of bone: Secondary | ICD-10-CM | POA: Diagnosis not present

## 2020-02-29 DIAGNOSIS — C50811 Malignant neoplasm of overlapping sites of right female breast: Secondary | ICD-10-CM | POA: Diagnosis not present

## 2020-02-29 DIAGNOSIS — R591 Generalized enlarged lymph nodes: Secondary | ICD-10-CM | POA: Diagnosis not present

## 2020-03-01 ENCOUNTER — Ambulatory Visit: Payer: Medicaid Other | Admitting: Radiation Oncology

## 2020-03-07 ENCOUNTER — Other Ambulatory Visit: Payer: Self-pay | Admitting: Family Medicine

## 2020-03-07 DIAGNOSIS — F4321 Adjustment disorder with depressed mood: Secondary | ICD-10-CM

## 2020-03-07 MED FILL — LIDOCAINE-PRILOCAINE CREAM: 2.5-2.5 | 14 days supply | Qty: 30 | Fill #1

## 2020-03-07 MED FILL — buPROPion HCL ER (XL) 150 M: 150 | 90 days supply | Qty: 90 | Fill #0

## 2020-03-09 ENCOUNTER — Other Ambulatory Visit (HOSPITAL_COMMUNITY): Payer: Self-pay | Admitting: Nurse Practitioner

## 2020-03-09 MED FILL — DIFFERIN 0.1% CREAM: 0.1 | 30 days supply | Qty: 45 | Fill #0

## 2020-03-17 DIAGNOSIS — C50811 Malignant neoplasm of overlapping sites of right female breast: Secondary | ICD-10-CM | POA: Diagnosis not present

## 2020-03-17 DIAGNOSIS — Z171 Estrogen receptor negative status [ER-]: Secondary | ICD-10-CM | POA: Diagnosis not present

## 2020-03-17 DIAGNOSIS — G959 Disease of spinal cord, unspecified: Secondary | ICD-10-CM | POA: Diagnosis not present

## 2020-03-21 ENCOUNTER — Other Ambulatory Visit (HOSPITAL_COMMUNITY): Payer: Self-pay | Admitting: Internal Medicine

## 2020-03-21 DIAGNOSIS — Z923 Personal history of irradiation: Secondary | ICD-10-CM | POA: Diagnosis not present

## 2020-03-21 DIAGNOSIS — C50811 Malignant neoplasm of overlapping sites of right female breast: Secondary | ICD-10-CM | POA: Diagnosis not present

## 2020-03-21 DIAGNOSIS — F32A Depression, unspecified: Secondary | ICD-10-CM | POA: Diagnosis not present

## 2020-03-21 DIAGNOSIS — Z171 Estrogen receptor negative status [ER-]: Secondary | ICD-10-CM | POA: Diagnosis not present

## 2020-03-21 DIAGNOSIS — Z79899 Other long term (current) drug therapy: Secondary | ICD-10-CM | POA: Diagnosis not present

## 2020-03-21 DIAGNOSIS — F419 Anxiety disorder, unspecified: Secondary | ICD-10-CM | POA: Diagnosis not present

## 2020-03-21 DIAGNOSIS — M546 Pain in thoracic spine: Secondary | ICD-10-CM | POA: Diagnosis not present

## 2020-03-21 MED FILL — DEXAMETHASONE 4 MG TABLET: 4 | 15 days supply | Qty: 30 | Fill #0

## 2020-03-21 MED FILL — ONDANSETRON HCL 8 MG TABLET: 8 | 30 days supply | Qty: 60 | Fill #0

## 2020-03-21 MED FILL — PROCHLORPERAZINE 10 MG TAB: 10 | 15 days supply | Qty: 60 | Fill #0

## 2020-03-22 ENCOUNTER — Telehealth: Payer: Self-pay | Admitting: Medical Oncology

## 2020-03-22 NOTE — Telephone Encounter (Signed)
UPBEAT: 12 month assessment past due.  Outgoing call to patient. VM full and unable to leave message for patient. I sent patient an email regarding her 12 month study assessment is past due. Patient did not call me back last month regarding her decision for this visit. Sent email informing her of a few options. Asked patient to return call at her earliest convenience, call back number provided. Patient thanked for her time and continued contribution to the study.  [name], RN, BSN, [facility] Clinical Research 03/22/2020 2:37 PM

## 2020-03-28 ENCOUNTER — Other Ambulatory Visit (HOSPITAL_COMMUNITY): Payer: Self-pay | Admitting: Internal Medicine

## 2020-03-28 DIAGNOSIS — R200 Anesthesia of skin: Secondary | ICD-10-CM | POA: Diagnosis not present

## 2020-03-28 DIAGNOSIS — C792 Secondary malignant neoplasm of skin: Secondary | ICD-10-CM | POA: Diagnosis not present

## 2020-03-28 DIAGNOSIS — C7801 Secondary malignant neoplasm of right lung: Secondary | ICD-10-CM | POA: Diagnosis not present

## 2020-03-28 DIAGNOSIS — Z171 Estrogen receptor negative status [ER-]: Secondary | ICD-10-CM | POA: Diagnosis not present

## 2020-03-28 DIAGNOSIS — C50811 Malignant neoplasm of overlapping sites of right female breast: Secondary | ICD-10-CM | POA: Diagnosis not present

## 2020-03-28 DIAGNOSIS — R590 Localized enlarged lymph nodes: Secondary | ICD-10-CM | POA: Diagnosis not present

## 2020-03-28 DIAGNOSIS — Z79899 Other long term (current) drug therapy: Secondary | ICD-10-CM | POA: Diagnosis not present

## 2020-03-28 DIAGNOSIS — Z5112 Encounter for antineoplastic immunotherapy: Secondary | ICD-10-CM | POA: Diagnosis not present

## 2020-03-28 DIAGNOSIS — Z5111 Encounter for antineoplastic chemotherapy: Secondary | ICD-10-CM | POA: Diagnosis not present

## 2020-03-28 DIAGNOSIS — C779 Secondary and unspecified malignant neoplasm of lymph node, unspecified: Secondary | ICD-10-CM | POA: Diagnosis not present

## 2020-03-28 DIAGNOSIS — C7802 Secondary malignant neoplasm of left lung: Secondary | ICD-10-CM | POA: Diagnosis not present

## 2020-03-28 DIAGNOSIS — M546 Pain in thoracic spine: Secondary | ICD-10-CM | POA: Diagnosis not present

## 2020-03-28 DIAGNOSIS — C7951 Secondary malignant neoplasm of bone: Secondary | ICD-10-CM | POA: Diagnosis not present

## 2020-03-28 DIAGNOSIS — F32A Depression, unspecified: Secondary | ICD-10-CM | POA: Diagnosis not present

## 2020-03-28 DIAGNOSIS — Z9011 Acquired absence of right breast and nipple: Secondary | ICD-10-CM | POA: Diagnosis not present

## 2020-03-28 DIAGNOSIS — M79621 Pain in right upper arm: Secondary | ICD-10-CM | POA: Diagnosis not present

## 2020-03-28 DIAGNOSIS — Z515 Encounter for palliative care: Secondary | ICD-10-CM | POA: Diagnosis not present

## 2020-03-28 DIAGNOSIS — G893 Neoplasm related pain (acute) (chronic): Secondary | ICD-10-CM | POA: Diagnosis not present

## 2020-03-28 DIAGNOSIS — Z923 Personal history of irradiation: Secondary | ICD-10-CM | POA: Diagnosis not present

## 2020-03-28 DIAGNOSIS — F419 Anxiety disorder, unspecified: Secondary | ICD-10-CM | POA: Diagnosis not present

## 2020-03-28 MED FILL — OXYCODONE-APAP 5-325MG: 5-325 | 22 days supply | Qty: 90 | Fill #0

## 2020-03-28 MED FILL — CELECOXIB 200 MG CAP: 200 | 30 days supply | Qty: 30 | Fill #0

## 2020-04-04 DIAGNOSIS — C50811 Malignant neoplasm of overlapping sites of right female breast: Secondary | ICD-10-CM | POA: Diagnosis not present

## 2020-04-04 DIAGNOSIS — Z5112 Encounter for antineoplastic immunotherapy: Secondary | ICD-10-CM | POA: Diagnosis not present

## 2020-04-04 DIAGNOSIS — Z79899 Other long term (current) drug therapy: Secondary | ICD-10-CM | POA: Diagnosis not present

## 2020-04-04 DIAGNOSIS — Z171 Estrogen receptor negative status [ER-]: Secondary | ICD-10-CM | POA: Diagnosis not present

## 2020-04-08 ENCOUNTER — Ambulatory Visit: Payer: Medicaid Other | Admitting: Internal Medicine

## 2020-04-11 DIAGNOSIS — G893 Neoplasm related pain (acute) (chronic): Secondary | ICD-10-CM | POA: Diagnosis not present

## 2020-04-11 DIAGNOSIS — Z515 Encounter for palliative care: Secondary | ICD-10-CM | POA: Diagnosis not present

## 2020-04-11 DIAGNOSIS — F32A Depression, unspecified: Secondary | ICD-10-CM | POA: Diagnosis not present

## 2020-04-11 DIAGNOSIS — C7951 Secondary malignant neoplasm of bone: Secondary | ICD-10-CM | POA: Diagnosis not present

## 2020-04-11 DIAGNOSIS — F419 Anxiety disorder, unspecified: Secondary | ICD-10-CM | POA: Diagnosis not present

## 2020-04-14 DIAGNOSIS — C7951 Secondary malignant neoplasm of bone: Secondary | ICD-10-CM | POA: Diagnosis not present

## 2020-04-14 DIAGNOSIS — C792 Secondary malignant neoplasm of skin: Secondary | ICD-10-CM | POA: Diagnosis not present

## 2020-04-15 DIAGNOSIS — C50811 Malignant neoplasm of overlapping sites of right female breast: Secondary | ICD-10-CM | POA: Diagnosis not present

## 2020-04-15 DIAGNOSIS — Z5112 Encounter for antineoplastic immunotherapy: Secondary | ICD-10-CM | POA: Diagnosis not present

## 2020-04-15 DIAGNOSIS — Z79899 Other long term (current) drug therapy: Secondary | ICD-10-CM | POA: Diagnosis not present

## 2020-04-15 DIAGNOSIS — Z171 Estrogen receptor negative status [ER-]: Secondary | ICD-10-CM | POA: Diagnosis not present

## 2020-04-16 MED FILL — DIFFERIN 0.1% CREAM: 0.1 | 30 days supply | Qty: 45 | Fill #1

## 2020-04-16 MED FILL — PANTOPRAZOLE SOD DR 40 MG T: 40 | 30 days supply | Qty: 30 | Fill #1

## 2020-04-16 MED FILL — LIDOCAINE-PRILOCAINE CREAM: 2.5-2.5 | 14 days supply | Qty: 30 | Fill #2

## 2020-04-16 MED FILL — SYNTHROID 75 MCG TABLET: 75 | 90 days supply | Qty: 90 | Fill #2

## 2020-04-16 MED FILL — ONDANSETRON HCL 8 MG TABLET: 8 | 30 days supply | Qty: 60 | Fill #1

## 2020-04-17 ENCOUNTER — Other Ambulatory Visit (HOSPITAL_COMMUNITY): Payer: Self-pay | Admitting: Internal Medicine

## 2020-04-19 ENCOUNTER — Telehealth: Payer: Self-pay | Admitting: Medical Oncology

## 2020-04-19 NOTE — Telephone Encounter (Signed)
Incoming phone call: 1323 regarding UPBEAT  Patient called to inform me that she had received my letter regarding the UPBEAT study. I tried to call patient and was unable to leave a vm due to her vm is full. I also sent patient an email regarding study and how she wished to proceed since I was unable to get a response from her regarding scheduling her 12 month assessment. After no response to email, patient was sent a letter, March 4th 2022,  informing her of the possible options with continued study participation and to see if she wished to continue. Patient was informed that it is completley understandable if she wished to withdraw from the study, since she is now receiving new treatments at [facility] for progression. Patient was also offered the option to transfer her study participation to [facility], per study, since [place] is also open there and it may be easier for her. I informed her that due to the circumstances, I was informed by the study that she can complete the questionnaire portion only and we can assess at the 24 month visit and see how she wished to continue at that point.  Patient informed me that she wished to continue her participation here at [facility] and would very much like to see the study through. Patient also stated that she would be happy to complete the questionnaires. I explained to patient that I would need her consent to send her the study questionnaires via email and informed her of study having a new addendum with a new questionnaire that have been added at the 24 month visit and she can complete the study questionnaires on line. I  reviewed with patient the new Consent Addendum Form protocol version date 09/10/19 for the new 24 month study questionnaire for Social Determinants of Health. Once reviewed with patient, patient gave her verbal consent and provided her email address for participation to complete study questionnaires online.  Patient was informed to expect an email from the study, the  email will come from [employer] providing a link for her to access the questionnaires.  Patient asked me if I could get a message to Dr. Clabe Seal nurse regarding dressing supplies she needs. Patient was informed that I will call RT at the end of this call. I did remind patient that her vm box is full and if RT calls her, they would not be able to leave a vm, patient stated she would keep her phone close.   I thanked patient for her time and her continued support of study and how much we appreciate her and her time at this difficult time in her life. I encouraged her to call me with any questions or concerns she may have.   VM left with RN Sam Presnell in RT regarding patient's request for dressing supplies, detailed description left of what dressing supplies patient was asking for.  [name], RN, BSN, [facility] Clinical Research 04/19/2020 2:49 PM

## 2020-04-21 DIAGNOSIS — C792 Secondary malignant neoplasm of skin: Secondary | ICD-10-CM | POA: Diagnosis not present

## 2020-04-21 DIAGNOSIS — C7951 Secondary malignant neoplasm of bone: Secondary | ICD-10-CM | POA: Diagnosis not present

## 2020-04-22 DIAGNOSIS — Z171 Estrogen receptor negative status [ER-]: Secondary | ICD-10-CM | POA: Diagnosis not present

## 2020-04-22 DIAGNOSIS — M47812 Spondylosis without myelopathy or radiculopathy, cervical region: Secondary | ICD-10-CM | POA: Diagnosis not present

## 2020-04-22 DIAGNOSIS — C50911 Malignant neoplasm of unspecified site of right female breast: Secondary | ICD-10-CM | POA: Diagnosis not present

## 2020-04-22 DIAGNOSIS — C50811 Malignant neoplasm of overlapping sites of right female breast: Secondary | ICD-10-CM | POA: Diagnosis not present

## 2020-04-22 DIAGNOSIS — M4802 Spinal stenosis, cervical region: Secondary | ICD-10-CM | POA: Diagnosis not present

## 2020-04-22 DIAGNOSIS — C7951 Secondary malignant neoplasm of bone: Secondary | ICD-10-CM | POA: Diagnosis not present

## 2020-04-22 DIAGNOSIS — M4312 Spondylolisthesis, cervical region: Secondary | ICD-10-CM | POA: Diagnosis not present

## 2020-04-26 DIAGNOSIS — C7951 Secondary malignant neoplasm of bone: Secondary | ICD-10-CM | POA: Diagnosis not present

## 2020-04-26 DIAGNOSIS — C792 Secondary malignant neoplasm of skin: Secondary | ICD-10-CM | POA: Diagnosis not present

## 2020-04-27 DIAGNOSIS — C7951 Secondary malignant neoplasm of bone: Secondary | ICD-10-CM | POA: Diagnosis not present

## 2020-04-27 DIAGNOSIS — C792 Secondary malignant neoplasm of skin: Secondary | ICD-10-CM | POA: Diagnosis not present

## 2020-04-28 ENCOUNTER — Telehealth: Payer: Self-pay | Admitting: *Deleted

## 2020-04-28 NOTE — Telephone Encounter (Addendum)
Upbeat 12 month-Called and spoke to patient to inform her I was mailing her gift card today. Patient confirmed she had not been admitted to the hospital for any heart issues. Patient also confirmed her address. Patient was thanked for her time.

## 2020-05-02 DIAGNOSIS — F32A Depression, unspecified: Secondary | ICD-10-CM | POA: Diagnosis not present

## 2020-05-02 DIAGNOSIS — M899 Disorder of bone, unspecified: Secondary | ICD-10-CM | POA: Diagnosis not present

## 2020-05-02 DIAGNOSIS — Z5112 Encounter for antineoplastic immunotherapy: Secondary | ICD-10-CM | POA: Diagnosis not present

## 2020-05-02 DIAGNOSIS — Z9011 Acquired absence of right breast and nipple: Secondary | ICD-10-CM | POA: Diagnosis not present

## 2020-05-02 DIAGNOSIS — Z171 Estrogen receptor negative status [ER-]: Secondary | ICD-10-CM | POA: Diagnosis not present

## 2020-05-02 DIAGNOSIS — Z923 Personal history of irradiation: Secondary | ICD-10-CM | POA: Diagnosis not present

## 2020-05-02 DIAGNOSIS — C50811 Malignant neoplasm of overlapping sites of right female breast: Secondary | ICD-10-CM | POA: Diagnosis not present

## 2020-05-02 DIAGNOSIS — F419 Anxiety disorder, unspecified: Secondary | ICD-10-CM | POA: Diagnosis not present

## 2020-05-09 DIAGNOSIS — C50811 Malignant neoplasm of overlapping sites of right female breast: Secondary | ICD-10-CM | POA: Diagnosis not present

## 2020-05-09 DIAGNOSIS — Z5112 Encounter for antineoplastic immunotherapy: Secondary | ICD-10-CM | POA: Diagnosis not present

## 2020-05-09 DIAGNOSIS — Z171 Estrogen receptor negative status [ER-]: Secondary | ICD-10-CM | POA: Diagnosis not present

## 2020-05-12 ENCOUNTER — Other Ambulatory Visit: Payer: Self-pay

## 2020-05-12 ENCOUNTER — Other Ambulatory Visit (HOSPITAL_COMMUNITY): Payer: Self-pay

## 2020-05-12 MED FILL — Pantoprazole Sodium EC Tab 40 MG (Base Equiv): ORAL | 30 days supply | Qty: 30 | Fill #0 | Status: AC

## 2020-05-12 MED FILL — Bupropion HCl Tab ER 24HR 150 MG: ORAL | 90 days supply | Qty: 90 | Fill #0 | Status: AC

## 2020-05-13 ENCOUNTER — Other Ambulatory Visit (HOSPITAL_COMMUNITY): Payer: Self-pay

## 2020-05-13 ENCOUNTER — Other Ambulatory Visit: Payer: Self-pay

## 2020-05-17 ENCOUNTER — Other Ambulatory Visit (HOSPITAL_COMMUNITY): Payer: Self-pay

## 2020-05-18 ENCOUNTER — Other Ambulatory Visit (HOSPITAL_COMMUNITY): Payer: Self-pay

## 2020-05-20 ENCOUNTER — Other Ambulatory Visit: Payer: Self-pay

## 2020-05-20 ENCOUNTER — Other Ambulatory Visit (HOSPITAL_COMMUNITY): Payer: Self-pay

## 2020-05-20 MED ORDER — CLINDAMYCIN PHOSPHATE 1 % EX SOLN
Freq: Two times a day (BID) | CUTANEOUS | 1 refills | Status: DC
  Filled 2020-05-20 (×2): qty 30, 30d supply, fill #0

## 2020-05-20 MED ORDER — DOXYCYCLINE MONOHYDRATE 100 MG PO CAPS
ORAL_CAPSULE | ORAL | 0 refills | Status: DC
  Filled 2020-05-20 (×2): qty 20, 10d supply, fill #0

## 2020-05-23 ENCOUNTER — Other Ambulatory Visit (HOSPITAL_COMMUNITY): Payer: Self-pay

## 2020-05-23 ENCOUNTER — Other Ambulatory Visit: Payer: Self-pay

## 2020-05-23 MED ORDER — HYDROCORTISONE 1 % EX CREA: TOPICAL_CREAM | Freq: Two times a day (BID) | CUTANEOUS | 0 refills | Status: DC

## 2020-05-23 MED ORDER — LORATADINE 10 MG PO TABS
10.0000 mg | ORAL_TABLET | Freq: Every day | ORAL | 1 refills | Status: DC
  Filled 2020-05-23: qty 30, 30d supply, fill #0
  Filled 2020-06-14: qty 30, 30d supply, fill #1

## 2020-05-24 ENCOUNTER — Encounter (HOSPITAL_BASED_OUTPATIENT_CLINIC_OR_DEPARTMENT_OTHER): Payer: Self-pay | Admitting: *Deleted

## 2020-05-24 ENCOUNTER — Other Ambulatory Visit: Payer: Self-pay

## 2020-05-24 ENCOUNTER — Emergency Department (HOSPITAL_BASED_OUTPATIENT_CLINIC_OR_DEPARTMENT_OTHER)
Admission: EM | Admit: 2020-05-24 | Discharge: 2020-05-24 | Disposition: A | Discharge status: Home / Self Care | Payer: Medicaid Other | Attending: Emergency Medicine | Admitting: Emergency Medicine

## 2020-05-24 ENCOUNTER — Other Ambulatory Visit (HOSPITAL_COMMUNITY): Payer: Self-pay

## 2020-05-24 DIAGNOSIS — Z20822 Contact with and (suspected) exposure to covid-19: Secondary | ICD-10-CM | POA: Insufficient documentation

## 2020-05-24 DIAGNOSIS — E039 Hypothyroidism, unspecified: Secondary | ICD-10-CM | POA: Diagnosis not present

## 2020-05-24 DIAGNOSIS — Z85820 Personal history of malignant melanoma of skin: Secondary | ICD-10-CM | POA: Insufficient documentation

## 2020-05-24 DIAGNOSIS — Z853 Personal history of malignant neoplasm of breast: Secondary | ICD-10-CM | POA: Insufficient documentation

## 2020-05-24 DIAGNOSIS — J029 Acute pharyngitis, unspecified: Secondary | ICD-10-CM | POA: Diagnosis not present

## 2020-05-24 DIAGNOSIS — J45909 Unspecified asthma, uncomplicated: Secondary | ICD-10-CM | POA: Diagnosis not present

## 2020-05-24 DIAGNOSIS — Z79899 Other long term (current) drug therapy: Secondary | ICD-10-CM | POA: Diagnosis not present

## 2020-05-24 LAB — RESPIRATORY PANEL BY RT PCR (FLU A&B, COVID)
Influenza A by PCR: NEGATIVE
Influenza B by PCR: NEGATIVE
SARS Coronavirus 2 by RT PCR: NEGATIVE

## 2020-05-24 LAB — GROUP A STREP BY PCR: Group A Strep by PCR: NOT DETECTED

## 2020-05-24 MED ORDER — CELECOXIB 200 MG PO CAPS
200.0000 mg | ORAL_CAPSULE | Freq: Every day | ORAL | 0 refills | Status: DC
  Filled 2020-05-24: qty 30, 30d supply, fill #0

## 2020-05-24 MED ORDER — LIDOCAINE VISCOUS HCL 2 % MT SOLN
15.0000 mL | Freq: Once | OROMUCOSAL | Status: AC
  Administered 2020-05-24: 15 mL via OROMUCOSAL
  Filled 2020-05-24: qty 15

## 2020-05-24 NOTE — ED Notes (Signed)
Visual inspection notes no redness or obvious swelling of the throat area, uvula appears nml. Speech WNL, tracheal sounds nml
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
## 2020-05-24 NOTE — ED Triage Notes (Signed)
Presents with sore throat, states noted "bumps" has presented on her face. Was prescribed Doxycyline, has helped some. Yesterday awoke with sore throat, a lot of sneezing. Has pain with swallowing. Has utilized throat lozenges to help with discomfort.

## 2020-05-25 ENCOUNTER — Other Ambulatory Visit (HOSPITAL_COMMUNITY): Payer: Self-pay

## 2020-05-25 MED ORDER — ADAPALENE 0.1 % EX CREA
TOPICAL_CREAM | CUTANEOUS | 1 refills | Status: DC
  Filled 2020-05-25: qty 45, 30d supply, fill #0

## 2020-05-26 ENCOUNTER — Other Ambulatory Visit (HOSPITAL_COMMUNITY): Payer: Self-pay

## 2020-05-26 MED ORDER — AMOXICILLIN-POT CLAVULANATE 875-125 MG PO TABS
ORAL_TABLET | ORAL | 0 refills | Status: DC
  Filled 2020-05-26: qty 20, 10d supply, fill #0

## 2020-05-27 ENCOUNTER — Other Ambulatory Visit (HOSPITAL_COMMUNITY): Payer: Self-pay

## 2020-06-02 ENCOUNTER — Other Ambulatory Visit (HOSPITAL_COMMUNITY): Payer: Self-pay

## 2020-06-08 DIAGNOSIS — C50919 Malignant neoplasm of unspecified site of unspecified female breast: Secondary | ICD-10-CM | POA: Diagnosis not present

## 2020-06-08 DIAGNOSIS — Z171 Estrogen receptor negative status [ER-]: Secondary | ICD-10-CM | POA: Diagnosis not present

## 2020-06-08 DIAGNOSIS — Z5111 Encounter for antineoplastic chemotherapy: Secondary | ICD-10-CM | POA: Diagnosis not present

## 2020-06-08 DIAGNOSIS — C50811 Malignant neoplasm of overlapping sites of right female breast: Secondary | ICD-10-CM | POA: Diagnosis not present

## 2020-06-14 ENCOUNTER — Other Ambulatory Visit (HOSPITAL_COMMUNITY): Payer: Self-pay

## 2020-06-14 MED FILL — Lidocaine-Prilocaine Cream 2.5-2.5%: CUTANEOUS | 30 days supply | Qty: 30 | Fill #0 | Status: AC

## 2020-06-15 ENCOUNTER — Other Ambulatory Visit (HOSPITAL_COMMUNITY): Payer: Self-pay

## 2020-06-15 DIAGNOSIS — Z171 Estrogen receptor negative status [ER-]: Secondary | ICD-10-CM | POA: Diagnosis not present

## 2020-06-15 DIAGNOSIS — Z5111 Encounter for antineoplastic chemotherapy: Secondary | ICD-10-CM | POA: Diagnosis not present

## 2020-06-15 DIAGNOSIS — C50811 Malignant neoplasm of overlapping sites of right female breast: Secondary | ICD-10-CM | POA: Diagnosis not present

## 2020-06-15 MED ORDER — NYSTATIN 100000 UNIT/ML MT SUSP
OROMUCOSAL | 1 refills | Status: DC
  Filled 2020-06-15: qty 240, 5d supply, fill #0

## 2020-06-15 MED ORDER — CLINDAMYCIN PHOSPHATE 1 % EX GEL
Freq: Two times a day (BID) | CUTANEOUS | 1 refills | Status: DC
  Filled 2020-06-15 (×2): qty 60, 30d supply, fill #0

## 2020-06-17 ENCOUNTER — Other Ambulatory Visit (HOSPITAL_COMMUNITY): Payer: Self-pay

## 2020-06-24 ENCOUNTER — Other Ambulatory Visit (HOSPITAL_COMMUNITY): Payer: Self-pay

## 2020-06-24 DIAGNOSIS — Z171 Estrogen receptor negative status [ER-]: Secondary | ICD-10-CM | POA: Diagnosis not present

## 2020-06-24 DIAGNOSIS — R918 Other nonspecific abnormal finding of lung field: Secondary | ICD-10-CM | POA: Diagnosis not present

## 2020-06-24 DIAGNOSIS — C50811 Malignant neoplasm of overlapping sites of right female breast: Secondary | ICD-10-CM | POA: Diagnosis not present

## 2020-06-24 DIAGNOSIS — M899 Disorder of bone, unspecified: Secondary | ICD-10-CM | POA: Diagnosis not present

## 2020-06-24 DIAGNOSIS — C7951 Secondary malignant neoplasm of bone: Secondary | ICD-10-CM | POA: Diagnosis not present

## 2020-06-24 DIAGNOSIS — K8689 Other specified diseases of pancreas: Secondary | ICD-10-CM | POA: Diagnosis not present

## 2020-06-24 DIAGNOSIS — R911 Solitary pulmonary nodule: Secondary | ICD-10-CM | POA: Diagnosis not present

## 2020-06-24 DIAGNOSIS — M898X8 Other specified disorders of bone, other site: Secondary | ICD-10-CM | POA: Diagnosis not present

## 2020-06-24 DIAGNOSIS — C787 Secondary malignant neoplasm of liver and intrahepatic bile duct: Secondary | ICD-10-CM | POA: Diagnosis not present

## 2020-06-24 DIAGNOSIS — R590 Localized enlarged lymph nodes: Secondary | ICD-10-CM | POA: Diagnosis not present

## 2020-06-27 ENCOUNTER — Other Ambulatory Visit (HOSPITAL_COMMUNITY): Payer: Self-pay

## 2020-06-27 DIAGNOSIS — M899 Disorder of bone, unspecified: Secondary | ICD-10-CM | POA: Diagnosis not present

## 2020-06-27 DIAGNOSIS — R197 Diarrhea, unspecified: Secondary | ICD-10-CM | POA: Diagnosis not present

## 2020-06-27 DIAGNOSIS — R918 Other nonspecific abnormal finding of lung field: Secondary | ICD-10-CM | POA: Diagnosis not present

## 2020-06-27 DIAGNOSIS — C7971 Secondary malignant neoplasm of right adrenal gland: Secondary | ICD-10-CM | POA: Diagnosis not present

## 2020-06-27 DIAGNOSIS — Z9011 Acquired absence of right breast and nipple: Secondary | ICD-10-CM | POA: Diagnosis not present

## 2020-06-27 DIAGNOSIS — Z5112 Encounter for antineoplastic immunotherapy: Secondary | ICD-10-CM | POA: Diagnosis not present

## 2020-06-27 DIAGNOSIS — Z79899 Other long term (current) drug therapy: Secondary | ICD-10-CM | POA: Diagnosis not present

## 2020-06-27 DIAGNOSIS — C787 Secondary malignant neoplasm of liver and intrahepatic bile duct: Secondary | ICD-10-CM | POA: Diagnosis not present

## 2020-06-27 DIAGNOSIS — F419 Anxiety disorder, unspecified: Secondary | ICD-10-CM | POA: Diagnosis not present

## 2020-06-27 DIAGNOSIS — Z923 Personal history of irradiation: Secondary | ICD-10-CM | POA: Diagnosis not present

## 2020-06-27 DIAGNOSIS — C7989 Secondary malignant neoplasm of other specified sites: Secondary | ICD-10-CM | POA: Diagnosis not present

## 2020-06-27 DIAGNOSIS — F32A Depression, unspecified: Secondary | ICD-10-CM | POA: Diagnosis not present

## 2020-06-27 DIAGNOSIS — C7951 Secondary malignant neoplasm of bone: Secondary | ICD-10-CM | POA: Diagnosis not present

## 2020-06-27 DIAGNOSIS — R21 Rash and other nonspecific skin eruption: Secondary | ICD-10-CM | POA: Diagnosis not present

## 2020-06-27 DIAGNOSIS — C7972 Secondary malignant neoplasm of left adrenal gland: Secondary | ICD-10-CM | POA: Diagnosis not present

## 2020-06-27 DIAGNOSIS — C50811 Malignant neoplasm of overlapping sites of right female breast: Secondary | ICD-10-CM | POA: Diagnosis not present

## 2020-06-27 DIAGNOSIS — Z171 Estrogen receptor negative status [ER-]: Secondary | ICD-10-CM | POA: Diagnosis not present

## 2020-06-27 DIAGNOSIS — R590 Localized enlarged lymph nodes: Secondary | ICD-10-CM | POA: Diagnosis not present

## 2020-06-27 MED ORDER — CELECOXIB 200 MG PO CAPS
200.0000 mg | ORAL_CAPSULE | Freq: Every day | ORAL | 0 refills | Status: DC
  Filled 2020-06-27: qty 30, 30d supply, fill #0

## 2020-06-28 ENCOUNTER — Other Ambulatory Visit (HOSPITAL_COMMUNITY): Payer: Self-pay

## 2020-06-29 ENCOUNTER — Other Ambulatory Visit (HOSPITAL_COMMUNITY): Payer: Self-pay

## 2020-06-29 MED ORDER — CELECOXIB 200 MG PO CAPS
200.0000 mg | ORAL_CAPSULE | Freq: Every day | ORAL | 0 refills | Status: DC
  Filled 2020-06-29 – 2020-07-28 (×2): qty 30, 30d supply, fill #0

## 2020-07-05 DIAGNOSIS — Z5112 Encounter for antineoplastic immunotherapy: Secondary | ICD-10-CM | POA: Diagnosis not present

## 2020-07-05 DIAGNOSIS — Z5111 Encounter for antineoplastic chemotherapy: Secondary | ICD-10-CM | POA: Diagnosis not present

## 2020-07-05 DIAGNOSIS — C50811 Malignant neoplasm of overlapping sites of right female breast: Secondary | ICD-10-CM | POA: Diagnosis not present

## 2020-07-05 DIAGNOSIS — Z171 Estrogen receptor negative status [ER-]: Secondary | ICD-10-CM | POA: Diagnosis not present

## 2020-07-14 ENCOUNTER — Other Ambulatory Visit (HOSPITAL_COMMUNITY): Payer: Self-pay

## 2020-07-14 MED FILL — Levothyroxine Sodium Tab 75 MCG: ORAL | 90 days supply | Qty: 90 | Fill #0 | Status: AC

## 2020-07-16 ENCOUNTER — Other Ambulatory Visit (HOSPITAL_COMMUNITY): Payer: Self-pay

## 2020-07-18 ENCOUNTER — Other Ambulatory Visit (HOSPITAL_COMMUNITY): Payer: Self-pay

## 2020-07-18 DIAGNOSIS — C50811 Malignant neoplasm of overlapping sites of right female breast: Secondary | ICD-10-CM | POA: Diagnosis not present

## 2020-07-18 DIAGNOSIS — C50911 Malignant neoplasm of unspecified site of right female breast: Secondary | ICD-10-CM | POA: Diagnosis not present

## 2020-07-18 DIAGNOSIS — Z171 Estrogen receptor negative status [ER-]: Secondary | ICD-10-CM | POA: Diagnosis not present

## 2020-07-18 MED ORDER — LIDOCAINE-PRILOCAINE 2.5-2.5 % EX CREA
TOPICAL_CREAM | CUTANEOUS | 2 refills | Status: DC
  Filled 2020-07-18: qty 30, 20d supply, fill #0

## 2020-07-18 MED ORDER — LORATADINE 10 MG PO TABS
10.0000 mg | ORAL_TABLET | Freq: Every day | ORAL | 1 refills | Status: DC
  Filled 2020-07-18: qty 30, 30d supply, fill #0
  Filled 2020-08-15: qty 30, 30d supply, fill #1

## 2020-07-18 MED ORDER — LIDOCAINE-PRILOCAINE 2.5-2.5 % EX CREA
TOPICAL_CREAM | CUTANEOUS | 6 refills | Status: DC
  Filled 2020-07-18: qty 30, 30d supply, fill #0

## 2020-07-18 MED ORDER — LORATADINE 10 MG PO TABS
10.0000 mg | ORAL_TABLET | Freq: Every day | ORAL | 6 refills | Status: DC
  Filled 2020-07-18: qty 30, 30d supply, fill #0

## 2020-07-18 MED ORDER — DOXYCYCLINE MONOHYDRATE 100 MG PO CAPS
100.0000 mg | ORAL_CAPSULE | Freq: Two times a day (BID) | ORAL | 0 refills | Status: DC
  Filled 2020-07-18: qty 14, 7d supply, fill #0

## 2020-07-27 DIAGNOSIS — Z171 Estrogen receptor negative status [ER-]: Secondary | ICD-10-CM | POA: Diagnosis not present

## 2020-07-27 DIAGNOSIS — C50811 Malignant neoplasm of overlapping sites of right female breast: Secondary | ICD-10-CM | POA: Diagnosis not present

## 2020-07-27 DIAGNOSIS — Z5112 Encounter for antineoplastic immunotherapy: Secondary | ICD-10-CM | POA: Diagnosis not present

## 2020-07-28 ENCOUNTER — Other Ambulatory Visit (HOSPITAL_COMMUNITY): Payer: Self-pay

## 2020-08-09 DIAGNOSIS — Z5111 Encounter for antineoplastic chemotherapy: Secondary | ICD-10-CM | POA: Diagnosis not present

## 2020-08-09 DIAGNOSIS — F419 Anxiety disorder, unspecified: Secondary | ICD-10-CM | POA: Diagnosis not present

## 2020-08-09 DIAGNOSIS — C50811 Malignant neoplasm of overlapping sites of right female breast: Secondary | ICD-10-CM | POA: Diagnosis not present

## 2020-08-09 DIAGNOSIS — Z923 Personal history of irradiation: Secondary | ICD-10-CM | POA: Diagnosis not present

## 2020-08-09 DIAGNOSIS — Z5112 Encounter for antineoplastic immunotherapy: Secondary | ICD-10-CM | POA: Diagnosis not present

## 2020-08-09 DIAGNOSIS — R21 Rash and other nonspecific skin eruption: Secondary | ICD-10-CM | POA: Diagnosis not present

## 2020-08-09 DIAGNOSIS — Z9011 Acquired absence of right breast and nipple: Secondary | ICD-10-CM | POA: Diagnosis not present

## 2020-08-09 DIAGNOSIS — C787 Secondary malignant neoplasm of liver and intrahepatic bile duct: Secondary | ICD-10-CM | POA: Diagnosis not present

## 2020-08-09 DIAGNOSIS — F32A Depression, unspecified: Secondary | ICD-10-CM | POA: Diagnosis not present

## 2020-08-09 DIAGNOSIS — C7951 Secondary malignant neoplasm of bone: Secondary | ICD-10-CM | POA: Diagnosis not present

## 2020-08-09 DIAGNOSIS — Z171 Estrogen receptor negative status [ER-]: Secondary | ICD-10-CM | POA: Diagnosis not present

## 2020-08-13 ENCOUNTER — Other Ambulatory Visit (HOSPITAL_COMMUNITY): Payer: Self-pay

## 2020-08-13 MED FILL — Pantoprazole Sodium EC Tab 40 MG (Base Equiv): ORAL | 30 days supply | Qty: 30 | Fill #1 | Status: AC

## 2020-08-15 ENCOUNTER — Other Ambulatory Visit (HOSPITAL_COMMUNITY): Payer: Self-pay

## 2020-08-15 MED FILL — Ondansetron HCl Tab 8 MG: ORAL | 30 days supply | Qty: 60 | Fill #0 | Status: AC

## 2020-08-16 ENCOUNTER — Other Ambulatory Visit: Payer: Self-pay | Admitting: Radiology

## 2020-08-16 DIAGNOSIS — Z171 Estrogen receptor negative status [ER-]: Secondary | ICD-10-CM

## 2020-08-16 DIAGNOSIS — C50311 Malignant neoplasm of lower-inner quadrant of right female breast: Secondary | ICD-10-CM

## 2020-08-16 DIAGNOSIS — T148XXA Other injury of unspecified body region, initial encounter: Secondary | ICD-10-CM

## 2020-08-16 DIAGNOSIS — C50811 Malignant neoplasm of overlapping sites of right female breast: Secondary | ICD-10-CM | POA: Diagnosis not present

## 2020-08-16 NOTE — Progress Notes (Deleted)
Fer hom

## 2020-08-17 ENCOUNTER — Other Ambulatory Visit (HOSPITAL_COMMUNITY): Payer: Self-pay

## 2020-08-17 DIAGNOSIS — L719 Rosacea, unspecified: Secondary | ICD-10-CM | POA: Diagnosis not present

## 2020-08-17 MED ORDER — DOXYCYCLINE HYCLATE 20 MG PO TABS
40.0000 mg | ORAL_TABLET | Freq: Every day | ORAL | 2 refills | Status: DC
  Filled 2020-08-17: qty 68, 34d supply, fill #0

## 2020-08-22 DIAGNOSIS — Z5112 Encounter for antineoplastic immunotherapy: Secondary | ICD-10-CM | POA: Diagnosis not present

## 2020-08-22 DIAGNOSIS — C50811 Malignant neoplasm of overlapping sites of right female breast: Secondary | ICD-10-CM | POA: Diagnosis not present

## 2020-08-22 DIAGNOSIS — Z171 Estrogen receptor negative status [ER-]: Secondary | ICD-10-CM | POA: Diagnosis not present

## 2020-08-24 ENCOUNTER — Other Ambulatory Visit (HOSPITAL_COMMUNITY): Payer: Self-pay

## 2020-08-24 MED ORDER — CELECOXIB 200 MG PO CAPS
200.0000 mg | ORAL_CAPSULE | Freq: Every day | ORAL | 0 refills | Status: DC
  Filled 2020-08-24: qty 30, 30d supply, fill #0

## 2020-08-26 ENCOUNTER — Other Ambulatory Visit (HOSPITAL_COMMUNITY): Payer: Self-pay

## 2020-08-30 DIAGNOSIS — T2101XA Burn of unspecified degree of chest wall, initial encounter: Secondary | ICD-10-CM | POA: Diagnosis not present

## 2020-09-02 DIAGNOSIS — Z171 Estrogen receptor negative status [ER-]: Secondary | ICD-10-CM | POA: Diagnosis not present

## 2020-09-02 DIAGNOSIS — M899 Disorder of bone, unspecified: Secondary | ICD-10-CM | POA: Diagnosis not present

## 2020-09-02 DIAGNOSIS — C50811 Malignant neoplasm of overlapping sites of right female breast: Secondary | ICD-10-CM | POA: Diagnosis not present

## 2020-09-02 DIAGNOSIS — C7800 Secondary malignant neoplasm of unspecified lung: Secondary | ICD-10-CM | POA: Diagnosis not present

## 2020-09-02 DIAGNOSIS — K769 Liver disease, unspecified: Secondary | ICD-10-CM | POA: Diagnosis not present

## 2020-09-02 DIAGNOSIS — C7951 Secondary malignant neoplasm of bone: Secondary | ICD-10-CM | POA: Diagnosis not present

## 2020-09-02 DIAGNOSIS — M898X8 Other specified disorders of bone, other site: Secondary | ICD-10-CM | POA: Diagnosis not present

## 2020-09-02 DIAGNOSIS — R918 Other nonspecific abnormal finding of lung field: Secondary | ICD-10-CM | POA: Diagnosis not present

## 2020-09-02 DIAGNOSIS — N2889 Other specified disorders of kidney and ureter: Secondary | ICD-10-CM | POA: Diagnosis not present

## 2020-09-02 DIAGNOSIS — K8689 Other specified diseases of pancreas: Secondary | ICD-10-CM | POA: Diagnosis not present

## 2020-09-05 DIAGNOSIS — R21 Rash and other nonspecific skin eruption: Secondary | ICD-10-CM | POA: Diagnosis not present

## 2020-09-05 DIAGNOSIS — F418 Other specified anxiety disorders: Secondary | ICD-10-CM | POA: Diagnosis not present

## 2020-09-05 DIAGNOSIS — C50811 Malignant neoplasm of overlapping sites of right female breast: Secondary | ICD-10-CM | POA: Diagnosis not present

## 2020-09-05 DIAGNOSIS — Z171 Estrogen receptor negative status [ER-]: Secondary | ICD-10-CM | POA: Diagnosis not present

## 2020-09-05 DIAGNOSIS — Z79899 Other long term (current) drug therapy: Secondary | ICD-10-CM | POA: Diagnosis not present

## 2020-09-05 DIAGNOSIS — G893 Neoplasm related pain (acute) (chronic): Secondary | ICD-10-CM | POA: Diagnosis not present

## 2020-09-05 DIAGNOSIS — C7970 Secondary malignant neoplasm of unspecified adrenal gland: Secondary | ICD-10-CM | POA: Diagnosis not present

## 2020-09-08 ENCOUNTER — Other Ambulatory Visit (HOSPITAL_COMMUNITY): Payer: Self-pay

## 2020-09-08 MED FILL — Bupropion HCl Tab ER 24HR 150 MG: ORAL | 90 days supply | Qty: 90 | Fill #1 | Status: AC

## 2020-09-10 ENCOUNTER — Other Ambulatory Visit (HOSPITAL_COMMUNITY): Payer: Self-pay

## 2020-09-12 ENCOUNTER — Other Ambulatory Visit: Payer: Self-pay | Admitting: Radiation Oncology

## 2020-09-12 ENCOUNTER — Other Ambulatory Visit (HOSPITAL_COMMUNITY): Payer: Self-pay

## 2020-09-13 ENCOUNTER — Other Ambulatory Visit (HOSPITAL_COMMUNITY): Payer: Self-pay

## 2020-09-13 DIAGNOSIS — Z5111 Encounter for antineoplastic chemotherapy: Secondary | ICD-10-CM | POA: Diagnosis not present

## 2020-09-13 DIAGNOSIS — Z171 Estrogen receptor negative status [ER-]: Secondary | ICD-10-CM | POA: Diagnosis not present

## 2020-09-13 DIAGNOSIS — C50811 Malignant neoplasm of overlapping sites of right female breast: Secondary | ICD-10-CM | POA: Diagnosis not present

## 2020-09-13 MED ORDER — LORATADINE 10 MG PO TABS
10.0000 mg | ORAL_TABLET | Freq: Every day | ORAL | 1 refills | Status: DC
  Filled 2020-09-13: qty 30, 30d supply, fill #0

## 2020-09-13 MED ORDER — DEXAMETHASONE 4 MG PO TABS
ORAL_TABLET | ORAL | 0 refills | Status: DC
  Filled 2020-09-13: qty 30, 15d supply, fill #0

## 2020-09-13 MED ORDER — ONDANSETRON HCL 8 MG PO TABS
ORAL_TABLET | ORAL | 2 refills | Status: DC
  Filled 2020-09-13: qty 60, 30d supply, fill #0

## 2020-09-14 ENCOUNTER — Encounter: Payer: Self-pay | Admitting: Hematology and Oncology

## 2020-09-14 ENCOUNTER — Other Ambulatory Visit (HOSPITAL_COMMUNITY): Payer: Self-pay

## 2020-09-14 MED ORDER — BENZONATATE 100 MG PO CAPS
ORAL_CAPSULE | ORAL | 0 refills | Status: DC
  Filled 2020-09-14: qty 20, 7d supply, fill #0

## 2020-09-14 MED ORDER — HYDROCOD POLST-CPM POLST ER 10-8 MG/5ML PO SUER
ORAL | 0 refills | Status: DC
  Filled 2020-09-14: qty 115, 11d supply, fill #0

## 2020-09-16 ENCOUNTER — Other Ambulatory Visit: Payer: Self-pay

## 2020-09-16 ENCOUNTER — Encounter (HOSPITAL_BASED_OUTPATIENT_CLINIC_OR_DEPARTMENT_OTHER): Payer: Self-pay | Admitting: Emergency Medicine

## 2020-09-16 ENCOUNTER — Encounter: Payer: Self-pay | Admitting: Hematology and Oncology

## 2020-09-16 ENCOUNTER — Ambulatory Visit (INDEPENDENT_AMBULATORY_CARE_PROVIDER_SITE_OTHER): Payer: Medicaid Other

## 2020-09-16 ENCOUNTER — Emergency Department (HOSPITAL_BASED_OUTPATIENT_CLINIC_OR_DEPARTMENT_OTHER)
Admission: EM | Admit: 2020-09-16 | Discharge: 2020-09-16 | Disposition: A | Discharge status: Home / Self Care | Payer: Medicaid Other | Attending: Emergency Medicine | Admitting: Emergency Medicine

## 2020-09-16 ENCOUNTER — Other Ambulatory Visit (HOSPITAL_COMMUNITY): Payer: Self-pay

## 2020-09-16 ENCOUNTER — Telehealth: Payer: Medicaid Other | Admitting: Nurse Practitioner

## 2020-09-16 ENCOUNTER — Emergency Department (HOSPITAL_BASED_OUTPATIENT_CLINIC_OR_DEPARTMENT_OTHER): Payer: Medicaid Other

## 2020-09-16 ENCOUNTER — Telehealth: Payer: Medicaid Other | Admitting: Physician Assistant

## 2020-09-16 DIAGNOSIS — U071 COVID-19: Secondary | ICD-10-CM

## 2020-09-16 DIAGNOSIS — J45909 Unspecified asthma, uncomplicated: Secondary | ICD-10-CM | POA: Insufficient documentation

## 2020-09-16 DIAGNOSIS — Z79899 Other long term (current) drug therapy: Secondary | ICD-10-CM | POA: Insufficient documentation

## 2020-09-16 DIAGNOSIS — T50995A Adverse effect of other drugs, medicaments and biological substances, initial encounter: Secondary | ICD-10-CM

## 2020-09-16 DIAGNOSIS — C50919 Malignant neoplasm of unspecified site of unspecified female breast: Secondary | ICD-10-CM

## 2020-09-16 DIAGNOSIS — D849 Immunodeficiency, unspecified: Secondary | ICD-10-CM

## 2020-09-16 DIAGNOSIS — R059 Cough, unspecified: Secondary | ICD-10-CM | POA: Diagnosis not present

## 2020-09-16 DIAGNOSIS — E039 Hypothyroidism, unspecified: Secondary | ICD-10-CM | POA: Diagnosis not present

## 2020-09-16 DIAGNOSIS — Z853 Personal history of malignant neoplasm of breast: Secondary | ICD-10-CM | POA: Diagnosis not present

## 2020-09-16 DIAGNOSIS — R918 Other nonspecific abnormal finding of lung field: Secondary | ICD-10-CM | POA: Diagnosis not present

## 2020-09-16 IMAGING — DX DG CHEST 1V PORT
1 series · 1 of 1 positions shown · non-contrast
Comparison: [DATE]

CLINICAL DATA: Allergic reaction.  [VP] positive

EXAM:
PORTABLE CHEST 1 VIEW

[chest]
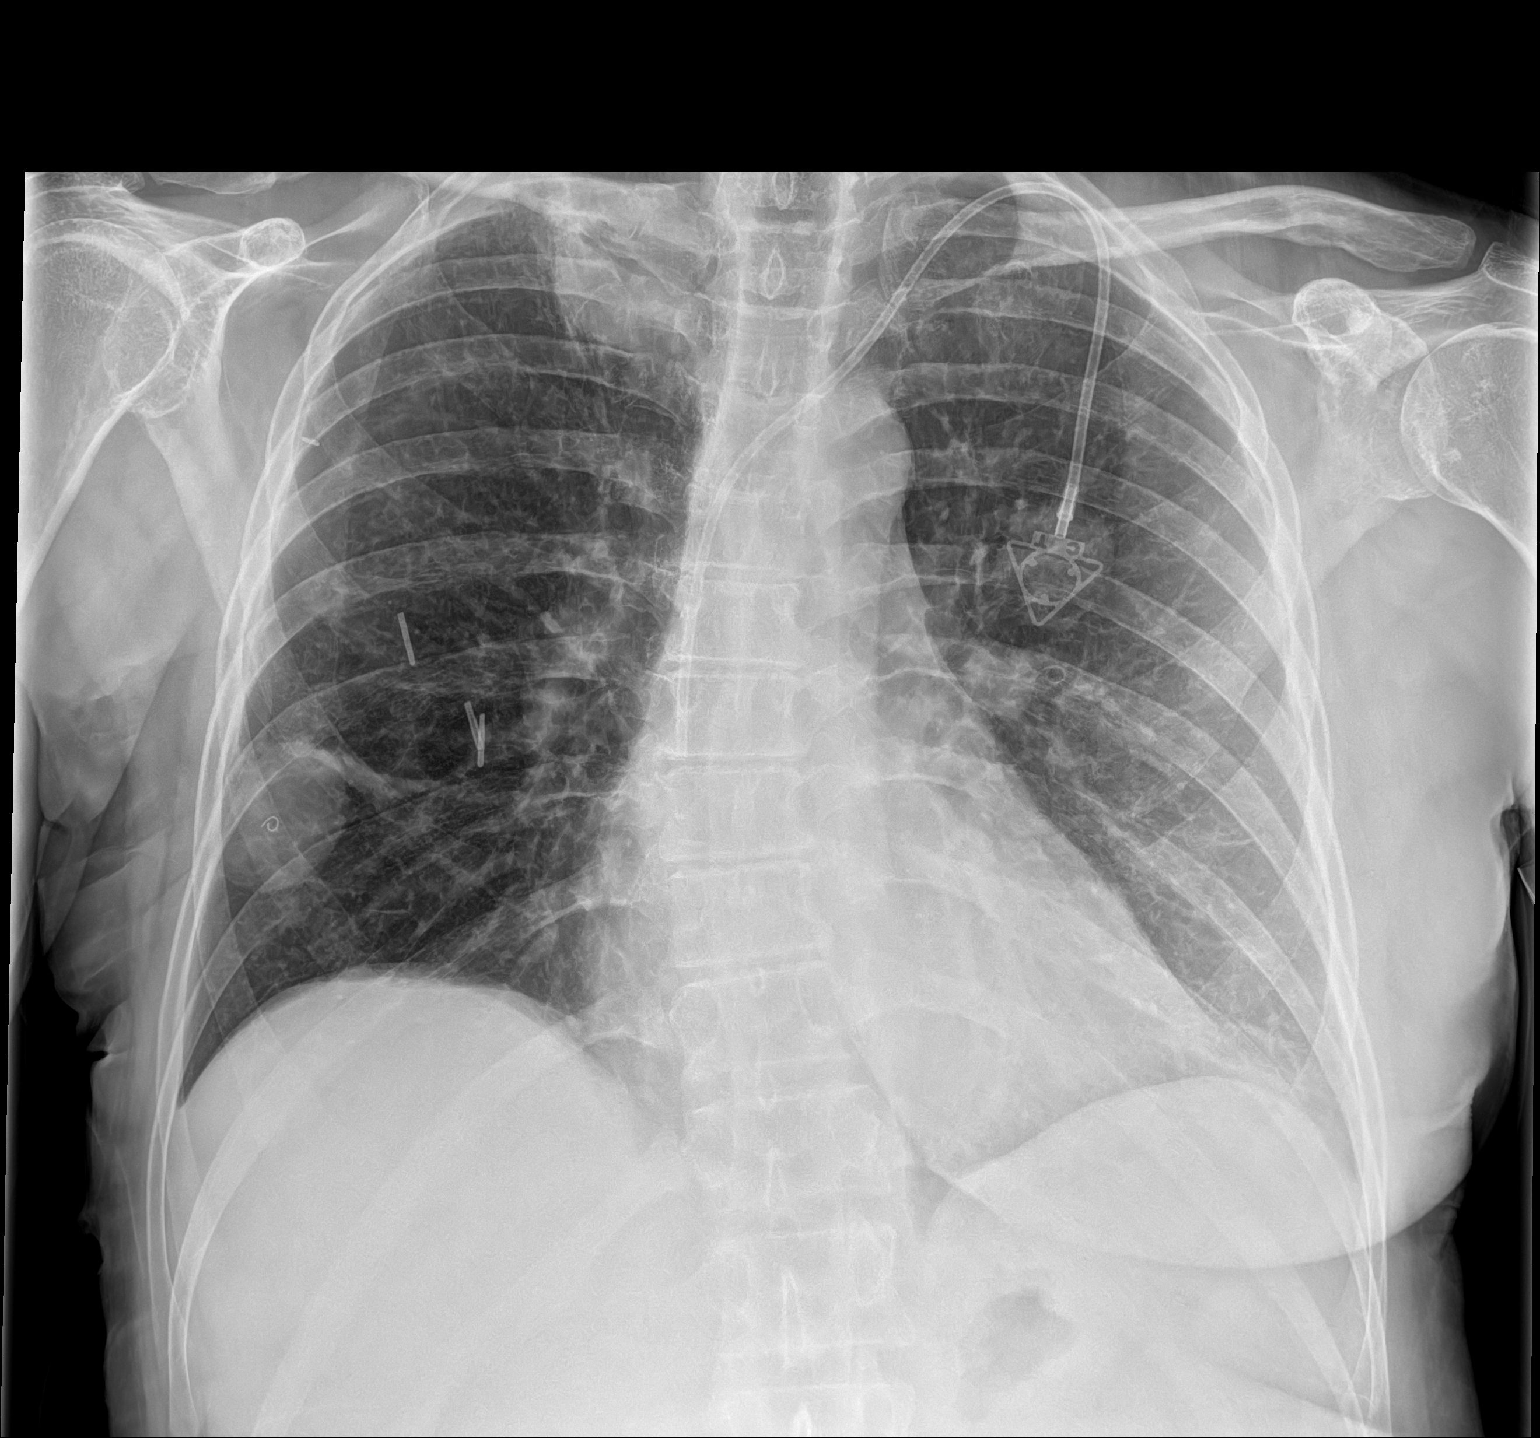

[1 of 1 positions shown; findings below may reference images not displayed]

FINDINGS: Left-sided chest port remains in place. Heart size within normal
limits. There is an approximately 4.0 cm rounded opacity at the
medial aspect of the right lung apex, new from prior. Rounded
density within the left mid lung partially obscured by port hub.
Nodular prominence of the left perihilar region. There is a rounded
radiodensity overlying the peripheral aspect of the right hemithorax
with small central radiodensity, which is favored to be external to
the patient. Diffusely coarsened interstitial markings bilaterally.
No pleural effusion or pneumothorax. No acute bony findings.
IMPRESSION: New 4.0 cm rounded opacity at the medial aspect of the right lung
apex. Additional nodular densities within the left mid lung.
Findings are suspicious for metastatic disease given history of
cancer. Further evaluation with contrast-enhanced CT of the chest is
recommended.

## 2020-09-16 MED ORDER — EPINEPHRINE 0.3 MG/0.3ML IJ SOAJ: 0.3000 mg | Freq: Once | INTRAMUSCULAR | Status: AC | PRN

## 2020-09-16 MED ORDER — BEBTELOVIMAB 175 MG/2 ML IV (EUA)
175.0000 mg | Freq: Once | INTRAMUSCULAR | Status: AC
  Administered 2020-09-16: 87.5 mg via INTRAVENOUS

## 2020-09-16 MED ORDER — SODIUM CHLORIDE 0.9 % IV SOLN: 20.0000 mg | Freq: Once | INTRAVENOUS | Status: DC | PRN

## 2020-09-16 MED ORDER — SODIUM CHLORIDE 0.9 % IV SOLN: INTRAVENOUS | Status: DC | PRN

## 2020-09-16 MED ORDER — SODIUM CHLORIDE 0.9 % IV SOLN: 125.0000 mg | Freq: Once | INTRAVENOUS | Status: DC | PRN

## 2020-09-16 MED ORDER — ONDANSETRON HCL 4 MG/2ML IJ SOLN
4.0000 mg | Freq: Once | INTRAMUSCULAR | Status: AC
  Administered 2020-09-16: 4 mg via INTRAVENOUS

## 2020-09-16 MED ORDER — HEPARIN SOD (PORK) LOCK FLUSH 100 UNIT/ML IV SOLN
500.0000 [IU] | Freq: Once | INTRAVENOUS | Status: AC
  Administered 2020-09-16: 500 [IU] via INTRAVENOUS

## 2020-09-16 MED ORDER — DIPHENHYDRAMINE HCL 50 MG/ML IJ SOLN: 50.0000 mg | Freq: Once | INTRAMUSCULAR | Status: AC | PRN

## 2020-09-16 MED ORDER — METHYLPREDNISOLONE SODIUM SUCC 125 MG IJ SOLR
125.0000 mg | Freq: Once | INTRAMUSCULAR | Status: AC | PRN
  Administered 2020-09-16: 125 mg via INTRAVENOUS
  Filled 2020-09-16: qty 2

## 2020-09-16 MED ORDER — ALBUTEROL SULFATE HFA 108 (90 BASE) MCG/ACT IN AERS: 2.0000 | INHALATION_SPRAY | Freq: Once | RESPIRATORY_TRACT | Status: AC | PRN

## 2020-09-16 MED ORDER — EPINEPHRINE 0.3 MG/0.3ML IJ SOAJ: 0.3000 mg | Freq: Once | INTRAMUSCULAR | Status: DC | PRN

## 2020-09-16 MED ORDER — FAMOTIDINE IN NACL 20-0.9 MG/50ML-% IV SOLN
20.0000 mg | Freq: Once | INTRAVENOUS | Status: AC | PRN
  Administered 2020-09-16: 20 mg via INTRAVENOUS
  Filled 2020-09-16: qty 50

## 2020-09-16 MED ORDER — SODIUM CHLORIDE 0.9 % IV BOLUS
500.0000 mL | Freq: Once | INTRAVENOUS | Status: AC
  Administered 2020-09-16: 500 mL via INTRAVENOUS
  Filled 2020-09-16: qty 500

## 2020-09-16 MED ORDER — ALBUTEROL SULFATE HFA 108 (90 BASE) MCG/ACT IN AERS: 2.0000 | INHALATION_SPRAY | Freq: Once | RESPIRATORY_TRACT | Status: DC | PRN

## 2020-09-16 MED ORDER — BEBTELOVIMAB 175 MG/2 ML IV (EUA): 175.0000 mg | Freq: Once | INTRAMUSCULAR | Status: DC

## 2020-09-16 MED ORDER — DIPHENHYDRAMINE HCL 50 MG/ML IJ SOLN
50.0000 mg | Freq: Once | INTRAMUSCULAR | Status: AC | PRN
  Administered 2020-09-16: 50 mg via INTRAVENOUS
  Filled 2020-09-16: qty 1

## 2020-09-16 MED ORDER — MOLNUPIRAVIR EUA 200MG CAPSULE: 4.0000 | ORAL_CAPSULE | Freq: Two times a day (BID) | ORAL | 0 refills | Status: AC

## 2020-09-16 NOTE — Progress Notes (Signed)
Virtual Visit Consent   Kara Pittman, you are scheduled for a virtual visit with a [facility] provider today.     Just as with appointments in the office, your consent must be obtained to participate.  Your consent will be active for this visit and any virtual visit you may have with one of our providers in the next 365 days.     If you have a MyChart account, a copy of this consent can be sent to you electronically.  All virtual visits are billed to your insurance company just like a traditional visit in the office.    As this is a virtual visit, video technology does not allow for your provider to perform a traditional examination.  This may limit your provider's ability to fully assess your condition.  If your provider identifies any concerns that need to be evaluated in person or the need to arrange testing (such as labs, EKG, etc.), we will make arrangements to do so.     Although advances in technology are sophisticated, we cannot ensure that it will always work on either your end or our end.  If the connection with a video visit is poor, the visit may have to be switched to a telephone visit.  With either a video or telephone visit, we are not always able to ensure that we have a secure connection.     I need to obtain your verbal consent now.   Are you willing to proceed with your visit today?    Kara Pittman has provided verbal consent on 09/16/2020 for a virtual visit (video or telephone).   Leeanne Rio, [place]   Date: 09/16/2020 2:08 PM   Virtual Visit via Video Note   I, Leeanne Rio, connected with  Kara Pittman  ([id number], 18-Jan-1966) on 09/16/20 at  2:00 PM EDT by a video-enabled telemedicine application and verified that I am speaking with the correct person using two identifiers.  Location: Patient: Virtual Visit Location Patient: Home Provider: Virtual Visit Location Provider: Home Office   I discussed the limitations of evaluation and management by  telemedicine and the availability of in person appointments. The patient expressed understanding and agreed to proceed.    History of Present Illness: Kara Pittman is a 55 y.o. who identifies as a female who was assigned female at birth, and is being seen today for possible MAb infusion for COVID-19. Patient endorses symptom onset Tuesday morning with URI symptoms, aches, fatigue, anorexia. No chest pain or significant SOB. Intermittent fevers. She initially thought aches and symptoms were related to new chemotherapy agent she was started on last week for her Stage IV metastatic breast cancer. She took a COVID test yesterday after her BF tested positive for COVID and she was found to be + as well. She reached out to her Oncologist who was trying to set her up for infusion at [place] [facility] but they were unable to get her in. As such she was instructed to reach out to [facility] to get in for infusion. She has already been scheduled but needs orders from a [facility] provider. She is scared of severe illness due to her significant health issues.   HPI: HPI  Problems:  Patient Active Problem List   Diagnosis Date Noted   Stage IV breast cancer in female [facility])    Open wound of right chest wall    Dehydration    Cancer related pain    Leukocytosis  Anemia of chronic disease    Debility    Sepsis due to gram-negative UTI ([place]) 09/27/2019   Cancer of overlapping sites of right female breast ([place]) 04/21/2019   Port-A-Cath in place 03/26/2019   Genetic testing 12/23/2018   Family history of leukemia    Family history of throat cancer    Family history of skin cancer    Family history of lung cancer    Malignant neoplasm of lower-inner quadrant of right breast of female, estrogen receptor negative ([place]) 12/12/2018   Screening breast examination 12/04/2018   Breast mass, right 12/01/2018   Grief 05/06/2018   Menopausal symptom 07/04/2017   Colon cancer screening 07/04/2017   Encounter for preventative  adult health care examination 07/04/2017   Family history of coronary arteriosclerosis 07/04/2017   Vitamin D deficiency 05/17/2016   Screening cholesterol level 05/17/2016   Personal history of malignant melanoma of skin 05/17/2016   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/17/2016   Hypothyroidism 01/21/2015   Melanoma of scalp ([place]) 01/17/2015   RHINITIS, ALLERGIC 04/04/2006   CERVICAL SPINE DISORDER, NOS 04/04/2006    Allergies:  Allergies  Allergen Reactions   Hydrocodone-Acetaminophen Nausea Only   Morphine And Related Hives   Vancomycin Other (See Comments)    Redman's Syndrome   Medications:  Current Outpatient Medications:    doxycycline (PERIOSTAT) 20 MG tablet, Take 2 tablets by mouth daily., Disp: , Rfl:    ondansetron (ZOFRAN) 8 MG tablet, Take by mouth., Disp: , Rfl:    acetaminophen (TYLENOL) 500 MG tablet, Take 500-1,000 mg by mouth every 6 (six) hours as needed for mild pain or headache., Disp: , Rfl:    adapalene (DIFFERIN) 0.1 % cream, Apply topically nightly, Disp: 45 g, Rfl: 1   buPROPion (WELLBUTRIN XL) 150 MG 24 hr tablet, TAKE 1 TABLET BY MOUTH ONCE DAILY, Disp: 90 tablet, Rfl: 3   celecoxib (CELEBREX) 200 MG capsule, TAKE 1 CAPSULE BY MOUTH ONCE DAILY, Disp: 30 capsule, Rfl: 0   hydrocortisone cream 1 %, Apply to affected area twice daily, Disp: 30 g, Rfl: 0   lidocaine-prilocaine (EMLA) cream, Apply to affected area once, Disp: 30 g, Rfl: 3   lidocaine-prilocaine (EMLA) cream, APPLY A SMALL AMOUNT TO SKIN 1 HOUR PRIOR TO PORT ACCESS AS DIRECTED, Disp: 30 g, Rfl: 2   loratadine (CLARITIN) 10 MG tablet, Take 1 tablet (10 mg total) by mouth once daily, Disp: 30 tablet, Rfl: 1   ondansetron (ZOFRAN) 8 MG tablet, Take 1 tablet (8 mg total) by mouth every 12 (twelve) hours as needed for nausea or vomiting, Disp: 60 tablet, Rfl: 2   pantoprazole (PROTONIX) 40 MG tablet, TAKE 1 TABLET BY MOUTH ONCE A DAY, Disp: 30 tablet, Rfl: 11   SYNTHROID 75 MCG  tablet, TAKE 1 TABLET (75 MCG TOTAL) BY MOUTH DAILY BEFORE BREAKFAST., Disp: 90 tablet, Rfl: 3   UDENYCA 6 MG/0.6ML injection, Inject into the skin., Disp: , Rfl:   Current Facility-Administered Medications:    0.9 %  sodium chloride infusion, , Intravenous, PRN, Brunetta Jeans, PA-C   albuterol (VENTOLIN HFA) 108 (90 Base) MCG/ACT inhaler 2 puff, 2 puff, Inhalation, Once PRN, Brunetta Jeans, PA-C   bebtelovimab EUA injection SOLN 175 mg, 175 mg, Intravenous, Once, Brunetta Jeans, PA-C   diphenhydrAMINE (BENADRYL) injection 50 mg, 50 mg, Intravenous, Once PRN, Brunetta Jeans, PA-C   EPINEPHrine (EPI-PEN) injection 0.3 mg, 0.3 mg, Intramuscular, Once PRN, Brunetta Jeans, PA-C  famotidine (PEPCID) 20 mg in sodium chloride 0.9 % 50 mL IVPB, 20 mg, Intravenous, Once PRN, Brunetta Jeans, PA-C   methylPREDNISolone sodium succinate (SOLU-MEDROL) 130 mg in sodium chloride 0.9 % 50 mL IVPB, 130 mg, Intravenous, Once PRN, Brunetta Jeans, PA-C  Observations/Objective: Patient is well-developed, well-nourished in no acute distress.  Resting comfortably at home.  Head is normocephalic, atraumatic.  No labored breathing. Speech is clear and coherent with logical content.  Patient is alert and oriented at baseline.   Assessment and Plan: 1. COVID-19 - bebtelovimab EUA injection SOLN 175 mg - 0.9 %  sodium chloride infusion - Hypersensitivity GRADE 1: Transient flushing or rash, or drug fever < 100.4 F - Hypersensitivity GRADE 2: Rash, flushing, urticaria, dyspnea, or drug fever = or > 100.4 F - Hypersensitivity GRADE 3: symptomatic bronchospasm, with or without urticaria, parenteral medication management indicated, allergy-related edema/angioedema, or hypotension - Hypersensitivity GRADE 4: Anaphylaxis - diphenhydrAMINE (BENADRYL) injection 50 mg - famotidine (PEPCID) 20 mg in sodium chloride 0.9 % 50 mL IVPB - methylPREDNISolone sodium succinate (SOLU-MEDROL) 130 mg in sodium  chloride 0.9 % 50 mL IVPB - albuterol (VENTOLIN HFA) 108 (90 Base) MCG/ACT inhaler 2 puff - EPINEPHrine (EPI-PEN) injection 0.3 mg  2. Immunocompromised ([place]) - bebtelovimab EUA injection SOLN 175 mg - 0.9 %  sodium chloride infusion - Hypersensitivity GRADE 1: Transient flushing or rash, or drug fever < 100.4 F - Hypersensitivity GRADE 2: Rash, flushing, urticaria, dyspnea, or drug fever = or > 100.4 F - Hypersensitivity GRADE 3: symptomatic bronchospasm, with or without urticaria, parenteral medication management indicated, allergy-related edema/angioedema, or hypotension - Hypersensitivity GRADE 4: Anaphylaxis - diphenhydrAMINE (BENADRYL) injection 50 mg - famotidine (PEPCID) 20 mg in sodium chloride 0.9 % 50 mL IVPB - methylPREDNISolone sodium succinate (SOLU-MEDROL) 130 mg in sodium chloride 0.9 % 50 mL IVPB - albuterol (VENTOLIN HFA) 108 (90 Base) MCG/ACT inhaler 2 puff - EPINEPHrine (EPI-PEN) injection 0.3 mg  3. Stage IV breast cancer in female ([place]) - bebtelovimab EUA injection SOLN 175 mg - 0.9 %  sodium chloride infusion - Hypersensitivity GRADE 1: Transient flushing or rash, or drug fever < 100.4 F - Hypersensitivity GRADE 2: Rash, flushing, urticaria, dyspnea, or drug fever = or > 100.4 F - Hypersensitivity GRADE 3: symptomatic bronchospasm, with or without urticaria, parenteral medication management indicated, allergy-related edema/angioedema, or hypotension - Hypersensitivity GRADE 4: Anaphylaxis - diphenhydrAMINE (BENADRYL) injection 50 mg - famotidine (PEPCID) 20 mg in sodium chloride 0.9 % 50 mL IVPB - methylPREDNISolone sodium succinate (SOLU-MEDROL) 130 mg in sodium chloride 0.9 % 50 mL IVPB - albuterol (VENTOLIN HFA) 108 (90 Base) MCG/ACT inhaler 2 puff - EPINEPHrine (EPI-PEN) injection 0.3 mg  Patient with multiple risk factors for complicated course of illness. Severe risk of severe illness and hospitalization without intervention. They are a candidate for  infusion based on CDC criteria. Would not be a good candidate for PAxlovid due to medications. Is not severe enough at present to warrant hospitalization for IV antivirals. Order set placed for infusion. . Supportive measures, OTC medications and vitamin regimen reviewed. Patient has been enrolled in a MyChart COVID symptom monitoring program. Samule Dry reviewed in detail. Strict ER precautions discussed with patient.    Follow Up Instructions: I discussed the assessment and treatment plan with the patient. The patient was provided an opportunity to ask questions and all were answered. The patient agreed with the plan and demonstrated an understanding of the instructions.  A copy of instructions were sent to  the patient via [place].  The patient was advised to call back or seek an in-person evaluation if the symptoms worsen or if the condition fails to improve as anticipated.  Time:  I spent 20 minutes with the patient via telehealth technology discussing the above problems/concerns.    Leeanne Rio, PA-C

## 2020-09-16 NOTE — Progress Notes (Signed)
Diagnosis: COVID  Provider:  Marshell Garfinkel, MD  Procedure: Infusion  IV Type: Port a Cath, IV Location: L Chest  Bebtelovimab, Dose: 175 mg  Infusion Start Time: 1537pm  Infusion Stop Time: 1538pm  Patient experienced itching after 1/2 of IV Push Bebtelovimab completed.  Medication stopped, patient became nauseated and diaphoretic and hypotensive.  Treated with IV Benadryl '50mg'$ , Solumedrol '125mg'$ , Pepcid '20mg'$ , and Zofran '4mg'$ .  Itching relieved soon after IV Benadryl.  BP back to baseline 100s/70s.  Diaphoresis returned approximately 30 minutes after initial episode with BP 94 systolic. Spoke with Dr. Vaughan Browner and orders received for 519m IV Normal Saline Bolus.  Upon completion of bolus BP back to baseline, observed for additional 30 minutes, patient stated she felt much better, only tired likely form the IV Benadryl.  Port was flushed with NS and Heparin and deaccessed.  Patient taken to car via wheelchair.  Emphasized need to call 911 should itching return and or shortness of breath or other signs of serious reaction.  Verbalized understanding.  Notified Niece of reaction as well.  Patient to follow up with [facility] MD for further guidance regarding need for oral antivirals.  Post Infusion IV Care: Observation period completed and Port a Cath Deaccessed/Flushed  Discharge: Condition: Good, Destination: Home . AVS provided to patient.   Performed by:  SJena Gauss DNP, RN

## 2020-09-16 NOTE — Patient Instructions (Signed)
10 Things You Can Do to Manage Your COVID-19 Symptoms at Home If you have possible or confirmed COVID-19 Stay home except to get medical care. Monitor your symptoms carefully. If your symptoms get worse, call your healthcare provider immediately. Get rest and stay hydrated. If you have a medical appointment, call the healthcare provider ahead of time and tell them that you have or may have COVID-19. For medical emergencies, call 911 and notify the dispatch personnel that you have or may have COVID-19. Cover your cough and sneezes with a tissue or use the inside of your elbow. Wash your hands often with soap and water for at least 20 seconds or clean your hands with an alcohol-based hand sanitizer that contains at least 60% alcohol. As much as possible, stay in a specific room and away from other people in your home. Also, you should use a separate bathroom, if available. If you need to be around other people in or outside of the home, wear a mask. Avoid sharing personal items with other people in your household, like dishes, towels, and bedding. Clean all surfaces that are touched often, like counters, tabletops, and doorknobs. Use household cleaning sprays or wipes according to the label instructions. cdc.gov/coronavirus What types of side effects do monoclonal antibody drugs cause?  Common side effects  In general, the more common side effects caused by monoclonal antibody drugs include: Allergic reactions, such as hives or itching Flu-like signs and symptoms, including chills, fatigue, fever, and muscle aches and pains Nausea, vomiting Diarrhea Skin rashes Low blood pressure   The CDC is recommending patients who receive monoclonal antibody treatments wait at least 90 days before being vaccinated.  Currently, there are no data on the safety and efficacy of mRNA COVID-19 vaccines in persons who received monoclonal antibodies or convalescent plasma as part of COVID-19 treatment. Based on  the estimated half-life of such therapies as well as evidence suggesting that reinfection is uncommon in the 90 days after initial infection, vaccination should be deferred for at least 90 days, as a precautionary measure until additional information becomes available, to avoid interference of the antibody treatment with vaccine-induced immune responses.  08/21/2019 This information is not intended to replace advice given to you by your health care provider. Make sure you discuss any questions you have with your healthcare provider. Document Revised: 03/11/2020 Document Reviewed: 03/11/2020 Elsevier Patient Education  2022 [employer]  

## 2020-09-16 NOTE — ED Triage Notes (Signed)
Pt arrives to ED with c/o of allergic reaction and COVID. Pt reports the allergic reaction was caused by her new cancer treatment where pt had low BP, itching, and hives. Pt was tested positive for COVID yesterday and is here for a chest xray to rule out pneumonia.
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
## 2020-09-16 NOTE — Patient Instructions (Signed)
Wynonia Sours, thank you for joining Leeanne Rio, PA-C for today's virtual visit.  While this provider is not your primary care provider (PCP), if your PCP is located in our provider database this encounter information will be shared with them immediately following your visit.  Consent: (Patient) Kara Pittman provided verbal consent for this virtual visit at the beginning of the encounter.  Current Medications:  Current Outpatient Medications:    doxycycline (PERIOSTAT) 20 MG tablet, Take 2 tablets by mouth daily., Disp: , Rfl:    ondansetron (ZOFRAN) 8 MG tablet, Take by mouth., Disp: , Rfl:    acetaminophen (TYLENOL) 500 MG tablet, Take 500-1,000 mg by mouth every 6 (six) hours as needed for mild pain or headache., Disp: , Rfl:    adapalene (DIFFERIN) 0.1 % cream, Apply topically nightly, Disp: 45 g, Rfl: 1   buPROPion (WELLBUTRIN XL) 150 MG 24 hr tablet, TAKE 1 TABLET BY MOUTH ONCE DAILY, Disp: 90 tablet, Rfl: 3   celecoxib (CELEBREX) 200 MG capsule, TAKE 1 CAPSULE BY MOUTH ONCE DAILY, Disp: 30 capsule, Rfl: 0   hydrocortisone cream 1 %, Apply to affected area twice daily, Disp: 30 g, Rfl: 0   lidocaine-prilocaine (EMLA) cream, Apply to affected area once, Disp: 30 g, Rfl: 3   lidocaine-prilocaine (EMLA) cream, APPLY A SMALL AMOUNT TO SKIN 1 HOUR PRIOR TO PORT ACCESS AS DIRECTED, Disp: 30 g, Rfl: 2   loratadine (CLARITIN) 10 MG tablet, Take 1 tablet (10 mg total) by mouth once daily, Disp: 30 tablet, Rfl: 1   ondansetron (ZOFRAN) 8 MG tablet, Take 1 tablet (8 mg total) by mouth every 12 (twelve) hours as needed for nausea or vomiting, Disp: 60 tablet, Rfl: 2   pantoprazole (PROTONIX) 40 MG tablet, TAKE 1 TABLET BY MOUTH ONCE A DAY, Disp: 30 tablet, Rfl: 11   SYNTHROID 75 MCG tablet, TAKE 1 TABLET (75 MCG TOTAL) BY MOUTH DAILY BEFORE BREAKFAST., Disp: 90 tablet, Rfl: 3   UDENYCA 6 MG/0.6ML injection, Inject into the skin., Disp: , Rfl:   Current Facility-Administered Medications:     0.9 %  sodium chloride infusion, , Intravenous, PRN, Brunetta Jeans, PA-C   albuterol (VENTOLIN HFA) 108 (90 Base) MCG/ACT inhaler 2 puff, 2 puff, Inhalation, Once PRN, Brunetta Jeans, PA-C   bebtelovimab EUA injection SOLN 175 mg, 175 mg, Intravenous, Once, Brunetta Jeans, PA-C   diphenhydrAMINE (BENADRYL) injection 50 mg, 50 mg, Intravenous, Once PRN, Brunetta Jeans, PA-C   EPINEPHrine (EPI-PEN) injection 0.3 mg, 0.3 mg, Intramuscular, Once PRN, Brunetta Jeans, PA-C   famotidine (PEPCID) 20 mg in sodium chloride 0.9 % 50 mL IVPB, 20 mg, Intravenous, Once PRN, Brunetta Jeans, PA-C   methylPREDNISolone sodium succinate (SOLU-MEDROL) 130 mg in sodium chloride 0.9 % 50 mL IVPB, 130 mg, Intravenous, Once PRN, Brunetta Jeans, PA-C   Medications ordered in this encounter:  Meds ordered this encounter  Medications   bebtelovimab EUA injection SOLN 175 mg    Order Specific Question:   I attest that this patient meets the FDA Emergency Use Authorization criteria and has risk factor(s) for progression to severe COVID-19 and/or hospitalization:    Answer:   Yes    Order Specific Question:   High Risk Factor(s) for COVID-19 Progression:    Answer:   Immunosuppressive disease or treatment    Order Specific Question:   High Risk Factor(s) for COVID-19 Progression:    Answer:   Chronic lung disease  Order Specific Question:   High Risk Factor(s) for COVID-19 Progression:    Answer:   Other CDC high risk condition    Order Specific Question:   CDC high risk medical condition:    Answer:   Metastatic Breast Cancer under chemotherapy.   0.9 %  sodium chloride infusion   diphenhydrAMINE (BENADRYL) injection 50 mg   famotidine (PEPCID) 20 mg in sodium chloride 0.9 % 50 mL IVPB   methylPREDNISolone sodium succinate (SOLU-MEDROL) 130 mg in sodium chloride 0.9 % 50 mL IVPB   albuterol (VENTOLIN HFA) 108 (90 Base) MCG/ACT inhaler 2 puff   EPINEPHrine (EPI-PEN) injection 0.3 mg      *If you need refills on other medications prior to your next appointment, please contact your pharmacy*  Follow-Up: Call back or seek an in-person evaluation if the symptoms worsen or if the condition fails to improve as anticipated.  Other Instructions Please review the instructions sent by the [facility] for your appointment today.  I want you to schedule a follow-up visit with your PCP (video) for COVID after infusion so symptoms can be reassessed.   Please keep well-hydrated and get plenty of rest. Start a saline nasal rinse to flush out your nasal passages. You can use plain Mucinex to help thin congestion. If you have a humidifier, running in the bedroom at night. I want you to start OTC vitamin D3 1000 units daily, vitamin C 1000 mg daily, and a zinc supplement. Please take prescribed medications as directed.  You have been enrolled in a MyChart symptom monitoring program. Please answer these questions daily so we can keep track of how you are doing.  You were to quarantine for 5 days from onset of your symptoms.  After day 5, if you have had no fever and you are feeling better, you can end quarantine but need to mask for an additional 5 days. After day 5 if you have a fever or are having significant symptoms, please quarantine for full 10 days.  If you note any worsening of symptoms, any significant shortness of breath or any chest pain, please seek ER evaluation ASAP.  Please do not delay care!    If you have been instructed to have an in-person evaluation today at a local Urgent Care facility, please use the link below. It will take you to a list of all of our available [facility], including address, phone number and hours of operation. Please do not delay care.  [facility]  If you or a family member do not have a primary care provider, use the link below to schedule a visit and establish care. When you choose a [facility] primary care  physician or advanced practice provider, you gain a long-term partner in health. Find a Primary Care Provider  Learn more about [facility]'s in-office and virtual care options: [facility]

## 2020-09-19 ENCOUNTER — Telehealth: Payer: Self-pay

## 2020-09-19 NOTE — Telephone Encounter (Signed)
Transition Care Management Unsuccessful Follow-up Telephone Call  Date of discharge and from where:  09/16/2020-[facility]  Attempts:  1st Attempt  Reason for unsuccessful TCM follow-up call:  Left voice message

## 2020-09-20 NOTE — Telephone Encounter (Signed)
Transition Care Management Unsuccessful Follow-up Telephone Call  Date of discharge and from where:  09/16/2020-[facility]  Attempts:  2nd Attempt  Reason for unsuccessful TCM follow-up call:  Left voice message

## 2020-09-21 NOTE — Telephone Encounter (Signed)
Transition Care Management Follow-up Telephone Call Date of discharge and from where: 09/16/2020-[facility] How have you been since you were released from the hospital? Patient stated she is doing good.  Any questions or concerns? No  Items Reviewed: Did the pt receive and understand the discharge instructions provided? Yes  Medications obtained and verified? Yes  Other? No  Any new allergies since your discharge? No  Dietary orders reviewed? N/A Do you have support at home? Yes   Home Care and Equipment/Supplies: Were home health services ordered? not applicable If so, what is the name of the agency? N/A  Has the agency set up a time to come to the patient's home? not applicable Were any new equipment or medical supplies ordered?  No What is the name of the medical supply agency? N/A Were you able to get the supplies/equipment? not applicable Do you have any questions related to the use of the equipment or supplies? No  Functional Questionnaire: (I = Independent and D = Dependent) ADLs: I  Bathing/Dressing- I  Meal Prep- I  Eating- I  Maintaining continence- I  Transferring/Ambulation- I  Managing Meds- I  Follow up appointments reviewed:  PCP Hospital f/u appt confirmed? No   Specialist Hospital f/u appt confirmed? No   Are transportation arrangements needed? No  If their condition worsens, is the pt aware to call PCP or go to the Emergency Dept.? Yes Was the patient provided with contact information for the PCP's office or ED? Yes Was to pt encouraged to call back with questions or concerns? Yes

## 2020-09-28 ENCOUNTER — Other Ambulatory Visit: Payer: Self-pay

## 2020-09-28 ENCOUNTER — Other Ambulatory Visit (HOSPITAL_COMMUNITY): Payer: Self-pay

## 2020-09-28 MED ORDER — CELECOXIB 200 MG PO CAPS
200.0000 mg | ORAL_CAPSULE | Freq: Every day | ORAL | 0 refills | Status: DC
  Filled 2020-09-28: qty 30, 30d supply, fill #0

## 2020-09-29 ENCOUNTER — Other Ambulatory Visit (HOSPITAL_COMMUNITY): Payer: Self-pay

## 2020-09-29 ENCOUNTER — Encounter: Payer: Self-pay | Admitting: Hematology and Oncology

## 2020-09-29 DIAGNOSIS — C50311 Malignant neoplasm of lower-inner quadrant of right female breast: Secondary | ICD-10-CM | POA: Diagnosis not present

## 2020-09-29 DIAGNOSIS — T2101XA Burn of unspecified degree of chest wall, initial encounter: Secondary | ICD-10-CM | POA: Diagnosis not present

## 2020-09-29 MED ORDER — BENZONATATE 100 MG PO CAPS
ORAL_CAPSULE | ORAL | 0 refills | Status: DC
  Filled 2020-09-29: qty 20, 6d supply, fill #0

## 2020-09-30 ENCOUNTER — Other Ambulatory Visit: Payer: Self-pay

## 2020-09-30 ENCOUNTER — Encounter (HOSPITAL_COMMUNITY): Payer: Self-pay

## 2020-09-30 ENCOUNTER — Emergency Department (HOSPITAL_COMMUNITY): Payer: Medicaid Other

## 2020-09-30 ENCOUNTER — Other Ambulatory Visit (HOSPITAL_COMMUNITY): Payer: Self-pay

## 2020-09-30 ENCOUNTER — Emergency Department (HOSPITAL_COMMUNITY)
Admission: EM | Admit: 2020-09-30 | Discharge: 2020-10-01 | Disposition: A | Discharge status: Home / Self Care | Payer: Medicaid Other | Source: Home / Self Care | Attending: Emergency Medicine | Admitting: Emergency Medicine

## 2020-09-30 DIAGNOSIS — J45909 Unspecified asthma, uncomplicated: Secondary | ICD-10-CM | POA: Insufficient documentation

## 2020-09-30 DIAGNOSIS — Z8616 Personal history of COVID-19: Secondary | ICD-10-CM

## 2020-09-30 DIAGNOSIS — E039 Hypothyroidism, unspecified: Secondary | ICD-10-CM | POA: Insufficient documentation

## 2020-09-30 DIAGNOSIS — C50912 Malignant neoplasm of unspecified site of left female breast: Secondary | ICD-10-CM | POA: Insufficient documentation

## 2020-09-30 DIAGNOSIS — R059 Cough, unspecified: Secondary | ICD-10-CM | POA: Insufficient documentation

## 2020-09-30 DIAGNOSIS — Z79899 Other long term (current) drug therapy: Secondary | ICD-10-CM | POA: Insufficient documentation

## 2020-09-30 DIAGNOSIS — R079 Chest pain, unspecified: Secondary | ICD-10-CM | POA: Insufficient documentation

## 2020-09-30 DIAGNOSIS — C50919 Malignant neoplasm of unspecified site of unspecified female breast: Secondary | ICD-10-CM | POA: Diagnosis not present

## 2020-09-30 DIAGNOSIS — R000 Tachycardia, unspecified: Secondary | ICD-10-CM | POA: Insufficient documentation

## 2020-09-30 LAB — CBC WITH DIFFERENTIAL/PLATELET
Abs Immature Granulocytes: 0.04 10*3/uL (ref 0.00–0.07)
Basophils Absolute: 0 10*3/uL (ref 0.0–0.1)
Basophils Relative: 0 %
Eosinophils Absolute: 0.1 10*3/uL (ref 0.0–0.5)
Eosinophils Relative: 1 %
HCT: 28.9 % — ABNORMAL LOW (ref 36.0–46.0)
Hemoglobin: 9.2 g/dL — ABNORMAL LOW (ref 12.0–15.0)
Immature Granulocytes: 1 %
Lymphocytes Relative: 10 %
Lymphs Abs: 0.9 10*3/uL (ref 0.7–4.0)
MCH: 31.9 pg (ref 26.0–34.0)
MCHC: 31.8 g/dL (ref 30.0–36.0)
MCV: 100.3 fL — ABNORMAL HIGH (ref 80.0–100.0)
Monocytes Absolute: 0.7 10*3/uL (ref 0.1–1.0)
Monocytes Relative: 8 %
Neutro Abs: 7 10*3/uL (ref 1.7–7.7)
Neutrophils Relative %: 80 %
Platelets: 147 10*3/uL — ABNORMAL LOW (ref 150–400)
RBC: 2.88 MIL/uL — ABNORMAL LOW (ref 3.87–5.11)
RDW: 13.4 % (ref 11.5–15.5)
WBC: 8.7 10*3/uL (ref 4.0–10.5)
nRBC: 0 % (ref 0.0–0.2)

## 2020-09-30 LAB — COMPREHENSIVE METABOLIC PANEL
ALT: 42 U/L (ref 0–44)
AST: 34 U/L (ref 15–41)
Albumin: 3.1 g/dL — ABNORMAL LOW (ref 3.5–5.0)
Alkaline Phosphatase: 111 U/L (ref 38–126)
Anion gap: 5 (ref 5–15)
BUN: 21 mg/dL — ABNORMAL HIGH (ref 6–20)
CO2: 27 mmol/L (ref 22–32)
Calcium: 8.1 mg/dL — ABNORMAL LOW (ref 8.9–10.3)
Chloride: 104 mmol/L (ref 98–111)
Creatinine, Ser: 0.54 mg/dL (ref 0.44–1.00)
GFR, Estimated: >60 mL/min (ref >60)
Glucose, Bld: 106 mg/dL — ABNORMAL HIGH (ref 70–99)
Potassium: 3.3 mmol/L — ABNORMAL LOW (ref 3.5–5.1)
Sodium: 136 mmol/L (ref 135–145)
Total Bilirubin: 0.1 mg/dL — ABNORMAL LOW (ref 0.3–1.2)
Total Protein: 6.6 g/dL (ref 6.5–8.1)

## 2020-09-30 LAB — LACTIC ACID, PLASMA: Lactic Acid, Venous: 0.7 mmol/L (ref 0.5–1.9)

## 2020-09-30 LAB — D-DIMER, QUANTITATIVE: D-Dimer, Quant: 2.13 ug/mL-FEU — ABNORMAL HIGH (ref 0.00–0.50)

## 2020-09-30 IMAGING — CR DG CHEST 2V
2 series · 2 of 2 positions shown · non-contrast
Comparison: [DATE], [DATE], [DATE]

CLINICAL DATA: Right-sided chest pain for 2 days, history of breast
and lung cancer, fever

EXAM:
CHEST - 2 VIEW

[w chest pa]
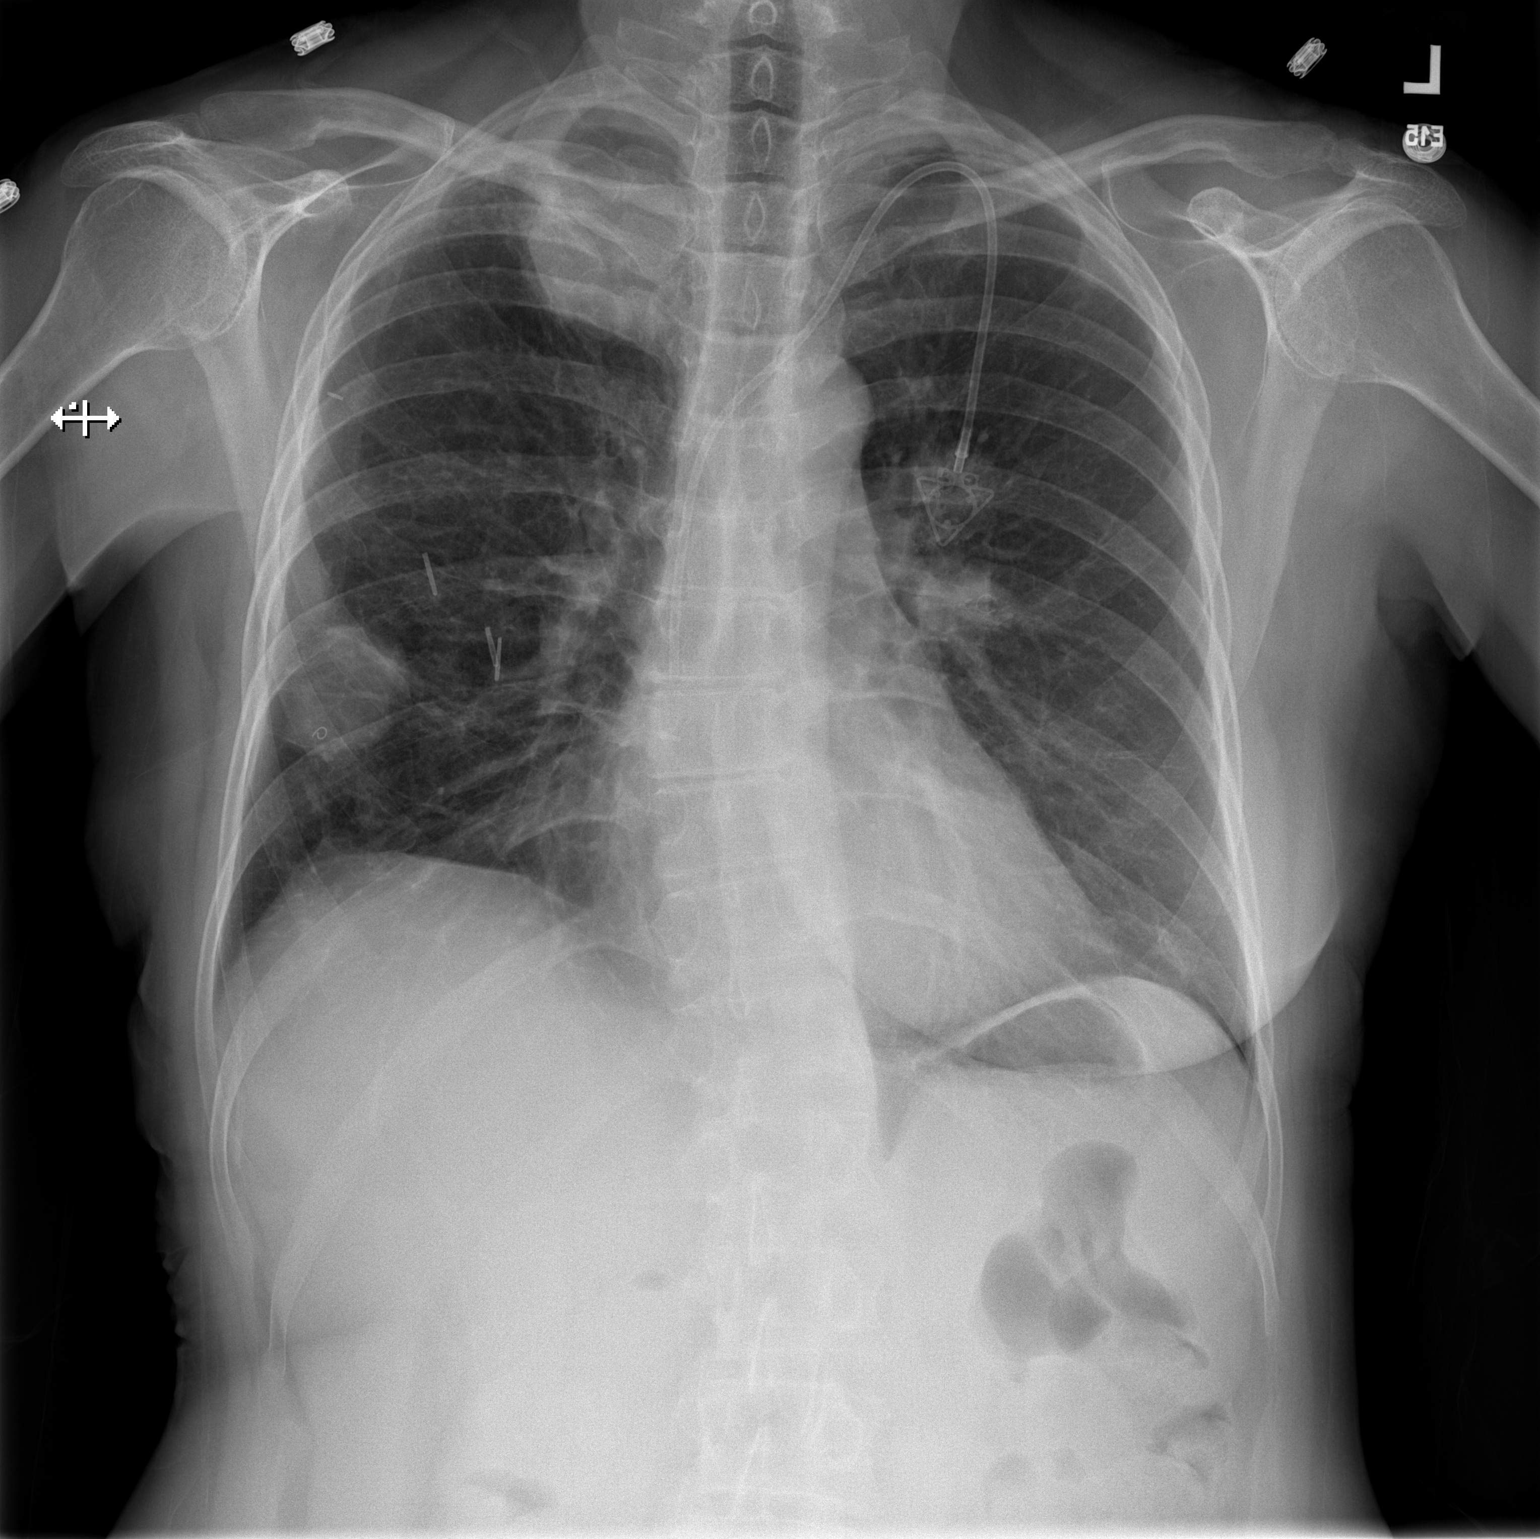

[w chest lat]
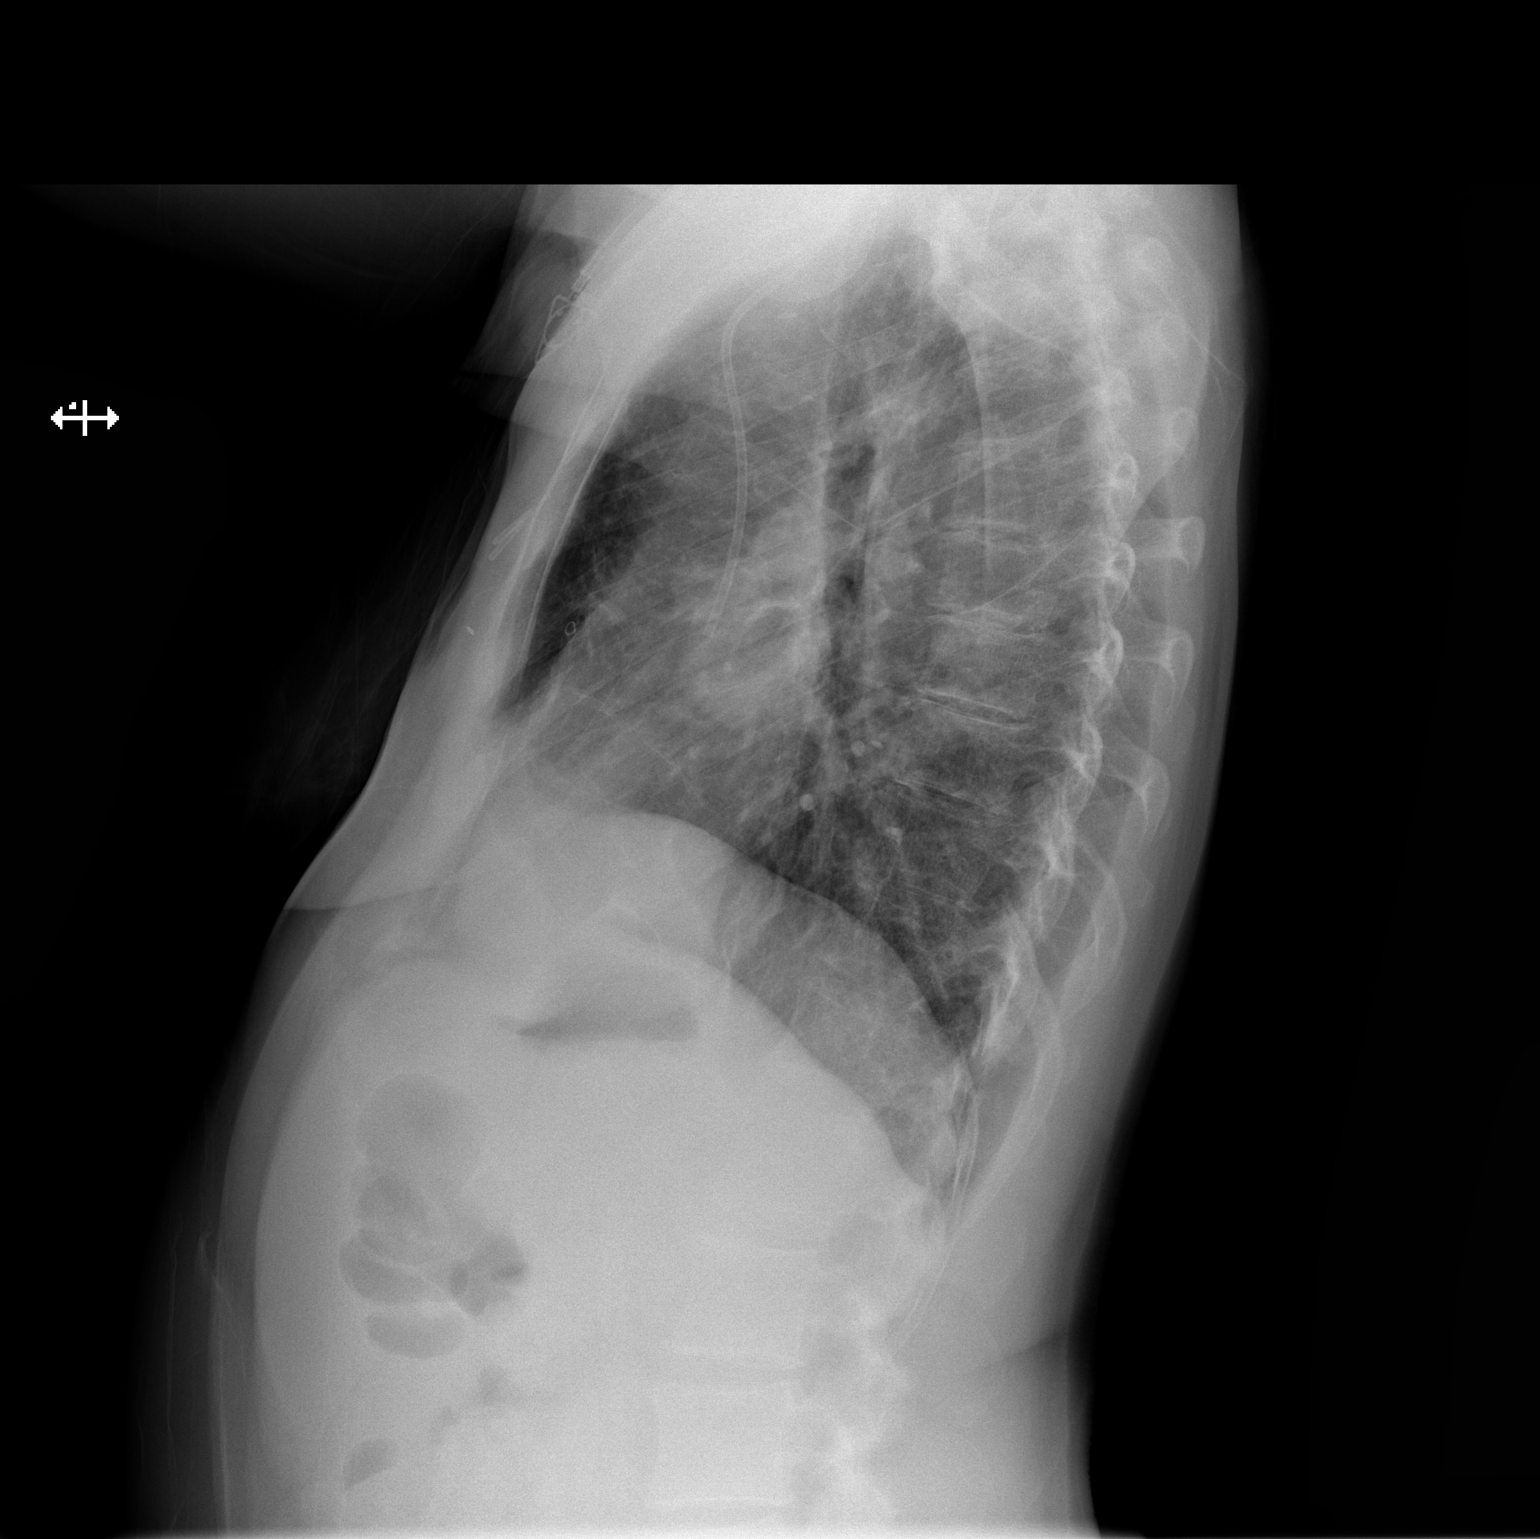

[2 of 2 positions shown; findings below may reference images not displayed]

FINDINGS: Frontal and lateral views of the chest demonstrates stable left
chest wall port. Multiple bilateral pulmonary masses are unchanged.
Large masses are seen within the right upper lobe, right middle
lobe, and left upper lobe compatible with history of metastatic
disease. No acute airspace disease, effusion, or pneumothorax. There
are no acute bony abnormalities.
IMPRESSION: 1. Stable bilateral lung masses consistent with metastatic disease.
There has been no change since [DATE], but these represent
progressive finding since [DATE]. No acute airspace disease.

## 2020-09-30 IMAGING — CT CT ANGIO CHEST
2 of 6 series · 16 of 36 positions shown · IV contrast (omnipaque)
Comparison: [DATE], [DATE], [DATE]
COMPARISON: [DATE], [DATE], [DATE]

Addendum:
CLINICAL DATA: Fever, cough, stage IV metastatic breast cancer,
recent [IF] diagnosis 2 weeks ago

EXAM:
CT ANGIOGRAPHY CHEST WITH CONTRAST
TECHNIQUE: Multidetector CT imaging of the chest was performed using the
standard protocol during bolus administration of intravenous
contrast. Multiplanar CT image reconstructions and MIPs were
obtained to evaluate the vascular anatomy.
CONTRAST:  80mL OMNIPAQUE IOHEXOL 350 MG/ML SOLN

[Series 5: thins · axial · 0.62mm/px · z∈[+1481,+1771]mm · 15 of 328 slices shown]
[im 19/328  lung]
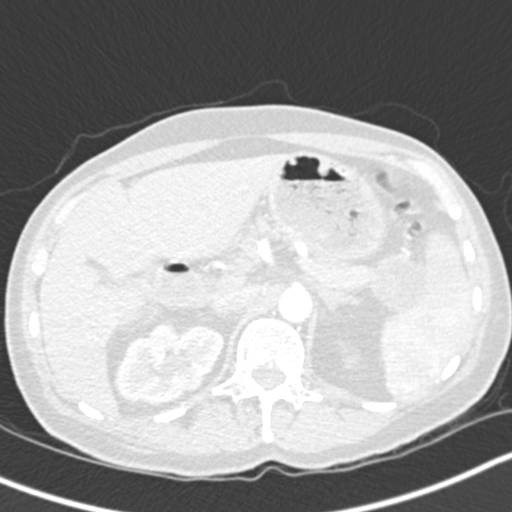
[im 37/328  mediastinal]
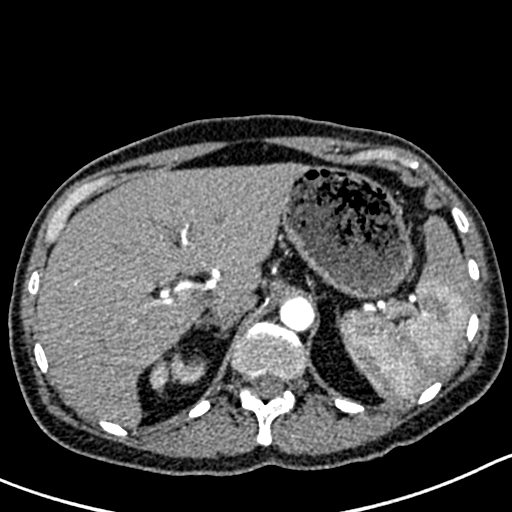
[im 55/328  lung]
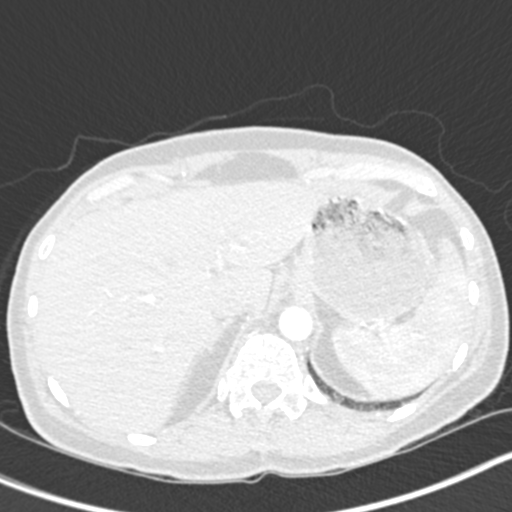
[im 73/328  mediastinal]
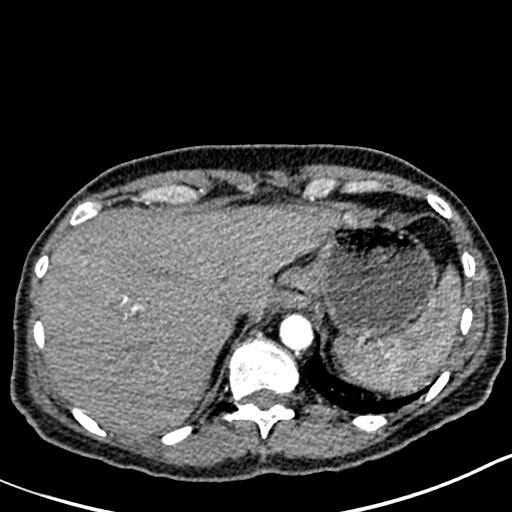
[im 110/328  lung]
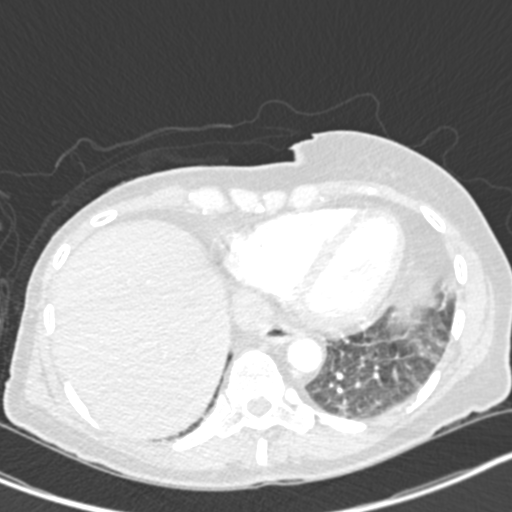
[im 128/328  mediastinal]
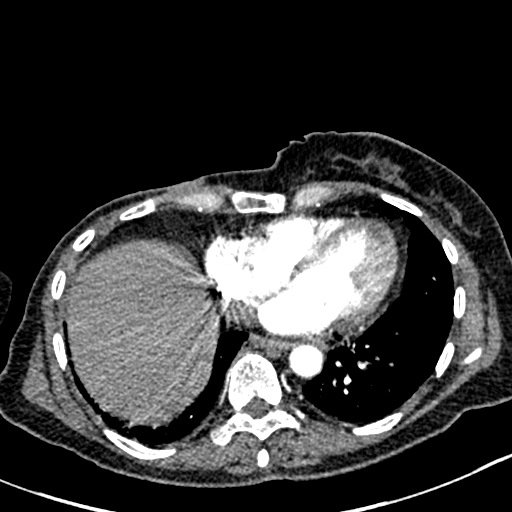
[im 146/328  lung]
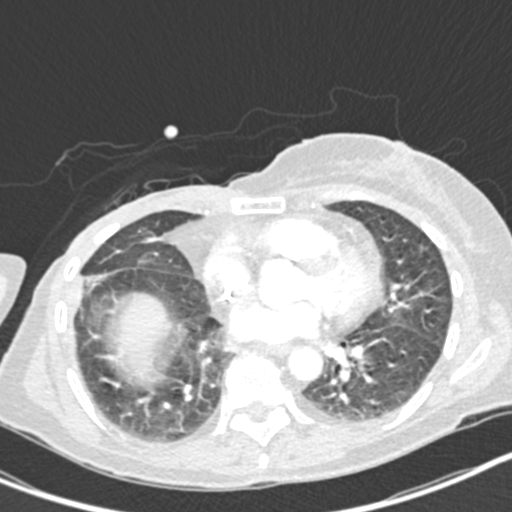
[im 164/328  mediastinal]
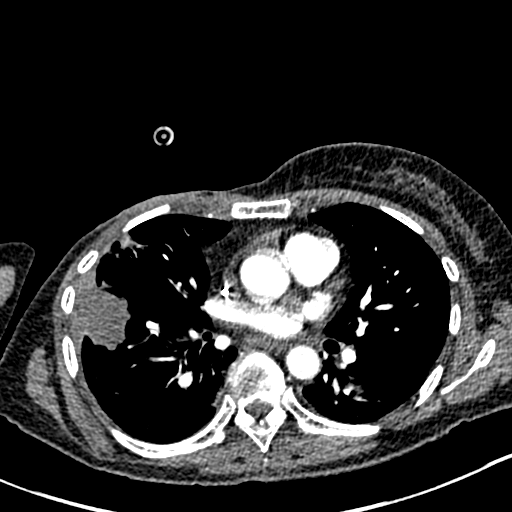
[im 182/328  lung]
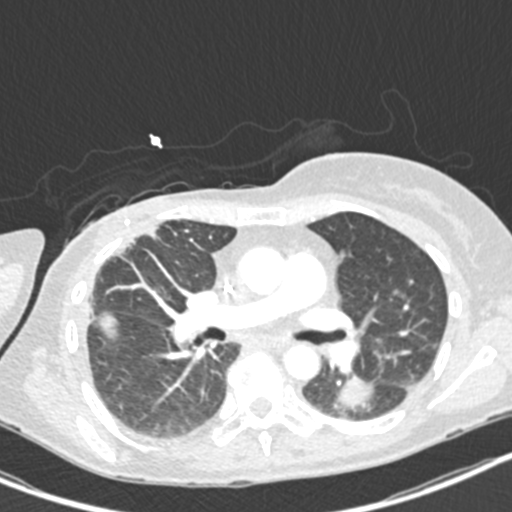
[im 200/328  mediastinal]
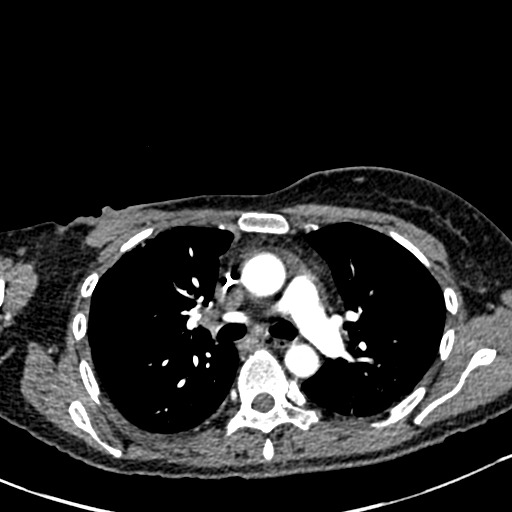
[im 219/328  lung]
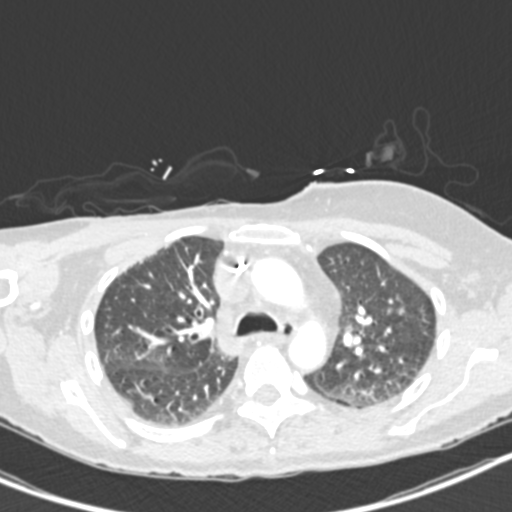
[im 255/328  mediastinal]
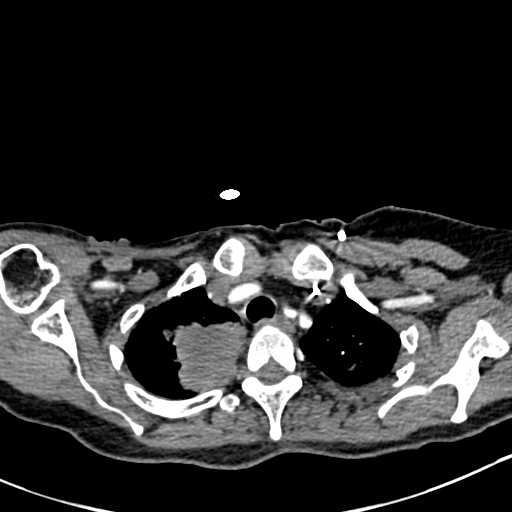
[im 273/328  lung]
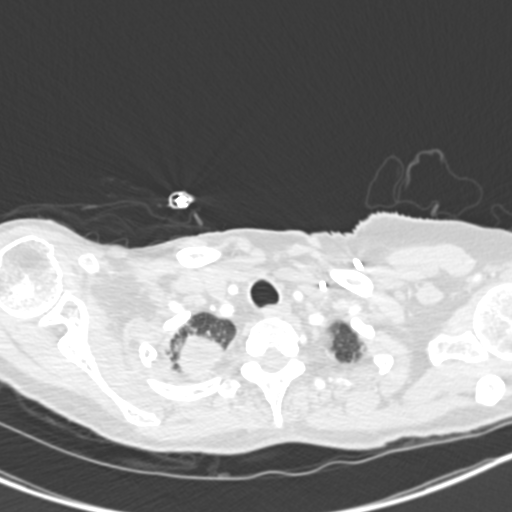
[im 291/328  mediastinal]
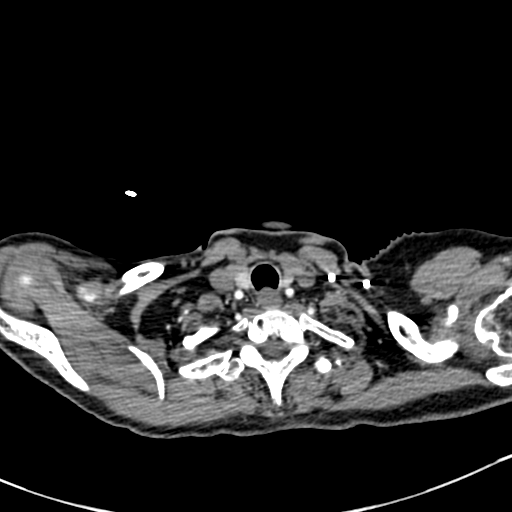
[im 309/328  lung]
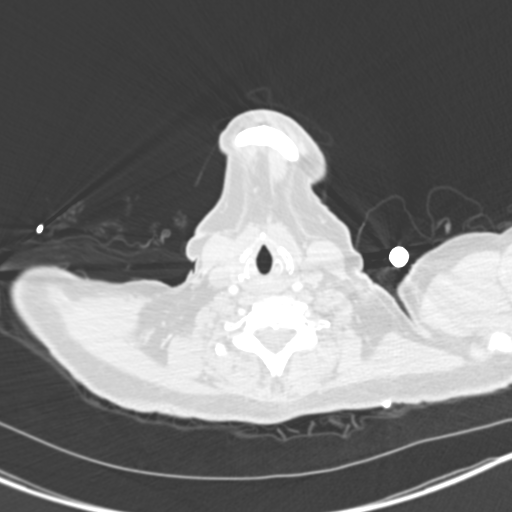

[Series 6: coronal mpr · coronal · 0.64mm/px · 1 of 108 slices shown]
[im 54/108  mediastinal]
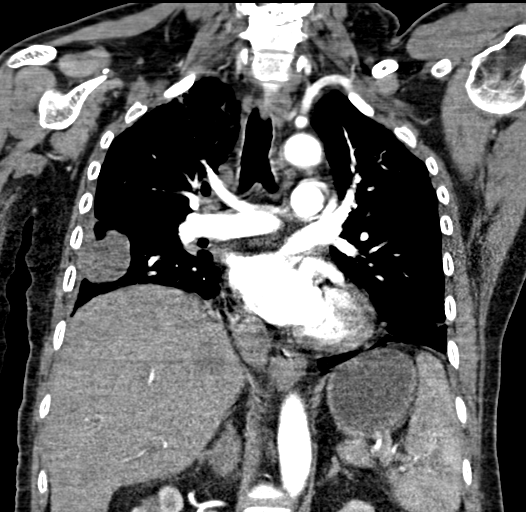

[16 of 36 positions shown; findings below may reference images not displayed]

FINDINGS: Cardiovascular: This is a technically adequate evaluation of the
pulmonary vasculature. There are no filling defects or pulmonary
emboli.

The heart is unremarkable without pericardial effusion. No evidence
of thoracic aortic aneurysm or dissection. Left chest wall port via
internal jugular approach, tip within the atriocaval junction.

Mediastinum/Nodes: Thyroid, trachea, and esophagus are unremarkable.
No pathologic mediastinal or hilar adenopathy. There is new
spiculated left axillary adenopathy measuring up to 1.5 x 1.9 cm
reference image 60/4.

Lungs/Pleura: Since the prior CT, bilateral pulmonary masses have
developed. Lesions are as follows:

Right upper lobe, image 37/10, 5.2 x 5.0 cm.

Right lower lobe, image 82/10, 3.8 x 3.0 cm.

Left lower lobe, image 78/10, 2.3 x 2.1 cm.

Peripheral consolidation within the anterior right upper and right
middle lobes likely reflects post radiation therapy changes. No
effusion or pneumothorax.

Upper Abdomen: Stable 1.4 cm hypodensity within the right lobe liver
compatible with a cyst. Indeterminate 0.8 cm hypodensity within the
periphery of the right lobe liver image 126/4 is new since prior
study, metastatic disease is not excluded. There is also been
interval development of a 3.7 x 3.0 cm hypodense mass at the
pancreatic tail abutting the splenic hilum, suspicious for
metastatic disease. Chronic areas of scarring are seen within the
kidneys.

Musculoskeletal: Postsurgical changes are seen from right
mastectomy. There are no acute displaced fractures. No destructive
bony lesions or evidence of bony metastases. Reconstructed images
demonstrate no additional findings.

Review of the MIP images confirms the above findings.
IMPRESSION: 1. Bilateral pulmonary masses compatible with known metastatic
breast cancer.
2. Interval development of a spiculated enlarged left axillary lymph
node consistent with progression of disease.
3. New lesions within the right lobe liver and pancreatic tail as
above, suspicious for metastatic disease.
4. No evidence of pulmonary embolus.

ADDENDUM:
Upon re-review of CTA chest performed [DATE], the following
findings are noted: There is a filling defect within a segmental
branch of the right upper lobe pulmonary artery compatible with
acute pulmonary embolus, image 63/6.

Critical Value/emergent results were called by telephone at the time
of interpretation on [DATE] at [DATE] to provider YASMIELLE
YASMIELLE , who verbally acknowledged these results.

*** End of Addendum ***
FINDINGS: Cardiovascular: This is a technically adequate evaluation of the
pulmonary vasculature. There are no filling defects or pulmonary
emboli.

The heart is unremarkable without pericardial effusion. No evidence
of thoracic aortic aneurysm or dissection. Left chest wall port via
internal jugular approach, tip within the atriocaval junction.

Mediastinum/Nodes: Thyroid, trachea, and esophagus are unremarkable.
No pathologic mediastinal or hilar adenopathy. There is new
spiculated left axillary adenopathy measuring up to 1.5 x 1.9 cm
reference image 60/4.

Lungs/Pleura: Since the prior CT, bilateral pulmonary masses have
developed. Lesions are as follows:

Right upper lobe, image 37/10, 5.2 x 5.0 cm.

Right lower lobe, image 82/10, 3.8 x 3.0 cm.

Left lower lobe, image 78/10, 2.3 x 2.1 cm.

Peripheral consolidation within the anterior right upper and right
middle lobes likely reflects post radiation therapy changes. No
effusion or pneumothorax.

Upper Abdomen: Stable 1.4 cm hypodensity within the right lobe liver
compatible with a cyst. Indeterminate 0.8 cm hypodensity within the
periphery of the right lobe liver image 126/4 is new since prior
study, metastatic disease is not excluded. There is also been
interval development of a 3.7 x 3.0 cm hypodense mass at the
pancreatic tail abutting the splenic hilum, suspicious for
metastatic disease. Chronic areas of scarring are seen within the
kidneys.

Musculoskeletal: Postsurgical changes are seen from right
mastectomy. There are no acute displaced fractures. No destructive
bony lesions or evidence of bony metastases. Reconstructed images
demonstrate no additional findings.

Review of the MIP images confirms the above findings.
IMPRESSION: 1. Bilateral pulmonary masses compatible with known metastatic
breast cancer.
2. Interval development of a spiculated enlarged left axillary lymph
node consistent with progression of disease.
3. New lesions within the right lobe liver and pancreatic tail as
above, suspicious for metastatic disease.
4. No evidence of pulmonary embolus.

## 2020-09-30 MED ORDER — OXYCODONE-ACETAMINOPHEN 5-325 MG PO TABS
1.0000 | ORAL_TABLET | Freq: Once | ORAL | Status: AC
  Administered 2020-09-30: 1 via ORAL
  Filled 2020-09-30: qty 1

## 2020-09-30 MED ORDER — DOXYCYCLINE HYCLATE 20 MG PO TABS
40.0000 mg | ORAL_TABLET | Freq: Every day | ORAL | 2 refills | Status: DC
  Filled 2020-09-30: qty 68, 34d supply, fill #0

## 2020-09-30 MED ORDER — IOHEXOL 350 MG/ML SOLN
80.0000 mL | Freq: Once | INTRAVENOUS | Status: AC | PRN
  Administered 2020-09-30: 80 mL via INTRAVENOUS

## 2020-09-30 MED ORDER — ONDANSETRON 8 MG PO TBDP
8.0000 mg | ORAL_TABLET | Freq: Once | ORAL | Status: AC
  Administered 2020-09-30: 8 mg via ORAL
  Filled 2020-09-30: qty 1

## 2020-09-30 NOTE — ED Notes (Signed)
Patient requesting port be deaccessed. Dr. Joya Gaskins agreed.
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
## 2020-09-30 NOTE — ED Triage Notes (Signed)
Pt reports fever and cough since last night. Pt is currently being treated at [facility] for stage 4 metastatic breast cancer. Pt had COVID about 2 weeks ago, but her symptoms have since resolved.

## 2020-09-30 NOTE — ED Provider Notes (Signed)
Emergency Medicine Provider Triage Evaluation Note  Kara Pittman , a 55 y.o. female  was evaluated in triage.  Pt complains of fever.  Patient is currently being treated at [facility] for stage IV metastatic breast cancer, she recently started to new chemotherapy drugs on August 9, had a COVID infection 2 weeks ago which she has recovered from and was no longer having fevers until last night when she had a fever of 102, took Tylenol with improvement fever again went up this morning to 100.7, and improved with Tylenol.  She called her oncologist and was instructed to come to the hospital.  She has had an intermittent cough ever since her COVID infection but otherwise denies focal infectious symptoms.  Reports that she has been feeling better since taking Tylenol.  Patient is very apprehensive about having blood cultures drawn but does have a port to access for blood work.  Review of Systems  Positive: Fever, cough Negative: Abdominal pain, vomiting  Physical Exam  BP 114/88 (BP Location: Left Arm)   Pulse (!) 126   Temp 98 F (36.7 C) (Oral)   Resp 16   SpO2 99%  Gen:   Awake, no distress   Resp:  Normal effort  MSK:   Moves extremities without difficulty  Other:    Medical Decision Making  Medically screening exam initiated at 5:45 PM.  Appropriate orders placed.  HARNOOR SHILLINGTON was informed that the remainder of the evaluation will be completed by another provider, this initial triage assessment does not replace that evaluation, and the importance of remaining in the ED until their evaluation is complete.     Jacqlyn Larsen, PA-C 09/30/20 1817    Lorelle Gibbs, DO 09/30/20 2312

## 2020-09-30 NOTE — ED Notes (Signed)
Patient refused lab draw.

## 2020-10-01 ENCOUNTER — Other Ambulatory Visit: Payer: Self-pay

## 2020-10-01 ENCOUNTER — Emergency Department (HOSPITAL_COMMUNITY): Payer: Medicaid Other

## 2020-10-01 ENCOUNTER — Encounter: Payer: Self-pay | Admitting: Hematology and Oncology

## 2020-10-01 ENCOUNTER — Inpatient Hospital Stay (HOSPITAL_COMMUNITY)
Admission: EM | Admit: 2020-10-01 | Discharge: 2020-10-03 | DRG: 176 | Disposition: A | Discharge status: Home / Self Care | Payer: Medicaid Other | Attending: Internal Medicine | Admitting: Internal Medicine

## 2020-10-01 ENCOUNTER — Encounter (HOSPITAL_COMMUNITY): Payer: Self-pay

## 2020-10-01 DIAGNOSIS — C50311 Malignant neoplasm of lower-inner quadrant of right female breast: Secondary | ICD-10-CM

## 2020-10-01 DIAGNOSIS — R509 Fever, unspecified: Secondary | ICD-10-CM | POA: Diagnosis present

## 2020-10-01 DIAGNOSIS — Z885 Allergy status to narcotic agent status: Secondary | ICD-10-CM

## 2020-10-01 DIAGNOSIS — Z806 Family history of leukemia: Secondary | ICD-10-CM

## 2020-10-01 DIAGNOSIS — Z171 Estrogen receptor negative status [ER-]: Secondary | ICD-10-CM

## 2020-10-01 DIAGNOSIS — E039 Hypothyroidism, unspecified: Secondary | ICD-10-CM | POA: Diagnosis not present

## 2020-10-01 DIAGNOSIS — Z808 Family history of malignant neoplasm of other organs or systems: Secondary | ICD-10-CM

## 2020-10-01 DIAGNOSIS — Z9011 Acquired absence of right breast and nipple: Secondary | ICD-10-CM

## 2020-10-01 DIAGNOSIS — Z79899 Other long term (current) drug therapy: Secondary | ICD-10-CM

## 2020-10-01 DIAGNOSIS — C787 Secondary malignant neoplasm of liver and intrahepatic bile duct: Secondary | ICD-10-CM | POA: Diagnosis present

## 2020-10-01 DIAGNOSIS — I2699 Other pulmonary embolism without acute cor pulmonale: Secondary | ICD-10-CM | POA: Diagnosis not present

## 2020-10-01 DIAGNOSIS — F419 Anxiety disorder, unspecified: Secondary | ICD-10-CM | POA: Diagnosis present

## 2020-10-01 DIAGNOSIS — Z20822 Contact with and (suspected) exposure to covid-19: Secondary | ICD-10-CM | POA: Diagnosis present

## 2020-10-01 DIAGNOSIS — M1990 Unspecified osteoarthritis, unspecified site: Secondary | ICD-10-CM | POA: Diagnosis present

## 2020-10-01 DIAGNOSIS — C7802 Secondary malignant neoplasm of left lung: Secondary | ICD-10-CM | POA: Diagnosis present

## 2020-10-01 DIAGNOSIS — C7989 Secondary malignant neoplasm of other specified sites: Secondary | ICD-10-CM | POA: Diagnosis present

## 2020-10-01 DIAGNOSIS — E876 Hypokalemia: Secondary | ICD-10-CM

## 2020-10-01 DIAGNOSIS — C7889 Secondary malignant neoplasm of other digestive organs: Secondary | ICD-10-CM | POA: Diagnosis present

## 2020-10-01 DIAGNOSIS — C50811 Malignant neoplasm of overlapping sites of right female breast: Secondary | ICD-10-CM | POA: Diagnosis present

## 2020-10-01 DIAGNOSIS — C7801 Secondary malignant neoplasm of right lung: Secondary | ICD-10-CM | POA: Diagnosis present

## 2020-10-01 DIAGNOSIS — Z8616 Personal history of COVID-19: Secondary | ICD-10-CM

## 2020-10-01 DIAGNOSIS — Y842 Radiological procedure and radiotherapy as the cause of abnormal reaction of the patient, or of later complication, without mention of misadventure at the time of the procedure: Secondary | ICD-10-CM | POA: Diagnosis present

## 2020-10-01 DIAGNOSIS — D630 Anemia in neoplastic disease: Secondary | ICD-10-CM | POA: Diagnosis present

## 2020-10-01 DIAGNOSIS — F32A Depression, unspecified: Secondary | ICD-10-CM | POA: Diagnosis present

## 2020-10-01 DIAGNOSIS — G43909 Migraine, unspecified, not intractable, without status migrainosus: Secondary | ICD-10-CM | POA: Diagnosis present

## 2020-10-01 DIAGNOSIS — Z886 Allergy status to analgesic agent status: Secondary | ICD-10-CM

## 2020-10-01 DIAGNOSIS — Z9221 Personal history of antineoplastic chemotherapy: Secondary | ICD-10-CM

## 2020-10-01 DIAGNOSIS — T66XXXS Radiation sickness, unspecified, sequela: Secondary | ICD-10-CM

## 2020-10-01 DIAGNOSIS — Z8249 Family history of ischemic heart disease and other diseases of the circulatory system: Secondary | ICD-10-CM

## 2020-10-01 DIAGNOSIS — G893 Neoplasm related pain (acute) (chronic): Secondary | ICD-10-CM | POA: Diagnosis present

## 2020-10-01 DIAGNOSIS — Z881 Allergy status to other antibiotic agents status: Secondary | ICD-10-CM

## 2020-10-01 DIAGNOSIS — J45909 Unspecified asthma, uncomplicated: Secondary | ICD-10-CM | POA: Diagnosis present

## 2020-10-01 DIAGNOSIS — D638 Anemia in other chronic diseases classified elsewhere: Secondary | ICD-10-CM | POA: Diagnosis present

## 2020-10-01 DIAGNOSIS — D6859 Other primary thrombophilia: Secondary | ICD-10-CM | POA: Diagnosis present

## 2020-10-01 DIAGNOSIS — K219 Gastro-esophageal reflux disease without esophagitis: Secondary | ICD-10-CM | POA: Diagnosis present

## 2020-10-01 DIAGNOSIS — Z801 Family history of malignant neoplasm of trachea, bronchus and lung: Secondary | ICD-10-CM

## 2020-10-01 LAB — COMPREHENSIVE METABOLIC PANEL
ALT: 37 U/L (ref 0–44)
AST: 24 U/L (ref 15–41)
Albumin: 3.3 g/dL — ABNORMAL LOW (ref 3.5–5.0)
Alkaline Phosphatase: 100 U/L (ref 38–126)
Anion gap: 9 (ref 5–15)
BUN: 13 mg/dL (ref 6–20)
CO2: 26 mmol/L (ref 22–32)
Calcium: 8 mg/dL — ABNORMAL LOW (ref 8.9–10.3)
Chloride: 100 mmol/L (ref 98–111)
Creatinine, Ser: 0.63 mg/dL (ref 0.44–1.00)
GFR, Estimated: >60 mL/min (ref >60)
Glucose, Bld: 106 mg/dL — ABNORMAL HIGH (ref 70–99)
Potassium: 3 mmol/L — ABNORMAL LOW (ref 3.5–5.1)
Sodium: 135 mmol/L (ref 135–145)
Total Bilirubin: 0.5 mg/dL (ref 0.3–1.2)
Total Protein: 7.1 g/dL (ref 6.5–8.1)

## 2020-10-01 LAB — CBC WITH DIFFERENTIAL/PLATELET
Abs Immature Granulocytes: 0.06 10*3/uL (ref 0.00–0.07)
Basophils Absolute: 0 10*3/uL (ref 0.0–0.1)
Basophils Relative: 0 %
Eosinophils Absolute: 0 10*3/uL (ref 0.0–0.5)
Eosinophils Relative: 0 %
HCT: 29.5 % — ABNORMAL LOW (ref 36.0–46.0)
Hemoglobin: 9.5 g/dL — ABNORMAL LOW (ref 12.0–15.0)
Immature Granulocytes: 1 %
Lymphocytes Relative: 10 %
Lymphs Abs: 0.9 10*3/uL (ref 0.7–4.0)
MCH: 32 pg (ref 26.0–34.0)
MCHC: 32.2 g/dL (ref 30.0–36.0)
MCV: 99.3 fL (ref 80.0–100.0)
Monocytes Absolute: 0.6 10*3/uL (ref 0.1–1.0)
Monocytes Relative: 7 %
Neutro Abs: 7.4 10*3/uL (ref 1.7–7.7)
Neutrophils Relative %: 82 %
Platelets: 168 10*3/uL (ref 150–400)
RBC: 2.97 MIL/uL — ABNORMAL LOW (ref 3.87–5.11)
RDW: 13.4 % (ref 11.5–15.5)
WBC: 9 10*3/uL (ref 4.0–10.5)
nRBC: 0 % (ref 0.0–0.2)

## 2020-10-01 LAB — APTT: aPTT: 33 seconds (ref 24–36)

## 2020-10-01 LAB — URINALYSIS, ROUTINE W REFLEX MICROSCOPIC
Bacteria, UA: NONE SEEN
Bilirubin Urine: NEGATIVE
Glucose, UA: NEGATIVE mg/dL
Ketones, ur: NEGATIVE mg/dL
Nitrite: NEGATIVE
Protein, ur: NEGATIVE mg/dL
Specific Gravity, Urine: 1.04 — ABNORMAL HIGH (ref 1.005–1.030)
pH: 6 (ref 5.0–8.0)

## 2020-10-01 LAB — PROTIME-INR
INR: 1.1 (ref 0.8–1.2)
Prothrombin Time: 14 seconds (ref 11.4–15.2)

## 2020-10-01 LAB — HEPARIN LEVEL (UNFRACTIONATED): Heparin Unfractionated: <0.1 IU/mL — ABNORMAL LOW (ref 0.30–0.70)

## 2020-10-01 LAB — TROPONIN I (HIGH SENSITIVITY): Troponin I (High Sensitivity): 2 ng/L (ref <18)

## 2020-10-01 LAB — BRAIN NATRIURETIC PEPTIDE: B Natriuretic Peptide: 77.3 pg/mL (ref 0.0–100.0)

## 2020-10-01 MED ORDER — HEPARIN BOLUS VIA INFUSION
1700.0000 [IU] | Freq: Once | INTRAVENOUS | Status: AC
  Administered 2020-10-01: 1700 [IU] via INTRAVENOUS
  Filled 2020-10-01: qty 1700

## 2020-10-01 MED ORDER — ONDANSETRON HCL 4 MG PO TABS: 4.0000 mg | ORAL_TABLET | Freq: Four times a day (QID) | ORAL | Status: DC | PRN

## 2020-10-01 MED ORDER — MORPHINE SULFATE (PF) 2 MG/ML IV SOLN
2.0000 mg | Freq: Once | INTRAVENOUS | Status: DC
Start: 2020-10-01 — End: 2020-10-01

## 2020-10-01 MED ORDER — HYDROCOD POLST-CPM POLST ER 10-8 MG/5ML PO SUER
5.0000 mL | Freq: Two times a day (BID) | ORAL | Status: DC | PRN
  Administered 2020-10-01 – 2020-10-03 (×4): 5 mL via ORAL
  Filled 2020-10-01 (×5): qty 5

## 2020-10-01 MED ORDER — BENZONATATE 100 MG PO CAPS
100.0000 mg | ORAL_CAPSULE | Freq: Two times a day (BID) | ORAL | Status: DC | PRN
  Administered 2020-10-02 – 2020-10-03 (×2): 100 mg via ORAL
  Filled 2020-10-01 (×2): qty 1

## 2020-10-01 MED ORDER — HEPARIN BOLUS VIA INFUSION
2500.0000 [IU] | Freq: Once | INTRAVENOUS | Status: AC
  Administered 2020-10-01: 2500 [IU] via INTRAVENOUS
  Filled 2020-10-01: qty 2500

## 2020-10-01 MED ORDER — LORATADINE 10 MG PO TABS
10.0000 mg | ORAL_TABLET | Freq: Every day | ORAL | Status: DC
  Administered 2020-10-02 – 2020-10-03 (×2): 10 mg via ORAL
  Filled 2020-10-01 (×2): qty 1

## 2020-10-01 MED ORDER — CELECOXIB 200 MG PO CAPS
200.0000 mg | ORAL_CAPSULE | Freq: Every day | ORAL | Status: DC
  Administered 2020-10-02 – 2020-10-03 (×2): 200 mg via ORAL
  Filled 2020-10-01 (×2): qty 1

## 2020-10-01 MED ORDER — OXYCODONE-ACETAMINOPHEN 5-325 MG PO TABS
1.0000 | ORAL_TABLET | ORAL | Status: DC | PRN
  Administered 2020-10-01 – 2020-10-02 (×5): 1 via ORAL
  Filled 2020-10-01 (×5): qty 1

## 2020-10-01 MED ORDER — ONDANSETRON HCL 4 MG/2ML IJ SOLN: 4.0000 mg | Freq: Four times a day (QID) | INTRAMUSCULAR | Status: DC | PRN

## 2020-10-01 MED ORDER — FENTANYL CITRATE PF 50 MCG/ML IJ SOSY
12.5000 ug | PREFILLED_SYRINGE | Freq: Once | INTRAMUSCULAR | Status: AC
Start: 2020-10-01 — End: 2020-10-01
  Administered 2020-10-01: 12.5 ug via INTRAVENOUS
  Filled 2020-10-01: qty 1

## 2020-10-01 MED ORDER — DOXYCYCLINE HYCLATE 20 MG PO TABS: 40.0000 mg | ORAL_TABLET | Freq: Every day | ORAL | Status: DC

## 2020-10-01 MED ORDER — POTASSIUM CHLORIDE CRYS ER 20 MEQ PO TBCR
40.0000 meq | EXTENDED_RELEASE_TABLET | Freq: Once | ORAL | Status: AC
  Administered 2020-10-01: 40 meq via ORAL
  Filled 2020-10-01: qty 2

## 2020-10-01 MED ORDER — DOXYCYCLINE HYCLATE 100 MG PO TABS
50.0000 mg | ORAL_TABLET | Freq: Every day | ORAL | Status: DC
  Administered 2020-10-02 – 2020-10-03 (×2): 50 mg via ORAL
  Filled 2020-10-01 (×2): qty 1

## 2020-10-01 MED ORDER — GUAIFENESIN 100 MG/5ML PO SOLN
5.0000 mL | ORAL | Status: DC | PRN
  Administered 2020-10-02 (×2): 100 mg via ORAL
  Filled 2020-10-01 (×3): qty 10

## 2020-10-01 MED ORDER — ACETAMINOPHEN 325 MG PO TABS: 650.0000 mg | ORAL_TABLET | Freq: Four times a day (QID) | ORAL | Status: DC | PRN

## 2020-10-01 MED ORDER — HEPARIN (PORCINE) 25000 UT/250ML-% IV SOLN
1050.0000 [IU]/h | INTRAVENOUS | Status: DC
  Administered 2020-10-01: 1050 [IU]/h via INTRAVENOUS
  Filled 2020-10-01: qty 250

## 2020-10-01 MED ORDER — BUPROPION HCL ER (XL) 150 MG PO TB24
150.0000 mg | ORAL_TABLET | Freq: Every day | ORAL | Status: DC
  Administered 2020-10-02 – 2020-10-03 (×2): 150 mg via ORAL
  Filled 2020-10-01 (×2): qty 1

## 2020-10-01 MED ORDER — LEVOTHYROXINE SODIUM 50 MCG PO TABS
75.0000 ug | ORAL_TABLET | Freq: Every day | ORAL | Status: DC
  Administered 2020-10-02 – 2020-10-03 (×2): 75 ug via ORAL
  Filled 2020-10-01 (×2): qty 1

## 2020-10-01 MED ORDER — HEPARIN (PORCINE) 25000 UT/250ML-% IV SOLN
1550.0000 [IU]/h | INTRAVENOUS | Status: AC
  Administered 2020-10-02: 1400 [IU]/h via INTRAVENOUS
  Filled 2020-10-01 (×4): qty 250

## 2020-10-01 NOTE — ED Provider Notes (Signed)
Radiology called me back on the phone about CT scan that was performed yesterday that was initially read as no blood clot but on review today there does appear to be segmental blood clot.  Patient was called on the phone and told to come back to the ED for evaluation and likely admission for treatment of blood clots.  She will likely need DVT studies as well.  Undergoing treatment for breast cancer currently.  This chart was dictated using voice recognition software.  Despite best efforts to proofread,  errors can occur which can change the documentation meaning.    Lennice Sites, DO 10/01/20 [phone]

## 2020-10-01 NOTE — Progress Notes (Addendum)
ANTICOAGULATION CONSULT NOTE - Initial Consult  Pharmacy Consult for Heparin Indication: pulmonary embolus  Allergies  Allergen Reactions   Hydrocodone-Acetaminophen Nausea Only   Morphine And Related Hives   Vancomycin Other (See Comments)    Redman's Syndrome    Patient Measurements:   Heparin Dosing Weight:   Vital Signs: Temp: 98 F (36.7 C) (08/27 1124) Temp Source: Oral (08/27 1124) BP: 110/76 (08/27 1124) Pulse Rate: 116 (08/27 1124)  Labs: Recent Labs    09/30/20 2046  HGB 9.2*  HCT 28.9*  PLT 147*  CREATININE 0.54    CrCl cannot be calculated (Unknown ideal weight.).   Medical History: Past Medical History:  Diagnosis Date   Arthritis    Asthma    as a child   Cancer ([place])    Breast ; melonoma - Back and right ear   Complication of anesthesia    Depression    situational   Family history of leukemia    Family history of lung cancer    Family history of skin cancer    Family history of throat cancer    Headache    migraine - decrease number   Hypothyroidism    Personal history of chemotherapy    Started 12/30/18 and currently still having treatments   PONV (postoperative nausea and vomiting)    Violently sick    Medications:  Scheduled:   heparin  1,700 Units Intravenous Once   Infusions:   sodium chloride     heparin     PRN: sodium chloride  Assessment: 55 yo female with state IV metastatic breast cancer on chemotherapy at [facility] presents with +PE on CT.  Pharmacy consulted to dose IV heparin.  Patient is not on anticoagulation PTA.  Goal of Therapy:  Heparin level 0.3-0.7 units/ml Monitor platelets by anticoagulation protocol: Yes   Plan:  Give 1700 units bolus x 1 Start heparin infusion at 1050 units/hr Check anti-Xa level in 6 hours and daily while on heparin Continue to monitor H&H and platelets  Peggyann Juba, PharmD, BCPS Pharmacy: [phone] 10/01/2020,11:45 AM   Addendum: First heparin level undetectable -  verified with RN that infusion is running correctly.   No bleeding reported.  Plan: Re-bolus heparin 2500 units IV x 1 Increase heparin infusion to 1200 units/hr Recheck heparin level in [place], PharmD, BCPS 10/01/2020 8:10 PM

## 2020-10-01 NOTE — ED Triage Notes (Signed)
Pt was seen here yesterday and has a chest CTA performed. Pt was called back today after a re-review of her scan and told to come back to the ER to be treated for a PE.

## 2020-10-01 NOTE — ED Provider Notes (Signed)
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 without acute cor pulmonale, unspecified pulmonary embolism type [place]  Malignant neoplasm of lower-inner quadrant of right breast of female, estrogen receptor negative ([facility]    [place] / [place]     None        HOllen Gross08/27/22 2046    CLennice Sites DO 10/02/20 0732

## 2020-10-01 NOTE — Progress Notes (Signed)
Lower extremity venous bilateral and upper extremity venous RT study completed.  Preliminary results relayed to [place], [place].  See CV Proc for preliminary results report.   Darlin Coco, RDMS, RVT
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
## 2020-10-02 ENCOUNTER — Observation Stay (HOSPITAL_COMMUNITY): Payer: Medicaid Other

## 2020-10-02 DIAGNOSIS — E876 Hypokalemia: Secondary | ICD-10-CM | POA: Diagnosis not present

## 2020-10-02 DIAGNOSIS — Z8249 Family history of ischemic heart disease and other diseases of the circulatory system: Secondary | ICD-10-CM | POA: Diagnosis not present

## 2020-10-02 DIAGNOSIS — C7889 Secondary malignant neoplasm of other digestive organs: Secondary | ICD-10-CM | POA: Diagnosis not present

## 2020-10-02 DIAGNOSIS — Z806 Family history of leukemia: Secondary | ICD-10-CM | POA: Diagnosis not present

## 2020-10-02 DIAGNOSIS — C7802 Secondary malignant neoplasm of left lung: Secondary | ICD-10-CM | POA: Diagnosis not present

## 2020-10-02 DIAGNOSIS — E039 Hypothyroidism, unspecified: Secondary | ICD-10-CM | POA: Diagnosis not present

## 2020-10-02 DIAGNOSIS — Z8616 Personal history of COVID-19: Secondary | ICD-10-CM | POA: Diagnosis not present

## 2020-10-02 DIAGNOSIS — Z808 Family history of malignant neoplasm of other organs or systems: Secondary | ICD-10-CM | POA: Diagnosis not present

## 2020-10-02 DIAGNOSIS — T66XXXS Radiation sickness, unspecified, sequela: Secondary | ICD-10-CM | POA: Diagnosis not present

## 2020-10-02 DIAGNOSIS — I2602 Saddle embolus of pulmonary artery with acute cor pulmonale: Secondary | ICD-10-CM | POA: Diagnosis not present

## 2020-10-02 DIAGNOSIS — G893 Neoplasm related pain (acute) (chronic): Secondary | ICD-10-CM | POA: Diagnosis not present

## 2020-10-02 DIAGNOSIS — C7989 Secondary malignant neoplasm of other specified sites: Secondary | ICD-10-CM | POA: Diagnosis not present

## 2020-10-02 DIAGNOSIS — Z801 Family history of malignant neoplasm of trachea, bronchus and lung: Secondary | ICD-10-CM | POA: Diagnosis not present

## 2020-10-02 DIAGNOSIS — Z171 Estrogen receptor negative status [ER-]: Secondary | ICD-10-CM | POA: Diagnosis not present

## 2020-10-02 DIAGNOSIS — Z20822 Contact with and (suspected) exposure to covid-19: Secondary | ICD-10-CM | POA: Diagnosis not present

## 2020-10-02 DIAGNOSIS — J45909 Unspecified asthma, uncomplicated: Secondary | ICD-10-CM | POA: Diagnosis not present

## 2020-10-02 DIAGNOSIS — Z515 Encounter for palliative care: Secondary | ICD-10-CM | POA: Diagnosis not present

## 2020-10-02 DIAGNOSIS — C787 Secondary malignant neoplasm of liver and intrahepatic bile duct: Secondary | ICD-10-CM | POA: Diagnosis not present

## 2020-10-02 DIAGNOSIS — C7801 Secondary malignant neoplasm of right lung: Secondary | ICD-10-CM | POA: Diagnosis not present

## 2020-10-02 DIAGNOSIS — D630 Anemia in neoplastic disease: Secondary | ICD-10-CM | POA: Diagnosis not present

## 2020-10-02 DIAGNOSIS — Y842 Radiological procedure and radiotherapy as the cause of abnormal reaction of the patient, or of later complication, without mention of misadventure at the time of the procedure: Secondary | ICD-10-CM | POA: Diagnosis present

## 2020-10-02 DIAGNOSIS — I2699 Other pulmonary embolism without acute cor pulmonale: Secondary | ICD-10-CM | POA: Diagnosis not present

## 2020-10-02 DIAGNOSIS — C50811 Malignant neoplasm of overlapping sites of right female breast: Secondary | ICD-10-CM | POA: Diagnosis not present

## 2020-10-02 DIAGNOSIS — F419 Anxiety disorder, unspecified: Secondary | ICD-10-CM | POA: Diagnosis not present

## 2020-10-02 DIAGNOSIS — F32A Depression, unspecified: Secondary | ICD-10-CM | POA: Diagnosis not present

## 2020-10-02 DIAGNOSIS — D6859 Other primary thrombophilia: Secondary | ICD-10-CM | POA: Diagnosis not present

## 2020-10-02 DIAGNOSIS — C50311 Malignant neoplasm of lower-inner quadrant of right female breast: Secondary | ICD-10-CM | POA: Diagnosis not present

## 2020-10-02 LAB — HEPARIN LEVEL (UNFRACTIONATED)
Heparin Unfractionated: <0.1 IU/mL — ABNORMAL LOW (ref 0.30–0.70)
Heparin Unfractionated: 0.19 IU/mL — ABNORMAL LOW (ref 0.30–0.70)
Heparin Unfractionated: 0.74 IU/mL — ABNORMAL HIGH (ref 0.30–0.70)

## 2020-10-02 LAB — COMPREHENSIVE METABOLIC PANEL
ALT: 38 U/L (ref 0–44)
AST: 22 U/L (ref 15–41)
Albumin: 3 g/dL — ABNORMAL LOW (ref 3.5–5.0)
Alkaline Phosphatase: 92 U/L (ref 38–126)
Anion gap: 8 (ref 5–15)
BUN: 10 mg/dL (ref 6–20)
CO2: 26 mmol/L (ref 22–32)
Calcium: 8 mg/dL — ABNORMAL LOW (ref 8.9–10.3)
Chloride: 103 mmol/L (ref 98–111)
Creatinine, Ser: 0.64 mg/dL (ref 0.44–1.00)
GFR, Estimated: >60 mL/min (ref >60)
Glucose, Bld: 111 mg/dL — ABNORMAL HIGH (ref 70–99)
Potassium: 3.7 mmol/L (ref 3.5–5.1)
Sodium: 137 mmol/L (ref 135–145)
Total Bilirubin: 0.3 mg/dL (ref 0.3–1.2)
Total Protein: 6.7 g/dL (ref 6.5–8.1)

## 2020-10-02 LAB — CBC
HCT: 30.1 % — ABNORMAL LOW (ref 36.0–46.0)
Hemoglobin: 9.6 g/dL — ABNORMAL LOW (ref 12.0–15.0)
MCH: 32 pg (ref 26.0–34.0)
MCHC: 31.9 g/dL (ref 30.0–36.0)
MCV: 100.3 fL — ABNORMAL HIGH (ref 80.0–100.0)
Platelets: 186 10*3/uL (ref 150–400)
RBC: 3 MIL/uL — ABNORMAL LOW (ref 3.87–5.11)
RDW: 13.5 % (ref 11.5–15.5)
WBC: 8.7 10*3/uL (ref 4.0–10.5)
nRBC: 0 % (ref 0.0–0.2)

## 2020-10-02 LAB — ECHOCARDIOGRAM COMPLETE
Area-P 1/2: 3.99 cm2
Calc EF: 66.5 %
Height: 64 in
S' Lateral: 2.9 cm
Single Plane A2C EF: 70.7 %
Single Plane A4C EF: 59.5 %
Weight: 2128 oz

## 2020-10-02 LAB — URINE CULTURE: Culture: NO GROWTH

## 2020-10-02 LAB — MAGNESIUM: Magnesium: 2.4 mg/dL (ref 1.7–2.4)

## 2020-10-02 LAB — SARS CORONAVIRUS 2 (TAT 6-24 HRS): SARS Coronavirus 2: NEGATIVE

## 2020-10-02 MED ORDER — HEPARIN BOLUS VIA INFUSION
2000.0000 [IU] | Freq: Once | INTRAVENOUS | Status: AC
  Administered 2020-10-02: 2000 [IU] via INTRAVENOUS
  Filled 2020-10-02: qty 2000

## 2020-10-02 MED ORDER — FENTANYL CITRATE PF 50 MCG/ML IJ SOSY
12.5000 ug | PREFILLED_SYRINGE | INTRAMUSCULAR | Status: DC | PRN
  Administered 2020-10-02 (×2): 12.5 ug via INTRAVENOUS
  Filled 2020-10-02 (×2): qty 1

## 2020-10-02 MED ORDER — HEPARIN BOLUS VIA INFUSION
1800.0000 [IU] | Freq: Once | INTRAVENOUS | Status: AC
  Administered 2020-10-02: 1800 [IU] via INTRAVENOUS

## 2020-10-02 MED ORDER — OXYCODONE-ACETAMINOPHEN 5-325 MG PO TABS
2.0000 | ORAL_TABLET | ORAL | Status: DC | PRN
  Administered 2020-10-02 – 2020-10-03 (×4): 2 via ORAL
  Filled 2020-10-02 (×4): qty 2

## 2020-10-02 MED ORDER — FENTANYL CITRATE PF 50 MCG/ML IJ SOSY
25.0000 ug | PREFILLED_SYRINGE | INTRAMUSCULAR | Status: DC | PRN
  Administered 2020-10-02 – 2020-10-03 (×3): 25 ug via INTRAVENOUS
  Filled 2020-10-02 (×3): qty 1

## 2020-10-02 NOTE — Progress Notes (Signed)
  Echocardiogram 2D Echocardiogram has been performed.  Kara Pittman 10/02/2020, 11:11 AM

## 2020-10-02 NOTE — Progress Notes (Signed)
ANTICOAGULATION CONSULT NOTE - follow up  Pharmacy Consult for Heparin Indication: pulmonary embolus  Allergies  Allergen Reactions   Morphine And Related Hives   Hydrocodone-Acetaminophen Nausea Only   Vancomycin Other (See Comments)    Redman's Syndrome    Patient Measurements: Height: '5\' 4"'$  (162.6 cm) Weight: 60.3 kg (133 lb) IBW/kg (Calculated) : 54.7 Heparin Dosing Weight:   Vital Signs: Temp: 98.2 F (36.8 C) (08/28 0700) Temp Source: Oral (08/28 0700) BP: 98/70 (08/28 0700) Pulse Rate: 96 (08/28 0700)  Labs: Recent Labs    09/30/20 2046 10/01/20 1225 10/01/20 1913 10/02/20 0237 10/02/20 1102  HGB 9.2* 9.5*  --  9.6*  --   HCT 28.9* 29.5*  --  30.1*  --   PLT 147* 168  --  186  --   APTT  --  33  --   --   --   LABPROT  --  14.0  --   --   --   INR  --  1.1  --   --   --   HEPARINUNFRC  --   --  <0.10* <0.10* 0.19*  CREATININE 0.54 0.63  --  0.64  --   TROPONINIHS  --  2  --   --   --      Estimated Creatinine Clearance: 67 mL/min (by C-G formula based on SCr of 0.64 mg/dL).   Medical History: Past Medical History:  Diagnosis Date   Arthritis    Asthma    as a child   Cancer ([place])    Breast ; melonoma - Back and right ear   Complication of anesthesia    Depression    situational   Family history of leukemia    Family history of lung cancer    Family history of skin cancer    Family history of throat cancer    Headache    migraine - decrease number   Hypothyroidism    Personal history of chemotherapy    Started 12/30/18 and currently still having treatments   PONV (postoperative nausea and vomiting)    Violently sick    Medications:  Scheduled:   buPROPion  150 mg Oral Daily   celecoxib  200 mg Oral Daily   doxycycline  50 mg Oral Daily   levothyroxine  75 mcg Oral Q0600   loratadine  10 mg Oral Daily   Infusions:   heparin 1,400 Units/hr (10/02/20 0400)   PRN: acetaminophen, benzonatate, chlorpheniramine-HYDROcodone, fentaNYL  (SUBLIMAZE) injection, guaiFENesin, ondansetron **OR** ondansetron (ZOFRAN) IV, oxyCODONE-acetaminophen  Assessment: 55 yo female with state IV metastatic breast cancer on chemotherapy at [place] presents with +PE on CT.  Pharmacy consulted to dose IV heparin.  Patient is not on anticoagulation PTA.  10/02/2020 HL < 0.19 subtherapeutic on heparin infusion at 1400 unit/hr Hgb 9.6, Plts WNL No bleeding or line interruptions per RN  Goal of Therapy:  Heparin level 0.3-0.7 units/ml Monitor platelets by anticoagulation protocol: Yes   Plan:  Bolus 1800 units Increase drip to 1600 units/hr Heparin level in 6 hours Daily CBC  Ulice Dash D  10/02/2020, 11:46 AM

## 2020-10-02 NOTE — Progress Notes (Signed)
ANTICOAGULATION CONSULT NOTE - follow up  Pharmacy Consult for Heparin Indication: pulmonary embolus  Allergies  Allergen Reactions   Morphine And Related Hives   Hydrocodone-Acetaminophen Nausea Only   Vancomycin Other (See Comments)    Redman's Syndrome    Patient Measurements: Height: '5\' 4"'$  (162.6 cm) Weight: 60.3 kg (133 lb) IBW/kg (Calculated) : 54.7 Heparin Dosing Weight:   Vital Signs: Temp: 98.5 F (36.9 C) (08/28 2048) Temp Source: Oral (08/28 2048) BP: 124/75 (08/28 2048) Pulse Rate: 93 (08/28 2048)  Labs: Recent Labs    09/30/20 2046 10/01/20 1225 10/01/20 1913 10/02/20 0237 10/02/20 1102 10/02/20 2003  HGB 9.2* 9.5*  --  9.6*  --   --   HCT 28.9* 29.5*  --  30.1*  --   --   PLT 147* 168  --  186  --   --   APTT  --  33  --   --   --   --   LABPROT  --  14.0  --   --   --   --   INR  --  1.1  --   --   --   --   HEPARINUNFRC  --   --    < > <0.10* 0.19* 0.74*  CREATININE 0.54 0.63  --  0.64  --   --   TROPONINIHS  --  2  --   --   --   --    < > = values in this interval not displayed.     Estimated Creatinine Clearance: 67 mL/min (by C-G formula based on SCr of 0.64 mg/dL).   Medical History: Past Medical History:  Diagnosis Date   Arthritis    Asthma    as a child   Cancer ([place])    Breast ; melonoma - Back and right ear   Complication of anesthesia    Depression    situational   Family history of leukemia    Family history of lung cancer    Family history of skin cancer    Family history of throat cancer    Headache    migraine - decrease number   Hypothyroidism    Personal history of chemotherapy    Started 12/30/18 and currently still having treatments   PONV (postoperative nausea and vomiting)    Violently sick    Medications:  Scheduled:   buPROPion  150 mg Oral Daily   celecoxib  200 mg Oral Daily   doxycycline  50 mg Oral Daily   levothyroxine  75 mcg Oral Q0600   loratadine  10 mg Oral Daily   Infusions:   heparin  1,600 Units/hr (10/02/20 1255)   PRN: acetaminophen, benzonatate, chlorpheniramine-HYDROcodone, fentaNYL (SUBLIMAZE) injection, guaiFENesin, ondansetron **OR** ondansetron (ZOFRAN) IV, oxyCODONE-acetaminophen  Assessment: 55 yo female with state IV metastatic breast cancer on chemotherapy at [place] presents with +PE on CT.  Pharmacy consulted to dose IV heparin.  Patient is not on anticoagulation PTA.  10/02/2020 HL now supra-therapeutic (0.74) on 1600 units/hr No bleeding or line interruptions per RN  Goal of Therapy:  Heparin level 0.3-0.7 units/ml Monitor platelets by anticoagulation protocol: Yes   Plan:  Decrease heparin to 1500 units/hr Heparin level in 6 hours Daily CBC  Dolly Rias RPh 10/02/2020, 9:59 PM

## 2020-10-02 NOTE — Plan of Care (Signed)

## 2020-10-02 NOTE — Progress Notes (Signed)
ANTICOAGULATION CONSULT NOTE - follow up  Pharmacy Consult for Heparin Indication: pulmonary embolus  Allergies  Allergen Reactions   Morphine And Related Hives   Hydrocodone-Acetaminophen Nausea Only   Vancomycin Other (See Comments)    Redman's Syndrome    Patient Measurements: Height: '5\' 4"'$  (162.6 cm) Weight: 60.3 kg (133 lb) IBW/kg (Calculated) : 54.7 Heparin Dosing Weight:   Vital Signs: Temp: 98.6 F (37 C) (08/27 2301) Temp Source: Oral (08/27 2301) BP: 101/67 (08/27 2301) Pulse Rate: 110 (08/27 2301)  Labs: Recent Labs    09/30/20 2046 10/01/20 1225 10/01/20 1913 10/02/20 0237  HGB 9.2* 9.5*  --  9.6*  HCT 28.9* 29.5*  --  30.1*  PLT 147* 168  --  186  APTT  --  33  --   --   LABPROT  --  14.0  --   --   INR  --  1.1  --   --   HEPARINUNFRC  --   --  <0.10* <0.10*  CREATININE 0.54 0.63  --   --   TROPONINIHS  --  2  --   --      Estimated Creatinine Clearance: 67 mL/min (by C-G formula based on SCr of 0.63 mg/dL).   Medical History: Past Medical History:  Diagnosis Date   Arthritis    Asthma    as a child   Cancer ([place])    Breast ; melonoma - Back and right ear   Complication of anesthesia    Depression    situational   Family history of leukemia    Family history of lung cancer    Family history of skin cancer    Family history of throat cancer    Headache    migraine - decrease number   Hypothyroidism    Personal history of chemotherapy    Started 12/30/18 and currently still having treatments   PONV (postoperative nausea and vomiting)    Violently sick    Medications:  Scheduled:   buPROPion  150 mg Oral Daily   celecoxib  200 mg Oral Daily   doxycycline  50 mg Oral Daily   levothyroxine  75 mcg Oral Q0600   loratadine  10 mg Oral Daily   Infusions:   heparin 1,200 Units/hr (10/01/20 2033)   PRN: acetaminophen, benzonatate, chlorpheniramine-HYDROcodone, guaiFENesin, ondansetron **OR** ondansetron (ZOFRAN) IV,  oxyCODONE-acetaminophen  Assessment: 55 yo female with state IV metastatic breast cancer on chemotherapy at [facility] presents with +PE on CT.  Pharmacy consulted to dose IV heparin.  Patient is not on anticoagulation PTA.  10/02/2020 HL < 0.1 subtherapeutic on 1200 units/hr Hgb 9.3, Plts WNL No bleeding or line interruptions noted   Goal of Therapy:  Heparin level 0.3-0.7 units/ml Monitor platelets by anticoagulation protocol: Yes   Plan:  Bolus 2000 units Increase drip to 1400 units/hr Heparin level in 6 hours Daily CBC  Dolly Rias RPh 10/02/2020, 3:44 AM

## 2020-10-02 NOTE — Progress Notes (Signed)
PROGRESS NOTE    Kara Pittman  [id number] DOB: 1965/08/17 DOA: 10/01/2020 PCP: [name], MD     Brief Narrative:  Kara Pittman is a 55 y.o. female with medical history significant of stage IV metastatic breast cancer currently treated at [facility], hypothyroidism, depression who presented with fevers, chest pain and cough. 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  She is currently just started on a new regimen of chemotherapy at [facility] on 09/13/2020.  She then developed URI symptoms and tested positive for COVID on 09/15/20. She has since completed a 5-day course of antiviral COVID treatment as outpatient.  Patient believes that she is completely recovered from [place]. Patient reports she started having fevers, chills cough and worsening of right-sided chest pain 2 days ago. Patient came to ED yesterday the initial work-up was nonrevealing so she went home.  Patient was called back today for RUL segmental PE on CTPA, negative DVT on RUE and LE doppler.  She was started on heparin drip at ED  New events last 24 hours / Subjective: Having poorly managed pain.  She has significant swelling in the right upper extremity, and pain on the right side of her chest wall due to previous radiation treatment.  Assessment & Plan:   Active Problems:   Hypothyroidism   Malignant neoplasm of lower-inner quadrant of right breast of female, estrogen receptor negative ([place])   Cancer of overlapping sites of right female breast ([place])   Anemia of chronic disease   Pulmonary embolism (HCC)   Hypokalemia   Acute right upper lobe PE -Remains on room air without conversational dyspnea -Right upper extremity and lower extremity Dopplers were negative for DVT -Check echocardiogram -Continue IV heparin another 24 hours -Continue pain management  Metastatic triple negative breast cancer  -Followed by [facility]ology  Hypothyroidism -Continue  Synthroid  Depression/anxiety -Continue Wellbutrin   DVT prophylaxis: IV heparin   Code Status:     Code Status Orders  (From admission, onward)           Start     Ordered   10/01/20 1754  Full code  Continuous        10/01/20 1755           Code Status History     Date Active Date Inactive Code Status Order ID Comments User Context   09/27/2019 1721 10/02/2019 1834 Full Code [id number]  Mercy Riding, MD Inpatient   04/21/2019 1452 04/22/2019 1917 Full Code [id number]  [name], MD Inpatient      Family Communication: No family at bedside Disposition Plan:  Status is: Observation  The patient will require care spanning > 2 midnights and should be moved to inpatient because: Ongoing active pain requiring inpatient pain management and IV treatments appropriate due to intensity of illness or inability to take PO  Dispo: The patient is from: Home              Anticipated d/c is to: Home              Patient currently is not medically stable to d/c.   Difficult to place patient No      Antimicrobials:  Anti-infectives (From admission, onward)    Start     Dose/Rate Route Frequency Ordered Stop   10/02/20 1000  doxycycline (VIBRA-TABS) tablet 50 mg        50 mg Oral Daily 10/01/20 1801     10/01/20  1800  doxycycline (PERIOSTAT) tablet 40 mg  Status:  Discontinued        40 mg Oral Daily 10/01/20 1755 10/01/20 1800        Objective: Vitals:   10/01/20 1857 10/01/20 2301 10/02/20 0628 10/02/20 0700  BP: 115/68 101/67 100/65 98/70  Pulse: (!) 107 (!) 110 93 96  Resp: '19 16 17 16  '$ Temp: 99.2 F (37.3 C) 98.6 F (37 C) 98.5 F (36.9 C) 98.2 F (36.8 C)  TempSrc: Oral Oral Oral Oral  SpO2: 100% 98% 97% 98%  Weight:      Height:        Intake/Output Summary (Last 24 hours) at 10/02/2020 1217 Last data filed at 10/02/2020 0300 Gross per 24 hour  Intake 95.58 ml  Output --  Net 95.58 ml   Filed Weights   10/01/20 1213  Weight: 60.3 kg     Examination:  General exam: Appears calm and comfortable  Respiratory system: Clear to auscultation. Respiratory effort normal. No respiratory distress. No conversational dyspnea.  On room air Cardiovascular system: S1 & S2 heard, RRR. No murmurs. No pedal edema. Gastrointestinal system: Abdomen is nondistended, soft and nontender. Normal bowel sounds heard. Central nervous system: Alert and oriented. No focal neurological deficits. Speech clear.  Extremities: Symmetric in appearance  Skin: Right chest wall covered in dry and clean dressing Psychiatry: Judgement and insight appear normal. Mood & affect appropriate.   Data Reviewed: I have personally reviewed following labs and imaging studies  CBC: Recent Labs  Lab 09/30/20 2046 10/01/20 1225 10/02/20 0237  WBC 8.7 9.0 8.7  NEUTROABS 7.0 7.4  --   HGB 9.2* 9.5* 9.6*  HCT 28.9* 29.5* 30.1*  MCV 100.3* 99.3 100.3*  PLT 147* 168 [id number]   Basic Metabolic Panel: Recent Labs  Lab 09/30/20 2046 10/01/20 1225 10/02/20 0237  NA 136 135 137  K 3.3* 3.0* 3.7  CL 104 100 103  CO2 '27 26 26  '$ GLUCOSE 106* 106* 111*  BUN 21* 13 10  CREATININE 0.54 0.63 0.64  CALCIUM 8.1* 8.0* 8.0*  MG  --   --  2.4   GFR: Estimated Creatinine Clearance: 67 mL/min (by C-G formula based on SCr of 0.64 mg/dL). Liver Function Tests: Recent Labs  Lab 09/30/20 2046 10/01/20 1225 10/02/20 0237  AST 34 24 22  ALT 42 37 38  ALKPHOS 111 100 92  BILITOT 0.1* 0.5 0.3  PROT 6.6 7.1 6.7  ALBUMIN 3.1* 3.3* 3.0*   No results for input(s): LIPASE, AMYLASE in the last 168 hours. No results for input(s): AMMONIA in the last 168 hours. Coagulation Profile: Recent Labs  Lab 10/01/20 1225  INR 1.1   Cardiac Enzymes: No results for input(s): CKTOTAL, CKMB, CKMBINDEX, TROPONINI in the last 168 hours. BNP (last 3 results) No results for input(s): PROBNP in the last 8760 hours. HbA1C: No results for input(s): HGBA1C in the last 72 hours. CBG: No  results for input(s): GLUCAP in the last 168 hours. Lipid Profile: No results for input(s): CHOL, HDL, LDLCALC, TRIG, CHOLHDL, LDLDIRECT in the last 72 hours. Thyroid Function Tests: No results for input(s): TSH, T4TOTAL, FREET4, T3FREE, THYROIDAB in the last 72 hours. Anemia Panel: No results for input(s): VITAMINB12, FOLATE, FERRITIN, TIBC, IRON, RETICCTPCT in the last 72 hours. Sepsis Labs: Recent Labs  Lab 09/30/20 2046  LATICACIDVEN 0.7    Recent Results (from the past 240 hour(s))  Urine Culture     Status: None   Collection Time: 09/30/20  8:46 PM   Specimen: Urine, Clean Catch  Result Value Ref Range Status   Specimen Description   Final    URINE, CLEAN CATCH Performed at [facility], [place] [address] [place], [place] [address]    Special Requests   Final    NONE Performed at [facility], [place] [address], [place], [place] [address]    Culture   Final    NO GROWTH Performed at [facility], [place] [address] [place], [place] [address]    Report Status 10/02/2020 FINAL  Final  SARS CORONAVIRUS 2 (TAT 6-24 HRS) Nasopharyngeal Nasopharyngeal Swab     Status: None   Collection Time: 10/01/20  6:33 PM   Specimen: Nasopharyngeal Swab  Result Value Ref Range Status   SARS Coronavirus 2 NEGATIVE NEGATIVE Final    Comment: (NOTE) SARS-CoV-2 target nucleic acids are NOT DETECTED.  The SARS-CoV-2 RNA is generally detectable in upper and lower respiratory specimens during the acute phase of infection. Negative results do not preclude SARS-CoV-2 infection, do not rule out co-infections with other pathogens, and should not be used as the sole basis for treatment or other patient management decisions. Negative results must be combined with clinical observations, patient history, and epidemiological information. The expected result is Negative.  Fact Sheet for Patients: SugarRoll.be  Fact Sheet for  Healthcare Providers: https://www.woods-mathews.com/  This test is not yet approved or cleared by the [place] FDA and  has been authorized for detection and/or diagnosis of SARS-CoV-2 by FDA under an Emergency Use Authorization (EUA). This EUA will remain  in effect (meaning this test can be used) for the duration of the COVID-19 declaration under Se ction 564(b)(1) of the Act, 21 U.S.C. section 360bbb-3(b)(1), unless the authorization is terminated or revoked sooner.  Performed at [facility], [place] [address] [place], [place] [address]       Radiology Studies: DG Chest 2 View  Result Date: 09/30/2020 CLINICAL DATA:  Right-sided chest pain for 2 days, history of breast and lung cancer, fever EXAM: CHEST - 2 VIEW COMPARISON:  09/16/2020, 05/13/2019, 08/20/2019 FINDINGS: Frontal and lateral views of the chest demonstrates stable left chest wall port. Multiple bilateral pulmonary masses are unchanged. Large masses are seen within the right upper lobe, right middle lobe, and left upper lobe compatible with history of metastatic disease. No acute airspace disease, effusion, or pneumothorax. There are no acute bony abnormalities. IMPRESSION: 1. Stable bilateral lung masses consistent with metastatic disease. There has been no change since 09/16/2020, but these represent progressive finding since 2021. 2. No acute airspace disease. Electronically Signed   By: Randa Ngo M.D.   On: 09/30/2020 19:21   CT Angio Chest PE W/Cm &/Or Wo Cm  Addendum Date: 10/01/2020   ADDENDUM REPORT: 10/01/2020 09:30 ADDENDUM: Upon re-review of CTA chest performed 09/30/2020, the following findings are noted: There is a filling defect within a segmental branch of the right upper lobe pulmonary artery compatible with acute pulmonary embolus, image 63/6. Critical Value/emergent results were called by telephone at the time of interpretation on 10/01/2020 at 9:29 am to provider Leodis Sias MD ,  who verbally acknowledged these results. Electronically Signed   By: Kerby Moors M.D.   On: 10/01/2020 09:30   Result Date: 10/01/2020 CLINICAL DATA:  Fever, cough, stage IV metastatic breast cancer, recent COVID-19 diagnosis 2 weeks ago EXAM: CT ANGIOGRAPHY CHEST WITH CONTRAST TECHNIQUE: Multidetector CT imaging of the chest was performed using the standard  protocol during bolus administration of intravenous contrast. Multiplanar CT image reconstructions and MIPs were obtained to evaluate the vascular anatomy. CONTRAST:  77m OMNIPAQUE IOHEXOL 350 MG/ML SOLN COMPARISON:  09/30/2020, 05/13/2019, 06/11/2019 FINDINGS: Cardiovascular: This is a technically adequate evaluation of the pulmonary vasculature. There are no filling defects or pulmonary emboli. The heart is unremarkable without pericardial effusion. No evidence of thoracic aortic aneurysm or dissection. Left chest wall port via internal jugular approach, tip within the atriocaval junction. Mediastinum/Nodes: Thyroid, trachea, and esophagus are unremarkable. No pathologic mediastinal or hilar adenopathy. There is new spiculated left axillary adenopathy measuring up to 1.5 x 1.9 cm reference image 60/4. Lungs/Pleura: Since the prior CT, bilateral pulmonary masses have developed. Lesions are as follows: Right upper lobe, image 37/10, 5.2 x 5.0 cm. Right lower lobe, image 82/10, 3.8 x 3.0 cm. Left lower lobe, image 78/10, 2.3 x 2.1 cm. Peripheral consolidation within the anterior right upper and right middle lobes likely reflects post radiation therapy changes. No effusion or pneumothorax. Upper Abdomen: Stable 1.4 cm hypodensity within the right lobe liver compatible with a cyst. Indeterminate 0.8 cm hypodensity within the periphery of the right lobe liver image 126/4 is new since prior study, metastatic disease is not excluded. There is also been interval development of a 3.7 x 3.0 cm hypodense mass at the pancreatic tail abutting the splenic hilum,  suspicious for metastatic disease. Chronic areas of scarring are seen within the kidneys. Musculoskeletal: Postsurgical changes are seen from right mastectomy. There are no acute displaced fractures. No destructive bony lesions or evidence of bony metastases. Reconstructed images demonstrate no additional findings. Review of the MIP images confirms the above findings. IMPRESSION: 1. Bilateral pulmonary masses compatible with known metastatic breast cancer. 2. Interval development of a spiculated enlarged left axillary lymph node consistent with progression of disease. 3. New lesions within the right lobe liver and pancreatic tail as above, suspicious for metastatic disease. 4. No evidence of pulmonary embolus. Electronically Signed: By: MRanda NgoM.D. On: 09/30/2020 22:48   VAS [place]OWER EXTREMITY VENOUS (DVT) (ONLY [facility] & [facility])  Result Date: 10/01/2020  Lower Venous DVT Study Patient Name:  JJOANY HASLETT Date of Exam:   10/01/2020 Medical Rec #: [id number]     Accession #:    [id number] of Birth: [birth year]     Patient Gender: F Patient Age:   89years [facility]      VAS [place]OWER EXTREMITY VENOUS (DVT) Referring Phys: EBenjamine MolaHAMMOND --------------------------------------------------------------------------------  Indications: Pulmonary embolism.  Risk Factors: Cancer breast. Comparison Study: No prior studies. Performing Technologist: RDarlin CocoRDMS, RVT  Examination Guidelines: A complete evaluation includes B-mode imaging, spectral Doppler, color Doppler, and power Doppler as needed of all accessible portions of each vessel. Bilateral testing is considered an integral part of a complete examination. Limited examinations for reoccurring indications may be performed as noted. The reflux portion of the exam is performed with the patient in reverse Trendelenburg.  +---------+---------------+---------+-----------+----------+--------------+ RIGHT     CompressibilityPhasicitySpontaneityPropertiesThrombus Aging +---------+---------------+---------+-----------+----------+--------------+ CFV      Full           Yes      Yes                                 +---------+---------------+---------+-----------+----------+--------------+ SFJ      Full                                                        +---------+---------------+---------+-----------+----------+--------------+  FV Prox  Full                                                        +---------+---------------+---------+-----------+----------+--------------+ FV Mid   Full                                                        +---------+---------------+---------+-----------+----------+--------------+ FV DistalFull                                                        +---------+---------------+---------+-----------+----------+--------------+ PFV      Full                                                        +---------+---------------+---------+-----------+----------+--------------+ POP      Full           Yes      Yes                                 +---------+---------------+---------+-----------+----------+--------------+ PTV      Full                                                        +---------+---------------+---------+-----------+----------+--------------+ PERO     Full                                                        +---------+---------------+---------+-----------+----------+--------------+   +---------+---------------+---------+-----------+----------+--------------+ LEFT     CompressibilityPhasicitySpontaneityPropertiesThrombus Aging +---------+---------------+---------+-----------+----------+--------------+ CFV      Full           Yes      Yes                                 +---------+---------------+---------+-----------+----------+--------------+ SFJ      Full                                                         +---------+---------------+---------+-----------+----------+--------------+ FV Prox  Full                                                        +---------+---------------+---------+-----------+----------+--------------+  FV Mid   Full                                                        +---------+---------------+---------+-----------+----------+--------------+ FV DistalFull                                                        +---------+---------------+---------+-----------+----------+--------------+ PFV      Full                                                        +---------+---------------+---------+-----------+----------+--------------+ POP      Full           Yes      Yes                                 +---------+---------------+---------+-----------+----------+--------------+ PTV      Full                                                        +---------+---------------+---------+-----------+----------+--------------+ PERO     Full                                                        +---------+---------------+---------+-----------+----------+--------------+     Summary: RIGHT: - There is no evidence of deep vein thrombosis in the lower extremity.  - No cystic structure found in the popliteal fossa.  LEFT: - There is no evidence of deep vein thrombosis in the lower extremity.  - No cystic structure found in the popliteal fossa.  *See table(s) above for measurements and observations.    Preliminary    UE VENOUS DUPLEX [facility] 7 am - 7 pm)  Result Date: 10/01/2020 UPPER VENOUS STUDY  Patient Name:  LONEY BALRAM  Date of Exam:   10/01/2020 Medical Rec #: [id number]      Accession #:    [id number] Date of Birth: March 12, 1965      Patient Gender: F Patient Age:   47 years Exam Location:  [facility] Procedure:      VAS [place] UPPER EXTREMITY VENOUS DUPLEX Referring Phys: Benjamine Mola HAMMOND  --------------------------------------------------------------------------------  Indications: PE, history of right breast cancer, multiple reconstructive surgeries to right arm Anticoagulation: Heparin. Limitations: Bandages. Comparison Study: No prior studies. Performing Technologist: [place]lin Coco RDMS, RVT  Examination Guidelines: A complete evaluation includes B-mode imaging, spectral Doppler, color Doppler, and power Doppler as needed of all accessible portions of each vessel. Bilateral testing is considered an integral part of a complete examination. Limited examinations for reoccurring indications may be performed as noted.  Right Findings: +----------+------------+---------+-----------+----------+---------------------+  RIGHT     CompressiblePhasicitySpontaneousProperties       Summary        +----------+------------+---------+-----------+----------+---------------------+ IJV           Full       Yes       Yes                                    +----------+------------+---------+-----------+----------+---------------------+ Subclavian               Yes       Yes              Limited visualization                                                       due to bandaging    +----------+------------+---------+-----------+----------+---------------------+ Axillary      Full       Yes       Yes                                    +----------+------------+---------+-----------+----------+---------------------+ Brachial      Full                                                        +----------+------------+---------+-----------+----------+---------------------+ Radial        Full                                                        +----------+------------+---------+-----------+----------+---------------------+ Ulnar         Full                                                         +----------+------------+---------+-----------+----------+---------------------+ Cephalic      Full                                                        +----------+------------+---------+-----------+----------+---------------------+ Basilic       Full                                                        +----------+------------+---------+-----------+----------+---------------------+  Left Findings: +----------+------------+---------+-----------+----------+-------+ LEFT      CompressiblePhasicitySpontaneousPropertiesSummary +----------+------------+---------+-----------+----------+-------+ Subclavian               Yes       Yes                      +----------+------------+---------+-----------+----------+-------+  Summary:  Right: No evidence of deep vein thrombosis in the upper extremity. No evidence of superficial vein thrombosis in the upper extremity.  Left: No evidence of thrombosis in the subclavian.  *See table(s) above for measurements and observations.     Preliminary       Scheduled Meds:  buPROPion  150 mg Oral Daily   celecoxib  200 mg Oral Daily   doxycycline  50 mg Oral Daily   levothyroxine  75 mcg Oral Q0600   loratadine  10 mg Oral Daily   Continuous Infusions:  heparin 1,400 Units/hr (10/02/20 0400)     LOS: 0 days      Time spent: 40 minutes   Dessa Phi, DO [facility] 10/02/2020, 12:17 PM   Available via Epic secure chat 7am-7pm After these hours, please refer to coverage provider listed on [email or web address]

## 2020-10-03 ENCOUNTER — Encounter: Payer: Self-pay | Admitting: Hematology and Oncology

## 2020-10-03 ENCOUNTER — Other Ambulatory Visit (HOSPITAL_COMMUNITY): Payer: Self-pay

## 2020-10-03 DIAGNOSIS — Z515 Encounter for palliative care: Secondary | ICD-10-CM

## 2020-10-03 DIAGNOSIS — C50811 Malignant neoplasm of overlapping sites of right female breast: Secondary | ICD-10-CM

## 2020-10-03 DIAGNOSIS — Z171 Estrogen receptor negative status [ER-]: Secondary | ICD-10-CM | POA: Diagnosis not present

## 2020-10-03 DIAGNOSIS — G893 Neoplasm related pain (acute) (chronic): Secondary | ICD-10-CM

## 2020-10-03 DIAGNOSIS — I2699 Other pulmonary embolism without acute cor pulmonale: Secondary | ICD-10-CM | POA: Diagnosis not present

## 2020-10-03 LAB — BLOOD CULTURE ID PANEL (REFLEXED) - BCID2

## 2020-10-03 LAB — CBC
HCT: 28.8 % — ABNORMAL LOW (ref 36.0–46.0)
Hemoglobin: 8.9 g/dL — ABNORMAL LOW (ref 12.0–15.0)
MCH: 31.3 pg (ref 26.0–34.0)
MCHC: 30.9 g/dL (ref 30.0–36.0)
MCV: 101.4 fL — ABNORMAL HIGH (ref 80.0–100.0)
Platelets: 203 10*3/uL (ref 150–400)
RBC: 2.84 MIL/uL — ABNORMAL LOW (ref 3.87–5.11)
RDW: 13.7 % (ref 11.5–15.5)
WBC: 10.3 10*3/uL (ref 4.0–10.5)
nRBC: 0 % (ref 0.0–0.2)

## 2020-10-03 LAB — HEPARIN LEVEL (UNFRACTIONATED): Heparin Unfractionated: 0.28 IU/mL — ABNORMAL LOW (ref 0.30–0.70)

## 2020-10-03 MED ORDER — CHLORHEXIDINE GLUCONATE CLOTH 2 % EX PADS
6.0000 | MEDICATED_PAD | Freq: Every day | CUTANEOUS | Status: DC
  Administered 2020-10-03: 6 via TOPICAL

## 2020-10-03 MED ORDER — APIXABAN 5 MG PO TABS
ORAL_TABLET | ORAL | 0 refills | Status: DC
  Filled 2020-10-03 (×2): qty 88, 37d supply, fill #0

## 2020-10-03 MED ORDER — APIXABAN 5 MG PO TABS
10.0000 mg | ORAL_TABLET | Freq: Two times a day (BID) | ORAL | Status: DC
  Administered 2020-10-03: 10 mg via ORAL
  Filled 2020-10-03: qty 2

## 2020-10-03 MED ORDER — APIXABAN 5 MG PO TABS: ORAL_TABLET | ORAL | 0 refills | Status: DC

## 2020-10-03 MED ORDER — OXYCODONE HCL 5 MG PO TABS
10.0000 mg | ORAL_TABLET | ORAL | Status: AC
Start: 2020-10-03 — End: 2020-10-03
  Administered 2020-10-03: 10 mg via ORAL
  Filled 2020-10-03: qty 2

## 2020-10-03 MED ORDER — APIXABAN 5 MG PO TABS: 5.0000 mg | ORAL_TABLET | Freq: Two times a day (BID) | ORAL | Status: DC

## 2020-10-03 MED ORDER — OXYCODONE HCL 10 MG PO TABS
10.0000 mg | ORAL_TABLET | ORAL | 0 refills | Status: AC | PRN
  Filled 2020-10-03 (×2): qty 240, 30d supply, fill #0
  Filled 2020-10-04: qty 18, 3d supply, fill #0

## 2020-10-03 MED ORDER — HEPARIN SOD (PORK) LOCK FLUSH 100 UNIT/ML IV SOLN
500.0000 [IU] | Freq: Once | INTRAVENOUS | Status: AC
  Administered 2020-10-03: 500 [IU] via INTRAVENOUS
  Filled 2020-10-03: qty 5

## 2020-10-03 NOTE — Progress Notes (Signed)
PHARMACY - PHYSICIAN COMMUNICATION CRITICAL VALUE ALERT - BLOOD CULTURE IDENTIFICATION (BCID)  Kara Pittman is an 55 y.o. female with stage IV metastatic breast cancer who presented to [facility] on 8/26 for ED visit and was discharged after unrevealing workup (blood cultures were drawn).  She returned on 8/27 with VTE.  Assessment:  1/4 Blood culture bottles growing Staphylococcus epidermidis, MecA+.  MD states no current s/s of infection.    Name of physician (or Provider) Contacted: Dr. Maylene Roes  Current antibiotics: Doxycycline '50mg'$  PO daily.  Changes to prescribed antibiotics recommended:  No change to antibiotics.    Results for orders placed or performed during the hospital encounter of 09/30/20  Blood Culture ID Panel (Reflexed) (Collected: 09/30/2020  8:46 PM)  Result Value Ref Range   Enterococcus faecalis NOT DETECTED NOT DETECTED   Enterococcus Faecium NOT DETECTED NOT DETECTED   Listeria monocytogenes NOT DETECTED NOT DETECTED   Staphylococcus species DETECTED (A) NOT DETECTED   Staphylococcus aureus (BCID) NOT DETECTED NOT DETECTED   Staphylococcus epidermidis DETECTED (A) NOT DETECTED   Staphylococcus lugdunensis NOT DETECTED NOT DETECTED   Streptococcus species NOT DETECTED NOT DETECTED   Streptococcus agalactiae NOT DETECTED NOT DETECTED   Streptococcus pneumoniae NOT DETECTED NOT DETECTED   Streptococcus pyogenes NOT DETECTED NOT DETECTED   A.calcoaceticus-baumannii NOT DETECTED NOT DETECTED   Bacteroides fragilis NOT DETECTED NOT DETECTED   Enterobacterales NOT DETECTED NOT DETECTED   Enterobacter cloacae complex NOT DETECTED NOT DETECTED   Escherichia coli NOT DETECTED NOT DETECTED   Klebsiella aerogenes NOT DETECTED NOT DETECTED   Klebsiella oxytoca NOT DETECTED NOT DETECTED   Klebsiella pneumoniae NOT DETECTED NOT DETECTED   Proteus species NOT DETECTED NOT DETECTED   Salmonella species NOT DETECTED NOT DETECTED   Serratia marcescens NOT DETECTED NOT DETECTED    Haemophilus influenzae NOT DETECTED NOT DETECTED   Neisseria meningitidis NOT DETECTED NOT DETECTED   Pseudomonas aeruginosa NOT DETECTED NOT DETECTED   Stenotrophomonas maltophilia NOT DETECTED NOT DETECTED   Candida albicans NOT DETECTED NOT DETECTED   Candida auris NOT DETECTED NOT DETECTED   Candida glabrata NOT DETECTED NOT DETECTED   Candida krusei NOT DETECTED NOT DETECTED   Candida parapsilosis NOT DETECTED NOT DETECTED   Candida tropicalis NOT DETECTED NOT DETECTED   Cryptococcus neoformans/gattii NOT DETECTED NOT DETECTED   Methicillin resistance mecA/C DETECTED (A) NOT DETECTED    Gretta Arab PharmD, BCPS Clinical Pharmacist [facility] main pharmacy [phone] 10/03/2020 3:36 PM

## 2020-10-03 NOTE — Discharge Summary (Addendum)
Physician Discharge Summary  Kara Pittman [id number] DOB: 1965/11/16 DOA: 10/01/2020  PCP: [name], MD  Admit date: 10/01/2020 Discharge date: 10/03/2020  Admitted From: Home Disposition:  Home  Recommendations for Outpatient Follow-up:  Follow up with Oncology team at [facility]   Discharge Condition: Stable CODE STATUS: Full  Diet recommendation: Regular   Brief/Interim Summary: Kara Pittman is a 55 y.o. female with medical history significant of stage IV metastatic breast cancer currently treated at [facility], hypothyroidism, depression who presented with fevers, chest pain and cough. 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  She is currently just started on a new regimen of chemotherapy at [facility] on 09/13/2020.  She then developed URI symptoms and tested positive for COVID on 09/15/20. She has since completed a 5-day course of antiviral COVID treatment as outpatient.  Patient believes that she is completely recovered from [place]. Patient reports she started having fevers, chills cough and worsening of right-sided chest pain 2 days ago. Patient came to ED yesterday the initial work-up was nonrevealing so she went home.  Patient was called back today for RUL segmental PE on CTPA, negative DVT on RUE and LE doppler.  She was started on heparin drip at ED which was transitioned to Eliquis (patient preferred PO agent over lovenox injections). Patient also had poorly managed pain due to her metastatic cancer. Palliative care was consulted for symptom management, and after discussion, decided to manage on oxycodone q3h prn and defer long-acting medication use as outpatient once she has better grasp of her 24 hour pain medication dosing needs.   Discharge Diagnoses:  Active Problems:   Hypothyroidism   Malignant neoplasm of lower-inner quadrant of right breast of female, estrogen receptor negative ([place])   Cancer of overlapping sites of right female breast  ([place])   Anemia of chronic disease   Pulmonary embolism (HCC)   Hypokalemia  Acute right upper lobe PE -Remains on room air without conversational dyspnea -Right upper extremity and lower extremity Dopplers were negative for DVT -Echo without heart strain  -IV heparin --> Eliquis   Metastatic triple negative breast cancer  -Followed by [facility] oncology -Appreciate palliative care for pain management   Hypothyroidism -Continue Synthroid   Depression/anxiety -Continue Wellbutrin  Discharge Instructions  Discharge Instructions     Call MD for:  difficulty breathing, headache or visual disturbances   Complete by: As directed    Call MD for:  difficulty breathing, headache or visual disturbances   Complete by: As directed    Call MD for:  extreme fatigue   Complete by: As directed    Call MD for:  extreme fatigue   Complete by: As directed    Call MD for:  hives   Complete by: As directed    Call MD for:  hives   Complete by: As directed    Call MD for:  persistant dizziness or light-headedness   Complete by: As directed    Call MD for:  persistant dizziness or light-headedness   Complete by: As directed    Call MD for:  persistant nausea and vomiting   Complete by: As directed    Call MD for:  persistant nausea and vomiting   Complete by: As directed    Call MD for:  severe uncontrolled pain   Complete by: As directed    Call MD for:  severe uncontrolled pain   Complete by: As directed    Call MD for:  temperature >100.4   Complete by: As directed    Call MD for:  temperature >100.4   Complete by: As directed    Diet general   Complete by: As directed    Discharge instructions   Complete by: As directed    You were cared for by a hospitalist during your hospital stay. If you have any questions about your discharge medications or the care you received while you were in the hospital after you are discharged, you can call the unit and ask to speak with the hospitalist on  call if the hospitalist that took care of you is not available. Once you are discharged, your primary care physician will handle any further medical issues. Please note that NO REFILLS for any discharge medications will be authorized once you are discharged, as it is imperative that you return to your primary care physician (or establish a relationship with a primary care physician if you do not have one) for your aftercare needs so that they can reassess your need for medications and monitor your lab values.   Discharge instructions   Complete by: As directed    You were cared for by a hospitalist during your hospital stay. If you have any questions about your discharge medications or the care you received while you were in the hospital after you are discharged, you can call the unit and ask to speak with the hospitalist on call if the hospitalist that took care of you is not available. Once you are discharged, your primary care physician will handle any further medical issues. Please note that NO REFILLS for any discharge medications will be authorized once you are discharged, as it is imperative that you return to your primary care physician (or establish a relationship with a primary care physician if you do not have one) for your aftercare needs so that they can reassess your need for medications and monitor your lab values.   Increase activity slowly   Complete by: As directed    Increase activity slowly   Complete by: As directed       Allergies as of 10/03/2020       Reactions   Morphine And Related Hives   Hydrocodone-acetaminophen Nausea Only   Vancomycin Other (See Comments)   Redman's Syndrome        Medication List     STOP taking these medications    adapalene 0.1 % cream Commonly known as: DIFFERIN   celecoxib 200 MG capsule Commonly known as: CELEBREX   hydrocortisone cream 1 %       TAKE these medications    acetaminophen 500 MG tablet Commonly known as:  TYLENOL Take 500-1,000 mg by mouth every 6 (six) hours as needed for mild pain or headache.   benzonatate 100 MG capsule Commonly known as: TESSALON Take 1 capsule (100 mg total) by mouth 3 (three) times daily as needed for Cough for up to 7 days   buPROPion 150 MG 24 hr tablet Commonly known as: WELLBUTRIN XL TAKE 1 TABLET BY MOUTH ONCE DAILY   [employer] TRIACTING EXPECTORANT PO Take 2 tablets by mouth 2 (two) times daily as needed (cough).   doxycycline 20 MG tablet Commonly known as: PERIOSTAT Take 2 tablets by mouth daily   Eliquis 5 MG Tabs tablet Generic drug: apixaban Take 2 tablets (10 mg total) by mouth twice daily for 7 days, then take 1 tablet twice daily. Start taking on: October 03, 2020   lidocaine-prilocaine cream Commonly known  as: EMLA APPLY A SMALL AMOUNT TO SKIN 1 HOUR PRIOR TO PORT ACCESS AS DIRECTED What changed: Another medication with the same name was removed. Continue taking this medication, and follow the directions you see here.   loratadine 10 MG tablet Commonly known as: CLARITIN Take 1 tablet (10 mg total) by mouth once daily   ondansetron 8 MG tablet Commonly known as: ZOFRAN Take 1 tablet (8 mg total) by mouth every 12 (twelve) hours as needed for nausea or vomiting   Oxycodone HCl 10 MG Tabs Take 1 tablet (10 mg total) by mouth every 3 (three) hours as needed for moderate pain, severe pain or breakthrough pain.   pantoprazole 40 MG tablet Commonly known as: PROTONIX TAKE 1 TABLET BY MOUTH ONCE A DAY What changed:  how much to take when to take this reasons to take this   Synthroid 75 MCG tablet Generic drug: levothyroxine TAKE 1 TABLET (75 MCG TOTAL) BY MOUTH DAILY BEFORE BREAKFAST.   Udenyca 6 MG/0.6ML injection Generic drug: pegfilgrastim-cbqv Inject 6 mg into the skin every 21 ( twenty-one) days.        Follow-up Information     Hensel, Jamal Collin, MD Follow up.   Specialty: Family Medicine Contact information: [place] [address] [phone]         Marletta Lor Follow up.   Specialty: Internal Medicine Why: Follow-up with [facility]               Allergies  Allergen Reactions   Morphine And Related Hives   Hydrocodone-Acetaminophen Nausea Only   Vancomycin Other (See Comments)    Redman's Syndrome    Consultations: Palliative care    Procedures/Studies: DG Chest 2 View  Result Date: 09/30/2020 CLINICAL DATA:  Right-sided chest pain for 2 days, history of breast and lung cancer, fever EXAM: CHEST - 2 VIEW COMPARISON:  09/16/2020, 05/13/2019, 08/20/2019 FINDINGS: Frontal and lateral views of the chest demonstrates stable left chest wall port. Multiple bilateral pulmonary masses are unchanged. Large masses are seen within the right upper lobe, right middle lobe, and left upper lobe compatible with history of metastatic disease. No acute airspace disease, effusion, or pneumothorax. There are no acute bony abnormalities. IMPRESSION: 1. Stable bilateral lung masses consistent with metastatic disease. There has been no change since 09/16/2020, but these represent progressive finding since 2021. 2. No acute airspace disease. Electronically Signed   By: Randa Ngo M.D.   On: 09/30/2020 19:21   CT Angio Chest PE W/Cm &/Or Wo Cm  Addendum Date: 10/01/2020   ADDENDUM REPORT: 10/01/2020 09:30 ADDENDUM: Upon re-review of CTA chest performed 09/30/2020, the following findings are noted: There is a filling defect within a segmental branch of the right upper lobe pulmonary artery compatible with acute pulmonary embolus, image 63/6. Critical Value/emergent results were called by telephone at the time of interpretation on 10/01/2020 at 9:29 am to provider Leodis Sias MD , who verbally acknowledged these results. Electronically Signed   By: Kerby Moors M.D.   On: 10/01/2020 09:30   Result Date: 10/01/2020 CLINICAL DATA:  Fever, cough, stage IV metastatic breast  cancer, recent COVID-19 diagnosis 2 weeks ago EXAM: CT ANGIOGRAPHY CHEST WITH CONTRAST TECHNIQUE: Multidetector CT imaging of the chest was performed using the standard protocol during bolus administration of intravenous contrast. Multiplanar CT image reconstructions and MIPs were obtained to evaluate the vascular anatomy. CONTRAST:  1m OMNIPAQUE IOHEXOL 350 MG/ML SOLN COMPARISON:  09/30/2020, 05/13/2019, 06/11/2019 FINDINGS: Cardiovascular:  This is a technically adequate evaluation of the pulmonary vasculature. There are no filling defects or pulmonary emboli. The heart is unremarkable without pericardial effusion. No evidence of thoracic aortic aneurysm or dissection. Left chest wall port via internal jugular approach, tip within the atriocaval junction. Mediastinum/Nodes: Thyroid, trachea, and esophagus are unremarkable. No pathologic mediastinal or hilar adenopathy. There is new spiculated left axillary adenopathy measuring up to 1.5 x 1.9 cm reference image 60/4. Lungs/Pleura: Since the prior CT, bilateral pulmonary masses have developed. Lesions are as follows: Right upper lobe, image 37/10, 5.2 x 5.0 cm. Right lower lobe, image 82/10, 3.8 x 3.0 cm. Left lower lobe, image 78/10, 2.3 x 2.1 cm. Peripheral consolidation within the anterior right upper and right middle lobes likely reflects post radiation therapy changes. No effusion or pneumothorax. Upper Abdomen: Stable 1.4 cm hypodensity within the right lobe liver compatible with a cyst. Indeterminate 0.8 cm hypodensity within the periphery of the right lobe liver image 126/4 is new since prior study, metastatic disease is not excluded. There is also been interval development of a 3.7 x 3.0 cm hypodense mass at the pancreatic tail abutting the splenic hilum, suspicious for metastatic disease. Chronic areas of scarring are seen within the kidneys. Musculoskeletal: Postsurgical changes are seen from right mastectomy. There are no acute displaced fractures. No  destructive bony lesions or evidence of bony metastases. Reconstructed images demonstrate no additional findings. Review of the MIP images confirms the above findings. IMPRESSION: 1. Bilateral pulmonary masses compatible with known metastatic breast cancer. 2. Interval development of a spiculated enlarged left axillary lymph node consistent with progression of disease. 3. New lesions within the right lobe liver and pancreatic tail as above, suspicious for metastatic disease. 4. No evidence of pulmonary embolus. Electronically Signed: By: Randa Ngo M.D. On: 09/30/2020 22:48   DG Chest Port 1 View  Result Date: 09/16/2020 CLINICAL DATA:  Allergic reaction.  COVID-19 positive EXAM: PORTABLE CHEST 1 VIEW COMPARISON:  08/20/2019 FINDINGS: Left-sided chest port remains in place. Heart size within normal limits. There is an approximately 4.0 cm rounded opacity at the medial aspect of the right lung apex, new from prior. Rounded density within the left mid lung partially obscured by port hub. Nodular prominence of the left perihilar region. There is a rounded radiodensity overlying the peripheral aspect of the right hemithorax with small central radiodensity, which is favored to be external to the patient. Diffusely coarsened interstitial markings bilaterally. No pleural effusion or pneumothorax. No acute bony findings. IMPRESSION: New 4.0 cm rounded opacity at the medial aspect of the right lung apex. Additional nodular densities within the left mid lung. Findings are suspicious for metastatic disease given history of cancer. Further evaluation with contrast-enhanced CT of the chest is recommended. Electronically Signed   By: Davina Poke D.O.   On: 09/16/2020 18:34   ECHOCARDIOGRAM COMPLETE  Result Date: 10/02/2020    ECHOCARDIOGRAM REPORT   Patient Name:   Kara Pittman Date of Exam: 10/02/2020 Medical Rec #:  [id number]     Height:       64.0 in Accession #:    [id number]    Weight:       133.0 lb Date of  Birth:  [birth year]     BSA:          1.645 m Patient Age:    44 years      BP:           100/65 mmHg Patient Gender: F  HR:           84 bpm. Exam Location:  Inpatient Procedure: 2D Echo, 3D Echo, Cardiac Doppler, Color Doppler and Strain Analysis Indications:    I26.02 Pulmonary embolus  History:        Patient has prior history of Echocardiogram examinations, most                 recent 12/22/2018. Signs/Symptoms:Fever and Chest Pain. Breast                 and metastatic cancer. Pulmonary embolus. Recent covid                 infection. Chemotherapy.  Sonographer:    Roseanna Rainbow RDCS Referring Phys: [id number] [facility]  Sonographer Comments: Suboptimal parasternal window. Global longitudinal strain was attempted. Patient has port placed in parasternal region. Patient has wound dressings in SUBC region. IMPRESSIONS  1. Left ventricular ejection fraction, by estimation, is 60 to 65%. Left ventricular ejection fraction by 3D volume is 64 %. The left ventricle has normal function. The left ventricle has no regional wall motion abnormalities. There is mild concentric left ventricular hypertrophy. Left ventricular diastolic parameters were normal. The average left ventricular global longitudinal strain is -25.0 %. The global longitudinal strain is normal.  2. Right ventricular systolic function is normal. The right ventricular size is normal. Tricuspid regurgitation signal is inadequate for assessing PA pressure.  3. The mitral valve is grossly normal. Trivial mitral valve regurgitation. No evidence of mitral stenosis.  4. The aortic valve was not well visualized. Aortic valve regurgitation is not visualized. No aortic stenosis is present.  5. The inferior vena cava is normal in size with greater than 50% respiratory variability, suggesting right atrial pressure of 3 mmHg. Comparison(s): No significant change from prior study. Conclusion(s)/Recommendation(s): Normal biventricular function without evidence of  hemodynamically significant valvular heart disease. FINDINGS  Left Ventricle: Left ventricular ejection fraction, by estimation, is 60 to 65%. Left ventricular ejection fraction by 3D volume is 64 %. The left ventricle has normal function. The left ventricle has no regional wall motion abnormalities. The average left ventricular global longitudinal strain is -25.0 %. The global longitudinal strain is normal. The left ventricular internal cavity size was normal in size. There is mild concentric left ventricular hypertrophy. Left ventricular diastolic parameters were normal. Right Ventricle: The right ventricular size is normal. No increase in right ventricular wall thickness. Right ventricular systolic function is normal. Tricuspid regurgitation signal is inadequate for assessing PA pressure. Left Atrium: Left atrial size was normal in size. Right Atrium: Right atrial size was normal in size. Pericardium: Trivial pericardial effusion is present. Mitral Valve: The mitral valve is grossly normal. Trivial mitral valve regurgitation. No evidence of mitral valve stenosis. Tricuspid Valve: The tricuspid valve is grossly normal. Tricuspid valve regurgitation is trivial. No evidence of tricuspid stenosis. Aortic Valve: The aortic valve was not well visualized. Aortic valve regurgitation is not visualized. No aortic stenosis is present. Pulmonic Valve: The pulmonic valve was not well visualized. Pulmonic valve regurgitation is not visualized. Aorta: The aortic root, ascending aorta, aortic arch and descending aorta are all structurally normal, with no evidence of dilitation or obstruction. Venous: The inferior vena cava is normal in size with greater than 50% respiratory variability, suggesting right atrial pressure of 3 mmHg. IAS/Shunts: The atrial septum is grossly normal.  LEFT VENTRICLE PLAX 2D LVIDd:         4.20 cm  Diastology LVIDs:         2.90 cm         LV e' medial:    10.70 cm/s LV PW:         1.20 cm          LV E/e' medial:  8.0 LV IVS:        1.20 cm         LV e' lateral:   13.90 cm/s LVOT diam:     1.50 cm         LV E/e' lateral: 6.1 LV SV:         34 LV SV Index:   21              2D LVOT Area:     1.77 cm        Longitudinal                                Strain                                2D Strain GLS  -26.6 % LV Volumes (MOD)               (A2C): LV vol d, MOD    66.3 ml       2D Strain GLS  -22.8 % A2C:                           (A3C): LV vol d, MOD    54.1 ml       2D Strain GLS  -25.7 % A4C:                           (A4C): LV vol s, MOD    19.4 ml       2D Strain GLS  -25.0 % A2C:                           Avg: LV vol s, MOD    21.9 ml A4C:                           3D Volume EF LV SV MOD A2C:   46.9 ml       LV 3D EF:    Left LV SV MOD A4C:   54.1 ml                    ventricul LV SV MOD BP:    40.7 ml                    ar                                             ejection                                             fraction  by 3D                                             volume is                                             64 %.                                 3D Volume EF:                                3D EF:        64 %                                LV EDV:       138 ml                                LV ESV:       49 ml                                LV SV:        88 ml RIGHT VENTRICLE             IVC RV S prime:     10.20 cm/s  IVC diam: 0.90 cm TAPSE (M-mode): 2.1 cm LEFT ATRIUM             Index       RIGHT ATRIUM          Index LA diam:        1.80 cm 1.09 cm/m  RA Area:     7.86 cm LA Vol (A2C):   29.2 ml 17.75 ml/m RA Volume:   15.50 ml 9.42 ml/m LA Vol (A4C):   22.6 ml 13.74 ml/m LA Biplane Vol: 26.8 ml 16.29 ml/m  AORTIC VALVE LVOT Vmax:   109.00 cm/s LVOT Vmean:  72.700 cm/s LVOT VTI:    0.191 m  AORTA Ao Root diam: 3.10 cm Ao Asc diam:  3.20 cm MITRAL VALVE MV Area (PHT): 3.99 cm    SHUNTS MV Decel Time: 190 msec    Systemic  VTI:  0.19 m MV E velocity: 85.30 cm/s  Systemic Diam: 1.50 cm MV A velocity: 75.00 cm/s MV E/A ratio:  1.14 Buford Dresser MD Electronically signed by Buford Dresser MD Signature Date/Time: 10/02/2020/2:00:19 PM    Final    VAS [place] LOWER EXTREMITY VENOUS (DVT) (ONLY [facility] & [facility])  Result Date: 10/02/2020  Lower Venous DVT Study Patient Name:  Kara Pittman  Date of Exam:   10/01/2020 Medical Rec #: [id number]      Accession #:    [id number] Date of Birth: March 04, 1965      Patient Gender: F Patient Age:   36 years Exam Location:  [facility] Procedure:      VAS [place] LOWER EXTREMITY VENOUS (DVT) Referring Phys:  ELIZABETH HAMMOND --------------------------------------------------------------------------------  Indications: Pulmonary embolism.  Risk Factors: Cancer breast. Comparison Study: No prior studies. Performing Technologist: Darlin Coco RDMS, RVT  Examination Guidelines: A complete evaluation includes B-mode imaging, spectral Doppler, color Doppler, and power Doppler as needed of all accessible portions of each vessel. Bilateral testing is considered an integral part of a complete examination. Limited examinations for reoccurring indications may be performed as noted. The reflux portion of the exam is performed with the patient in reverse Trendelenburg.  +---------+---------------+---------+-----------+----------+--------------+ RIGHT    CompressibilityPhasicitySpontaneityPropertiesThrombus Aging +---------+---------------+---------+-----------+----------+--------------+ CFV      Full           Yes      Yes                                 +---------+---------------+---------+-----------+----------+--------------+ SFJ      Full                                                        +---------+---------------+---------+-----------+----------+--------------+ FV Prox  Full                                                         +---------+---------------+---------+-----------+----------+--------------+ FV Mid   Full                                                        +---------+---------------+---------+-----------+----------+--------------+ FV DistalFull                                                        +---------+---------------+---------+-----------+----------+--------------+ PFV      Full                                                        +---------+---------------+---------+-----------+----------+--------------+ POP      Full           Yes      Yes                                 +---------+---------------+---------+-----------+----------+--------------+ PTV      Full                                                        +---------+---------------+---------+-----------+----------+--------------+ PERO     Full                                                        +---------+---------------+---------+-----------+----------+--------------+   +---------+---------------+---------+-----------+----------+--------------+  LEFT     CompressibilityPhasicitySpontaneityPropertiesThrombus Aging +---------+---------------+---------+-----------+----------+--------------+ CFV      Full           Yes      Yes                                 +---------+---------------+---------+-----------+----------+--------------+ SFJ      Full                                                        +---------+---------------+---------+-----------+----------+--------------+ FV Prox  Full                                                        +---------+---------------+---------+-----------+----------+--------------+ FV Mid   Full                                                        +---------+---------------+---------+-----------+----------+--------------+ FV DistalFull                                                         +---------+---------------+---------+-----------+----------+--------------+ PFV      Full                                                        +---------+---------------+---------+-----------+----------+--------------+ POP      Full           Yes      Yes                                 +---------+---------------+---------+-----------+----------+--------------+ PTV      Full                                                        +---------+---------------+---------+-----------+----------+--------------+ PERO     Full                                                        +---------+---------------+---------+-----------+----------+--------------+     Summary: RIGHT: - There is no evidence of deep vein thrombosis in the lower extremity.  - No cystic structure found in the popliteal fossa.  LEFT: - There is no evidence of deep vein thrombosis in the lower extremity.  - No  cystic structure found in the popliteal fossa.  *See table(s) above for measurements and observations. Electronically signed by Monica Martinez MD on 10/02/2020 at 2:22:19 PM.    Final    UE VENOUS DUPLEX [facility] 7 am - 7 pm)  Result Date: 10/02/2020 UPPER VENOUS STUDY  Patient Name:  Kara Pittman  Date of Exam:   10/01/2020 Medical Rec #: [id number]      Accession #:    [id number] Date of Birth: [birth year]      Patient Gender: F Patient Age:   86 years Exam Location:  [facility] Procedure:      VAS [place] UPPER EXTREMITY VENOUS DUPLEX Referring Phys: Benjamine Mola HAMMOND --------------------------------------------------------------------------------  Indications: PE, history of right breast cancer, multiple reconstructive surgeries to right arm Anticoagulation: Heparin. Limitations: Bandages. Comparison Study: No prior studies. Performing Technologist: Darlin Coco RDMS, RVT  Examination Guidelines: A complete evaluation includes B-mode imaging, spectral Doppler, color Doppler, and power Doppler as needed of all  accessible portions of each vessel. Bilateral testing is considered an integral part of a complete examination. Limited examinations for reoccurring indications may be performed as noted.  Right Findings: +----------+------------+---------+-----------+----------+---------------------+ RIGHT     CompressiblePhasicitySpontaneousProperties       Summary        +----------+------------+---------+-----------+----------+---------------------+ IJV           Full       Yes       Yes                                    +----------+------------+---------+-----------+----------+---------------------+ Subclavian               Yes       Yes              Limited visualization                                                       due to bandaging    +----------+------------+---------+-----------+----------+---------------------+ Axillary      Full       Yes       Yes                                    +----------+------------+---------+-----------+----------+---------------------+ Brachial      Full                                                        +----------+------------+---------+-----------+----------+---------------------+ Radial        Full                                                        +----------+------------+---------+-----------+----------+---------------------+ Ulnar         Full                                                        +----------+------------+---------+-----------+----------+---------------------+  Cephalic      Full                                                        +----------+------------+---------+-----------+----------+---------------------+ Basilic       Full                                                        +----------+------------+---------+-----------+----------+---------------------+  Left Findings: +----------+------------+---------+-----------+----------+-------+ LEFT       CompressiblePhasicitySpontaneousPropertiesSummary +----------+------------+---------+-----------+----------+-------+ Subclavian               Yes       Yes                      +----------+------------+---------+-----------+----------+-------+  Summary:  Right: No evidence of deep vein thrombosis in the upper extremity. No evidence of superficial vein thrombosis in the upper extremity.  Left: No evidence of thrombosis in the subclavian.  *See table(s) above for measurements and observations.  Diagnosing physician: Monica Martinez MD Electronically signed by Monica Martinez MD on 10/02/2020 at 2:21:59 PM.    Final        Discharge Exam: Vitals:   10/03/20 0438 10/03/20 1407  BP: 110/60 (!) 146/79  Pulse: 92 95  Resp: 16 18  Temp: 98.2 F (36.8 C) 98.2 F (36.8 C)  SpO2: 96% 100%    General: Pt is alert, awake, not in acute distress Cardiovascular: RRR, S1/S2 +, no edema Respiratory: CTA bilaterally, no wheezing, no rhonchi, no respiratory distress, no conversational dyspnea  Abdominal: Soft, NT, ND, bowel sounds + Extremities: no edema, no cyanosis Psych: Normal mood and affect, stable judgement and insight     The results of significant diagnostics from this hospitalization (including imaging, microbiology, ancillary and laboratory) are listed below for reference.     Microbiology: Recent Results (from the past 240 hour(s))  Urine Culture     Status: None   Collection Time: 09/30/20  8:46 PM   Specimen: Urine, Clean Catch  Result Value Ref Range Status   Specimen Description   Final    URINE, CLEAN CATCH Performed at [facility], [place] [address] [place], [place] [address]    Special Requests   Final    NONE Performed at [facility], [place] [address], [place], [place] [address]    Culture   Final    NO GROWTH Performed at [facility], [place] [address], [place], [place] [address]    Report Status 10/02/2020 FINAL   Final  Culture, blood (routine x 2)     Status: Abnormal   Collection Time: 09/30/20  8:46 PM   Specimen: BLOOD  Result Value Ref Range Status   Specimen Description   Final    BLOOD LEFT PORTA CATH Performed at [place] [address], [place], [place] [address]    Special Requests   Final    BOTTLES DRAWN AEROBIC AND ANAEROBIC Blood Culture adequate volume Performed at [place] [address], [place], [place] [address]    Culture  Setup Time   Final    GRAM POSITIVE COCCI IN  CLUSTERS AEROBIC BOTTLE ONLY CRITICAL RESULT CALLED TO, READ BACK BY AND VERIFIED WITH: PHARMD CHRISTINE S. 1536 [id number] FCP    Culture (A)  Final    STAPHYLOCOCCUS EPIDERMIDIS THE SIGNIFICANCE OF ISOLATING THIS ORGANISM FROM A SINGLE SET OF BLOOD CULTURES WHEN MULTIPLE SETS ARE DRAWN IS UNCERTAIN. PLEASE NOTIFY THE MICROBIOLOGY DEPARTMENT WITHIN ONE WEEK IF SPECIATION AND SENSITIVITIES ARE REQUIRED. Performed at [facility], [place] [address] [place], [place] [address]    Report Status 10/04/2020 FINAL  Final  Culture, blood (routine x 2)     Status: None (Preliminary result)   Collection Time: 09/30/20  8:46 PM   Specimen: BLOOD  Result Value Ref Range Status   Specimen Description   Final    BLOOD LEFT ARM Performed at [place] [address] [place], [place] [address]    Special Requests   Final    BOTTLES DRAWN AEROBIC AND ANAEROBIC Blood Culture adequate volume Performed at [place] [address] [place], [place] [address]    Culture   Final    NO GROWTH 2 DAYS Performed at [place] [address] [place], [place] [address]    Report Status PENDING  Incomplete  Blood Culture ID Panel (Reflexed)     Status: Abnormal   Collection Time: 09/30/20  8:46 PM  Result Value Ref Range Status   Enterococcus faecalis NOT DETECTED NOT DETECTED Final   Enterococcus Faecium NOT DETECTED NOT  DETECTED Final   Listeria monocytogenes NOT DETECTED NOT DETECTED Final   Staphylococcus species DETECTED (A) NOT DETECTED Final    Comment: CRITICAL RESULT CALLED TO, READ BACK BY AND VERIFIED WITH: PHARMD CHRISTINE S. 1536 [id number] FCP    Staphylococcus aureus (BCID) NOT DETECTED NOT DETECTED Final   Staphylococcus epidermidis DETECTED (A) NOT DETECTED Final    Comment: Methicillin (oxacillin) resistant coagulase negative staphylococcus. Possible blood culture contaminant (unless isolated from more than one blood culture draw or clinical case suggests pathogenicity). No antibiotic treatment is indicated for blood  culture contaminants. CRITICAL RESULT CALLED TO, READ BACK BY AND VERIFIED WITH: PHARMD CHRISTINE S. 1536 [id number] FCP    Staphylococcus lugdunensis NOT DETECTED NOT DETECTED Final   Streptococcus species NOT DETECTED NOT DETECTED Final   Streptococcus agalactiae NOT DETECTED NOT DETECTED Final   Streptococcus pneumoniae NOT DETECTED NOT DETECTED Final   Streptococcus pyogenes NOT DETECTED NOT DETECTED Final   A.calcoaceticus-baumannii NOT DETECTED NOT DETECTED Final   Bacteroides fragilis NOT DETECTED NOT DETECTED Final   Enterobacterales NOT DETECTED NOT DETECTED Final   Enterobacter cloacae complex NOT DETECTED NOT DETECTED Final   Escherichia coli NOT DETECTED NOT DETECTED Final   Klebsiella aerogenes NOT DETECTED NOT DETECTED Final   Klebsiella oxytoca NOT DETECTED NOT DETECTED Final   Klebsiella pneumoniae NOT DETECTED NOT DETECTED Final   Proteus species NOT DETECTED NOT DETECTED Final   Salmonella species NOT DETECTED NOT DETECTED Final   Serratia marcescens NOT DETECTED NOT DETECTED Final   Haemophilus influenzae NOT DETECTED NOT DETECTED Final   Neisseria meningitidis NOT DETECTED NOT DETECTED Final   Pseudomonas aeruginosa NOT DETECTED NOT DETECTED Final   Stenotrophomonas maltophilia NOT DETECTED NOT DETECTED Final   Candida albicans NOT DETECTED NOT DETECTED  Final   Candida auris NOT DETECTED NOT DETECTED Final   Candida glabrata NOT DETECTED NOT DETECTED Final   Candida krusei NOT DETECTED NOT DETECTED Final   Candida parapsilosis NOT DETECTED NOT DETECTED Final   Candida  tropicalis NOT DETECTED NOT DETECTED Final   Cryptococcus neoformans/gattii NOT DETECTED NOT DETECTED Final   Methicillin resistance mecA/C DETECTED (A) NOT DETECTED Final    Comment: CRITICAL RESULT CALLED TO, READ BACK BY AND VERIFIED WITH: [place]. 1536 [id number] FCP Performed at [facility], [place] [address] [place], [place] [address]   SARS CORONAVIRUS 2 (TAT 6-24 HRS) Nasopharyngeal Nasopharyngeal Swab     Status: None   Collection Time: 10/01/20  6:33 PM   Specimen: Nasopharyngeal Swab  Result Value Ref Range Status   SARS Coronavirus 2 NEGATIVE NEGATIVE Final    Comment: (NOTE) SARS-CoV-2 target nucleic acids are NOT DETECTED.  The SARS-CoV-2 RNA is generally detectable in upper and lower respiratory specimens during the acute phase of infection. Negative results do not preclude SARS-CoV-2 infection, do not rule out co-infections with other pathogens, and should not be used as the sole basis for treatment or other patient management decisions. Negative results must be combined with clinical observations, patient history, and epidemiological information. The expected result is Negative.  Fact Sheet for Patients: SugarRoll.be  Fact Sheet for Healthcare Providers: https://www.woods-mathews.com/  This test is not yet approved or cleared by the [place] FDA and  has been authorized for detection and/or diagnosis of SARS-CoV-2 by FDA under an Emergency Use Authorization (EUA). This EUA will remain  in effect (meaning this test can be used) for the duration of the COVID-19 declaration under Se ction 564(b)(1) of the Act, 21 U.S.C. section 360bbb-3(b)(1), unless the authorization is terminated  or revoked sooner.  Performed at [facility], [place] [address], [place], [place] [address]      Labs: BNP (last 3 results) Recent Labs    10/01/20 1226  BNP [id number]   Basic Metabolic Panel: Recent Labs  Lab 09/30/20 2046 10/01/20 1225 10/02/20 0237  NA 136 135 137  K 3.3* 3.0* 3.7  CL 104 100 103  CO2 '27 26 26  '$ GLUCOSE 106* 106* 111*  BUN 21* 13 10  CREATININE 0.54 0.63 0.64  CALCIUM 8.1* 8.0* 8.0*  MG  --   --  2.4   Liver Function Tests: Recent Labs  Lab 09/30/20 2046 10/01/20 1225 10/02/20 0237  AST 34 24 22  ALT 42 37 38  ALKPHOS 111 100 92  BILITOT 0.1* 0.5 0.3  PROT 6.6 7.1 6.7  ALBUMIN 3.1* 3.3* 3.0*   No results for input(s): LIPASE, AMYLASE in the last 168 hours. No results for input(s): AMMONIA in the last 168 hours. CBC: Recent Labs  Lab 09/30/20 2046 10/01/20 1225 10/02/20 0237 10/03/20 0412  WBC 8.7 9.0 8.7 10.3  NEUTROABS 7.0 7.4  --   --   HGB 9.2* 9.5* 9.6* 8.9*  HCT 28.9* 29.5* 30.1* 28.8*  MCV 100.3* 99.3 100.3* 101.4*  PLT 147* 168 186 203   Cardiac Enzymes: No results for input(s): CKTOTAL, CKMB, CKMBINDEX, TROPONINI in the last 168 hours. BNP: Invalid input(s): POCBNP CBG: No results for input(s): GLUCAP in the last 168 hours. D-Dimer No results for input(s): DDIMER in the last 72 hours.  Hgb A1c No results for input(s): HGBA1C in the last 72 hours. Lipid Profile No results for input(s): CHOL, HDL, LDLCALC, TRIG, CHOLHDL, LDLDIRECT in the last 72 hours. Thyroid function studies No results for input(s): TSH, T4TOTAL, T3FREE, THYROIDAB in the last 72 hours.  Invalid input(s): FREET3 Anemia work up No results for input(s): VITAMINB12, FOLATE, FERRITIN, TIBC, IRON, RETICCTPCT in the last 72 hours. Urinalysis  Component Value Date/Time   COLORURINE STRAW (A) 09/30/2020 2046   APPEARANCEUR CLEAR 09/30/2020 2046   LABSPEC 1.040 (H) 09/30/2020 2046   PHURINE 6.0 09/30/2020 2046   GLUCOSEU NEGATIVE 09/30/2020 2046    HGBUR SMALL (A) 09/30/2020 2046   BILIRUBINUR NEGATIVE 09/30/2020 2046   KETONESUR NEGATIVE 09/30/2020 2046   PROTEINUR NEGATIVE 09/30/2020 2046   UROBILINOGEN 0.2 10/21/2008 1118   NITRITE NEGATIVE 09/30/2020 2046   LEUKOCYTESUR SMALL (A) 09/30/2020 2046   Sepsis Labs Invalid input(s): PROCALCITONIN,  WBC,  LACTICIDVEN Microbiology Recent Results (from the past 240 hour(s))  Urine Culture     Status: None   Collection Time: 09/30/20  8:46 PM   Specimen: Urine, Clean Catch  Result Value Ref Range Status   Specimen Description   Final    URINE, CLEAN CATCH Performed at [facility], [place] [address], [place], [place] [address]    Special Requests   Final    NONE Performed at [facility], [place] [address], [place], [place] [address]    Culture   Final    NO GROWTH Performed at [facility], [place] [address] [place], [place] [address]    Report Status 10/02/2020 FINAL  Final  Culture, blood (routine x 2)     Status: Abnormal   Collection Time: 09/30/20  8:46 PM   Specimen: BLOOD  Result Value Ref Range Status   Specimen Description   Final    BLOOD LEFT PORTA CATH Performed at [place] [address] [place], [place] [address]    Special Requests   Final    BOTTLES DRAWN AEROBIC AND ANAEROBIC Blood Culture adequate volume Performed at [place] [address] [place], [place] [address]    Culture  Setup Time   Final    GRAM POSITIVE COCCI IN CLUSTERS AEROBIC BOTTLE ONLY CRITICAL RESULT CALLED TO, READ BACK BY AND VERIFIED WITH: [place]. 1536 [id number] FCP    Culture (A)  Final    STAPHYLOCOCCUS EPIDERMIDIS THE SIGNIFICANCE OF ISOLATING THIS ORGANISM FROM A SINGLE SET OF BLOOD CULTURES WHEN MULTIPLE SETS ARE DRAWN IS UNCERTAIN. PLEASE NOTIFY THE MICROBIOLOGY DEPARTMENT WITHIN ONE WEEK IF SPECIATION AND SENSITIVITIES ARE REQUIRED. Performed at [facility], [place] [address], [place], [place] [address]    Report Status 10/04/2020 FINAL  Final  Culture, blood (routine x 2)     Status: None (Preliminary result)   Collection Time: 09/30/20  8:46 PM   Specimen: BLOOD  Result Value Ref Range Status   Specimen Description   Final    BLOOD LEFT ARM Performed at [place] [address] [place], [place] [address]    Special Requests   Final    BOTTLES DRAWN AEROBIC AND ANAEROBIC Blood Culture adequate volume Performed at [place] [address] [place], [place] [address]    Culture   Final    NO GROWTH 2 DAYS Performed at [place] [address], [place], [place] [address]    Report Status PENDING  Incomplete  Blood Culture ID Panel (Reflexed)     Status: Abnormal   Collection Time: 09/30/20  8:46 PM  Result Value Ref Range Status   Enterococcus faecalis NOT DETECTED NOT DETECTED Final   Enterococcus Faecium NOT DETECTED NOT DETECTED Final   Listeria monocytogenes NOT DETECTED NOT DETECTED Final   Staphylococcus species DETECTED (A) NOT DETECTED Final  Comment: CRITICAL RESULT CALLED TO, READ BACK BY AND VERIFIED WITH: PHARMD CHRISTINE S. 1536 [id number] FCP    Staphylococcus aureus (BCID) NOT DETECTED NOT DETECTED Final   Staphylococcus epidermidis DETECTED (A) NOT DETECTED Final    Comment: Methicillin (oxacillin) resistant coagulase negative staphylococcus. Possible blood culture contaminant (unless isolated from more than one blood culture draw or clinical case suggests pathogenicity). No antibiotic treatment is indicated for blood  culture contaminants. CRITICAL RESULT CALLED TO, READ BACK BY AND VERIFIED WITH: PHARMD CHRISTINE S. 1536 [id number] FCP    Staphylococcus lugdunensis NOT DETECTED NOT DETECTED Final   Streptococcus species NOT DETECTED NOT DETECTED Final   Streptococcus agalactiae NOT DETECTED NOT DETECTED Final   Streptococcus pneumoniae NOT DETECTED NOT DETECTED Final    Streptococcus pyogenes NOT DETECTED NOT DETECTED Final   A.calcoaceticus-baumannii NOT DETECTED NOT DETECTED Final   Bacteroides fragilis NOT DETECTED NOT DETECTED Final   Enterobacterales NOT DETECTED NOT DETECTED Final   Enterobacter cloacae complex NOT DETECTED NOT DETECTED Final   Escherichia coli NOT DETECTED NOT DETECTED Final   Klebsiella aerogenes NOT DETECTED NOT DETECTED Final   Klebsiella oxytoca NOT DETECTED NOT DETECTED Final   Klebsiella pneumoniae NOT DETECTED NOT DETECTED Final   Proteus species NOT DETECTED NOT DETECTED Final   Salmonella species NOT DETECTED NOT DETECTED Final   Serratia marcescens NOT DETECTED NOT DETECTED Final   Haemophilus influenzae NOT DETECTED NOT DETECTED Final   Neisseria meningitidis NOT DETECTED NOT DETECTED Final   Pseudomonas aeruginosa NOT DETECTED NOT DETECTED Final   Stenotrophomonas maltophilia NOT DETECTED NOT DETECTED Final   Candida albicans NOT DETECTED NOT DETECTED Final   Candida auris NOT DETECTED NOT DETECTED Final   Candida glabrata NOT DETECTED NOT DETECTED Final   Candida krusei NOT DETECTED NOT DETECTED Final   Candida parapsilosis NOT DETECTED NOT DETECTED Final   Candida tropicalis NOT DETECTED NOT DETECTED Final   Cryptococcus neoformans/gattii NOT DETECTED NOT DETECTED Final   Methicillin resistance mecA/C DETECTED (A) NOT DETECTED Final    Comment: CRITICAL RESULT CALLED TO, READ BACK BY AND VERIFIED WITH: [place]. 1536 [id number] FCP Performed at [facility], [address], [place], [place] [address]   SARS CORONAVIRUS 2 (TAT 6-24 HRS) Nasopharyngeal Nasopharyngeal Swab     Status: None   Collection Time: 10/01/20  6:33 PM   Specimen: Nasopharyngeal Swab  Result Value Ref Range Status   SARS Coronavirus 2 NEGATIVE NEGATIVE Final    Comment: (NOTE) SARS-CoV-2 target nucleic acids are NOT DETECTED.  The SARS-CoV-2 RNA is generally detectable in upper and lower respiratory specimens during the acute  phase of infection. Negative results do not preclude SARS-CoV-2 infection, do not rule out co-infections with other pathogens, and should not be used as the sole basis for treatment or other patient management decisions. Negative results must be combined with clinical observations, patient history, and epidemiological information. The expected result is Negative.  Fact Sheet for Patients: SugarRoll.be  Fact Sheet for Healthcare Providers: https://www.woods-mathews.com/  This test is not yet approved or cleared by the [place] FDA and  has been authorized for detection and/or diagnosis of SARS-CoV-2 by FDA under an Emergency Use Authorization (EUA). This EUA will remain  in effect (meaning this test can be used) for the duration of the COVID-19 declaration under Se ction 564(b)(1) of the Act, 21 U.S.C. section 360bbb-3(b)(1), unless the authorization is terminated or revoked sooner.  Performed at [facility], [place],  [place] [address]      Patient was seen and examined on the day of discharge and was found to be in stable condition. Time coordinating discharge: 35 minutes including assessment and coordination of care, as well as examination of the patient.   SIGNED:  Dessa Phi, DO [facility] 10/04/2020, 8:04 AM

## 2020-10-03 NOTE — Discharge Instructions (Signed)
Information on my medicine - ELIQUIS (apixaban)  This medication education was reviewed with me or my healthcare representative as part of my discharge preparation.    Why was Eliquis prescribed for you? Eliquis was prescribed to treat blood clots that may have been found in the veins of your legs (deep vein thrombosis) or in your lungs (pulmonary embolism) and to reduce the risk of them occurring again.  What do You need to know about Eliquis ? The starting dose is 10 mg (two 5 mg tablets) taken TWICE daily for the FIRST SEVEN (7) DAYS, then on 10/10/2020  the dose is reduced to ONE 5 mg tablet taken TWICE daily.  Eliquis may be taken with or without food.   Try to take the dose about the same time in the morning and in the evening. If you have difficulty swallowing the tablet whole please discuss with your pharmacist how to take the medication safely.  Take Eliquis exactly as prescribed and DO NOT stop taking Eliquis without talking to the doctor who prescribed the medication.  Stopping may increase your risk of developing a new blood clot.  Refill your prescription before you run out.  After discharge, you should have regular check-up appointments with your healthcare provider that is prescribing your Eliquis.    What do you do if you miss a dose? If a dose of ELIQUIS is not taken at the scheduled time, take it as soon as possible on the same day and twice-daily administration should be resumed. The dose should not be doubled to make up for a missed dose.  Important Safety Information A possible side effect of Eliquis is bleeding. You should call your healthcare provider right away if you experience any of the following: Bleeding from an injury or your nose that does not stop. Unusual colored urine (red or dark brown) or unusual colored stools (red or black). Unusual bruising for unknown reasons. A serious fall or if you hit your head (even if there is no bleeding).  Some  medicines may interact with Eliquis and might increase your risk of bleeding or clotting while on Eliquis. To help avoid this, consult your healthcare provider or pharmacist prior to using any new prescription or non-prescription medications, including herbals, vitamins, non-steroidal anti-inflammatory drugs (NSAIDs) and supplements.  This website has more information on Eliquis (apixaban): http://www.eliquis.com/eliquis/home

## 2020-10-03 NOTE — Progress Notes (Signed)
ANTICOAGULATION CONSULT NOTE - follow up  Pharmacy Consult for Heparin Indication: pulmonary embolus  Allergies  Allergen Reactions   Morphine And Related Hives   Hydrocodone-Acetaminophen Nausea Only   Vancomycin Other (See Comments)    Redman's Syndrome    Patient Measurements: Height: '5\' 4"'$  (162.6 cm) Weight: 60.3 kg (133 lb) IBW/kg (Calculated) : 54.7 Heparin Dosing Weight:   Vital Signs: Temp: 98.2 F (36.8 C) (08/29 0438) Temp Source: Oral (08/29 0438) BP: 110/60 (08/29 0438) Pulse Rate: 92 (08/29 0438)  Labs: Recent Labs    09/30/20 2046 10/01/20 1225 10/01/20 1913 10/02/20 0237 10/02/20 1102 10/02/20 2003 10/03/20 0412  HGB 9.2* 9.5*  --  9.6*  --   --  8.9*  HCT 28.9* 29.5*  --  30.1*  --   --  28.8*  PLT 147* 168  --  186  --   --  203  APTT  --  33  --   --   --   --   --   LABPROT  --  14.0  --   --   --   --   --   INR  --  1.1  --   --   --   --   --   HEPARINUNFRC  --   --    < > <0.10* 0.19* 0.74* 0.28*  CREATININE 0.54 0.63  --  0.64  --   --   --   TROPONINIHS  --  2  --   --   --   --   --    < > = values in this interval not displayed.     Estimated Creatinine Clearance: 67 mL/min (by C-G formula based on SCr of 0.64 mg/dL).   Medical History: Past Medical History:  Diagnosis Date   Arthritis    Asthma    as a child   Cancer ([place])    Breast ; melonoma - Back and right ear   Complication of anesthesia    Depression    situational   Family history of leukemia    Family history of lung cancer    Family history of skin cancer    Family history of throat cancer    Headache    migraine - decrease number   Hypothyroidism    Personal history of chemotherapy    Started 12/30/18 and currently still having treatments   PONV (postoperative nausea and vomiting)    Violently sick    Medications:  Scheduled:   buPROPion  150 mg Oral Daily   celecoxib  200 mg Oral Daily   Chlorhexidine Gluconate Cloth  6 each Topical Daily    doxycycline  50 mg Oral Daily   levothyroxine  75 mcg Oral Q0600   loratadine  10 mg Oral Daily   Infusions:   heparin 1,500 Units/hr (10/03/20 0322)   PRN: acetaminophen, benzonatate, chlorpheniramine-HYDROcodone, fentaNYL (SUBLIMAZE) injection, guaiFENesin, ondansetron **OR** ondansetron (ZOFRAN) IV, oxyCODONE-acetaminophen  Assessment: 55 yo female with state IV metastatic breast cancer on chemotherapy at [place] presents with +PE on CT.  Pharmacy consulted to dose IV heparin.  Patient is not on anticoagulation PTA.  10/03/2020 HL 0.28 sub-therapeutic on 1500 units/hr Hgb 8.9, plts WNL No bleeding or line interruptions per RN  Goal of Therapy:  Heparin level 0.3-0.7 units/ml Monitor platelets by anticoagulation protocol: Yes   Plan:  Increase heparin to 1550 units/hr Heparin level in 6 hours Daily CBC  Dolly Rias RPh 10/03/2020, 5:01 AM

## 2020-10-04 ENCOUNTER — Other Ambulatory Visit (HOSPITAL_COMMUNITY): Payer: Self-pay

## 2020-10-04 ENCOUNTER — Encounter: Payer: Self-pay | Admitting: Hematology and Oncology

## 2020-10-04 ENCOUNTER — Telehealth: Payer: Self-pay | Admitting: *Deleted

## 2020-10-04 LAB — CULTURE, BLOOD (ROUTINE X 2): Special Requests: ADEQUATE

## 2020-10-04 NOTE — Consult Note (Signed)
Palliative Care Consult Note  Reason for referral: Cancer-related pain  Palliative care consult received.  Chart reviewed including personal review of pertinent labs and imaging.    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  She is currently admitted for chest pain, fever and cough.  She is ready for discharge but concern for pain management moving forward.  I reviewed her PDMP.  This revealed that she had previously been on both short and long-acting pain medications during prior treatment for her cancer and radiation.  She was last prescribed Percocet in February of this year.  They are no concerning findings on review of her PDMP.  I met with Kara Pittman.  We discussed her pain and it has many different components.  Predominantly she reports pain in her right arm underneath to her armpit that is continuous and burning in nature.  She reports pain is currently 4 out of 10 but gets as high as 8 out of 10.  She also has surface pains related to radiation changes/burns.  She says the pain worsens with certain movements and coughing.  It is relieved by oral pain medication but it does not last for hours until she is due for her next dose.  We discussed prior pain regimen including previously when she was taking oxycodone 10 mg as needed.  She started off only needing it at night but then progressed to needing it during the day as well.  She tells me that she can tell when she needs to change her treatment regimen for her metastatic cancer as her pain will begin to worsen when she has disease progression or change.  We also discussed her current cough she is having related to recent COVID infection.  She does find both Tussionex and other opioids beneficial in reducing cough.  - Recommend oxycodone 89m every 3 hours as needed.  She may benefit from long acting agent, but she reports in the past that they made her "loopy."  I  asked her to keep close record of how often and when she is taking medication as this will allow her outpatient providers to make addition of long acting medication or other adjustments as indicated. -Discussed with Dr. CMaylene Roeswill plan to DC Celebrex. - Recommended continue tylenol.  Can use as needed, or we discussed that she may benefit from taking scheduled Tylenol as well.  Discussed that I am switching her from Percocet back over to oxycodone in order to avoid other Tylenol component, however, we discussed plan not to exceed more than 3 g of Tylenol in 24 hours. - Discussed bowel regimen.  Recommended senna daily with addition of miralax as needed.  Total time: 60 minutes Greater than 50%  of this time was spent counseling and coordinating care related to the above assessment and plan.  GMicheline Rough MD [facility] [phone]

## 2020-10-04 NOTE — Telephone Encounter (Signed)
Transition Care Management Unsuccessful Follow-up Telephone Call  Date of discharge and from where:  10/03/2020 - [facility]  Attempts:  1st Attempt  Reason for unsuccessful TCM follow-up call:  Unable to leave message

## 2020-10-05 ENCOUNTER — Other Ambulatory Visit (HOSPITAL_COMMUNITY): Payer: Self-pay

## 2020-10-05 ENCOUNTER — Other Ambulatory Visit: Payer: Self-pay

## 2020-10-05 NOTE — Telephone Encounter (Signed)
Transition Care Management Unsuccessful Follow-up Telephone Call  Date of discharge and from where:  10/03/2020 - [facility]  Attempts:  2nd Attempt  Reason for unsuccessful TCM follow-up call:  Unable to leave message

## 2020-10-06 ENCOUNTER — Encounter: Payer: Self-pay | Admitting: Hematology and Oncology

## 2020-10-06 ENCOUNTER — Other Ambulatory Visit (HOSPITAL_COMMUNITY): Payer: Self-pay

## 2020-10-06 DIAGNOSIS — C7951 Secondary malignant neoplasm of bone: Secondary | ICD-10-CM | POA: Diagnosis not present

## 2020-10-06 DIAGNOSIS — Z171 Estrogen receptor negative status [ER-]: Secondary | ICD-10-CM | POA: Diagnosis not present

## 2020-10-06 DIAGNOSIS — C50811 Malignant neoplasm of overlapping sites of right female breast: Secondary | ICD-10-CM | POA: Diagnosis not present

## 2020-10-06 DIAGNOSIS — C50911 Malignant neoplasm of unspecified site of right female breast: Secondary | ICD-10-CM | POA: Diagnosis not present

## 2020-10-06 DIAGNOSIS — G893 Neoplasm related pain (acute) (chronic): Secondary | ICD-10-CM | POA: Diagnosis not present

## 2020-10-06 DIAGNOSIS — Z515 Encounter for palliative care: Secondary | ICD-10-CM | POA: Diagnosis not present

## 2020-10-06 LAB — CULTURE, BLOOD (ROUTINE X 2)
Culture: NO GROWTH
Special Requests: ADEQUATE

## 2020-10-06 MED ORDER — LORATADINE 10 MG PO TABS
10.0000 mg | ORAL_TABLET | Freq: Every day | ORAL | 1 refills | Status: DC
  Filled 2020-10-06: qty 30, 30d supply, fill #0

## 2020-10-06 MED ORDER — LIDOCAINE-PRILOCAINE 2.5-2.5 % EX CREA
TOPICAL_CREAM | CUTANEOUS | 6 refills | Status: DC
  Filled 2020-10-06: qty 30, 30d supply, fill #0

## 2020-10-06 MED ORDER — BENZONATATE 100 MG PO CAPS
100.0000 mg | ORAL_CAPSULE | Freq: Three times a day (TID) | ORAL | 1 refills | Status: DC | PRN
  Filled 2020-10-06: qty 60, 20d supply, fill #0

## 2020-10-06 MED ORDER — OXYCODONE HCL ER 20 MG PO T12A
EXTENDED_RELEASE_TABLET | ORAL | 0 refills | Status: DC
  Filled 2020-10-06 – 2020-10-11 (×2): qty 60, 30d supply, fill #0

## 2020-10-06 MED ORDER — OXYCODONE HCL 10 MG PO TABS
ORAL_TABLET | ORAL | 0 refills | Status: DC
  Filled 2020-10-07: qty 120, 30d supply, fill #0

## 2020-10-06 NOTE — Telephone Encounter (Signed)
Transition Care Management Unsuccessful Follow-up Telephone Call  Date of discharge and from where:  10/03/2020 - [facility]  Attempts:  3rd Attempt  Reason for unsuccessful TCM follow-up call:  Unable to leave message

## 2020-10-07 ENCOUNTER — Other Ambulatory Visit (HOSPITAL_COMMUNITY): Payer: Self-pay

## 2020-10-07 ENCOUNTER — Encounter: Payer: Self-pay | Admitting: Hematology and Oncology

## 2020-10-07 DIAGNOSIS — C50811 Malignant neoplasm of overlapping sites of right female breast: Secondary | ICD-10-CM | POA: Diagnosis not present

## 2020-10-07 DIAGNOSIS — Z171 Estrogen receptor negative status [ER-]: Secondary | ICD-10-CM | POA: Diagnosis not present

## 2020-10-08 ENCOUNTER — Other Ambulatory Visit (HOSPITAL_COMMUNITY): Payer: Self-pay

## 2020-10-08 ENCOUNTER — Encounter: Payer: Self-pay | Admitting: Hematology and Oncology

## 2020-10-11 ENCOUNTER — Other Ambulatory Visit (HOSPITAL_COMMUNITY): Payer: Self-pay

## 2020-10-11 ENCOUNTER — Encounter: Payer: Self-pay | Admitting: Hematology and Oncology

## 2020-10-13 ENCOUNTER — Other Ambulatory Visit (HOSPITAL_COMMUNITY): Payer: Self-pay

## 2020-10-13 MED ORDER — GABAPENTIN 300 MG PO CAPS
ORAL_CAPSULE | ORAL | 0 refills | Status: AC
  Filled 2020-10-13: qty 90, 30d supply, fill #0

## 2020-10-14 ENCOUNTER — Other Ambulatory Visit (HOSPITAL_COMMUNITY): Payer: Self-pay

## 2020-10-14 DIAGNOSIS — S21101D Unspecified open wound of right front wall of thorax without penetration into thoracic cavity, subsequent encounter: Secondary | ICD-10-CM | POA: Diagnosis not present

## 2020-10-14 DIAGNOSIS — Z79899 Other long term (current) drug therapy: Secondary | ICD-10-CM | POA: Diagnosis not present

## 2020-10-14 DIAGNOSIS — F32A Depression, unspecified: Secondary | ICD-10-CM | POA: Diagnosis not present

## 2020-10-14 DIAGNOSIS — Z9011 Acquired absence of right breast and nipple: Secondary | ICD-10-CM | POA: Diagnosis not present

## 2020-10-14 DIAGNOSIS — C787 Secondary malignant neoplasm of liver and intrahepatic bile duct: Secondary | ICD-10-CM | POA: Diagnosis not present

## 2020-10-14 DIAGNOSIS — Z515 Encounter for palliative care: Secondary | ICD-10-CM | POA: Diagnosis not present

## 2020-10-14 DIAGNOSIS — Z5111 Encounter for antineoplastic chemotherapy: Secondary | ICD-10-CM | POA: Diagnosis not present

## 2020-10-14 DIAGNOSIS — R21 Rash and other nonspecific skin eruption: Secondary | ICD-10-CM | POA: Diagnosis not present

## 2020-10-14 DIAGNOSIS — Z923 Personal history of irradiation: Secondary | ICD-10-CM | POA: Diagnosis not present

## 2020-10-14 DIAGNOSIS — F419 Anxiety disorder, unspecified: Secondary | ICD-10-CM | POA: Diagnosis not present

## 2020-10-14 DIAGNOSIS — C7970 Secondary malignant neoplasm of unspecified adrenal gland: Secondary | ICD-10-CM | POA: Diagnosis not present

## 2020-10-14 DIAGNOSIS — C50811 Malignant neoplasm of overlapping sites of right female breast: Secondary | ICD-10-CM | POA: Diagnosis not present

## 2020-10-14 DIAGNOSIS — Z171 Estrogen receptor negative status [ER-]: Secondary | ICD-10-CM | POA: Diagnosis not present

## 2020-10-15 ENCOUNTER — Other Ambulatory Visit (HOSPITAL_COMMUNITY): Payer: Self-pay

## 2020-10-15 ENCOUNTER — Other Ambulatory Visit: Payer: Self-pay | Admitting: Family Medicine

## 2020-10-15 DIAGNOSIS — E039 Hypothyroidism, unspecified: Secondary | ICD-10-CM

## 2020-10-17 ENCOUNTER — Other Ambulatory Visit (HOSPITAL_COMMUNITY): Payer: Self-pay

## 2020-10-17 DIAGNOSIS — C7951 Secondary malignant neoplasm of bone: Secondary | ICD-10-CM | POA: Diagnosis not present

## 2020-10-17 DIAGNOSIS — C50811 Malignant neoplasm of overlapping sites of right female breast: Secondary | ICD-10-CM | POA: Diagnosis not present

## 2020-10-17 MED ORDER — SYNTHROID 75 MCG PO TABS
ORAL_TABLET | ORAL | 3 refills | Status: DC
  Filled 2020-10-17: qty 90, 90d supply, fill #0

## 2020-10-18 ENCOUNTER — Other Ambulatory Visit (HOSPITAL_COMMUNITY): Payer: Self-pay

## 2020-10-19 DIAGNOSIS — L719 Rosacea, unspecified: Secondary | ICD-10-CM | POA: Diagnosis not present

## 2020-10-20 ENCOUNTER — Other Ambulatory Visit (HOSPITAL_COMMUNITY): Payer: Self-pay

## 2020-10-20 ENCOUNTER — Encounter: Payer: Self-pay | Admitting: Hematology and Oncology

## 2020-10-20 DIAGNOSIS — T402X5A Adverse effect of other opioids, initial encounter: Secondary | ICD-10-CM | POA: Diagnosis not present

## 2020-10-20 DIAGNOSIS — G893 Neoplasm related pain (acute) (chronic): Secondary | ICD-10-CM | POA: Diagnosis not present

## 2020-10-20 DIAGNOSIS — Z515 Encounter for palliative care: Secondary | ICD-10-CM | POA: Diagnosis not present

## 2020-10-20 DIAGNOSIS — C7951 Secondary malignant neoplasm of bone: Secondary | ICD-10-CM | POA: Diagnosis not present

## 2020-10-20 DIAGNOSIS — K5903 Drug induced constipation: Secondary | ICD-10-CM | POA: Diagnosis not present

## 2020-10-20 MED ORDER — OXYCODONE HCL ER 40 MG PO T12A
EXTENDED_RELEASE_TABLET | ORAL | 0 refills | Status: DC
  Filled 2020-10-24: qty 60, 30d supply, fill #0

## 2020-10-20 MED ORDER — OXYCODONE HCL 10 MG PO TABS
ORAL_TABLET | ORAL | 0 refills | Status: DC
  Filled 2020-10-20: qty 150, 13d supply, fill #0

## 2020-10-24 ENCOUNTER — Encounter: Payer: Self-pay | Admitting: Hematology and Oncology

## 2020-10-24 ENCOUNTER — Other Ambulatory Visit (HOSPITAL_COMMUNITY): Payer: Self-pay

## 2020-10-24 DIAGNOSIS — C7951 Secondary malignant neoplasm of bone: Secondary | ICD-10-CM | POA: Diagnosis not present

## 2020-10-25 ENCOUNTER — Encounter: Payer: Self-pay | Admitting: Hematology and Oncology

## 2020-10-25 ENCOUNTER — Other Ambulatory Visit (HOSPITAL_COMMUNITY): Payer: Self-pay

## 2020-10-25 DIAGNOSIS — C7951 Secondary malignant neoplasm of bone: Secondary | ICD-10-CM | POA: Diagnosis not present

## 2020-10-25 DIAGNOSIS — C792 Secondary malignant neoplasm of skin: Secondary | ICD-10-CM | POA: Diagnosis not present

## 2020-10-26 ENCOUNTER — Other Ambulatory Visit (HOSPITAL_COMMUNITY): Payer: Self-pay

## 2020-10-26 ENCOUNTER — Encounter: Payer: Self-pay | Admitting: Hematology and Oncology

## 2020-10-26 DIAGNOSIS — C7951 Secondary malignant neoplasm of bone: Secondary | ICD-10-CM | POA: Diagnosis not present

## 2020-10-26 DIAGNOSIS — C792 Secondary malignant neoplasm of skin: Secondary | ICD-10-CM | POA: Diagnosis not present

## 2020-10-27 ENCOUNTER — Other Ambulatory Visit (HOSPITAL_COMMUNITY): Payer: Self-pay

## 2020-10-27 ENCOUNTER — Encounter: Payer: Self-pay | Admitting: Hematology and Oncology

## 2020-10-27 DIAGNOSIS — C7951 Secondary malignant neoplasm of bone: Secondary | ICD-10-CM | POA: Diagnosis not present

## 2020-10-27 DIAGNOSIS — C792 Secondary malignant neoplasm of skin: Secondary | ICD-10-CM | POA: Diagnosis not present

## 2020-10-28 DIAGNOSIS — C792 Secondary malignant neoplasm of skin: Secondary | ICD-10-CM | POA: Diagnosis not present

## 2020-10-28 DIAGNOSIS — C7951 Secondary malignant neoplasm of bone: Secondary | ICD-10-CM | POA: Diagnosis not present

## 2020-10-31 ENCOUNTER — Other Ambulatory Visit (HOSPITAL_COMMUNITY): Payer: Self-pay

## 2020-10-31 ENCOUNTER — Encounter: Payer: Self-pay | Admitting: Hematology and Oncology

## 2020-10-31 DIAGNOSIS — C50911 Malignant neoplasm of unspecified site of right female breast: Secondary | ICD-10-CM | POA: Diagnosis not present

## 2020-10-31 DIAGNOSIS — C7801 Secondary malignant neoplasm of right lung: Secondary | ICD-10-CM | POA: Diagnosis not present

## 2020-10-31 DIAGNOSIS — C7951 Secondary malignant neoplasm of bone: Secondary | ICD-10-CM | POA: Diagnosis not present

## 2020-10-31 DIAGNOSIS — C792 Secondary malignant neoplasm of skin: Secondary | ICD-10-CM | POA: Diagnosis not present

## 2020-10-31 DIAGNOSIS — D649 Anemia, unspecified: Secondary | ICD-10-CM | POA: Diagnosis not present

## 2020-10-31 DIAGNOSIS — R000 Tachycardia, unspecified: Secondary | ICD-10-CM | POA: Diagnosis not present

## 2020-10-31 DIAGNOSIS — R918 Other nonspecific abnormal finding of lung field: Secondary | ICD-10-CM | POA: Diagnosis not present

## 2020-10-31 DIAGNOSIS — R0600 Dyspnea, unspecified: Secondary | ICD-10-CM | POA: Diagnosis not present

## 2020-10-31 DIAGNOSIS — G893 Neoplasm related pain (acute) (chronic): Secondary | ICD-10-CM | POA: Diagnosis not present

## 2020-10-31 DIAGNOSIS — K869 Disease of pancreas, unspecified: Secondary | ICD-10-CM | POA: Diagnosis not present

## 2020-10-31 DIAGNOSIS — D6489 Other specified anemias: Secondary | ICD-10-CM | POA: Diagnosis not present

## 2020-10-31 DIAGNOSIS — C7802 Secondary malignant neoplasm of left lung: Secondary | ICD-10-CM | POA: Diagnosis not present

## 2020-10-31 MED ORDER — PANTOPRAZOLE SODIUM 40 MG PO TBEC
40.0000 mg | DELAYED_RELEASE_TABLET | Freq: Every day | ORAL | 11 refills | Status: DC
  Filled 2020-10-31: qty 30, 30d supply, fill #0

## 2020-10-31 MED ORDER — DEXAMETHASONE 2 MG PO TABS
ORAL_TABLET | ORAL | 2 refills | Status: AC
  Filled 2020-10-31: qty 30, 30d supply, fill #0

## 2020-10-31 MED ORDER — OXYCODONE HCL 15 MG PO TABS
ORAL_TABLET | ORAL | 0 refills | Status: DC
  Filled 2020-10-31: qty 150, 19d supply, fill #0

## 2020-11-01 ENCOUNTER — Other Ambulatory Visit (HOSPITAL_COMMUNITY): Payer: Self-pay

## 2020-11-01 DIAGNOSIS — G893 Neoplasm related pain (acute) (chronic): Secondary | ICD-10-CM | POA: Diagnosis not present

## 2020-11-01 DIAGNOSIS — M79621 Pain in right upper arm: Secondary | ICD-10-CM | POA: Diagnosis not present

## 2020-11-01 DIAGNOSIS — C7801 Secondary malignant neoplasm of right lung: Secondary | ICD-10-CM | POA: Diagnosis not present

## 2020-11-01 DIAGNOSIS — D649 Anemia, unspecified: Secondary | ICD-10-CM | POA: Diagnosis not present

## 2020-11-01 DIAGNOSIS — C50911 Malignant neoplasm of unspecified site of right female breast: Secondary | ICD-10-CM | POA: Diagnosis not present

## 2020-11-01 DIAGNOSIS — C761 Malignant neoplasm of thorax: Secondary | ICD-10-CM | POA: Diagnosis not present

## 2020-11-01 DIAGNOSIS — R0600 Dyspnea, unspecified: Secondary | ICD-10-CM | POA: Diagnosis not present

## 2020-11-01 DIAGNOSIS — C7802 Secondary malignant neoplasm of left lung: Secondary | ICD-10-CM | POA: Diagnosis not present

## 2020-11-01 DIAGNOSIS — C50811 Malignant neoplasm of overlapping sites of right female breast: Secondary | ICD-10-CM | POA: Diagnosis not present

## 2020-11-01 DIAGNOSIS — R918 Other nonspecific abnormal finding of lung field: Secondary | ICD-10-CM | POA: Diagnosis not present

## 2020-11-01 DIAGNOSIS — K869 Disease of pancreas, unspecified: Secondary | ICD-10-CM | POA: Diagnosis not present

## 2020-11-01 DIAGNOSIS — R29898 Other symptoms and signs involving the musculoskeletal system: Secondary | ICD-10-CM | POA: Diagnosis not present

## 2020-11-02 ENCOUNTER — Other Ambulatory Visit (HOSPITAL_COMMUNITY): Payer: Self-pay

## 2020-11-02 DIAGNOSIS — C50911 Malignant neoplasm of unspecified site of right female breast: Secondary | ICD-10-CM | POA: Diagnosis not present

## 2020-11-02 DIAGNOSIS — C792 Secondary malignant neoplasm of skin: Secondary | ICD-10-CM | POA: Diagnosis not present

## 2020-11-02 DIAGNOSIS — R29898 Other symptoms and signs involving the musculoskeletal system: Secondary | ICD-10-CM | POA: Diagnosis not present

## 2020-11-02 DIAGNOSIS — D649 Anemia, unspecified: Secondary | ICD-10-CM | POA: Diagnosis not present

## 2020-11-02 DIAGNOSIS — G893 Neoplasm related pain (acute) (chronic): Secondary | ICD-10-CM | POA: Diagnosis not present

## 2020-11-02 DIAGNOSIS — D696 Thrombocytopenia, unspecified: Secondary | ICD-10-CM | POA: Diagnosis not present

## 2020-11-02 DIAGNOSIS — R509 Fever, unspecified: Secondary | ICD-10-CM | POA: Diagnosis not present

## 2020-11-02 DIAGNOSIS — C7951 Secondary malignant neoplasm of bone: Secondary | ICD-10-CM | POA: Diagnosis not present

## 2020-11-02 DIAGNOSIS — M79621 Pain in right upper arm: Secondary | ICD-10-CM | POA: Diagnosis not present

## 2020-11-02 DIAGNOSIS — C50811 Malignant neoplasm of overlapping sites of right female breast: Secondary | ICD-10-CM | POA: Diagnosis not present

## 2020-11-02 DIAGNOSIS — Z171 Estrogen receptor negative status [ER-]: Secondary | ICD-10-CM | POA: Diagnosis not present

## 2020-11-02 DIAGNOSIS — M898X8 Other specified disorders of bone, other site: Secondary | ICD-10-CM | POA: Diagnosis not present

## 2020-11-03 DIAGNOSIS — G893 Neoplasm related pain (acute) (chronic): Secondary | ICD-10-CM | POA: Diagnosis not present

## 2020-11-03 DIAGNOSIS — C7951 Secondary malignant neoplasm of bone: Secondary | ICD-10-CM | POA: Diagnosis not present

## 2020-11-03 DIAGNOSIS — D649 Anemia, unspecified: Secondary | ICD-10-CM | POA: Diagnosis not present

## 2020-11-03 DIAGNOSIS — M79621 Pain in right upper arm: Secondary | ICD-10-CM | POA: Diagnosis not present

## 2020-11-03 DIAGNOSIS — R29898 Other symptoms and signs involving the musculoskeletal system: Secondary | ICD-10-CM | POA: Diagnosis not present

## 2020-11-03 DIAGNOSIS — C50811 Malignant neoplasm of overlapping sites of right female breast: Secondary | ICD-10-CM | POA: Diagnosis not present

## 2020-11-03 DIAGNOSIS — C792 Secondary malignant neoplasm of skin: Secondary | ICD-10-CM | POA: Diagnosis not present

## 2020-11-03 DIAGNOSIS — D696 Thrombocytopenia, unspecified: Secondary | ICD-10-CM | POA: Diagnosis not present

## 2020-11-03 DIAGNOSIS — C50911 Malignant neoplasm of unspecified site of right female breast: Secondary | ICD-10-CM | POA: Diagnosis not present

## 2020-11-04 DIAGNOSIS — R29898 Other symptoms and signs involving the musculoskeletal system: Secondary | ICD-10-CM | POA: Diagnosis not present

## 2020-11-04 DIAGNOSIS — G893 Neoplasm related pain (acute) (chronic): Secondary | ICD-10-CM | POA: Diagnosis not present

## 2020-11-04 DIAGNOSIS — M79621 Pain in right upper arm: Secondary | ICD-10-CM | POA: Diagnosis not present

## 2020-11-04 DIAGNOSIS — D696 Thrombocytopenia, unspecified: Secondary | ICD-10-CM | POA: Diagnosis not present

## 2020-11-04 DIAGNOSIS — C50811 Malignant neoplasm of overlapping sites of right female breast: Secondary | ICD-10-CM | POA: Diagnosis not present

## 2020-11-04 DIAGNOSIS — C50911 Malignant neoplasm of unspecified site of right female breast: Secondary | ICD-10-CM | POA: Diagnosis not present

## 2020-11-04 DIAGNOSIS — D649 Anemia, unspecified: Secondary | ICD-10-CM | POA: Diagnosis not present

## 2020-11-04 DIAGNOSIS — R000 Tachycardia, unspecified: Secondary | ICD-10-CM | POA: Diagnosis not present

## 2020-11-05 DIAGNOSIS — R0789 Other chest pain: Secondary | ICD-10-CM | POA: Diagnosis not present

## 2020-11-05 DIAGNOSIS — G893 Neoplasm related pain (acute) (chronic): Secondary | ICD-10-CM | POA: Diagnosis not present

## 2020-11-05 DIAGNOSIS — D649 Anemia, unspecified: Secondary | ICD-10-CM | POA: Diagnosis not present

## 2020-11-05 DIAGNOSIS — R110 Nausea: Secondary | ICD-10-CM | POA: Diagnosis not present

## 2020-11-05 DIAGNOSIS — K5903 Drug induced constipation: Secondary | ICD-10-CM | POA: Diagnosis not present

## 2020-11-05 DIAGNOSIS — R0600 Dyspnea, unspecified: Secondary | ICD-10-CM | POA: Diagnosis not present

## 2020-11-05 DIAGNOSIS — C50811 Malignant neoplasm of overlapping sites of right female breast: Secondary | ICD-10-CM | POA: Diagnosis not present

## 2020-11-06 DIAGNOSIS — C50811 Malignant neoplasm of overlapping sites of right female breast: Secondary | ICD-10-CM | POA: Diagnosis not present

## 2020-11-06 DIAGNOSIS — D649 Anemia, unspecified: Secondary | ICD-10-CM | POA: Diagnosis not present

## 2020-11-06 DIAGNOSIS — R0600 Dyspnea, unspecified: Secondary | ICD-10-CM | POA: Diagnosis not present

## 2020-11-06 DIAGNOSIS — K5903 Drug induced constipation: Secondary | ICD-10-CM | POA: Diagnosis not present

## 2020-11-06 DIAGNOSIS — R110 Nausea: Secondary | ICD-10-CM | POA: Diagnosis not present

## 2020-11-06 DIAGNOSIS — R0789 Other chest pain: Secondary | ICD-10-CM | POA: Diagnosis not present

## 2020-11-06 DIAGNOSIS — G893 Neoplasm related pain (acute) (chronic): Secondary | ICD-10-CM | POA: Diagnosis not present

## 2020-11-07 DIAGNOSIS — R0600 Dyspnea, unspecified: Secondary | ICD-10-CM | POA: Diagnosis not present

## 2020-11-07 DIAGNOSIS — G893 Neoplasm related pain (acute) (chronic): Secondary | ICD-10-CM | POA: Diagnosis not present

## 2020-11-07 DIAGNOSIS — R000 Tachycardia, unspecified: Secondary | ICD-10-CM | POA: Diagnosis not present

## 2020-11-07 DIAGNOSIS — D649 Anemia, unspecified: Secondary | ICD-10-CM | POA: Diagnosis not present

## 2020-11-07 DIAGNOSIS — C50811 Malignant neoplasm of overlapping sites of right female breast: Secondary | ICD-10-CM | POA: Diagnosis not present

## 2020-11-08 DIAGNOSIS — G893 Neoplasm related pain (acute) (chronic): Secondary | ICD-10-CM | POA: Diagnosis not present

## 2020-11-08 DIAGNOSIS — D649 Anemia, unspecified: Secondary | ICD-10-CM | POA: Diagnosis not present

## 2020-11-08 DIAGNOSIS — R0600 Dyspnea, unspecified: Secondary | ICD-10-CM | POA: Diagnosis not present

## 2020-11-08 DIAGNOSIS — C50811 Malignant neoplasm of overlapping sites of right female breast: Secondary | ICD-10-CM | POA: Diagnosis not present

## 2020-11-09 DIAGNOSIS — C50811 Malignant neoplasm of overlapping sites of right female breast: Secondary | ICD-10-CM | POA: Diagnosis not present

## 2020-11-09 DIAGNOSIS — G893 Neoplasm related pain (acute) (chronic): Secondary | ICD-10-CM | POA: Diagnosis not present

## 2020-11-09 DIAGNOSIS — R0600 Dyspnea, unspecified: Secondary | ICD-10-CM | POA: Diagnosis not present

## 2020-11-09 DIAGNOSIS — D649 Anemia, unspecified: Secondary | ICD-10-CM | POA: Diagnosis not present

## 2020-11-10 DIAGNOSIS — Z515 Encounter for palliative care: Secondary | ICD-10-CM | POA: Diagnosis not present

## 2020-11-10 DIAGNOSIS — R0600 Dyspnea, unspecified: Secondary | ICD-10-CM | POA: Diagnosis not present

## 2020-11-10 DIAGNOSIS — C50811 Malignant neoplasm of overlapping sites of right female breast: Secondary | ICD-10-CM | POA: Diagnosis not present

## 2020-11-10 DIAGNOSIS — G893 Neoplasm related pain (acute) (chronic): Secondary | ICD-10-CM | POA: Diagnosis not present

## 2020-11-10 DIAGNOSIS — D649 Anemia, unspecified: Secondary | ICD-10-CM | POA: Diagnosis not present

## 2020-11-11 DIAGNOSIS — R112 Nausea with vomiting, unspecified: Secondary | ICD-10-CM | POA: Diagnosis not present

## 2020-11-11 DIAGNOSIS — G893 Neoplasm related pain (acute) (chronic): Secondary | ICD-10-CM | POA: Diagnosis not present

## 2020-11-11 DIAGNOSIS — Z515 Encounter for palliative care: Secondary | ICD-10-CM | POA: Diagnosis not present

## 2020-11-11 DIAGNOSIS — R0600 Dyspnea, unspecified: Secondary | ICD-10-CM | POA: Diagnosis not present

## 2020-11-11 DIAGNOSIS — R000 Tachycardia, unspecified: Secondary | ICD-10-CM | POA: Diagnosis not present

## 2020-11-11 DIAGNOSIS — D649 Anemia, unspecified: Secondary | ICD-10-CM | POA: Diagnosis not present

## 2020-11-11 DIAGNOSIS — C50811 Malignant neoplasm of overlapping sites of right female breast: Secondary | ICD-10-CM | POA: Diagnosis not present

## 2020-11-12 DIAGNOSIS — C50811 Malignant neoplasm of overlapping sites of right female breast: Secondary | ICD-10-CM | POA: Diagnosis not present

## 2020-11-12 DIAGNOSIS — D649 Anemia, unspecified: Secondary | ICD-10-CM | POA: Diagnosis not present

## 2020-11-12 DIAGNOSIS — Z515 Encounter for palliative care: Secondary | ICD-10-CM | POA: Diagnosis not present

## 2020-11-12 DIAGNOSIS — G893 Neoplasm related pain (acute) (chronic): Secondary | ICD-10-CM | POA: Diagnosis not present

## 2020-11-12 DIAGNOSIS — E039 Hypothyroidism, unspecified: Secondary | ICD-10-CM | POA: Diagnosis not present

## 2020-11-13 DIAGNOSIS — D649 Anemia, unspecified: Secondary | ICD-10-CM | POA: Diagnosis not present

## 2020-11-13 DIAGNOSIS — G893 Neoplasm related pain (acute) (chronic): Secondary | ICD-10-CM | POA: Diagnosis not present

## 2020-11-13 DIAGNOSIS — E039 Hypothyroidism, unspecified: Secondary | ICD-10-CM | POA: Diagnosis not present

## 2020-11-13 DIAGNOSIS — C50811 Malignant neoplasm of overlapping sites of right female breast: Secondary | ICD-10-CM | POA: Diagnosis not present

## 2020-11-14 DIAGNOSIS — C50811 Malignant neoplasm of overlapping sites of right female breast: Secondary | ICD-10-CM | POA: Diagnosis not present

## 2020-11-14 DIAGNOSIS — Z515 Encounter for palliative care: Secondary | ICD-10-CM | POA: Diagnosis not present

## 2020-11-14 DIAGNOSIS — E039 Hypothyroidism, unspecified: Secondary | ICD-10-CM | POA: Diagnosis not present

## 2020-11-14 DIAGNOSIS — D649 Anemia, unspecified: Secondary | ICD-10-CM | POA: Diagnosis not present

## 2020-11-14 DIAGNOSIS — G893 Neoplasm related pain (acute) (chronic): Secondary | ICD-10-CM | POA: Diagnosis not present

## 2020-11-14 DIAGNOSIS — R000 Tachycardia, unspecified: Secondary | ICD-10-CM | POA: Diagnosis not present

## 2020-11-15 DIAGNOSIS — E039 Hypothyroidism, unspecified: Secondary | ICD-10-CM | POA: Diagnosis not present

## 2020-11-15 DIAGNOSIS — C50811 Malignant neoplasm of overlapping sites of right female breast: Secondary | ICD-10-CM | POA: Diagnosis not present

## 2020-11-15 DIAGNOSIS — G893 Neoplasm related pain (acute) (chronic): Secondary | ICD-10-CM | POA: Diagnosis not present

## 2020-11-15 DIAGNOSIS — D649 Anemia, unspecified: Secondary | ICD-10-CM | POA: Diagnosis not present

## 2020-11-16 DIAGNOSIS — E559 Vitamin D deficiency, unspecified: Secondary | ICD-10-CM | POA: Diagnosis not present

## 2020-11-16 DIAGNOSIS — Z171 Estrogen receptor negative status [ER-]: Secondary | ICD-10-CM | POA: Diagnosis not present

## 2020-11-16 DIAGNOSIS — E039 Hypothyroidism, unspecified: Secondary | ICD-10-CM | POA: Diagnosis not present

## 2020-11-16 DIAGNOSIS — C50811 Malignant neoplasm of overlapping sites of right female breast: Secondary | ICD-10-CM | POA: Diagnosis not present

## 2020-11-16 DIAGNOSIS — D649 Anemia, unspecified: Secondary | ICD-10-CM | POA: Diagnosis not present

## 2020-11-16 DIAGNOSIS — G893 Neoplasm related pain (acute) (chronic): Secondary | ICD-10-CM | POA: Diagnosis not present

## 2020-11-17 DIAGNOSIS — E039 Hypothyroidism, unspecified: Secondary | ICD-10-CM | POA: Diagnosis not present

## 2020-11-17 DIAGNOSIS — D649 Anemia, unspecified: Secondary | ICD-10-CM | POA: Diagnosis not present

## 2020-11-17 DIAGNOSIS — C50811 Malignant neoplasm of overlapping sites of right female breast: Secondary | ICD-10-CM | POA: Diagnosis not present

## 2020-11-17 DIAGNOSIS — G893 Neoplasm related pain (acute) (chronic): Secondary | ICD-10-CM | POA: Diagnosis not present

## 2020-11-18 ENCOUNTER — Other Ambulatory Visit (HOSPITAL_COMMUNITY): Payer: Self-pay

## 2020-11-18 DIAGNOSIS — C50811 Malignant neoplasm of overlapping sites of right female breast: Secondary | ICD-10-CM | POA: Diagnosis not present

## 2020-11-18 DIAGNOSIS — G893 Neoplasm related pain (acute) (chronic): Secondary | ICD-10-CM | POA: Diagnosis not present

## 2020-11-18 DIAGNOSIS — E039 Hypothyroidism, unspecified: Secondary | ICD-10-CM | POA: Diagnosis not present

## 2020-11-18 DIAGNOSIS — D649 Anemia, unspecified: Secondary | ICD-10-CM | POA: Diagnosis not present

## 2020-11-18 MED ORDER — ONDANSETRON 4 MG PO TBDP
ORAL_TABLET | ORAL | 0 refills | Status: DC
  Filled 2020-11-18: qty 30, 30d supply, fill #0

## 2020-11-18 MED ORDER — LIDOCAINE 5 % EX PTCH
MEDICATED_PATCH | CUTANEOUS | 0 refills | Status: DC
  Filled 2020-11-18: qty 90, 30d supply, fill #0

## 2020-11-18 MED ORDER — LIDOCAINE 4 % EX PTCH: MEDICATED_PATCH | CUTANEOUS | 0 refills | Status: AC

## 2020-11-21 ENCOUNTER — Other Ambulatory Visit (HOSPITAL_COMMUNITY): Payer: Self-pay

## 2020-11-21 ENCOUNTER — Telehealth: Payer: Self-pay

## 2020-11-21 NOTE — Telephone Encounter (Signed)
Transition Care Management Unsuccessful Follow-up Telephone Call  Date of discharge and from where:  11/18/2020-[facility]  Attempts:  1st Attempt  Reason for unsuccessful TCM follow-up call:  Left voice message

## 2020-11-22 ENCOUNTER — Inpatient Hospital Stay (HOSPITAL_COMMUNITY)
Admission: EM | Admit: 2020-11-22 | Discharge: 2020-12-03 | DRG: 871 | Disposition: A | Discharge status: Hospice | Payer: Medicaid Other | Attending: Family Medicine | Admitting: Family Medicine

## 2020-11-22 ENCOUNTER — Encounter (HOSPITAL_COMMUNITY): Payer: Self-pay

## 2020-11-22 ENCOUNTER — Emergency Department (HOSPITAL_COMMUNITY): Payer: Medicaid Other

## 2020-11-22 DIAGNOSIS — J189 Pneumonia, unspecified organism: Secondary | ICD-10-CM | POA: Diagnosis not present

## 2020-11-22 DIAGNOSIS — Z9011 Acquired absence of right breast and nipple: Secondary | ICD-10-CM

## 2020-11-22 DIAGNOSIS — R509 Fever, unspecified: Secondary | ICD-10-CM | POA: Diagnosis not present

## 2020-11-22 DIAGNOSIS — Z171 Estrogen receptor negative status [ER-]: Secondary | ICD-10-CM | POA: Diagnosis not present

## 2020-11-22 DIAGNOSIS — S21101D Unspecified open wound of right front wall of thorax without penetration into thoracic cavity, subsequent encounter: Secondary | ICD-10-CM | POA: Diagnosis not present

## 2020-11-22 DIAGNOSIS — R000 Tachycardia, unspecified: Secondary | ICD-10-CM | POA: Diagnosis not present

## 2020-11-22 DIAGNOSIS — D696 Thrombocytopenia, unspecified: Secondary | ICD-10-CM | POA: Diagnosis present

## 2020-11-22 DIAGNOSIS — C787 Secondary malignant neoplasm of liver and intrahepatic bile duct: Secondary | ICD-10-CM | POA: Diagnosis present

## 2020-11-22 DIAGNOSIS — R471 Dysarthria and anarthria: Secondary | ICD-10-CM | POA: Diagnosis not present

## 2020-11-22 DIAGNOSIS — R0902 Hypoxemia: Secondary | ICD-10-CM | POA: Diagnosis not present

## 2020-11-22 DIAGNOSIS — Z86711 Personal history of pulmonary embolism: Secondary | ICD-10-CM

## 2020-11-22 DIAGNOSIS — G9341 Metabolic encephalopathy: Secondary | ICD-10-CM | POA: Diagnosis not present

## 2020-11-22 DIAGNOSIS — E039 Hypothyroidism, unspecified: Secondary | ICD-10-CM | POA: Diagnosis present

## 2020-11-22 DIAGNOSIS — Y842 Radiological procedure and radiotherapy as the cause of abnormal reaction of the patient, or of later complication, without mention of misadventure at the time of the procedure: Secondary | ICD-10-CM | POA: Diagnosis present

## 2020-11-22 DIAGNOSIS — F32A Depression, unspecified: Secondary | ICD-10-CM | POA: Diagnosis present

## 2020-11-22 DIAGNOSIS — Z885 Allergy status to narcotic agent status: Secondary | ICD-10-CM

## 2020-11-22 DIAGNOSIS — Z79899 Other long term (current) drug therapy: Secondary | ICD-10-CM

## 2020-11-22 DIAGNOSIS — Z20822 Contact with and (suspected) exposure to covid-19: Secondary | ICD-10-CM | POA: Diagnosis present

## 2020-11-22 DIAGNOSIS — Z66 Do not resuscitate: Secondary | ICD-10-CM | POA: Diagnosis not present

## 2020-11-22 DIAGNOSIS — C50919 Malignant neoplasm of unspecified site of unspecified female breast: Secondary | ICD-10-CM | POA: Diagnosis not present

## 2020-11-22 DIAGNOSIS — R410 Disorientation, unspecified: Secondary | ICD-10-CM | POA: Diagnosis not present

## 2020-11-22 DIAGNOSIS — D630 Anemia in neoplastic disease: Secondary | ICD-10-CM | POA: Diagnosis present

## 2020-11-22 DIAGNOSIS — C7800 Secondary malignant neoplasm of unspecified lung: Secondary | ICD-10-CM | POA: Diagnosis present

## 2020-11-22 DIAGNOSIS — E876 Hypokalemia: Secondary | ICD-10-CM | POA: Diagnosis not present

## 2020-11-22 DIAGNOSIS — Y95 Nosocomial condition: Secondary | ICD-10-CM | POA: Diagnosis present

## 2020-11-22 DIAGNOSIS — I2699 Other pulmonary embolism without acute cor pulmonale: Secondary | ICD-10-CM | POA: Diagnosis present

## 2020-11-22 DIAGNOSIS — R1110 Vomiting, unspecified: Secondary | ICD-10-CM | POA: Diagnosis not present

## 2020-11-22 DIAGNOSIS — Z808 Family history of malignant neoplasm of other organs or systems: Secondary | ICD-10-CM

## 2020-11-22 DIAGNOSIS — G894 Chronic pain syndrome: Secondary | ICD-10-CM | POA: Diagnosis present

## 2020-11-22 DIAGNOSIS — J9601 Acute respiratory failure with hypoxia: Secondary | ICD-10-CM | POA: Diagnosis present

## 2020-11-22 DIAGNOSIS — R6520 Severe sepsis without septic shock: Secondary | ICD-10-CM | POA: Diagnosis present

## 2020-11-22 DIAGNOSIS — Z85820 Personal history of malignant melanoma of skin: Secondary | ICD-10-CM

## 2020-11-22 DIAGNOSIS — Z923 Personal history of irradiation: Secondary | ICD-10-CM

## 2020-11-22 DIAGNOSIS — Z79891 Long term (current) use of opiate analgesic: Secondary | ICD-10-CM

## 2020-11-22 DIAGNOSIS — J45909 Unspecified asthma, uncomplicated: Secondary | ICD-10-CM | POA: Diagnosis present

## 2020-11-22 DIAGNOSIS — A419 Sepsis, unspecified organism: Secondary | ICD-10-CM | POA: Diagnosis not present

## 2020-11-22 DIAGNOSIS — Z7189 Other specified counseling: Secondary | ICD-10-CM | POA: Diagnosis not present

## 2020-11-22 DIAGNOSIS — Z881 Allergy status to other antibiotic agents status: Secondary | ICD-10-CM

## 2020-11-22 DIAGNOSIS — Z9221 Personal history of antineoplastic chemotherapy: Secondary | ICD-10-CM

## 2020-11-22 DIAGNOSIS — D638 Anemia in other chronic diseases classified elsewhere: Secondary | ICD-10-CM

## 2020-11-22 DIAGNOSIS — C7989 Secondary malignant neoplasm of other specified sites: Secondary | ICD-10-CM | POA: Diagnosis present

## 2020-11-22 DIAGNOSIS — E871 Hypo-osmolality and hyponatremia: Secondary | ICD-10-CM | POA: Diagnosis not present

## 2020-11-22 DIAGNOSIS — Z853 Personal history of malignant neoplasm of breast: Secondary | ICD-10-CM

## 2020-11-22 DIAGNOSIS — E860 Dehydration: Secondary | ICD-10-CM | POA: Diagnosis present

## 2020-11-22 DIAGNOSIS — R112 Nausea with vomiting, unspecified: Secondary | ICD-10-CM | POA: Diagnosis not present

## 2020-11-22 DIAGNOSIS — S21101A Unspecified open wound of right front wall of thorax without penetration into thoracic cavity, initial encounter: Secondary | ICD-10-CM | POA: Diagnosis present

## 2020-11-22 DIAGNOSIS — K219 Gastro-esophageal reflux disease without esophagitis: Secondary | ICD-10-CM | POA: Diagnosis present

## 2020-11-22 DIAGNOSIS — Z7901 Long term (current) use of anticoagulants: Secondary | ICD-10-CM

## 2020-11-22 DIAGNOSIS — E861 Hypovolemia: Secondary | ICD-10-CM | POA: Diagnosis present

## 2020-11-22 DIAGNOSIS — Z515 Encounter for palliative care: Secondary | ICD-10-CM | POA: Diagnosis not present

## 2020-11-22 DIAGNOSIS — C7951 Secondary malignant neoplasm of bone: Secondary | ICD-10-CM | POA: Diagnosis present

## 2020-11-22 DIAGNOSIS — L598 Other specified disorders of the skin and subcutaneous tissue related to radiation: Secondary | ICD-10-CM | POA: Diagnosis present

## 2020-11-22 DIAGNOSIS — Z79890 Hormone replacement therapy (postmenopausal): Secondary | ICD-10-CM

## 2020-11-22 LAB — CBC WITH DIFFERENTIAL/PLATELET
Abs Immature Granulocytes: 0.97 10*3/uL — ABNORMAL HIGH (ref 0.00–0.07)
Basophils Absolute: 0.1 10*3/uL (ref 0.0–0.1)
Basophils Relative: 0 %
Eosinophils Absolute: 0.1 10*3/uL (ref 0.0–0.5)
Eosinophils Relative: 0 %
HCT: 25.9 % — ABNORMAL LOW (ref 36.0–46.0)
Hemoglobin: 8.2 g/dL — ABNORMAL LOW (ref 12.0–15.0)
Immature Granulocytes: 4 %
Lymphocytes Relative: 1 %
Lymphs Abs: 0.3 10*3/uL — ABNORMAL LOW (ref 0.7–4.0)
MCH: 31.5 pg (ref 26.0–34.0)
MCHC: 31.7 g/dL (ref 30.0–36.0)
MCV: 99.6 fL (ref 80.0–100.0)
Monocytes Absolute: 0.6 10*3/uL (ref 0.1–1.0)
Monocytes Relative: 3 %
Neutro Abs: 20.3 10*3/uL — ABNORMAL HIGH (ref 1.7–7.7)
Neutrophils Relative %: 92 %
Platelets: 108 10*3/uL — ABNORMAL LOW (ref 150–400)
RBC: 2.6 MIL/uL — ABNORMAL LOW (ref 3.87–5.11)
RDW: 18.6 % — ABNORMAL HIGH (ref 11.5–15.5)
WBC Morphology: INCREASED
WBC: 22.3 10*3/uL — ABNORMAL HIGH (ref 4.0–10.5)
nRBC: 0 % (ref 0.0–0.2)

## 2020-11-22 LAB — APTT: aPTT: 35 seconds (ref 24–36)

## 2020-11-22 LAB — COMPREHENSIVE METABOLIC PANEL
ALT: 16 U/L (ref 0–44)
AST: 12 U/L — ABNORMAL LOW (ref 15–41)
Albumin: 2.1 g/dL — ABNORMAL LOW (ref 3.5–5.0)
Alkaline Phosphatase: 123 U/L (ref 38–126)
Anion gap: 9 (ref 5–15)
BUN: 16 mg/dL (ref 6–20)
CO2: 25 mmol/L (ref 22–32)
Calcium: 7.6 mg/dL — ABNORMAL LOW (ref 8.9–10.3)
Chloride: 95 mmol/L — ABNORMAL LOW (ref 98–111)
Creatinine, Ser: 0.96 mg/dL (ref 0.44–1.00)
GFR, Estimated: >60 mL/min (ref >60)
Glucose, Bld: 114 mg/dL — ABNORMAL HIGH (ref 70–99)
Potassium: 4.1 mmol/L (ref 3.5–5.1)
Sodium: 129 mmol/L — ABNORMAL LOW (ref 135–145)
Total Bilirubin: 0.6 mg/dL (ref 0.3–1.2)
Total Protein: 6.1 g/dL — ABNORMAL LOW (ref 6.5–8.1)

## 2020-11-22 LAB — URIC ACID: Uric Acid, Serum: 3.9 mg/dL (ref 2.5–7.1)

## 2020-11-22 LAB — RESP PANEL BY RT-PCR (FLU A&B, COVID) ARPGX2
Influenza A by PCR: NEGATIVE
Influenza B by PCR: NEGATIVE
SARS Coronavirus 2 by RT PCR: NEGATIVE

## 2020-11-22 LAB — PROTIME-INR
INR: 1.3 — ABNORMAL HIGH (ref 0.8–1.2)
Prothrombin Time: 15.7 seconds — ABNORMAL HIGH (ref 11.4–15.2)

## 2020-11-22 LAB — MRSA NEXT GEN BY PCR, NASAL: MRSA by PCR Next Gen: NOT DETECTED

## 2020-11-22 LAB — HCG, QUANTITATIVE, PREGNANCY: hCG, Beta Chain, Quant, S: 329 m[IU]/mL — ABNORMAL HIGH (ref <5)

## 2020-11-22 LAB — LACTIC ACID, PLASMA: Lactic Acid, Venous: 1.1 mmol/L (ref 0.5–1.9)

## 2020-11-22 IMAGING — DX DG CHEST 1V PORT
1 series · 1 of 1 positions shown · non-contrast
Comparison: [DATE]

CLINICAL DATA: Undergoing treatment for breast cancer. Question
sepsis.

EXAM:
PORTABLE CHEST 1 VIEW

[chest ap]
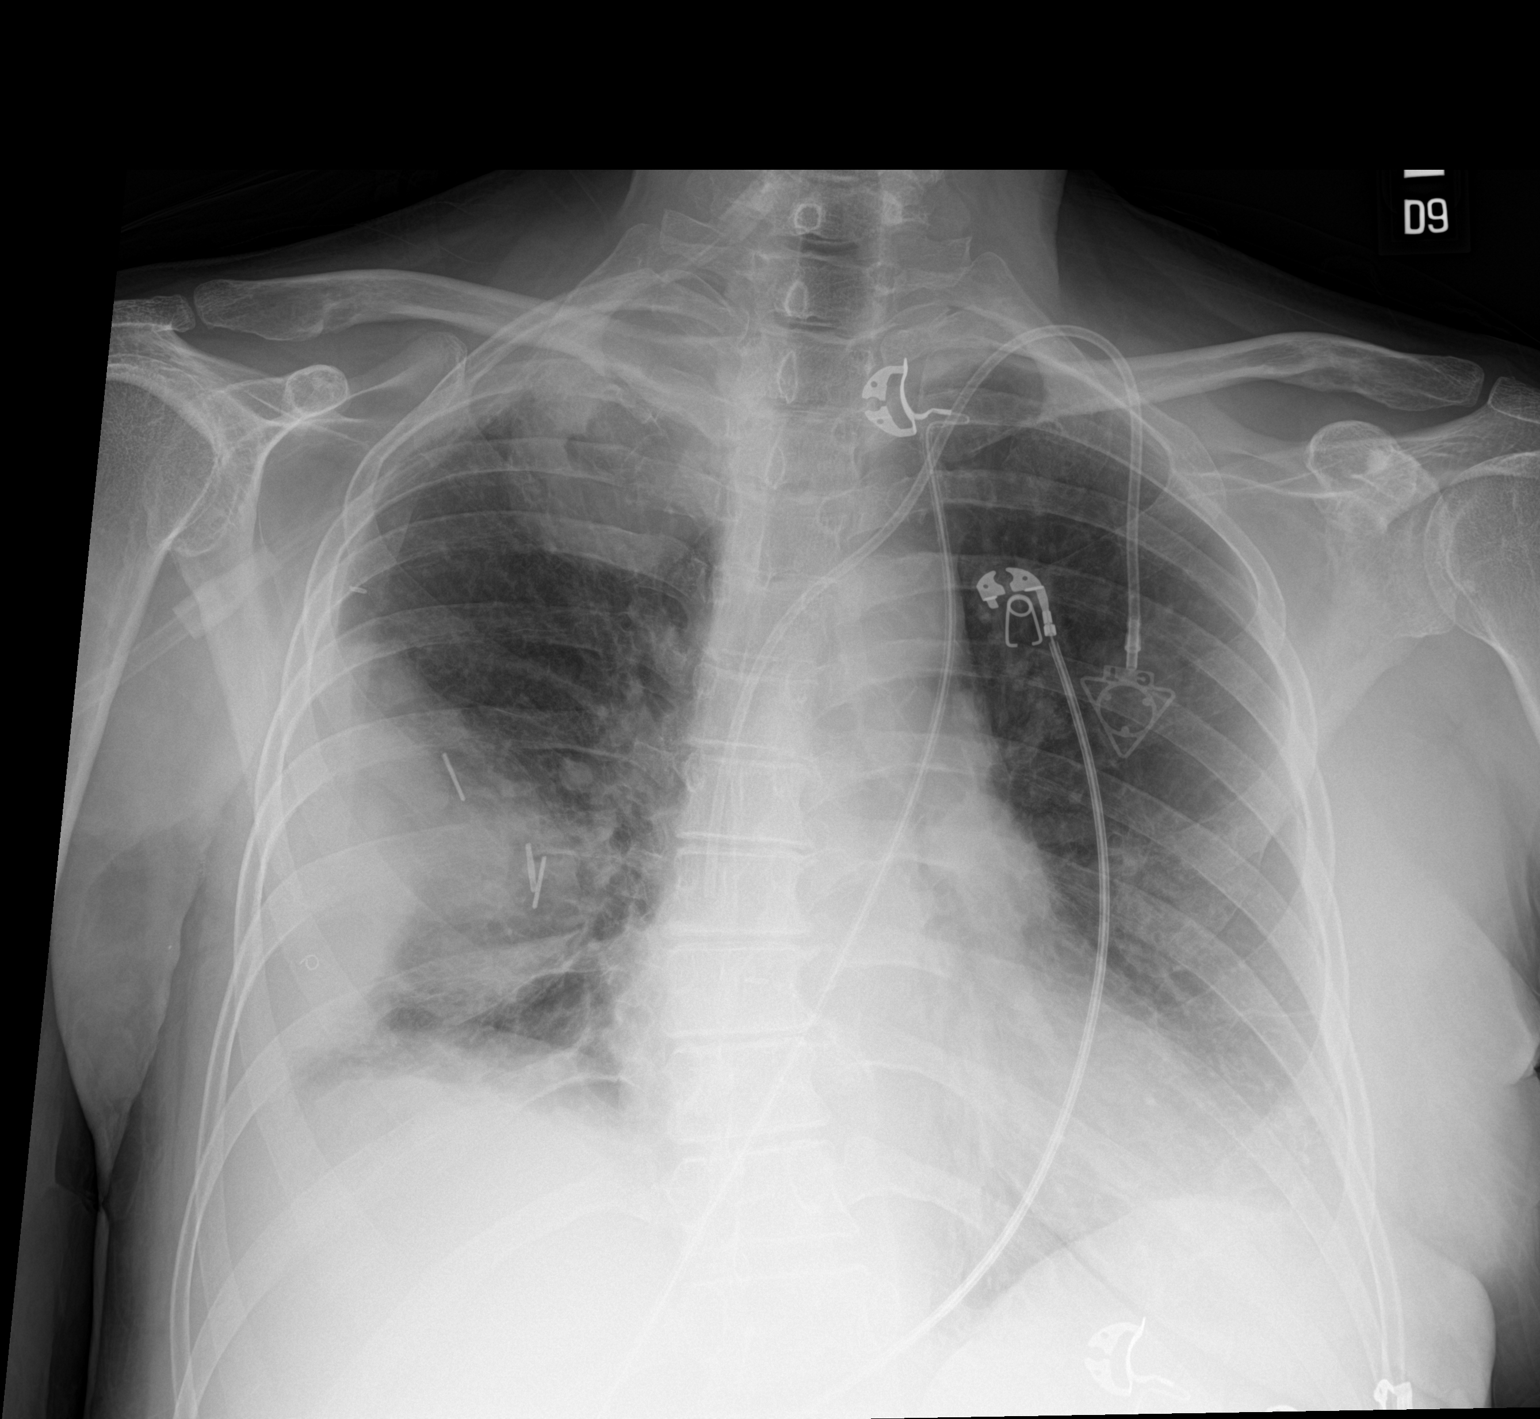

[1 of 1 positions shown; findings below may reference images not displayed]

FINDINGS: Power port inserted from the left has its tip at the SVC RA
junction. Left lung shows a stable infrahilar density consistent
with metastatic disease. Increasing mass densities at the right apex
and right lateral lower lung. This could be due to growth of tumor.
Some adjacent pneumonia not excluded. No significant pleural fluid
accumulation.
IMPRESSION: Increasing mass like densities at the right apex and lower lateral
right lung presumed represent progressive tumor. Some adjacent
pulmonary infiltrate not excluded.

Infrahilar pulmonary density on the left, proximally stable since
the prior studies peer

## 2020-11-22 MED ORDER — ONDANSETRON HCL 4 MG/2ML IJ SOLN
4.0000 mg | Freq: Once | INTRAMUSCULAR | Status: AC
  Administered 2020-11-22: 4 mg via INTRAVENOUS
  Filled 2020-11-22: qty 2

## 2020-11-22 MED ORDER — LACTATED RINGERS IV BOLUS
1000.0000 mL | Freq: Once | INTRAVENOUS | Status: AC
  Administered 2020-11-22: 1000 mL via INTRAVENOUS

## 2020-11-22 MED ORDER — HYDROMORPHONE HCL 1 MG/ML IJ SOLN
1.0000 mg | Freq: Once | INTRAMUSCULAR | Status: AC
  Administered 2020-11-22: 1 mg via INTRAVENOUS
  Filled 2020-11-22: qty 1

## 2020-11-22 MED ORDER — POLYETHYLENE GLYCOL 3350 17 G PO PACK
17.0000 g | PACK | Freq: Every day | ORAL | Status: DC
  Administered 2020-11-22 – 2020-12-02 (×10): 17 g via ORAL
  Filled 2020-11-22 (×11): qty 1

## 2020-11-22 MED ORDER — APIXABAN 5 MG PO TABS
5.0000 mg | ORAL_TABLET | Freq: Two times a day (BID) | ORAL | Status: DC
  Administered 2020-11-22 – 2020-11-29 (×14): 5 mg via ORAL
  Filled 2020-11-22 (×14): qty 1

## 2020-11-22 MED ORDER — HYDROMORPHONE HCL 1 MG/ML IJ SOLN
1.0000 mg | INTRAMUSCULAR | Status: DC | PRN
  Administered 2020-11-22 – 2020-12-01 (×9): 1 mg via INTRAVENOUS
  Filled 2020-11-22 (×12): qty 1

## 2020-11-22 MED ORDER — SODIUM CHLORIDE 0.9 % IV SOLN: INTRAVENOUS | Status: DC

## 2020-11-22 MED ORDER — LIDOCAINE 5 % EX PTCH
1.0000 | MEDICATED_PATCH | Freq: Every day | CUTANEOUS | Status: DC | PRN
  Filled 2020-11-22 (×2): qty 1

## 2020-11-22 MED ORDER — BUPROPION HCL ER (XL) 150 MG PO TB24
150.0000 mg | ORAL_TABLET | Freq: Every day | ORAL | Status: DC
  Administered 2020-11-23 – 2020-12-03 (×11): 150 mg via ORAL
  Filled 2020-11-22 (×11): qty 1

## 2020-11-22 MED ORDER — LEVOTHYROXINE SODIUM 75 MCG PO TABS
75.0000 ug | ORAL_TABLET | Freq: Every day | ORAL | Status: DC
  Administered 2020-11-23 – 2020-12-03 (×11): 75 ug via ORAL
  Filled 2020-11-22 (×11): qty 1

## 2020-11-22 MED ORDER — ONDANSETRON HCL 4 MG PO TABS
4.0000 mg | ORAL_TABLET | Freq: Four times a day (QID) | ORAL | Status: DC | PRN
  Administered 2020-11-27: 4 mg via ORAL
  Filled 2020-11-22: qty 1

## 2020-11-22 MED ORDER — HYDROMORPHONE HCL 2 MG PO TABS
8.0000 mg | ORAL_TABLET | ORAL | Status: DC | PRN
Start: 2020-11-22 — End: 2020-12-04
  Administered 2020-11-23 – 2020-12-03 (×20): 8 mg via ORAL
  Filled 2020-11-22 (×21): qty 4

## 2020-11-22 MED ORDER — GABAPENTIN 300 MG PO CAPS
300.0000 mg | ORAL_CAPSULE | Freq: Three times a day (TID) | ORAL | Status: DC
  Administered 2020-11-22 – 2020-12-03 (×33): 300 mg via ORAL
  Filled 2020-11-22 (×34): qty 1

## 2020-11-22 MED ORDER — ONDANSETRON HCL 4 MG/2ML IJ SOLN
4.0000 mg | Freq: Four times a day (QID) | INTRAMUSCULAR | Status: DC | PRN
  Administered 2020-11-26: 4 mg via INTRAVENOUS
  Filled 2020-11-22 (×2): qty 2

## 2020-11-22 MED ORDER — HYDROMORPHONE HCL 1 MG/ML IJ SOLN
1.0000 mg | Freq: Once | INTRAMUSCULAR | Status: AC
Start: 2020-11-22 — End: 2020-11-22
  Administered 2020-11-22: 1 mg via INTRAVENOUS
  Filled 2020-11-22: qty 1

## 2020-11-22 MED ORDER — SODIUM CHLORIDE 0.9 % IV SOLN
2.0000 g | Freq: Two times a day (BID) | INTRAVENOUS | Status: DC
  Administered 2020-11-23 (×2): 2 g via INTRAVENOUS
  Filled 2020-11-22 (×2): qty 2

## 2020-11-22 MED ORDER — SODIUM CHLORIDE 0.9 % IV SOLN
2.0000 g | Freq: Once | INTRAVENOUS | Status: AC
  Administered 2020-11-22: 2 g via INTRAVENOUS
  Filled 2020-11-22: qty 2

## 2020-11-22 MED ORDER — METHADONE HCL 10 MG PO TABS
10.0000 mg | ORAL_TABLET | Freq: Three times a day (TID) | ORAL | Status: DC
Start: 2020-11-22 — End: 2020-11-23
  Administered 2020-11-22 – 2020-11-23 (×4): 10 mg via ORAL
  Filled 2020-11-22 (×2): qty 1
  Filled 2020-11-22: qty 2
  Filled 2020-11-22: qty 1

## 2020-11-22 MED ORDER — ACETAMINOPHEN 500 MG PO TABS
1000.0000 mg | ORAL_TABLET | Freq: Once | ORAL | Status: AC
  Administered 2020-11-23: 1000 mg via ORAL
  Filled 2020-11-22: qty 2
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
## 2020-11-22 NOTE — ED Notes (Signed)
Hospitalist at the bedside 

## 2020-11-22 NOTE — ED Notes (Signed)
Pt placed on 2L nasal cannula O2. Current O2 saturation at 98%

## 2020-11-22 NOTE — ED Notes (Signed)
EDP at the bedside for re-assessment.

## 2020-11-22 NOTE — Progress Notes (Signed)
Pharmacy Antibiotic Note  Kara Pittman is a 55 y.o. female admitted on 11/22/2020 with pneumonia.  Pharmacy has been consulted for cefepime dosing.  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    Allergies  Allergen Reactions   Morphine And Related Hives   Hydrocodone-Acetaminophen Nausea Only   Vancomycin Other (See Comments)    Redman's Syndrome     Thank you for allowing pharmacy to be a part of this patient's care.  Kara Mead 11/22/2020 5:29 PM

## 2020-11-22 NOTE — Telephone Encounter (Signed)
Transition Care Management Unsuccessful Follow-up Telephone Call  Date of discharge and from where:  11/18/2020-[facility]  Attempts:  2nd Attempt  Reason for unsuccessful TCM follow-up call:  Left voice message

## 2020-11-22 NOTE — ED Triage Notes (Signed)
Pt presents to the ED from home for reported fever of 102F at home. Per EMS, pt was febrile around 0800 this morning. Per EMS, the pt called her Oncologist and was told to take Tylenol and come to the ED. Pt took Tylenol prior ot arrival to the ED. Pt is currently undergoing chemotherapy and radiation for breast cancer. Pt has received radiation within the last 18days and was supposed to undergo chemo today.

## 2020-11-22 NOTE — ED Notes (Signed)
EDP at the bedside.  ?
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
## 2020-11-22 NOTE — Progress Notes (Signed)
A consult was received from an ED physician for cefepime per pharmacy dosing (for an indication other than meningitis). The patient's profile has been reviewed for ht/wt/allergies/indication/available labs. A one time order has been placed for the above antibiotics.  Further antibiotics/pharmacy consults should be ordered by admitting physician if indicated.                       Reuel Boom, PharmD, BCPS [phone] 11/22/2020, 2:54 PM

## 2020-11-23 ENCOUNTER — Other Ambulatory Visit: Payer: Self-pay

## 2020-11-23 DIAGNOSIS — J9601 Acute respiratory failure with hypoxia: Secondary | ICD-10-CM | POA: Diagnosis not present

## 2020-11-23 DIAGNOSIS — J189 Pneumonia, unspecified organism: Secondary | ICD-10-CM | POA: Diagnosis not present

## 2020-11-23 DIAGNOSIS — D638 Anemia in other chronic diseases classified elsewhere: Secondary | ICD-10-CM | POA: Diagnosis not present

## 2020-11-23 DIAGNOSIS — A419 Sepsis, unspecified organism: Secondary | ICD-10-CM | POA: Diagnosis not present

## 2020-11-23 LAB — URINALYSIS, ROUTINE W REFLEX MICROSCOPIC
Bacteria, UA: NONE SEEN
Bilirubin Urine: NEGATIVE
Glucose, UA: NEGATIVE mg/dL
Ketones, ur: NEGATIVE mg/dL
Leukocytes,Ua: NEGATIVE
Nitrite: NEGATIVE
Protein, ur: NEGATIVE mg/dL
Specific Gravity, Urine: 1.012 (ref 1.005–1.030)
pH: 8 (ref 5.0–8.0)

## 2020-11-23 LAB — HIV ANTIBODY (ROUTINE TESTING W REFLEX): HIV Screen 4th Generation wRfx: NONREACTIVE

## 2020-11-23 LAB — URINE CULTURE: Culture: NO GROWTH

## 2020-11-23 LAB — COMPREHENSIVE METABOLIC PANEL
ALT: 13 U/L (ref 0–44)
AST: 10 U/L — ABNORMAL LOW (ref 15–41)
Albumin: 1.8 g/dL — ABNORMAL LOW (ref 3.5–5.0)
Alkaline Phosphatase: 103 U/L (ref 38–126)
Anion gap: 6 (ref 5–15)
BUN: 12 mg/dL (ref 6–20)
CO2: 22 mmol/L (ref 22–32)
Calcium: 7.2 mg/dL — ABNORMAL LOW (ref 8.9–10.3)
Chloride: 100 mmol/L (ref 98–111)
Creatinine, Ser: 0.38 mg/dL — ABNORMAL LOW (ref 0.44–1.00)
GFR, Estimated: >60 mL/min (ref >60)
Glucose, Bld: 97 mg/dL (ref 70–99)
Potassium: 4.1 mmol/L (ref 3.5–5.1)
Sodium: 128 mmol/L — ABNORMAL LOW (ref 135–145)
Total Bilirubin: 0.5 mg/dL (ref 0.3–1.2)
Total Protein: 5.6 g/dL — ABNORMAL LOW (ref 6.5–8.1)

## 2020-11-23 LAB — STREP PNEUMONIAE URINARY ANTIGEN: Strep Pneumo Urinary Antigen: NEGATIVE

## 2020-11-23 LAB — CBC
HCT: 22.2 % — ABNORMAL LOW (ref 36.0–46.0)
Hemoglobin: 6.9 g/dL — CL (ref 12.0–15.0)
MCH: 30.9 pg (ref 26.0–34.0)
MCHC: 31.1 g/dL (ref 30.0–36.0)
MCV: 99.6 fL (ref 80.0–100.0)
Platelets: 91 10*3/uL — ABNORMAL LOW (ref 150–400)
RBC: 2.23 MIL/uL — ABNORMAL LOW (ref 3.87–5.11)
RDW: 18.6 % — ABNORMAL HIGH (ref 11.5–15.5)
WBC: 15 10*3/uL — ABNORMAL HIGH (ref 4.0–10.5)
nRBC: 0 % (ref 0.0–0.2)

## 2020-11-23 LAB — ABO/RH: ABO/RH(D): O POS

## 2020-11-23 LAB — PREPARE RBC (CROSSMATCH)

## 2020-11-23 MED ORDER — VANCOMYCIN HCL 1250 MG/250ML IV SOLN
1250.0000 mg | Freq: Once | INTRAVENOUS | Status: AC
  Administered 2020-11-23: 1250 mg via INTRAVENOUS
  Filled 2020-11-23: qty 250

## 2020-11-23 MED ORDER — ACETAMINOPHEN 325 MG PO TABS
650.0000 mg | ORAL_TABLET | Freq: Four times a day (QID) | ORAL | Status: AC | PRN
Start: 2020-11-23 — End: 2020-11-24
  Administered 2020-11-23 – 2020-11-24 (×2): 650 mg via ORAL
  Filled 2020-11-23 (×2): qty 2

## 2020-11-23 MED ORDER — SODIUM CHLORIDE 0.9 % IV SOLN
2.0000 g | Freq: Three times a day (TID) | INTRAVENOUS | Status: DC
  Administered 2020-11-23 – 2020-11-29 (×17): 2 g via INTRAVENOUS
  Filled 2020-11-23 (×18): qty 2

## 2020-11-23 MED ORDER — VANCOMYCIN HCL 1500 MG/300ML IV SOLN
1500.0000 mg | INTRAVENOUS | Status: DC
  Administered 2020-11-24 – 2020-11-28 (×5): 1500 mg via INTRAVENOUS
  Filled 2020-11-23 (×5): qty 300

## 2020-11-23 MED ORDER — SODIUM CHLORIDE 0.9% FLUSH
10.0000 mL | INTRAVENOUS | Status: DC | PRN
  Administered 2020-12-01 – 2020-12-03 (×2): 10 mL

## 2020-11-23 MED ORDER — CHLORHEXIDINE GLUCONATE CLOTH 2 % EX PADS
6.0000 | MEDICATED_PAD | Freq: Every day | CUTANEOUS | Status: DC
  Administered 2020-11-23 – 2020-12-03 (×10): 6 via TOPICAL

## 2020-11-23 MED ORDER — METHADONE HCL 10 MG PO TABS
10.0000 mg | ORAL_TABLET | Freq: Three times a day (TID) | ORAL | Status: DC
Start: 2020-11-23 — End: 2020-11-25
  Administered 2020-11-24 – 2020-11-25 (×5): 10 mg via ORAL
  Filled 2020-11-23 (×5): qty 1

## 2020-11-23 MED ORDER — SODIUM CHLORIDE 0.9% FLUSH
10.0000 mL | Freq: Two times a day (BID) | INTRAVENOUS | Status: DC
  Administered 2020-11-24 – 2020-12-03 (×10): 10 mL

## 2020-11-23 MED ORDER — FAMOTIDINE IN NACL 20-0.9 MG/50ML-% IV SOLN
20.0000 mg | INTRAVENOUS | Status: DC
  Administered 2020-11-23 – 2020-11-28 (×6): 20 mg via INTRAVENOUS
  Filled 2020-11-23 (×7): qty 50

## 2020-11-23 MED ORDER — SODIUM CHLORIDE 0.9% IV SOLUTION: Freq: Once | INTRAVENOUS | Status: AC
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
## 2020-11-23 NOTE — Progress Notes (Signed)
PROGRESS NOTE    Kara Pittman  UJW:[id number] DOB: 1965/06/21 DOA: 11/22/2020 PCP: [name], MD    Brief Narrative:  Mrs. Kara Pittman was admitted to the hospital with the working diagnosis of sepsis due to pneumonia.   55 year old female past medical history for metastatic breast cancer, hypothyroidism, chronic pain syndrome, GERD and depression who presented not feeling well.  Patient reported several days of coughing, patient had fever, nausea and weakness.  Recent hospitalization at [facility] about 18 days ago for similar presentation, discharged 10/14 on analgesic regimen.  Apparently infection was ruled out, fevers are presumed to be related to malignancy (large tumor burden).  She did not received any antibiotic therapy.  Her temperature on admission 98.7, heart rate 123, blood pressure 98/73, oxygen saturation 85% on room air, her lungs are clear to auscultation bilaterally, heart S1-S2, present, rhythmic, her abdomen was distended, no lower extremity edema.  Sodium 129, potassium 4.1, chloride 95, bicarb 25, glucose 114, BUN 16, creatinine 0.96, white count 22.3, hemoglobin 8.2, hematocrit 25.9, platelets 108. SARS COVID-19 negative.  Urinalysis specific gravity 1.012, 0 having 5 white cells.  Chest radiograph with large round opacities right lower lobe and left upper lobe.  EKG 120 bpm, normal axis, normal intervals, sinus rhythm, no significant ST segment or T wave changes, low voltage.  Assessment & Plan:   Principal Problem:   Sepsis ([place]) Active Problems:   Anemia of chronic disease   Acute respiratory failure with hypoxia (HCC)   Healthcare-associated pneumonia   Sepsis due to post obstructive pneumonia.  Patient with improvement in dyspnea but not yet back to baseline, continue to have chest pain.  Wbc is 15,0, blood cultures no growth.  Blood pressure 97/68 and HR 103 this am.   Plan to continue antibiotic therapy with cefepime and will add IV  vancomycin. Patient has a radiation induced skin lesion on her left breast, with ulcerated wound, with positive secretion, that can not ruled out to be infected.  Continue with IV fluids 100 ml per Hr.   2. Hyponatremia, hypovolemic Serum Na is 128 this am, serum cr is 0,38 and bicarbonate at 22. Will continue volume repletion with isotonic saline, will increase rate to 100 ml per hr.  Patient had [place] in the past.   3. 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  She had a right mastectomy May 2021 followed by multiple rounds of chemotherapy and immunotherapy.  Unfortunately developed widespread metastasis to the chest wall, lungs, liver, adrenals and bone.  Severe pain right chest, axilla. In August 2022 she was diagnosed with pulmonary embolism.  Continue pain control with methadone and hydromorphone  Hgb down to 6,7 after volume resuscitation, getting today PRBC transfusion.   4. Hypothyroid. Continue with levothyroxine  5. Depression. Continue with bupropion   6. Pulmonary embolism. Continue anticoagulation with apixaban.   Patient continue to be at high risk for worsening sepsis   Status is: Inpatient  Remains inpatient appropriate because: IV antibiotic therapy  DVT prophylaxis: Apixaban   Code Status:    full  Family Communication:  No family at the bedside    Antimicrobials:  Vancomycin and cefepime     Subjective: Patient with no nausea or vomiting, dyspnea improved but not yet back to baseline, positive chest pain and dyspnea.   Objective: Vitals:   11/23/20 0856 11/23/20 1217 11/23/20 1249 11/23/20 1256  BP: 98/75 96/64 97/68  97/68  Pulse: 100 Marland Kitchen)  103 (!) 102 (!) 103  Resp: 18 18 16 16   Temp: 98 F (36.7 C) 99.5 F (37.5 C) 97.9 F (36.6 C) 97.9 F (36.6 C)  TempSrc:   Oral   SpO2: 99% 99% 100% 100%  Weight:      Height:        Intake/Output  Summary (Last 24 hours) at 11/23/2020 1359 Last data filed at 11/23/2020 1217 Gross per 24 hour  Intake 2190 ml  Output 2001 ml  Net 189 ml   Filed Weights   11/22/20 1231  Weight: 64.4 kg    Examination:   General: deconditioned and ill looking appearing  Neurology: Awake and alert, non focal  E ENT: mild pallor, no icterus, oral mucosa moist Cardiovascular: No JVD. S1-S2 present, rhythmic, no gallops, rubs, or murmurs. No lower extremity edema. Pulmonary: positive breath sounds bilaterally, no wheezing or rhonchi on anterior auscultation.  Gastrointestinal. Abdomen soft and non tender Skin.extensive skin radiation injury, with open wound at the right upper quadrant with positive secretion  Musculoskeletal: no joint deformities     Data Reviewed: I have personally reviewed following labs and imaging studies  CBC: Recent Labs  Lab 11/22/20 1230 11/23/20 0431  WBC 22.3* 15.0*  NEUTROABS 20.3*  --   HGB 8.2* 6.9*  HCT 25.9* 22.2*  MCV 99.6 99.6  PLT 108* 91*   Basic Metabolic Panel: Recent Labs  Lab 11/22/20 1230 11/23/20 0431  NA 129* 128*  K 4.1 4.1  CL 95* 100  CO2 25 22  GLUCOSE 114* 97  BUN 16 12  CREATININE 0.96 0.38*  CALCIUM 7.6* 7.2*   GFR: Estimated Creatinine Clearance: 69.8 mL/min (A) (by C-G formula based on SCr of 0.38 mg/dL (L)). Liver Function Tests: Recent Labs  Lab 11/22/20 1230 11/23/20 0431  AST 12* 10*  ALT 16 13  ALKPHOS 123 103  BILITOT 0.6 0.5  PROT 6.1* 5.6*  ALBUMIN 2.1* 1.8*   No results for input(s): LIPASE, AMYLASE in the last 168 hours. No results for input(s): AMMONIA in the last 168 hours. Coagulation Profile: Recent Labs  Lab 11/22/20 1230  INR 1.3*   Cardiac Enzymes: No results for input(s): CKTOTAL, CKMB, CKMBINDEX, TROPONINI in the last 168 hours. BNP (last 3 results) No results for input(s): PROBNP in the last 8760 hours. HbA1C: No results for input(s): HGBA1C in the last 72 hours. CBG: No results  for input(s): GLUCAP in the last 168 hours. Lipid Profile: No results for input(s): CHOL, HDL, LDLCALC, TRIG, CHOLHDL, LDLDIRECT in the last 72 hours. Thyroid Function Tests: No results for input(s): TSH, T4TOTAL, FREET4, T3FREE, THYROIDAB in the last 72 hours. Anemia Panel: No results for input(s): VITAMINB12, FOLATE, FERRITIN, TIBC, IRON, RETICCTPCT in the last 72 hours.    Radiology Studies: I have reviewed all of the imaging during this hospital visit personally     Scheduled Meds:  sodium chloride   Intravenous Once   apixaban  5 mg Oral BID   buPROPion  150 mg Oral Daily   Chlorhexidine Gluconate Cloth  6 each Topical Daily   gabapentin  300 mg Oral TID   levothyroxine  75 mcg Oral QAC breakfast   methadone  10 mg Oral Q8H   polyethylene glycol  17 g Oral Daily   sodium chloride flush  10-40 mL Intracatheter Q12H   Continuous Infusions:  sodium chloride 75 mL/hr at 11/22/20 2101   ceFEPime (MAXIPIME) IV 2 g (11/23/20 0356)     LOS: 1 day  Angelisse Riso Gerome Apley, MD

## 2020-11-23 NOTE — Progress Notes (Addendum)
Pharmacy Antibiotic Note  Kara Pittman is a 55 y.o. female admitted on 11/22/2020 with pneumonia and radiation-induced skin lesion on L breast with ulcerated wound and secretion.  Pharmacy has been consulted for cefepime and vancomycin dosing.  Noted vancomycin infusion reaction noted in allergies from previous admission. Discussed with patient options of slowing down infusion rate and pre-treating with antihistamines, and patient is willing to proceed with vancomycin use.  RN aware to monitor patient for s/sx infusion rash.   Plan: Vancomycin 1250mg  IV x1, followed by Vancomycin 1500mg  IV q24 hours (eAUC 518, Scr 0.8) Adjust cefepime to 2g IV q8 hours Monitor for signs of vancomycin infusion syndrome - increase infusion time to 120 minutes Add Pepcid 20mg  IV q24 hours prior to vancomycin administration   Height: 5\' 5"  (165.1 cm) Weight: 64.4 kg (142 lb) IBW/kg (Calculated) : 57  Temp (24hrs), Avg:98.7 F (37.1 C), Min:97.9 F (36.6 C), Max:100.4 F (38 C)  Recent Labs  Lab 11/22/20 1230 11/23/20 0431  WBC 22.3* 15.0*  CREATININE 0.96 0.38*  LATICACIDVEN 1.1  --      Estimated Creatinine Clearance: 69.8 mL/min (A) (by C-G formula based on SCr of 0.38 mg/dL (L)).    Allergies  Allergen Reactions   Morphine And Related Hives   Hydrocodone-Acetaminophen Nausea Only   Vancomycin Other (See Comments)    Redman's Syndrome     Thank you for allowing pharmacy to be a part of this patient's care.  Dimple Nanas, PharmD 11/23/2020 3:31 PM

## 2020-11-24 DIAGNOSIS — A419 Sepsis, unspecified organism: Secondary | ICD-10-CM | POA: Diagnosis not present

## 2020-11-24 DIAGNOSIS — D638 Anemia in other chronic diseases classified elsewhere: Secondary | ICD-10-CM | POA: Diagnosis not present

## 2020-11-24 DIAGNOSIS — J189 Pneumonia, unspecified organism: Secondary | ICD-10-CM | POA: Diagnosis not present

## 2020-11-24 DIAGNOSIS — J9601 Acute respiratory failure with hypoxia: Secondary | ICD-10-CM | POA: Diagnosis not present

## 2020-11-24 LAB — BPAM RBC
Blood Product Expiration Date: 202211202359
ISSUE DATE / TIME: 202210191225
Unit Type and Rh: 5100

## 2020-11-24 LAB — CBC WITH DIFFERENTIAL/PLATELET
Abs Immature Granulocytes: 0.23 10*3/uL — ABNORMAL HIGH (ref 0.00–0.07)
Basophils Absolute: 0 10*3/uL (ref 0.0–0.1)
Basophils Relative: 0 %
Eosinophils Absolute: 0.2 10*3/uL (ref 0.0–0.5)
Eosinophils Relative: 2 %
HCT: 24.8 % — ABNORMAL LOW (ref 36.0–46.0)
Hemoglobin: 7.7 g/dL — ABNORMAL LOW (ref 12.0–15.0)
Immature Granulocytes: 2 %
Lymphocytes Relative: 3 %
Lymphs Abs: 0.4 10*3/uL — ABNORMAL LOW (ref 0.7–4.0)
MCH: 30.8 pg (ref 26.0–34.0)
MCHC: 31 g/dL (ref 30.0–36.0)
MCV: 99.2 fL (ref 80.0–100.0)
Monocytes Absolute: 0.4 10*3/uL (ref 0.1–1.0)
Monocytes Relative: 3 %
Neutro Abs: 12.9 10*3/uL — ABNORMAL HIGH (ref 1.7–7.7)
Neutrophils Relative %: 90 %
Platelets: 70 10*3/uL — ABNORMAL LOW (ref 150–400)
RBC: 2.5 MIL/uL — ABNORMAL LOW (ref 3.87–5.11)
RDW: 18.1 % — ABNORMAL HIGH (ref 11.5–15.5)
WBC: 14.1 10*3/uL — ABNORMAL HIGH (ref 4.0–10.5)
nRBC: 0 % (ref 0.0–0.2)

## 2020-11-24 LAB — LEGIONELLA PNEUMOPHILA SEROGP 1 UR AG: L. pneumophila Serogp 1 Ur Ag: NEGATIVE

## 2020-11-24 LAB — BASIC METABOLIC PANEL
Anion gap: 6 (ref 5–15)
BUN: 10 mg/dL (ref 6–20)
CO2: 23 mmol/L (ref 22–32)
Calcium: 7 mg/dL — ABNORMAL LOW (ref 8.9–10.3)
Chloride: 103 mmol/L (ref 98–111)
Creatinine, Ser: 0.66 mg/dL (ref 0.44–1.00)
GFR, Estimated: >60 mL/min (ref >60)
Glucose, Bld: 97 mg/dL (ref 70–99)
Potassium: 3.5 mmol/L (ref 3.5–5.1)
Sodium: 132 mmol/L — ABNORMAL LOW (ref 135–145)

## 2020-11-24 LAB — TYPE AND SCREEN
ABO/RH(D): O POS
Antibody Screen: NEGATIVE
Unit division: 0

## 2020-11-24 MED ORDER — LIP MEDEX EX OINT
TOPICAL_OINTMENT | CUTANEOUS | Status: AC
  Filled 2020-11-24: qty 7

## 2020-11-24 NOTE — Telephone Encounter (Signed)
Transition Care Management Follow-up Telephone Call Date of discharge and from where: 11/18/2020 from [place] How have you been since you were released from the hospital? Pt stated that she is feeling okay. Pt is currently admitted to inpatient at [facility] Any questions or concerns? No

## 2020-11-24 NOTE — Progress Notes (Signed)
Mobility Specialist - Progress Note    11/24/20 1126  Oxygen Therapy  SpO2 98 %  O2 Device Nasal Cannula  O2 Flow Rate (L/min) 2 L/min  Mobility  Activity Ambulated in hall  Level of Assistance Standby assist, set-up cues, supervision of patient - no hands on  Assistive Device Other (Comment) (IV Pole)  Distance Ambulated (ft) 310 ft  Mobility Ambulated with assistance in hallway  Mobility Response Tolerated well  Mobility performed by Mobility specialist  $Mobility charge 1 Mobility    Pre-mobility: 98% SpO2 During mobility: 115 HR, 98%  SpO2 Post-mobility: 96 HR, 98% SPO2  Upon entry pt was agreeable to ambulate, but stated feeling weaker than usual. Pt stayed connected to 2L of O2 during session and pushed IV pole for stability while ambulating 300 ft in hallway. Pt reported no pain, dizziness, or SOB, but did require 1 standing rest break. O2 levels monitored throughout session and recorded above. Pt returned to room and was left in recliner with call bell at side.   [place] Specialist Acute Rehabilitation Services Phone: [phone] 11/24/20, 11:29 AM

## 2020-11-24 NOTE — Progress Notes (Addendum)
PROGRESS NOTE    Kara Pittman  CXK:[id number] DOB: [birth year] DOA: 11/22/2020 PCP: [name], MD    Brief Narrative:  Kara Pittman was admitted to the hospital with the working diagnosis of sepsis due to pneumonia.    55 year old female past medical history for metastatic breast cancer, hypothyroidism, chronic pain syndrome, GERD and depression who presented not feeling well.  Patient reported several days of coughing, patient had fever, nausea and weakness.  Recent hospitalization at [facility] about 18 days ago for similar presentation, discharged 10/14 on analgesic regimen.  Apparently infection was ruled out, fevers are presumed to be related to malignancy (large tumor burden).  She did not received any antibiotic therapy.   Her temperature on admission 98.7, heart rate 123, blood pressure 98/73, oxygen saturation 85% on room air, her lungs are clear to auscultation bilaterally, heart S1-S2, present, rhythmic, her abdomen was distended, no lower extremity edema.   Sodium 129, potassium 4.1, chloride 95, bicarb 25, glucose 114, BUN 16, creatinine 0.96, white count 22.3, hemoglobin 8.2, hematocrit 25.9, platelets 108. SARS COVID-19 negative.   Urinalysis specific gravity 1.012, 0 having 5 white cells.   Chest radiograph with large round opacities right lower lobe and left upper lobe.   EKG 120 bpm, normal axis, normal intervals, sinus rhythm, no significant ST segment or T wave changes, low voltage.   Assessment & Plan:   Principal Problem:   Sepsis ([place]) Active Problems:   Anemia of chronic disease   Acute respiratory failure with hypoxia (HCC)   Healthcare-associated pneumonia   Sepsis due to post obstructive pneumonia.  Continue to have pain and dyspnea, early this am had a fever spike up to 100.9 F Today wbc is down to 14.1 and cultures continue with no growth.  Systolic blood pressure 98 to 100.    Antibiotic therapy with cefepime and will add IV  vancomycin.  Patient has a radiation induced skin lesion on her left breast, with ulcerated wound, with positive secretion, that can not ruled out to be infected.   IV fluids will continue at 100 ml per hr for volume repletion, no clinical signs of volume overload.    2. Hyponatremia, hypovolemic  Patient is tolerating po well, serum Na is 132, K is 3,5 and serum bicarbonate at 23. Renal function with serum cr at 0,66  Continue IV fluids with isotonic saline at 100 ml per hr.    3. 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  She had a right mastectomy May 2021 followed by multiple rounds of chemotherapy and immunotherapy.  Unfortunately developed widespread metastasis to the chest wall, lungs, liver, adrenals and bone.  Severe pain right chest, axilla. In August 2022 she was diagnosed with pulmonary embolism.   On methadone and hydromorphone for pain control.  Continue with gabapentin.    Sp one unit PRBC transfusion with follow up Hgb up to 7.7, platelets today 70.  No signs of bleeding .    4. Hypothyroid. On  levothyroxine   5. Depression. On bupropion    6. Pulmonary embolism.  Continue with apixaban for anticoagulation     Patient continue to be at high risk for worsening sepsis   Status is: Inpatient  Remains inpatient appropriate because: IV antibiotic therapy   DVT prophylaxis: Enoxaparin   Code Status:    full  Family Communication:   I spoke over the phone with the patient's mother about patient's  condition, plan of care, prognosis and all questions were addressed.     Antimicrobials:  Vancomycin and ceferpme     Subjective: Patient with persistent pain and dyspnea, no nausea or vomiting, this am out of bed to chair. Her blood pressure has been low.   Objective: Vitals:   11/24/20 0932 11/24/20 1044 11/24/20 1045 11/24/20 1126  BP: 98/66 (!) 87/69  (!) 87/69   Pulse: (!) 111 (!) 103 100   Resp: 18 18 19    Temp: 98.1 F (36.7 C) 98.6 F (37 C) 98.6 F (37 C)   TempSrc: Oral Oral Oral   SpO2:  99% 99% 98%  Weight:      Height:        Intake/Output Summary (Last 24 hours) at 11/24/2020 1134 Last data filed at 11/24/2020 1030 Gross per 24 hour  Intake 4135.45 ml  Output 3850 ml  Net 285.45 ml   Filed Weights   11/22/20 1231  Weight: 64.4 kg    Examination:   General: Not in pain or dyspnea, deconditioned  Neurology: Awake and alert, non focal  E ENT: no pallor, no icterus, oral mucosa moist Cardiovascular: No JVD. S1-S2 present, rhythmic, no gallops, rubs, or murmurs. No lower extremity edema. Pulmonary: positive breath sounds bilaterally, with no wheezing or rhonchi scattered bilateral rales. Gastrointestinal. Abdomen soft and non tender Skin. Multiple skin lesions in the chest in the right arm   Musculoskeletal: no joint deformities          Data Reviewed: I have personally reviewed following labs and imaging studies  CBC: Recent Labs  Lab 11/22/20 1230 11/23/20 0431 11/24/20 0354  WBC 22.3* 15.0* 14.1*  NEUTROABS 20.3*  --  12.9*  HGB 8.2* 6.9* 7.7*  HCT 25.9* 22.2* 24.8*  MCV 99.6 99.6 99.2  PLT 108* 91* 70*   Basic Metabolic Panel: Recent Labs  Lab 11/22/20 1230 11/23/20 0431 11/24/20 0354  NA 129* 128* 132*  K 4.1 4.1 3.5  CL 95* 100 103  CO2 25 22 23   GLUCOSE 114* 97 97  BUN 16 12 10   CREATININE 0.96 0.38* 0.66  CALCIUM 7.6* 7.2* 7.0*   GFR: Estimated Creatinine Clearance: 69.8 mL/min (by C-G formula based on SCr of 0.66 mg/dL). Liver Function Tests: Recent Labs  Lab 11/22/20 1230 11/23/20 0431  AST 12* 10*  ALT 16 13  ALKPHOS 123 103  BILITOT 0.6 0.5  PROT 6.1* 5.6*  ALBUMIN 2.1* 1.8*   No results for input(s): LIPASE, AMYLASE in the last 168 hours. No results for input(s): AMMONIA in the last 168 hours. Coagulation Profile: Recent Labs  Lab 11/22/20 1230  INR  1.3*   Cardiac Enzymes: No results for input(s): CKTOTAL, CKMB, CKMBINDEX, TROPONINI in the last 168 hours. BNP (last 3 results) No results for input(s): PROBNP in the last 8760 hours. HbA1C: No results for input(s): HGBA1C in the last 72 hours. CBG: No results for input(s): GLUCAP in the last 168 hours. Lipid Profile: No results for input(s): CHOL, HDL, LDLCALC, TRIG, CHOLHDL, LDLDIRECT in the last 72 hours. Thyroid Function Tests: No results for input(s): TSH, T4TOTAL, FREET4, T3FREE, THYROIDAB in the last 72 hours. Anemia Panel: No results for input(s): VITAMINB12, FOLATE, FERRITIN, TIBC, IRON, RETICCTPCT in the last 72 hours.    Radiology Studies: I have reviewed all of the imaging during this hospital visit personally     Scheduled Meds:  sodium chloride   Intravenous Once   apixaban  5 mg Oral BID   buPROPion  150 mg Oral Daily   Chlorhexidine Gluconate Cloth  6 each Topical Daily   gabapentin  300 mg Oral TID   levothyroxine  75 mcg Oral QAC breakfast   lip balm       methadone  10 mg Oral Q8H   polyethylene glycol  17 g Oral Daily   sodium chloride flush  10-40 mL Intracatheter Q12H   Continuous Infusions:  sodium chloride 100 mL/hr at 11/24/20 0552   ceFEPime (MAXIPIME) IV 2 g (11/24/20 0553)   famotidine (PEPCID) IV 20 mg (11/23/20 1604)   vancomycin       LOS: 2 days        Justine Cossin Gerome Apley, MD

## 2020-11-24 NOTE — TOC Initial Note (Signed)
Transition of Care [facility]) - Initial/Assessment Note   Patient Details  Name: Kara Pittman MRN: [id number] Date of Birth: 23-Nov-1965  Transition of Care [facility]) CM/SW Contact:    Sherie Don, LCSW Phone Number: 11/24/2020, 3:30 PM  Clinical Narrative: Readmission checklist completed due to high readmission score. CSW met with patient to complete assessment. Per patient, she resides at home with her fiance. Patient does require some assistance with ADLs at times, which her fiance and mother assist with as needed. Patient is currently able afford her medications with her Medicaid. Patient has had her Medicaid for approximately 2 years after she was diagnosed with cancer as she was unable to return to work. Patient does not currently have or need any DME at this time. Several family members currently assist with transportation to medical appointments. TOC to follow for possible discharge needs.  Expected Discharge Plan: Home/Self Care Barriers to Discharge: Continued Medical Work up  Patient Goals and CMS Choice Patient states their goals for this hospitalization and ongoing recovery are:: Return home Choice offered to / list presented to : NA  Expected Discharge Plan and Services Expected Discharge Plan: Home/Self Care In-house Referral: Clinical Social Work Post Acute Care Choice: NA Living arrangements for the past 2 months: Single Family Home           DME Arranged: N/A DME Agency: NA  Prior Living Arrangements/Services Living arrangements for the past 2 months: Single Family Home Lives with:: Domestic Partner Patient language and need for interpreter reviewed:: Yes Do you feel safe going back to the place where you live?: Yes      Need for Family Participation in Patient Care: Yes (Comment) Care giver support system in place?: Yes (comment) Criminal Activity/Legal Involvement Pertinent to Current Situation/Hospitalization: No - Comment as needed  Activities of Daily Living Home  Assistive Devices/Equipment: Eyeglasses ADL Screening (condition at time of admission) Patient's cognitive ability adequate to safely complete daily activities?: Yes Is the patient deaf or have difficulty hearing?: No Does the patient have difficulty seeing, even when wearing glasses/contacts?: No Does the patient have difficulty concentrating, remembering, or making decisions?: No Patient able to express need for assistance with ADLs?: Yes Does the patient have difficulty dressing or bathing?: No Independently performs ADLs?: Yes (appropriate for developmental age) Does the patient have difficulty walking or climbing stairs?: No Weakness of Legs: None Weakness of Arms/Hands: None  Emotional Assessment Appearance:: Appears stated age Attitude/Demeanor/Rapport: Engaged Affect (typically observed): Accepting Orientation: : Oriented to Self, Oriented to Place, Oriented to  Time, Oriented to Situation Alcohol / Substance Use: Not Applicable Psych Involvement: No (comment)  Admission diagnosis:  Sepsis [facility]) [A41.9] Patient Active Problem List   Diagnosis Date Noted   Sepsis ([place]) 11/22/2020   Acute respiratory failure with hypoxia ([place]) 11/22/2020   Healthcare-associated pneumonia 11/22/2020   Pulmonary embolism ([place]) 10/01/2020   Hypokalemia 10/01/2020   Stage IV breast cancer in female [facility])    Open wound of right chest wall    Dehydration    Cancer related pain    Leukocytosis    Anemia of chronic disease    Debility    Sepsis due to gram-negative UTI ([place]) 09/27/2019   Cancer of overlapping sites of right female breast ([place]) 04/21/2019   Port-A-Cath in place 03/26/2019   Genetic testing 12/23/2018   Family history of leukemia    Family history of throat cancer    Family history of skin cancer    Family history of lung  cancer    Malignant neoplasm of lower-inner quadrant of right breast of female, estrogen receptor negative ([place]) 12/12/2018   Screening breast examination  12/04/2018   Breast mass, right 12/01/2018   Grief 05/06/2018   Menopausal symptom 07/04/2017   Colon cancer screening 07/04/2017   Encounter for preventative adult health care examination 07/04/2017   Family history of coronary arteriosclerosis 07/04/2017   Vitamin D deficiency 05/17/2016   Screening cholesterol level 05/17/2016   Personal history of malignant melanoma of skin 05/17/2016   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 [place] [place] [address] Phone: [phone] Fax: [phone]  [facility] [place] [address] Phone: [phone] Fax: [phone]  [id number] [address] [address]Phone: [phone]: [phone] Readmission Risk Interventions Readmission Risk Prevention Plan 11/24/2020  Transportation Screening Complete  Medication Review (RN Care Manager) Complete  SW Recovery Care/Counseling Consult Complete  Palliative Care Screening [place]  Some recent data might be hidden

## 2020-11-24 NOTE — Progress Notes (Signed)
   11/23/20 2033  Assess: MEWS Score  Temp (!) 100.9 F (38.3 C)  BP 106/72  Pulse Rate (!) 119  Resp 18  SpO2 100 %  O2 Device Nasal Cannula  O2 Flow Rate (L/min) 2 L/min  Assess: MEWS Score  MEWS Temp 1  MEWS Systolic 0  MEWS Pulse 2  MEWS RR 0  MEWS LOC 0  MEWS Score 3  MEWS Score Color Yellow  Assess: if the MEWS score is Yellow or Red  Were vital signs taken at a resting state? Yes  Focused Assessment No change from prior assessment  Does the patient meet 2 or more of the SIRS criteria? No  MEWS guidelines implemented *See Row Information* Yes  Treat  MEWS Interventions Administered prn meds/treatments  Take Vital Signs  Increase Vital Sign Frequency  Yellow: Q 2hr X 2 then Q 4hr X 2, if remains yellow, continue Q 4hrs  Notify: Charge Nurse/RN  Name of Charge Nurse/RN Notified Richelle RN  Date Charge Nurse/RN Notified 11/23/20  Time Charge Nurse/RN Notified 2033  Notify: Provider  Provider Name/Title Olena Heckle NP  Date Provider Notified 11/23/20  Time Provider Notified 2038  Notification Type Page  Notification Reason Change in status  Provider response See new orders  Date of Provider Response 11/23/20  Time of Provider Response 2042  Document  Patient Outcome Stabilized after interventions  Progress note created (see row info) Yes  Assess: SIRS CRITERIA  SIRS Temperature  0  SIRS Pulse 1  SIRS Respirations  0  SIRS WBC 0  SIRS Score Sum  1

## 2020-11-25 DIAGNOSIS — D638 Anemia in other chronic diseases classified elsewhere: Secondary | ICD-10-CM | POA: Diagnosis not present

## 2020-11-25 DIAGNOSIS — A419 Sepsis, unspecified organism: Secondary | ICD-10-CM | POA: Diagnosis not present

## 2020-11-25 DIAGNOSIS — J189 Pneumonia, unspecified organism: Secondary | ICD-10-CM | POA: Diagnosis not present

## 2020-11-25 DIAGNOSIS — J9601 Acute respiratory failure with hypoxia: Secondary | ICD-10-CM | POA: Diagnosis not present

## 2020-11-25 LAB — BASIC METABOLIC PANEL
Anion gap: 7 (ref 5–15)
BUN: 9 mg/dL (ref 6–20)
CO2: 21 mmol/L — ABNORMAL LOW (ref 22–32)
Calcium: 6.6 mg/dL — ABNORMAL LOW (ref 8.9–10.3)
Chloride: 101 mmol/L (ref 98–111)
Creatinine, Ser: 0.63 mg/dL (ref 0.44–1.00)
GFR, Estimated: >60 mL/min (ref >60)
Glucose, Bld: 114 mg/dL — ABNORMAL HIGH (ref 70–99)
Potassium: 3.5 mmol/L (ref 3.5–5.1)
Sodium: 129 mmol/L — ABNORMAL LOW (ref 135–145)

## 2020-11-25 LAB — CBC WITH DIFFERENTIAL/PLATELET
Abs Immature Granulocytes: 0.35 10*3/uL — ABNORMAL HIGH (ref 0.00–0.07)
Basophils Absolute: 0 10*3/uL (ref 0.0–0.1)
Basophils Relative: 0 %
Eosinophils Absolute: 0.2 10*3/uL (ref 0.0–0.5)
Eosinophils Relative: 1 %
HCT: 25.2 % — ABNORMAL LOW (ref 36.0–46.0)
Hemoglobin: 7.8 g/dL — ABNORMAL LOW (ref 12.0–15.0)
Immature Granulocytes: 2 %
Lymphocytes Relative: 2 %
Lymphs Abs: 0.3 10*3/uL — ABNORMAL LOW (ref 0.7–4.0)
MCH: 31 pg (ref 26.0–34.0)
MCHC: 31 g/dL (ref 30.0–36.0)
MCV: 100 fL (ref 80.0–100.0)
Monocytes Absolute: 0.4 10*3/uL (ref 0.1–1.0)
Monocytes Relative: 3 %
Neutro Abs: 15.2 10*3/uL — ABNORMAL HIGH (ref 1.7–7.7)
Neutrophils Relative %: 92 %
Platelets: 66 10*3/uL — ABNORMAL LOW (ref 150–400)
RBC: 2.52 MIL/uL — ABNORMAL LOW (ref 3.87–5.11)
RDW: 18.2 % — ABNORMAL HIGH (ref 11.5–15.5)
WBC: 16.6 10*3/uL — ABNORMAL HIGH (ref 4.0–10.5)
nRBC: 0 % (ref 0.0–0.2)

## 2020-11-25 MED ORDER — METHADONE HCL 10 MG PO TABS
15.0000 mg | ORAL_TABLET | Freq: Three times a day (TID) | ORAL | Status: DC
  Administered 2020-11-25 – 2020-11-27 (×7): 15 mg via ORAL
  Filled 2020-11-25 (×7): qty 2

## 2020-11-25 MED ORDER — ENSURE ENLIVE PO LIQD: 237.0000 mL | Freq: Two times a day (BID) | ORAL | Status: DC

## 2020-11-25 MED ORDER — ENSURE MAX PROTEIN PO LIQD
11.0000 [oz_av] | Freq: Two times a day (BID) | ORAL | Status: DC
  Administered 2020-11-25 – 2020-11-28 (×5): 11 [oz_av] via ORAL

## 2020-11-25 MED ORDER — METHOCARBAMOL 500 MG PO TABS
500.0000 mg | ORAL_TABLET | Freq: Three times a day (TID) | ORAL | Status: DC | PRN
  Administered 2020-11-25 – 2020-12-03 (×2): 500 mg via ORAL
  Filled 2020-11-25 (×2): qty 1

## 2020-11-25 MED ORDER — ADULT MULTIVITAMIN W/MINERALS CH
1.0000 | ORAL_TABLET | Freq: Every day | ORAL | Status: DC
  Administered 2020-11-25 – 2020-12-03 (×9): 1 via ORAL
  Filled 2020-11-25 (×9): qty 1

## 2020-11-25 NOTE — Progress Notes (Signed)
   11/25/20 1400  Mobility  Activity Refused mobility   Pt states she is tired and would like to rest. Refused mobility. Will check with her tomorrow as schedule permits.    [place] Specialist Acute Rehab Services Office: [phone]

## 2020-11-25 NOTE — Progress Notes (Signed)
PROGRESS NOTE    Kara Pittman  JHE:[id number] DOB: 02-17-1965 DOA: 11/22/2020 PCP: [name], MD    Brief Narrative:  Kara Pittman was admitted to the hospital with the working diagnosis of sepsis due to post obstructive pneumonia/ infected left chest wound.    55 year old female past medical history for metastatic breast cancer, hypothyroidism, chronic pain syndrome, GERD and depression who presented not feeling well.  Patient reported several days of coughing, patient had fever, nausea and weakness.  Recent hospitalization at [facility] about 18 days ago for similar presentation, discharged 10/14 on analgesic regimen.  Apparently infection was ruled out, fevers are presumed to be related to malignancy (large tumor burden).  She did not received any antibiotic therapy.   Her temperature on admission 98.7, heart rate 123, blood pressure 98/73, oxygen saturation 85% on room air, her lungs are clear to auscultation bilaterally, heart S1-S2, present, rhythmic, her abdomen was distended, no lower extremity edema.   Sodium 129, potassium 4.1, chloride 95, bicarb 25, glucose 114, BUN 16, creatinine 0.96, white count 22.3, hemoglobin 8.2, hematocrit 25.9, platelets 108. SARS COVID-19 negative.   Urinalysis specific gravity 1.012, 0 having 5 white cells.   Chest radiograph with large round opacities right lower lobe and left upper lobe.   EKG 120 bpm, normal axis, normal intervals, sinus rhythm, no significant ST segment or T wave changes, low voltage.   Assessment & Plan:   Principal Problem:   Sepsis ([place]) Active Problems:   Anemia of chronic disease   Acute respiratory failure with hypoxia (HCC)   Healthcare-associated pneumonia   Sepsis due to post obstructive pneumonia.  Patient has been afebrile, but wbc is up to 16.  Blood cultures continue with no growth.  Her blood pressure has improved with 101/74 mmHg this am.  Patient has a tender spot on her right hip region, worse to  touch. .   Patient has a radiation induced skin lesion on her left/ right breast, with ulcerated wound on the right, with positive secretion, that can not ruled out to be infected.    Plan to continue with broad spectrum antibiotic therapy with vancomycin and cefepime. Continue to follow up on temperature curve and cell count.     2. Hyponatremia, hypovolemic  Renal function with serum cr at 0,63 and bicarbonate at 21.  Her volume status has improved, Na is 129. Patient has history of [place].  Plan to discontinue IV fluids, liberate diet and add nutritional supplements. Follow up renal function and electrolytes in am.    3. 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  She had a right mastectomy May 2021 followed by multiple rounds of chemotherapy and immunotherapy.  Unfortunately developed widespread metastasis to the chest wall, lungs, liver, adrenals and bone.  Severe pain right chest, axilla. In August 2022 she was diagnosed with pulmonary embolism.  Sp 1 units PRBC transfusion.    Follow up hgb [place] 7,8 and hct 25,2. Plt 66 Patient continue to have uncontrolled pain worse at her right hip. Plan to increase methadone from 10 mg tid to 15 mg tid, continue break through pain control with hydromorphone po and IV.  On gabapentin 300 mg po tid.    4. Hypothyroid. Continue with  levothyroxine   5. Depression.  Continue with bupropion    6. Pulmonary embolism.  On apixaban for anticoagulation with good toleration   Patient continue to be at high risk  for worsening sepsis   Status is: Inpatient  Remains inpatient appropriate because: IV antibiotic therapy    DVT prophylaxis: apixaban  Code Status:    full  Family Communication:  I spoke with patient's mother at the bedside, we talked in detail about patient's condition, plan of care and prognosis and all questions were  addressed.    Antimicrobials:  Vancomycin and cefepime     Subjective: Patient continue to have significant pain on her right hip, worse with movement and touch, no nausea or vomiting,   Objective: Vitals:   11/24/20 2225 11/25/20 0230 11/25/20 0628 11/25/20 0921  BP: 108/67 107/68 106/70 101/74  Pulse: (!) 119 (!) 118 (!) 117 (!) 105  Resp: 18 18 18 18   Temp: 100.1 F (37.8 C) 98.7 F (37.1 C) 100.1 F (37.8 C) 98.4 F (36.9 C)  TempSrc: Oral Oral Oral   SpO2: 97% 98% 98% 100%  Weight:      Height:        Intake/Output Summary (Last 24 hours) at 11/25/2020 1016 Last data filed at 11/25/2020 0641 Gross per 24 hour  Intake 2275.67 ml  Output 2200 ml  Net 75.67 ml   Filed Weights   11/22/20 1231  Weight: 64.4 kg    Examination:   General: deconditioned and ill looking appearing  Neurology: Awake and alert, non focal  E ENT: mild pallor, no icterus, oral mucosa moist Cardiovascular: No JVD. S1-S2 present, rhythmic, no gallops, rubs, or murmurs. No lower extremity edema. Pulmonary: vesicular breath sounds bilaterally, adequate air movement, no wheezing, rhonchi or rales. Gastrointestinal. Abdomen soft and non tender Skin. Radiation skin injury in her chest, positive wound on right chest and right arm dressing in place.  Musculoskeletal: no joint deformities     Data Reviewed: I have personally reviewed following labs and imaging studies  CBC: Recent Labs  Lab 11/22/20 1230 11/23/20 0431 11/24/20 0354 11/25/20 0447  WBC 22.3* 15.0* 14.1* 16.6*  NEUTROABS 20.3*  --  12.9* 15.2*  HGB 8.2* 6.9* 7.7* 7.8*  HCT 25.9* 22.2* 24.8* 25.2*  MCV 99.6 99.6 99.2 100.0  PLT 108* 91* 70* 66*   Basic Metabolic Panel: Recent Labs  Lab 11/22/20 1230 11/23/20 0431 11/24/20 0354 11/25/20 0447  NA 129* 128* 132* 129*  K 4.1 4.1 3.5 3.5  CL 95* 100 103 101  CO2 25 22 23  21*  GLUCOSE 114* 97 97 114*  BUN 16 12 10 9   CREATININE 0.96 0.38* 0.66 0.63  CALCIUM 7.6*  7.2* 7.0* 6.6*   GFR: Estimated Creatinine Clearance: 69.8 mL/min (by C-G formula based on SCr of 0.63 mg/dL). Liver Function Tests: Recent Labs  Lab 11/22/20 1230 11/23/20 0431  AST 12* 10*  ALT 16 13  ALKPHOS 123 103  BILITOT 0.6 0.5  PROT 6.1* 5.6*  ALBUMIN 2.1* 1.8*   No results for input(s): LIPASE, AMYLASE in the last 168 hours. No results for input(s): AMMONIA in the last 168 hours. Coagulation Profile: Recent Labs  Lab 11/22/20 1230  INR 1.3*   Cardiac Enzymes: No results for input(s): CKTOTAL, CKMB, CKMBINDEX, TROPONINI in the last 168 hours. BNP (last 3 results) No results for input(s): PROBNP in the last 8760 hours. HbA1C: No results for input(s): HGBA1C in the last 72 hours. CBG: No results for input(s): GLUCAP in the last 168 hours. Lipid Profile: No results for input(s): CHOL, HDL, LDLCALC, TRIG, CHOLHDL, LDLDIRECT in the last 72 hours. Thyroid Function Tests: No results for input(s): TSH, T4TOTAL, FREET4, T3FREE,  THYROIDAB in the last 72 hours. Anemia Panel: No results for input(s): VITAMINB12, FOLATE, FERRITIN, TIBC, IRON, RETICCTPCT in the last 72 hours.    Radiology Studies: I have reviewed all of the imaging during this hospital visit personally     Scheduled Meds:  sodium chloride   Intravenous Once   apixaban  5 mg Oral BID   buPROPion  150 mg Oral Daily   Chlorhexidine Gluconate Cloth  6 each Topical Daily   gabapentin  300 mg Oral TID   levothyroxine  75 mcg Oral QAC breakfast   methadone  10 mg Oral Q8H   polyethylene glycol  17 g Oral Daily   sodium chloride flush  10-40 mL Intracatheter Q12H   Continuous Infusions:  sodium chloride 100 mL/hr at 11/25/20 0634   ceFEPime (MAXIPIME) IV 2 g (11/25/20 0434)   famotidine (PEPCID) IV Stopped (11/24/20 1713)   vancomycin 150 mL/hr at 11/24/20 1803     LOS: 3 days        Kersten Salmons Gerome Apley, MD

## 2020-11-25 NOTE — Progress Notes (Signed)
Initial Nutrition Assessment  INTERVENTION:   -Ensure MAX Protein po BID, each supplement provides 150 kcal and 30 grams of protein   -Multivitamin with minerals daily  -D/c Ensure Enlive  NUTRITION DIAGNOSIS:   Increased nutrient needs related to cancer and cancer related treatments as evidenced by estimated needs.  GOAL:   Patient will meet greater than or equal to 90% of their needs  MONITOR:   PO intake, Supplement acceptance, Labs, Weight trends, I & O's, Skin  REASON FOR ASSESSMENT:   Consult Assessment of nutrition requirement/status  ASSESSMENT:   55 year old female past medical history for metastatic breast cancer, hypothyroidism, chronic pain syndrome, GERD and depression who presented not feeling well.  Patient reported several days of coughing, patient had fever, nausea and weakness. Admitted to the hospital with the working diagnosis of sepsis due to post obstructive pneumonia/ infected left chest wound.  Patient sitting in chair, family at bedside. Pt reports she has been eating better lately and her family has been very supportive and providing foods that she enjoys. Appetite and intakes fluctuate based on pain levels.   Currently she is snacking on some fresh fruit. States she drinks at least 1 Fairlife protein supplement a day (has 30g protein per bottle). Is willing to try our equivalent Ensure Max.  Pt denies recent weight loss and thinks her weight has been stable.  Per weight records, pt has lost 5 lbs since January 2022, insignificant for time frame.  Medications: Miralax  Labs reviewed:   Low Na  NUTRITION - FOCUSED PHYSICAL EXAM:  Flowsheet Row Most Recent Value  Orbital Region No depletion  Upper Arm Region Unable to assess  Thoracic and Lumbar Region Unable to assess  Buccal Region No depletion  Temple Region No depletion  Clavicle Bone Region Unable to assess  [radiation burn]  Clavicle and Acromion Bone Region Unable to assess  Scapular  Bone Region Unable to assess  Dorsal Hand Mild depletion  Patellar Region Unable to assess  Anterior Thigh Region Unable to assess  Posterior Calf Region Unable to assess  Edema (RD Assessment) None  Hair Reviewed  [covered d/t hair loss]  Eyes Reviewed  Mouth Reviewed  Skin Unable to assess       Diet Order:   Diet Order             Diet regular Room service appropriate? Yes; Fluid consistency: Thin  Diet effective now                   EDUCATION NEEDS:   Education needs have been addressed  Skin:  Skin Assessment: Skin Integrity Issues: Skin Integrity Issues:: Other (Comment) Other: rt hip blister  Last BM:  10/19  Height:   Ht Readings from Last 1 Encounters:  11/22/20 5\' 5"  (1.651 m)    Weight:   Wt Readings from Last 1 Encounters:  11/22/20 64.4 kg    BMI:  Body mass index is 23.63 kg/m.  Estimated Nutritional Needs:   Kcal:  1900-2100  Protein:  95-105g  Fluid:  2L/day  Clayton Bibles, MS, RD, LDN Inpatient Clinical Dietitian Contact information available via Amion

## 2020-11-26 DIAGNOSIS — J189 Pneumonia, unspecified organism: Secondary | ICD-10-CM | POA: Diagnosis present

## 2020-11-26 DIAGNOSIS — D638 Anemia in other chronic diseases classified elsewhere: Secondary | ICD-10-CM | POA: Diagnosis not present

## 2020-11-26 DIAGNOSIS — C50919 Malignant neoplasm of unspecified site of unspecified female breast: Secondary | ICD-10-CM

## 2020-11-26 DIAGNOSIS — S21101D Unspecified open wound of right front wall of thorax without penetration into thoracic cavity, subsequent encounter: Secondary | ICD-10-CM

## 2020-11-26 DIAGNOSIS — I2699 Other pulmonary embolism without acute cor pulmonale: Secondary | ICD-10-CM | POA: Diagnosis not present

## 2020-11-26 DIAGNOSIS — E039 Hypothyroidism, unspecified: Secondary | ICD-10-CM | POA: Diagnosis present

## 2020-11-26 LAB — BASIC METABOLIC PANEL
Anion gap: 10 (ref 5–15)
BUN: 9 mg/dL (ref 6–20)
CO2: 20 mmol/L — ABNORMAL LOW (ref 22–32)
Calcium: 6.9 mg/dL — ABNORMAL LOW (ref 8.9–10.3)
Chloride: 97 mmol/L — ABNORMAL LOW (ref 98–111)
Creatinine, Ser: 0.66 mg/dL (ref 0.44–1.00)
GFR, Estimated: >60 mL/min (ref >60)
Glucose, Bld: 90 mg/dL (ref 70–99)
Potassium: 3.3 mmol/L — ABNORMAL LOW (ref 3.5–5.1)
Sodium: 127 mmol/L — ABNORMAL LOW (ref 135–145)

## 2020-11-26 LAB — CBC
HCT: 24.5 % — ABNORMAL LOW (ref 36.0–46.0)
HCT: 24.6 % — ABNORMAL LOW (ref 36.0–46.0)
Hemoglobin: 7.5 g/dL — ABNORMAL LOW (ref 12.0–15.0)
Hemoglobin: 7.7 g/dL — ABNORMAL LOW (ref 12.0–15.0)
MCH: 30.9 pg (ref 26.0–34.0)
MCH: 31 pg (ref 26.0–34.0)
MCHC: 30.6 g/dL (ref 30.0–36.0)
MCHC: 31.3 g/dL (ref 30.0–36.0)
MCV: 100.8 fL — ABNORMAL HIGH (ref 80.0–100.0)
MCV: 99.2 fL (ref 80.0–100.0)
Platelets: 65 10*3/uL — ABNORMAL LOW (ref 150–400)
Platelets: 66 10*3/uL — ABNORMAL LOW (ref 150–400)
RBC: 2.43 MIL/uL — ABNORMAL LOW (ref 3.87–5.11)
RBC: 2.48 MIL/uL — ABNORMAL LOW (ref 3.87–5.11)
RDW: 18.1 % — ABNORMAL HIGH (ref 11.5–15.5)
RDW: 18.2 % — ABNORMAL HIGH (ref 11.5–15.5)
WBC: 18.7 10*3/uL — ABNORMAL HIGH (ref 4.0–10.5)
WBC: 19.2 10*3/uL — ABNORMAL HIGH (ref 4.0–10.5)
nRBC: 0 % (ref 0.0–0.2)
nRBC: 0 % (ref 0.0–0.2)

## 2020-11-26 MED ORDER — FLUCONAZOLE IN SODIUM CHLORIDE 400-0.9 MG/200ML-% IV SOLN
400.0000 mg | Freq: Every day | INTRAVENOUS | Status: DC
  Administered 2020-11-27 – 2020-11-28 (×2): 400 mg via INTRAVENOUS
  Filled 2020-11-26 (×2): qty 200

## 2020-11-26 MED ORDER — SODIUM CHLORIDE 1 G PO TABS
1.0000 g | ORAL_TABLET | Freq: Three times a day (TID) | ORAL | Status: DC
  Administered 2020-11-26 – 2020-11-29 (×9): 1 g via ORAL
  Filled 2020-11-26 (×11): qty 1

## 2020-11-26 MED ORDER — IBUPROFEN 400 MG PO TABS
400.0000 mg | ORAL_TABLET | ORAL | Status: DC | PRN
  Administered 2020-11-26: 400 mg via ORAL
  Filled 2020-11-26: qty 1

## 2020-11-26 MED ORDER — SODIUM CHLORIDE 0.9 % IV BOLUS
500.0000 mL | Freq: Once | INTRAVENOUS | Status: AC
  Administered 2020-11-26: 500 mL via INTRAVENOUS

## 2020-11-26 MED ORDER — ACETAMINOPHEN 325 MG PO TABS
650.0000 mg | ORAL_TABLET | Freq: Four times a day (QID) | ORAL | Status: DC | PRN
  Administered 2020-11-26 – 2020-12-02 (×6): 650 mg via ORAL
  Filled 2020-11-26 (×6): qty 2

## 2020-11-26 MED ORDER — FLUCONAZOLE IN SODIUM CHLORIDE 400-0.9 MG/200ML-% IV SOLN
800.0000 mg | Freq: Once | INTRAVENOUS | Status: AC
  Administered 2020-11-27: 800 mg via INTRAVENOUS
  Filled 2020-11-26: qty 400

## 2020-11-26 MED ORDER — SODIUM CHLORIDE 0.9 % IV BOLUS
1000.0000 mL | Freq: Once | INTRAVENOUS | Status: DC
Start: 2020-11-26 — End: 2020-11-28

## 2020-11-26 MED ORDER — SODIUM CHLORIDE 0.9 % IV SOLN
12.5000 mg | Freq: Four times a day (QID) | INTRAVENOUS | Status: DC | PRN
  Administered 2020-11-26: 12.5 mg via INTRAVENOUS
  Filled 2020-11-26: qty 12.5

## 2020-11-26 NOTE — Evaluation (Signed)
Physical Therapy Evaluation Patient Details Name: Kara Pittman MRN: [id number] DOB: [birth year] Today's Date: 11/26/2020  History of Present Illness  55 yo female presenting with cough, fever, nausea, and weakness. Recent admission to [facility] for 18 days (dc 10/14). PMH including metastatic breast cancer, hypothyroidism, chronic pain syndrome, GERD and depression.  Clinical Impression  Pt admitted as above and presenting with functional mobility limitations 2* decreased endurance and ambulatory balance deficits placing pt at increased risk of falling.  This date, pt up to ambulate in hall, initially sans assistive device but transitioned to use of RW with marked improvement in stability with pt reporting feeling steadier and safer.  Pt would benefit from use of RW for home.  No HHPT at this time, pt and fiance feel then can work on strength and mobility together but will reach out if needs change.     Recommendations for follow up therapy are one component of a multi-disciplinary discharge planning process, led by the attending physician.  Recommendations may be updated based on patient status, additional functional criteria and insurance authorization.  Follow Up Recommendations No PT follow up    Equipment Recommendations  Rolling walker with 5" wheels    Recommendations for Other Services       Precautions / Restrictions Precautions Precautions: Fall Restrictions Weight Bearing Restrictions: No      Mobility  Bed Mobility Overal bed mobility: Modified Independent             General bed mobility comments: Increased time but np physical assist    Transfers Overall transfer level: Modified independent               General transfer comment: increase time  Ambulation/Gait Ambulation/Gait assistance: Min assist;Min guard;Supervision Gait Distance (Feet): 300 Feet Assistive device: None;Rolling walker (2 wheeled) Gait Pattern/deviations: Step-through  pattern;Decreased step length - right;Decreased step length - left;Shuffle;Trunk flexed Gait velocity: decr   General Gait Details: Pt initially ambualitng sans AD but with noted instability and propensity to reach for places to stabilize.  Marked improvement in stability with transition to RW and pt admits she feels steadier and safer.  Stairs            Wheelchair Mobility    Modified Rankin (Stroke Patients Only)       Balance Overall balance assessment: Needs assistance Sitting-balance support: No upper extremity supported;Feet supported Sitting balance-Leahy Scale: Good     Standing balance support: No upper extremity supported Standing balance-Leahy Scale: Fair                               Pertinent Vitals/Pain Pain Assessment: 0-10 Pain Score: 7  Faces Pain Scale: Hurts little more Pain Location: R hip Pain Descriptors / Indicators: Discomfort;Grimacing Pain Intervention(s): Limited activity within patient's tolerance;Monitored during session;Patient requesting pain meds-RN notified    Home Living Family/patient expects to be discharged to:: Private residence Living Arrangements: Spouse/significant other Available Help at Discharge: Family Type of Home: House Home Access: Stairs to enter   [employer] of Steps: 1 onto porch then 1 into home Home Layout: Two level;Able to live on main level with bedroom/bathroom Home Equipment: None      Prior Function Level of Independence: Independent         Comments: ADLs. Mother and fiance assist with IADLs. Mother drives her to [facility] for appointments     Hand Dominance   Dominant Hand: Right  Extremity/Trunk Assessment   Upper Extremity Assessment Upper Extremity Assessment: Defer to OT evaluation    Lower Extremity Assessment Lower Extremity Assessment: Overall WFL for tasks assessed    Cervical / Trunk Assessment Cervical / Trunk Assessment: Normal  Communication    Communication: No difficulties  Cognition Arousal/Alertness: Awake/alert Behavior During Therapy: WFL for tasks assessed/performed Overall Cognitive Status: Within Functional Limits for tasks assessed                                 General Comments: Very agreeable to therapy      General Comments General comments (skin integrity, edema, etc.): Fiance present and supportive during session    Exercises     Assessment/Plan    PT Assessment Patient needs continued PT services  PT Problem List Decreased activity tolerance;Decreased balance;Pain       PT Treatment Interventions DME instruction;Gait training;Stair training;Functional mobility training;Therapeutic activities;Therapeutic exercise;Patient/family education;Balance training    PT Goals (Current goals can be found in the Care Plan section)  Acute Rehab PT Goals Patient Stated Goal: Go home PT Goal Formulation: With patient Time For Goal Achievement: 12/09/20 Potential to Achieve Goals: Good    Frequency Min 3X/week   Barriers to discharge        Co-evaluation               AM-PAC PT "6 Clicks" Mobility  Outcome Measure Help needed turning from your back to your side while in a flat bed without using bedrails?: None Help needed moving from lying on your back to sitting on the side of a flat bed without using bedrails?: None Help needed moving to and from a bed to a chair (including a wheelchair)?: A Little Help needed standing up from a chair using your arms (e.g., wheelchair or bedside chair)?: A Little Help needed to walk in hospital room?: A Little Help needed climbing 3-5 steps with a railing? : A Little 6 Click Score: 20    End of Session   Activity Tolerance: Patient tolerated treatment well;Patient limited by fatigue Patient left: in bed;with call bell/phone within reach;with family/visitor present;with bed alarm set Nurse Communication: Mobility status PT Visit Diagnosis:  Difficulty in walking, not elsewhere classified (R26.2)    Time: 6144-3154 PT Time Calculation (min) (ACUTE ONLY): 27 min   Charges:   PT Evaluation $PT Eval Low Complexity: 1 Low PT Treatments $Gait Training: 8-22 mins        [place] Pager [phone] Office [phone]   Chiffon Kittleson 11/26/2020, 4:43 PM

## 2020-11-26 NOTE — Evaluation (Signed)
Occupational Therapy Evaluation Patient Details Name: Kara Pittman MRN: [id number] DOB: [birth year] Today's Date: 11/26/2020   History of Present Illness 55 yo female presenting with cough, fever, nausea, and weakness. Recent admission to [facility] for 18 days (dc 10/14). PMH including metastatic breast cancer, hypothyroidism, chronic pain syndrome, GERD and depression.   Clinical Impression   PTA, pt was living with her fiance and was performing BADLs; mother and fiance assisting with IADLs. Pt performing ADLs and functional mobility at Mod I level. Pt reporting recently she has been very fatigued. Pt reporting how her and her mother have a system to assist with energy conversation. Pt verbalizing comfort with ADLs and dc to home.  Recommend dc to home once medically stable per physician. All acute OT needs met and will sign off.    Recommendations for follow up therapy are one component of a multi-disciplinary discharge planning process, led by the attending physician.  Recommendations may be updated based on patient status, additional functional criteria and insurance authorization.   Follow Up Recommendations  No OT follow up    Equipment Recommendations  None recommended by OT    Recommendations for Other Services       Precautions / Restrictions Precautions Precautions: Fall      Mobility Bed Mobility Overal bed mobility: Modified Independent                  Transfers Overall transfer level: Modified independent               General transfer comment: increase time    Balance Overall balance assessment: No apparent balance deficits (not formally assessed)                                         ADL either performed or assessed with clinical judgement   ADL Overall ADL's : Modified independent                                       General ADL Comments: Pt performing ADLs and functional mobiltiy at Mod I level with  increased time.     Vision Baseline Vision/History: 1 Wears glasses (readers) Patient Visual Report: No change from baseline       Perception     Praxis      Pertinent Vitals/Pain Pain Assessment: Faces Faces Pain Scale: Hurts little more Pain Location: R hip Pain Descriptors / Indicators: Discomfort;Grimacing Pain Intervention(s): Monitored during session;Limited activity within patient's tolerance;Repositioned     Hand Dominance Right   Extremity/Trunk Assessment Upper Extremity Assessment Upper Extremity Assessment: Overall WFL for tasks assessed   Lower Extremity Assessment Lower Extremity Assessment: Defer to PT evaluation   Cervical / Trunk Assessment Cervical / Trunk Assessment: Normal   Communication Communication Communication: No difficulties   Cognition Arousal/Alertness: Awake/alert Behavior During Therapy: WFL for tasks assessed/performed Overall Cognitive Status: Within Functional Limits for tasks assessed                                 General Comments: Very agreeable to therapy   General Comments  Mother present throughout    Exercises     Shoulder Instructions      Home Living Family/patient expects to be discharged to:: Private  residence Living Arrangements: Spouse/significant other Available Help at Discharge: Family Type of Home: House (Condo) Home Access: Stairs to enter [employer] of Steps: 1 onto porch then 1 into home   Home Layout: Two level Alternate Level Stairs-Number of Steps: flight Alternate Level Stairs-Rails: Right;Left Bathroom Shower/Tub: Walk-in shower;Tub/shower unit   Bathroom Toilet: Standard     Home Equipment: None          Prior Functioning/Environment Level of Independence: Independent        Comments: ADLs. Mother and fiance assist with IADLs. Mother drives her to [facility] for appointments        OT Problem List: Decreased strength;Decreased activity tolerance;Decreased  range of motion;Decreased knowledge of use of DME or AE;Decreased knowledge of precautions;Pain      OT Treatment/Interventions:      OT Goals(Current goals can be found in the care plan section) Acute Rehab OT Goals Patient Stated Goal: Go home OT Goal Formulation: All assessment and education complete, DC therapy  OT Frequency:     Barriers to D/C:            Co-evaluation              AM-PAC OT "6 Clicks" Daily Activity     Outcome Measure Help from another person eating meals?: None Help from another person taking care of personal grooming?: None Help from another person toileting, which includes using toliet, bedpan, or urinal?: None Help from another person bathing (including washing, rinsing, drying)?: None Help from another person to put on and taking off regular upper body clothing?: None Help from another person to put on and taking off regular lower body clothing?: None 6 Click Score: 24   End of Session Nurse Communication: Mobility status  Activity Tolerance: Patient tolerated treatment well Patient left: in chair;with call bell/phone within reach  OT Visit Diagnosis: Unsteadiness on feet (R26.81);Other abnormalities of gait and mobility (R26.89);Muscle weakness (generalized) (M62.81)                Time: 5997-7414 OT Time Calculation (min): 22 min Charges:  OT General Charges $OT Visit: 1 Visit OT Evaluation $OT Eval Low Complexity: 1 Low  Sharifa Bucholz MSOT, OTR/L Acute Rehab Pager: [phone] Office: [place] 11/26/2020, 3:44 PM

## 2020-11-26 NOTE — Progress Notes (Signed)
Pt w temp spike up to 102.4, pulse tachy at 127, Bps remain soft in 37'S systolic. MD notified and orders received. Will cont to monitor.

## 2020-11-26 NOTE — Progress Notes (Signed)
Patient flagged [place] d/t blood pressure of 100/58 and heart rate of 131. No distress noted. In bed resting. House coverage notified, no new orders at this time. Rapid response notified.

## 2020-11-26 NOTE — Progress Notes (Addendum)
PROGRESS NOTE    KONNOR JORDEN  OQH:[id number] DOB: 1966/01/28 DOA: 11/22/2020 PCP: [name], MD    Brief Narrative:  Mrs. Pogue was admitted to the hospital with the working diagnosis of sepsis due to post obstructive pneumonia/ infected right chest wound.    55 year old female past medical history for metastatic breast cancer, hypothyroidism, chronic pain syndrome, GERD and depression who presented not feeling well.  Patient reported several days of coughing, patient had fever, nausea and weakness.  Recent hospitalization at [facility] about 18 days ago for similar presentation, discharged 10/14 on analgesic regimen.  Apparently infection was ruled out, fevers were presumed to be related to malignancy (large tumor burden).  She did not received any antibiotic therapy.   Her temperature on admission 98.7, heart rate 123, blood pressure 98/73, oxygen saturation 85% on room air, her lungs are clear to auscultation bilaterally, heart S1-S2, present, rhythmic, her abdomen was distended, no lower extremity edema.   Sodium 129, potassium 4.1, chloride 95, bicarb 25, glucose 114, BUN 16, creatinine 0.96, white count 22.3, hemoglobin 8.2, hematocrit 25.9, platelets 108. SARS COVID-19 negative.   Urinalysis specific gravity 1.012, 0 having 5 white cells.   Chest radiograph with large round opacities right lower lobe and left upper lobe.   EKG 120 bpm, normal axis, normal intervals, sinus rhythm, no significant ST segment or T wave changes, low voltage.  Patient placed on broad spectrum antibiotic therapy and supportive medical care. Improvement of fever but continue to have severe and persistent pain at her right hip region.    Assessment & Plan:   Principal Problem:   Pneumonia (post obstructive pneumonia due to pulmonary metastasis) Active Problems:   Stage IV breast cancer in female [facility])   Open wound of right chest wall   Anemia of chronic disease   Pulmonary embolism (HCC)    Sepsis (HCC)   Hypothyroid     Sepsis due to post obstructive pneumonia with acute hypoxemic respiratory failure.   Patient has a radiation induced skin lesion on her left/ right breast, with ulcerated wound on the right, with positive secretion, that can not ruled out to be infected.   Last fever spike at midnight, patient with nausea this am but improved pain with higher dose of methadone.  Her wbc continue to be elevated at 18,7  Blood pressure has been low systolic high 54'S  Patient very weak and deconditioned, large tumor burden Continue with broad spectrum antibiotic therapy with vancomycin and cefepime. Follow cell count and temperature curve.  Continue local wound care to chest wounds.       2. Hyponatremia, hypokalemia.  History of SAIDH. Clinically patient is euvolemic.    Now patient is off IV fluids and is tolerating po well.  Add sodium tablets tid and follow renal panel in am, continue to hold on IV fluids.    3. 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  She had a right mastectomy May 2021 followed by multiple rounds of chemotherapy and immunotherapy.  Unfortunately developed widespread metastasis to the chest wall, lungs, liver, adrenals and bone.  Severe pain right chest, axilla. In August 2022 she was diagnosed with pulmonary embolism.   Sp 1 units PRBC transfusion.   Today her pain has improved.  Plan to continue with methadone 15 mg tid, continue break through pain control with hydromorphone po 8 mg. (Last time she used IV hydromorphone was on 10/20)  Continue with gabapentin 300 mg po tid.    4. Hypothyroid.  On levothyroxine   5. Depression.  On bupropion    6. Pulmonary embolism.  Continue with apixaban for anticoagulation with good toleration   Patient continue to be at high risk for worsening sepsis   Status is: Inpatient  Remains  inpatient appropriate because: IV antibiotic therapy.    DVT prophylaxis: Apixaban   Code Status:    full  Family Communication:    I was not able to reach her mother or Mr. Cox over the phone I left a message.    Nutrition Status: Nutrition Problem: Increased nutrient needs Etiology: cancer and cancer related treatments Signs/Symptoms: estimated needs Interventions: MVI, Premier Protein   Antimicrobials:  Vancomycin and cefepime     Subjective: Patient with nausea this am, continue to be very weak and deconditioned, positive fever last night. Her pain has improved.   Objective: Vitals:   11/26/20 0534 11/26/20 0636 11/26/20 0800 11/26/20 0918  BP: 94/64 92/66 96/71  91/68  Pulse: (!) 105 (!) 102 94 91  Resp: 18 18 18 16   Temp: 98.3 F (36.8 C) 98.4 F (36.9 C) 97.8 F (36.6 C) (!) 97.3 F (36.3 C)  TempSrc: Oral Oral Oral Oral  SpO2: 95% 96% 96% 99%  Weight:      Height:        Intake/Output Summary (Last 24 hours) at 11/26/2020 1056 Last data filed at 11/26/2020 9381 Gross per 24 hour  Intake 1912.91 ml  Output 2825 ml  Net -912.09 ml   Filed Weights   11/22/20 1231  Weight: 64.4 kg    Examination:   General: deconditioned and ill looking appearing Neurology: Awake, very mild somnolence  E ENT: positive pallor, no icterus, oral mucosa moist Cardiovascular: No JVD. S1-S2 present, rhythmic, no gallops, rubs, or murmurs. No lower extremity edema. Pulmonary: positive breath sounds bilaterally, adequate air movement, no wheezing, rhonchi or rales. Gastrointestinal. Abdomen soft and non tender Skin. Right chest wound and bilateral chest radiation skin injury, right upper extremity and left breast ulcers.  Lower extremities ecchymosis.  Musculoskeletal: no joint deformities     Data Reviewed: I have personally reviewed following labs and imaging studies  CBC: Recent Labs  Lab 11/22/20 1230 11/23/20 0431 11/24/20 0354 11/25/20 0447 11/26/20 0218   WBC 22.3* 15.0* 14.1* 16.6* 18.7*  NEUTROABS 20.3*  --  12.9* 15.2*  --   HGB 8.2* 6.9* 7.7* 7.8* 7.5*  HCT 25.9* 22.2* 24.8* 25.2* 24.5*  MCV 99.6 99.6 99.2 100.0 100.8*  PLT 108* 91* 70* 66* 65*   Basic Metabolic Panel: Recent Labs  Lab 11/22/20 1230 11/23/20 0431 11/24/20 0354 11/25/20 0447 11/26/20 0218  NA 129* 128* 132* 129* 127*  K 4.1 4.1 3.5 3.5 3.3*  CL 95* 100 103 101 97*  CO2 25 22 23  21* 20*  GLUCOSE 114* 97 97 114* 90  BUN 16 12 10 9 9   CREATININE 0.96 0.38* 0.66 0.63 0.66  CALCIUM 7.6* 7.2* 7.0* 6.6* 6.9*   GFR: Estimated Creatinine Clearance: 69.8 mL/min (by C-G formula based on SCr of 0.66 mg/dL). Liver Function Tests: Recent Labs  Lab 11/22/20 1230 11/23/20 0431  AST 12* 10*  ALT 16 13  ALKPHOS 123 103  BILITOT 0.6 0.5  PROT 6.1* 5.6*  ALBUMIN 2.1* 1.8*   No results for input(s): LIPASE, AMYLASE in the last 168 hours. No results for input(s): AMMONIA in the last 168 hours. Coagulation Profile: Recent Labs  Lab 11/22/20  1230  INR 1.3*   Cardiac Enzymes: No results for input(s): CKTOTAL, CKMB, CKMBINDEX, TROPONINI in the last 168 hours. BNP (last 3 results) No results for input(s): PROBNP in the last 8760 hours. HbA1C: No results for input(s): HGBA1C in the last 72 hours. CBG: No results for input(s): GLUCAP in the last 168 hours. Lipid Profile: No results for input(s): CHOL, HDL, LDLCALC, TRIG, CHOLHDL, LDLDIRECT in the last 72 hours. Thyroid Function Tests: No results for input(s): TSH, T4TOTAL, FREET4, T3FREE, THYROIDAB in the last 72 hours. Anemia Panel: No results for input(s): VITAMINB12, FOLATE, FERRITIN, TIBC, IRON, RETICCTPCT in the last 72 hours.    Radiology Studies: I have reviewed all of the imaging during this hospital visit personally     Scheduled Meds:  sodium chloride   Intravenous Once   apixaban  5 mg Oral BID   buPROPion  150 mg Oral Daily   Chlorhexidine Gluconate Cloth  6 each Topical Daily   gabapentin   300 mg Oral TID   levothyroxine  75 mcg Oral QAC breakfast   methadone  15 mg Oral Q8H   multivitamin with minerals  1 tablet Oral Daily   polyethylene glycol  17 g Oral Daily   Ensure Max Protein  11 oz Oral BID   sodium chloride flush  10-40 mL Intracatheter Q12H   Continuous Infusions:  ceFEPime (MAXIPIME) IV 2 g (11/26/20 0541)   famotidine (PEPCID) IV 20 mg (11/25/20 1600)   promethazine (PHENERGAN) injection (IM or IVPB) 12.5 mg (11/26/20 1023)   vancomycin 1,500 mg (11/25/20 1709)     LOS: 4 days        Sheera Illingworth Gerome Apley, MD

## 2020-11-26 NOTE — Progress Notes (Signed)
Latest VS noted w BP drifting down. Dr Kennon Holter paged, awaiting response.

## 2020-11-26 NOTE — Progress Notes (Signed)
Rapid Response Event Note   Reason for Call : RED MEWS   Initial Focused Assessment:  pt A/O and following commands.  Says she has some discomfort in arms and chest but not new.  Pt is sweating, says she had a fever earlier Temperature is now 99 oral.  Pt is initially crying and nervous support given to her.  See Flowsheet for VS.     Interventions: Spoke with [facility], NP will not transfer pt d/t VS are much better.  Pt will receive a NS of 500 ml IV.  Pt bed changed and she voided.  Feels better she says.  Pt hemoglobin 7.7, no obvious signs of bleeding noted. Pt primary, RN made aware. Pt seems much better emotionally as well.    Plan of Care:  Pt will remain in current location per [facility],NP orders.  Please call if there are any more concerns.    Event Summary:   MD Notified: YES Call Time: 2020 Arrival Time: 2025 End Time: 2130  Dyann Ruddle, RN

## 2020-11-27 DIAGNOSIS — E039 Hypothyroidism, unspecified: Secondary | ICD-10-CM

## 2020-11-27 DIAGNOSIS — A419 Sepsis, unspecified organism: Secondary | ICD-10-CM | POA: Diagnosis not present

## 2020-11-27 DIAGNOSIS — J189 Pneumonia, unspecified organism: Secondary | ICD-10-CM | POA: Diagnosis not present

## 2020-11-27 DIAGNOSIS — I2699 Other pulmonary embolism without acute cor pulmonale: Secondary | ICD-10-CM | POA: Diagnosis not present

## 2020-11-27 LAB — BASIC METABOLIC PANEL
Anion gap: 6 (ref 5–15)
BUN: 17 mg/dL (ref 6–20)
CO2: 24 mmol/L (ref 22–32)
Calcium: 7.1 mg/dL — ABNORMAL LOW (ref 8.9–10.3)
Chloride: 102 mmol/L (ref 98–111)
Creatinine, Ser: 0.77 mg/dL (ref 0.44–1.00)
GFR, Estimated: >60 mL/min (ref >60)
Glucose, Bld: 128 mg/dL — ABNORMAL HIGH (ref 70–99)
Potassium: 3 mmol/L — ABNORMAL LOW (ref 3.5–5.1)
Sodium: 132 mmol/L — ABNORMAL LOW (ref 135–145)

## 2020-11-27 LAB — CBC
HCT: 22.8 % — ABNORMAL LOW (ref 36.0–46.0)
Hemoglobin: 7 g/dL — ABNORMAL LOW (ref 12.0–15.0)
MCH: 31.1 pg (ref 26.0–34.0)
MCHC: 30.7 g/dL (ref 30.0–36.0)
MCV: 101.3 fL — ABNORMAL HIGH (ref 80.0–100.0)
Platelets: 57 10*3/uL — ABNORMAL LOW (ref 150–400)
RBC: 2.25 MIL/uL — ABNORMAL LOW (ref 3.87–5.11)
RDW: 18.6 % — ABNORMAL HIGH (ref 11.5–15.5)
WBC: 11.7 10*3/uL — ABNORMAL HIGH (ref 4.0–10.5)
nRBC: 0 % (ref 0.0–0.2)

## 2020-11-27 LAB — CULTURE, BLOOD (ROUTINE X 2)
Culture: NO GROWTH
Culture: NO GROWTH
Special Requests: ADEQUATE

## 2020-11-27 MED ORDER — METHADONE HCL 10 MG PO TABS
15.0000 mg | ORAL_TABLET | Freq: Three times a day (TID) | ORAL | Status: DC
  Administered 2020-11-28 – 2020-11-30 (×9): 15 mg via ORAL
  Filled 2020-11-27 (×9): qty 2

## 2020-11-27 MED ORDER — POTASSIUM CHLORIDE CRYS ER 20 MEQ PO TBCR
40.0000 meq | EXTENDED_RELEASE_TABLET | ORAL | Status: AC
Start: 2020-11-27 — End: 2020-11-27
  Administered 2020-11-27 (×2): 40 meq via ORAL
  Filled 2020-11-27 (×2): qty 2

## 2020-11-27 NOTE — Progress Notes (Signed)
PROGRESS NOTE    Kara Pittman  YQI:[id number] DOB: 11-03-1965 DOA: 11/22/2020 PCP: [name], MD    Brief Narrative:  Kara Pittman was admitted to the hospital with the working diagnosis of sepsis due to post obstructive pneumonia/ infected right chest wound.    55 year old female past medical history for metastatic breast cancer, hypothyroidism, chronic pain syndrome, GERD and depression who presented not feeling well.  Patient reported several days of coughing, patient had fever, nausea and weakness.  Recent hospitalization at [facility] about 18 days ago for similar presentation, discharged 10/14 on analgesic regimen.  Apparently infection was ruled out, fevers were presumed to be related to malignancy (large tumor burden).  She did not received any antibiotic therapy.   Her temperature on admission 98.7, heart rate 123, blood pressure 98/73, oxygen saturation 85% on room air, her lungs are clear to auscultation bilaterally, heart S1-S2, present, rhythmic, her abdomen was distended, no lower extremity edema.   Sodium 129, potassium 4.1, chloride 95, bicarb 25, glucose 114, BUN 16, creatinine 0.96, white count 22.3, hemoglobin 8.2, hematocrit 25.9, platelets 108. SARS COVID-19 negative.   Urinalysis specific gravity 1.012, 0 having 5 white cells.   Chest radiograph with large round opacities right lower lobe and left upper lobe.   EKG 120 bpm, normal axis, normal intervals, sinus rhythm, no significant ST segment or T wave changes, low voltage.   Patient placed on broad spectrum antibiotic therapy and supportive medical care. Improvement of fever but continue to have severe and persistent pain at her right hip region.    Patient with persistent fever and leukocytosis, added antifungal therapy with fluconazole.    Assessment & Plan:   Principal Problem:   Pneumonia (post obstructive pneumonia due to pulmonary metastasis) Active Problems:   Stage IV breast cancer in female  [facility])   Open wound of right chest wall   Anemia of chronic disease   Pulmonary embolism (HCC)   Sepsis (HCC)   Hypothyroid     Sepsis due to post obstructive pneumonia with acute hypoxemic respiratory failure.   Patient has a radiation induced skin lesion on her left/ right breast, with ulcerated wound on the right, with positive secretion, that can not ruled out to be infected.    Patient continue to have intermittent fever, last spike yesterday at 16:00, 102.31F Added IV fluconazole for positive fungal co-infection.  Wbc is down to 11.7 from 19,2  Plan to continue antibiotic therapy with Vancomycin, Cefepime and Fluconazole. Continue to follow up on cell count, cultures and temperature curve.  Port with no signs of local infection.    2. Hyponatremia, hypokalemia.  History of SAIDH. Clinically patient is euvolemic.    Na is up to 132, patient is tolerating po well, will continue to hold on IV fluids and follow up electrolytes in am. K is down to 3,0 and serum bicarbonate at 24, add 80 meq Kcl in 2 divided doses.  Check Mg in am.   Continue with oral salt tablets and hold on IV fluids.   3. 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  She had a right mastectomy May 2021 followed by multiple rounds of chemotherapy and immunotherapy.  Unfortunately developed widespread metastasis to the chest wall, lungs, liver, adrenals and bone.  Severe pain right chest, axilla. In August 2022 she was diagnosed with pulmonary embolism.   Sp 1 units PRBC transfusion.   Continue pain control with  methadone 15 mg tid, and break through pain control with hydromorphone po 8 mg.  Gabapentin 300 mg po tid.   I spoke yesterday with her, her son and Mr. Tobie Poet at the bedside about advance directives and high risk for worsening sepsis. Poor prognosis and very poor outcomes in case of cardiac arrest   For now patient will continue full code but family will discuss further advance directives.   4. Hypothyroid.  Continue with levothyroxine   5. Depression.  Continue with bupropion    6. Pulmonary embolism.  Apixaban for anticoagulation with good toleration   Patient continue to be at high risk for worsening sepsis   Status is: Inpatient  Remains inpatient appropriate because: IV antibiotic therapy     DVT prophylaxis: Apixaban   Code Status:    full  Family Communication:  I spoke with patient's mother at the bedside, we talked in detail about patient's condition, plan of care and prognosis and all questions were addressed.      Nutrition Status: Nutrition Problem: Increased nutrient needs Etiology: cancer and cancer related treatments Signs/Symptoms: estimated needs Interventions: MVI, Premier Protein   Antimicrobials:  Vancomycin, cefepime and fluconazole     Subjective: Patient continue to be very weak and deconditioned, her pain is better controlled, no nausea or vomiting and tolerating po well.   Objective: Vitals:   11/27/20 0141 11/27/20 0509 11/27/20 0931 11/27/20 1142  BP: 117/72 112/72 (!) 115/99 115/66  Pulse: 86 95 (!) 117 (!) 121  Resp: 16 16 18 18   Temp: 97.6 F (36.4 C) (!) 97.4 F (36.3 C) 100 F (37.8 C) 100.2 F (37.9 C)  TempSrc: Oral Oral Oral Oral  SpO2: 97% 94% 94% 90%  Weight:      Height:        Intake/Output Summary (Last 24 hours) at 11/27/2020 1213 Last data filed at 11/27/2020 1000 Gross per 24 hour  Intake 2647.67 ml  Output 300 ml  Net 2347.67 ml   Filed Weights   11/22/20 1231  Weight: 64.4 kg    Examination:   General: Not in pain or dyspnea  Neurology: Awake and alert, non focal  E ENT: positive pallor, no icterus, oral mucosa moist Cardiovascular: No JVD. S1-S2 present, rhythmic, no gallops, rubs, or murmurs. No lower extremity edema. Pulmonary:  positive breath sounds bilaterally, adequate air movement, no  wheezing, rhonchi or rales. Gastrointestinal. Abdomen soft and non tender Skin. Right chest wound with positive secretion, left chest ulcerated wound at the site of the breast. Right upper extremity ulcerated wounds.  Musculoskeletal: no joint deformities     Data Reviewed: I have personally reviewed following labs and imaging studies  CBC: Recent Labs  Lab 11/22/20 1230 11/23/20 0431 11/24/20 0354 11/25/20 0447 11/26/20 0218 11/26/20 2031 11/27/20 0314  WBC 22.3*   < > 14.1* 16.6* 18.7* 19.2* 11.7*  NEUTROABS 20.3*  --  12.9* 15.2*  --   --   --   HGB 8.2*   < > 7.7* 7.8* 7.5* 7.7* 7.0*  HCT 25.9*   < > 24.8* 25.2* 24.5* 24.6* 22.8*  MCV 99.6   < > 99.2 100.0 100.8* 99.2 101.3*  PLT 108*   < > 70* 66* 65* 66* 57*   < > = values in this interval not displayed.   Basic Metabolic Panel: Recent Labs  Lab 11/23/20 0431 11/24/20 0354 11/25/20 0447 11/26/20 0218 11/27/20 0314  NA 128* 132* 129* 127* 132*  K 4.1 3.5 3.5 3.3* 3.0*  CL 100 103 101 97* 102  CO2 22 23 21* 20* 24  GLUCOSE 97 97 114* 90 128*  BUN 12 10 9 9 17   CREATININE 0.38* 0.66 0.63 0.66 0.77  CALCIUM 7.2* 7.0* 6.6* 6.9* 7.1*   GFR: Estimated Creatinine Clearance: 69.8 mL/min (by C-G formula based on SCr of 0.77 mg/dL). Liver Function Tests: Recent Labs  Lab 11/22/20 1230 11/23/20 0431  AST 12* 10*  ALT 16 13  ALKPHOS 123 103  BILITOT 0.6 0.5  PROT 6.1* 5.6*  ALBUMIN 2.1* 1.8*   No results for input(s): LIPASE, AMYLASE in the last 168 hours. No results for input(s): AMMONIA in the last 168 hours. Coagulation Profile: Recent Labs  Lab 11/22/20 1230  INR 1.3*   Cardiac Enzymes: No results for input(s): CKTOTAL, CKMB, CKMBINDEX, TROPONINI in the last 168 hours. BNP (last 3 results) No results for input(s): PROBNP in the last 8760 hours. HbA1C: No results for input(s): HGBA1C in the last 72 hours. CBG: No results for input(s): GLUCAP in the last 168 hours. Lipid Profile: No results for  input(s): CHOL, HDL, LDLCALC, TRIG, CHOLHDL, LDLDIRECT in the last 72 hours. Thyroid Function Tests: No results for input(s): TSH, T4TOTAL, FREET4, T3FREE, THYROIDAB in the last 72 hours. Anemia Panel: No results for input(s): VITAMINB12, FOLATE, FERRITIN, TIBC, IRON, RETICCTPCT in the last 72 hours.    Radiology Studies: I have reviewed all of the imaging during this hospital visit personally     Scheduled Meds:  apixaban  5 mg Oral BID   buPROPion  150 mg Oral Daily   Chlorhexidine Gluconate Cloth  6 each Topical Daily   gabapentin  300 mg Oral TID   levothyroxine  75 mcg Oral QAC breakfast   methadone  15 mg Oral Q8H   multivitamin with minerals  1 tablet Oral Daily   polyethylene glycol  17 g Oral Daily   potassium chloride  40 mEq Oral Q4H   Ensure Max Protein  11 oz Oral BID   sodium chloride flush  10-40 mL Intracatheter Q12H   sodium chloride  1 g Oral TID WC   Continuous Infusions:  ceFEPime (MAXIPIME) IV 2 g (11/27/20 0513)   famotidine (PEPCID) IV 20 mg (11/26/20 1742)   fluconazole (DIFLUCAN) IV     promethazine (PHENERGAN) injection (IM or IVPB) 12.5 mg (11/26/20 1023)   sodium chloride     vancomycin 1,500 mg (11/26/20 1550)     LOS: 5 days        Adra Shepler Gerome Apley, MD

## 2020-11-27 NOTE — Progress Notes (Signed)
   11/27/20 1000  Mobility  Activity Contraindicated/medical hold   RN refused mobility for pt, stated "its been a difficult 24hrs for her". Pt being transferred to tele per RN. Hold mobility.   [facility] Specialist Acute Rehab Services Office: [phone]

## 2020-11-27 NOTE — Progress Notes (Signed)
Pharmacy Antibiotic Note  Kara Pittman is a 55 y.o. female admitted on 11/22/2020 with pneumonia and radiation-induced skin lesion on L breast with ulcerated wound and secretion.  Pharmacy has been consulted for cefepime and vancomycin dosing.  Noted vancomycin infusion reaction noted in allergies from previous admission. Discussed with patient options of slowing down infusion rate and pre-treating with antihistamines, and patient is willing to proceed with vancomycin use.  RN aware to monitor patient for s/sx infusion rash.   D5 abx WBC elev - but improved, SCr wnl, Tmax 101.1 (10/22 am) 10/22 pm Rapid Response (Vancomycin charted 4pm) - source control? CXray 10/18 only imaging  Plan: Vancomycin 1250mg  x1, followed by 1500mg  q24h (eAUC 518, Scr 0.8) Cefepime to 2g IV q8 hours Monitor for signs of vancomycin infusion syndrome - increase infusion time to 120 minutes Add Pepcid 20mg  IV q24 hours prior to vancomycin administration   Height: 5\' 5"  (165.1 cm) Weight: 64.4 kg (142 lb) IBW/kg (Calculated) : 57  Temp (24hrs), Avg:99.5 F (37.5 C), Min:97.4 F (36.3 C), Max:102.4 F (39.1 C)  Recent Labs  Lab 11/22/20 1230 11/23/20 0431 11/24/20 0354 11/25/20 0447 11/26/20 0218 11/26/20 2031 11/27/20 0314  WBC 22.3* 15.0* 14.1* 16.6* 18.7* 19.2* 11.7*  CREATININE 0.96 0.38* 0.66 0.63 0.66  --  0.77  LATICACIDVEN 1.1  --   --   --   --   --   --      Estimated Creatinine Clearance: 69.8 mL/min (by C-G formula based on SCr of 0.77 mg/dL).    Allergies  Allergen Reactions   Morphine And Related Hives   Hydrocodone-Acetaminophen Nausea Only   Vancomycin Other (See Comments)    Redman's Syndrome    Thank you for allowing pharmacy to be a part of this patient's care.  Minda Ditto PharmD [facility] [phone] 11/27/2020, 10:29 AM

## 2020-11-27 NOTE — Progress Notes (Signed)
   11/27/20 2211  Assess: MEWS Score  Temp (!) 102.1 F (38.9 C)  BP 137/84  Pulse Rate (!) 127  Resp 18  SpO2 99 %  Assess: MEWS Score  MEWS Temp 2  MEWS Systolic 0  MEWS Pulse 2  MEWS RR 0  MEWS LOC 0  MEWS Score 4  MEWS Score Color Red  Assess: if the MEWS score is Yellow or Red  Were vital signs taken at a resting state? Yes  Focused Assessment No change from prior assessment  Does the patient meet 2 or more of the SIRS criteria? Yes  Does the patient have a confirmed or suspected source of infection? Yes  Provider and Rapid Response Notified? No  MEWS guidelines implemented *See Row Information* Yes  Treat  MEWS Interventions Administered scheduled meds/treatments  Pain Scale 0-10  Pain Score 8  Faces Pain Scale 8  Pain Type Acute pain  Pain Location Back  Pain Orientation Mid;Lower  Pain Radiating Towards back, hip  Pain Descriptors / Indicators Constant;Sharp  Pain Frequency Constant  Pain Onset Gradual  Patients Stated Pain Goal 2  Pain Intervention(s) Medication (See eMAR)  Multiple Pain Sites No  Complains of Fever  Interventions Medication (see MAR)  Take Vital Signs  Increase Vital Sign Frequency  Red: Q 1hr X 4 then Q 4hr X 4, if remains red, continue Q 4hrs  Escalate  MEWS: Escalate Red: discuss with charge nurse/RN and provider, consider discussing with RRT  Notify: Charge Nurse/RN  Name of Charge Nurse/RN Notified Adrianna, RN  Date Charge Nurse/RN Notified 11/27/20  Time Charge Nurse/RN Notified 2235  Notify: Provider  Provider Name/Title Jeannette Corpus  Date Provider Notified 11/27/20  Time Provider Notified 2256  Notification Type Page  Notification Reason Change in status  Assess: SIRS CRITERIA  SIRS Temperature  1  SIRS Pulse 1  SIRS Respirations  0  SIRS WBC 0  SIRS Score Sum  2  protocol initiated. Ice packs *4 applied for cooling, room temp decreased to 68 degress. Awaiting provider response

## 2020-11-27 NOTE — Progress Notes (Signed)
On-Call (Blount) paged/messaged due to patient continuing with [place] since 11/26/2020 at 0200, vital signs declining with current code status, and possible need to transfer to higher level of care. On-Call (Blount) returned call and advised she would be to the floor to assess the patient. AC made aware of patient status and advised to have Rapid assess the patient. Genell, RN with Rapid Response notified and to the floor to assess the patient at 2025.

## 2020-11-28 ENCOUNTER — Other Ambulatory Visit (HOSPITAL_COMMUNITY): Payer: Self-pay

## 2020-11-28 DIAGNOSIS — D638 Anemia in other chronic diseases classified elsewhere: Secondary | ICD-10-CM | POA: Diagnosis not present

## 2020-11-28 DIAGNOSIS — E039 Hypothyroidism, unspecified: Secondary | ICD-10-CM | POA: Diagnosis not present

## 2020-11-28 DIAGNOSIS — J189 Pneumonia, unspecified organism: Secondary | ICD-10-CM | POA: Diagnosis not present

## 2020-11-28 DIAGNOSIS — I2699 Other pulmonary embolism without acute cor pulmonale: Secondary | ICD-10-CM | POA: Diagnosis not present

## 2020-11-28 LAB — BASIC METABOLIC PANEL
Anion gap: 4 — ABNORMAL LOW (ref 5–15)
BUN: 13 mg/dL (ref 6–20)
CO2: 24 mmol/L (ref 22–32)
Calcium: 7.2 mg/dL — ABNORMAL LOW (ref 8.9–10.3)
Chloride: 102 mmol/L (ref 98–111)
Creatinine, Ser: 0.67 mg/dL (ref 0.44–1.00)
GFR, Estimated: >60 mL/min (ref >60)
Glucose, Bld: 103 mg/dL — ABNORMAL HIGH (ref 70–99)
Potassium: 4.1 mmol/L (ref 3.5–5.1)
Sodium: 130 mmol/L — ABNORMAL LOW (ref 135–145)

## 2020-11-28 LAB — CBC
HCT: 22.9 % — ABNORMAL LOW (ref 36.0–46.0)
Hemoglobin: 7 g/dL — ABNORMAL LOW (ref 12.0–15.0)
MCH: 31.4 pg (ref 26.0–34.0)
MCHC: 30.6 g/dL (ref 30.0–36.0)
MCV: 102.7 fL — ABNORMAL HIGH (ref 80.0–100.0)
Platelets: 57 10*3/uL — ABNORMAL LOW (ref 150–400)
RBC: 2.23 MIL/uL — ABNORMAL LOW (ref 3.87–5.11)
RDW: 18.6 % — ABNORMAL HIGH (ref 11.5–15.5)
WBC: 16.8 10*3/uL — ABNORMAL HIGH (ref 4.0–10.5)
nRBC: 0 % (ref 0.0–0.2)

## 2020-11-28 MED ORDER — IPRATROPIUM-ALBUTEROL 0.5-2.5 (3) MG/3ML IN SOLN
3.0000 mL | RESPIRATORY_TRACT | Status: DC | PRN
  Administered 2020-11-28: 3 mL via RESPIRATORY_TRACT
  Filled 2020-11-28 (×2): qty 3

## 2020-11-28 NOTE — Progress Notes (Signed)
PROGRESS NOTE    Kara Pittman  NFA:[id number] DOB: May 31, 1965 DOA: 11/22/2020 PCP: [name], MD    Brief Narrative:  Kara Pittman was admitted to the hospital with the working diagnosis of sepsis due to post obstructive pneumonia/ infected right chest wound.    55 year old female past medical history for metastatic breast cancer, hypothyroidism, chronic pain syndrome, GERD and depression who presented not feeling well.  Patient reported several days of coughing, patient had fever, nausea and weakness.  Recent hospitalization at [facility] about 18 days ago for similar presentation, discharged 10/14 on analgesic regimen.  Apparently infection was ruled out, fevers were presumed to be related to malignancy (large tumor burden).  She did not received any antibiotic therapy.   Her temperature on admission 98.7, heart rate 123, blood pressure 98/73, oxygen saturation 85% on room air, her lungs are clear to auscultation bilaterally, heart S1-S2, present, rhythmic, her abdomen was distended, no lower extremity edema.   Sodium 129, potassium 4.1, chloride 95, bicarb 25, glucose 114, BUN 16, creatinine 0.96, white count 22.3, hemoglobin 8.2, hematocrit 25.9, platelets 108. SARS COVID-19 negative.   Urinalysis specific gravity 1.012, 0 having 5 white cells.   Chest radiograph with large round opacities right lower lobe and left upper lobe.   EKG 120 bpm, normal axis, normal intervals, sinus rhythm, no significant ST segment or T wave changes, low voltage.   Patient placed on broad spectrum antibiotic therapy and supportive medical care. Improvement of fever but continue to have severe and persistent pain at her right hip region.    Patient with persistent fever and leukocytosis, added antifungal therapy with fluconazole.  Continue to have fever and leukocytosis despite broad spectrum antibiotic therapy, likely component of malignancy related fever and leukocytosis.   Patient with poor  prognosis continue to have pain. Code status changed to DNR and palliative care team has been consulted.   Assessment & Plan:   Principal Problem:   Pneumonia (post obstructive pneumonia due to pulmonary metastasis) Active Problems:   Stage IV breast cancer in female [facility])   Open wound of right chest wall   Anemia of chronic disease   Pulmonary embolism (HCC)   Sepsis (HCC)   Hypothyroid   Sepsis due to post obstructive pneumonia with acute hypoxemic respiratory failure.   Patient has a radiation induced skin lesion on her left/ right breast, with ulcerated wound on the right, with positive secretion, that can not ruled out to be infected.    Despite triple antibiotic therapy including antifungal patient continue to have fever and leukocytosis, likely has component of malignancy related systemic inflammatory response syndrome.    Antibiotic therapy with Vancomycin, Cefepime and Fluconazole. Plan to discontinue all antibiotic therapy if no significant improvement in the next 24 hrs.  Patient with very poor prognosis, her mother is at the bedside. I have explained them that likely symptoms are related to progressive and aggressive metastatic breast cancer. Code status changed to DNR.     2. Hyponatremia, hypokalemia.  History of SAIDH. Clinically patient is euvolemic.  Na is 130, K is 4,1 and serum bicarbonate is 24.  Patient with poor oral intake.  Continue salt tablets for now.  Holding IV fluids.     3. 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  She had a right mastectomy May 2021 followed by multiple rounds of chemotherapy and immunotherapy.  Unfortunately developed widespread metastasis to the chest wall,  lungs, liver, adrenals and bone.  Severe pain right chest, axilla. In August 2022 she was diagnosed with pulmonary embolism.   Sp 1 units PRBC transfusion.    Persistent leukocytosis with wbc at 16,8.  Hgb is 7,0 and hct at 22,9   Pain continue to be not well controlled.  For now will continue with methadone 15 mg tid, and break through pain control with hydromorphone po 8 mg.  Gabapentin 300 mg po tid.  Consult palliative care for further recommendations.    4. Hypothyroid.  On levothyroxine   5. Depression.  On bupropion    6. Pulmonary embolism.  Continue with Apixaban for anticoagulation    Patient continue to be at high risk for worsening systemic inflammatory response.   Status is: Inpatient  Remains inpatient appropriate because: IV antibiotic therapy    DVT prophylaxis: Apixaban   Code Status:    DNR  Family Communication:   I spoke with patient's mother at the bedside, we talked in detail about patient's condition, plan of care and prognosis and all questions were addressed.      Nutrition Status: Nutrition Problem: Increased nutrient needs Etiology: cancer and cancer related treatments Signs/Symptoms: estimated needs Interventions: MVI, Premier Protein     Consultants:  Palliative     Antimicrobials:  Vancomycin, cefepime and fluconazole     Subjective: Patient continue to be very weak and deconditioned, continue to have pain. No nausea or vomiting, poor oral intake.   Objective: Vitals:   11/28/20 0214 11/28/20 0302 11/28/20 0646 11/28/20 1059  BP: 121/70 102/63 106/73 140/66  Pulse: (!) 113 (!) 105 (!) 106 (!) 121  Resp: 16 17 16 18   Temp: 98.8 F (37.1 C) 98.6 F (37 C) 99 F (37.2 C) (!) 101.3 F (38.5 C)  TempSrc: Oral Oral Oral Oral  SpO2: 100% 100% 100% 99%  Weight:      Height:        Intake/Output Summary (Last 24 hours) at 11/28/2020 1253 Last data filed at 11/28/2020 1000 Gross per 24 hour  Intake 1946.7 ml  Output 2 ml  Net 1944.7 ml   Filed Weights   11/22/20 1231  Weight: 64.4 kg    Examination:   General: deconditioned and ill looking appearing  Neurology: Awake  and alert, non focal  E ENT: no pallor, no icterus, oral mucosa moist Cardiovascular: No JVD. S1-S2 present, rhythmic, no gallops, rubs, or murmurs. No lower extremity edema. Pulmonary: positive breath sounds bilaterally,  with no wheezing, rhonchi or rales. Anterior auscultation  Gastrointestinal. Abdomen soft and non tender Skin. Extensive radiation skin injury with ulceration right chest and right arm,  Musculoskeletal: no joint deformities     Data Reviewed: I have personally reviewed following labs and imaging studies  CBC: Recent Labs  Lab 11/22/20 1230 11/23/20 0431 11/24/20 0354 11/25/20 0447 11/26/20 0218 11/26/20 2031 11/27/20 0314 11/28/20 0322  WBC 22.3*   < > 14.1* 16.6* 18.7* 19.2* 11.7* 16.8*  NEUTROABS 20.3*  --  12.9* 15.2*  --   --   --   --   HGB 8.2*   < > 7.7* 7.8* 7.5* 7.7* 7.0* 7.0*  HCT 25.9*   < > 24.8* 25.2* 24.5* 24.6* 22.8* 22.9*  MCV 99.6   < > 99.2 100.0 100.8* 99.2 101.3* 102.7*  PLT 108*   < > 70* 66* 65* 66* 57* 57*   < > = values in this interval not displayed.   Basic Metabolic Panel: Recent Labs  Lab  11/24/20 0354 11/25/20 0447 11/26/20 0218 11/27/20 0314 11/28/20 0322  NA 132* 129* 127* 132* 130*  K 3.5 3.5 3.3* 3.0* 4.1  CL 103 101 97* 102 102  CO2 23 21* 20* 24 24  GLUCOSE 97 114* 90 128* 103*  BUN 10 9 9 17 13   CREATININE 0.66 0.63 0.66 0.77 0.67  CALCIUM 7.0* 6.6* 6.9* 7.1* 7.2*   GFR: Estimated Creatinine Clearance: 69.8 mL/min (by C-G formula based on SCr of 0.67 mg/dL). Liver Function Tests: Recent Labs  Lab 11/22/20 1230 11/23/20 0431  AST 12* 10*  ALT 16 13  ALKPHOS 123 103  BILITOT 0.6 0.5  PROT 6.1* 5.6*  ALBUMIN 2.1* 1.8*   No results for input(s): LIPASE, AMYLASE in the last 168 hours. No results for input(s): AMMONIA in the last 168 hours. Coagulation Profile: Recent Labs  Lab 11/22/20 1230  INR 1.3*   Cardiac Enzymes: No results for input(s): CKTOTAL, CKMB, CKMBINDEX, TROPONINI in the last 168  hours. BNP (last 3 results) No results for input(s): PROBNP in the last 8760 hours. HbA1C: No results for input(s): HGBA1C in the last 72 hours. CBG: No results for input(s): GLUCAP in the last 168 hours. Lipid Profile: No results for input(s): CHOL, HDL, LDLCALC, TRIG, CHOLHDL, LDLDIRECT in the last 72 hours. Thyroid Function Tests: No results for input(s): TSH, T4TOTAL, FREET4, T3FREE, THYROIDAB in the last 72 hours. Anemia Panel: No results for input(s): VITAMINB12, FOLATE, FERRITIN, TIBC, IRON, RETICCTPCT in the last 72 hours.    Radiology Studies: I have reviewed all of the imaging during this hospital visit personally     Scheduled Meds:  apixaban  5 mg Oral BID   buPROPion  150 mg Oral Daily   Chlorhexidine Gluconate Cloth  6 each Topical Daily   gabapentin  300 mg Oral TID   levothyroxine  75 mcg Oral QAC breakfast   methadone  15 mg Oral Q8H   multivitamin with minerals  1 tablet Oral Daily   polyethylene glycol  17 g Oral Daily   Ensure Max Protein  11 oz Oral BID   sodium chloride flush  10-40 mL Intracatheter Q12H   sodium chloride  1 g Oral TID WC   Continuous Infusions:  ceFEPime (MAXIPIME) IV 2 g (11/28/20 0537)   famotidine (PEPCID) IV 20 mg (11/27/20 1551)   fluconazole (DIFLUCAN) IV 400 mg (11/27/20 2304)   promethazine (PHENERGAN) injection (IM or IVPB) 12.5 mg (11/26/20 1023)   sodium chloride     vancomycin 1,500 mg (11/27/20 1653)     LOS: 6 days        Dacen Frayre Gerome Apley, MD

## 2020-11-28 NOTE — Progress Notes (Signed)
Nutrition Follow-up  INTERVENTION:   -Ensure MAX Protein po BID, each supplement provides 150 kcal and 30 grams of protein   -Pt also having supplements and meals brought from home  -Multivitamin with minerals daily  NUTRITION DIAGNOSIS:   Increased nutrient needs related to cancer and cancer related treatments as evidenced by estimated needs.  Ongoing  GOAL:   Patient will meet greater than or equal to 90% of their needs  Progressing.  MONITOR:   PO intake, Supplement acceptance, Labs, Weight trends, I & O's, Skin  REASON FOR ASSESSMENT:   Consult Assessment of nutrition requirement/status  ASSESSMENT:   55 year old female past medical history for metastatic breast cancer, hypothyroidism, chronic pain syndrome, GERD and depression who presented not feeling well.  Patient reported several days of coughing, patient had fever, nausea and weakness. Admitted to the hospital with the working diagnosis of sepsis due to post obstructive pneumonia/ infected left chest wound.  Patient now consuming 25-50% of meals. Having supplements brought in from home.  Per chart review, palliative care has been consulted for [place] given poor prognosis.  Admission weight: 142 lbs. No other weights recorded for admission.  Medications: Multivitamin with minerals daily, Miralax,   Labs reviewed:  Low Na  Diet Order:   Diet Order             Diet regular Room service appropriate? Yes; Fluid consistency: Thin  Diet effective now                   EDUCATION NEEDS:   Education needs have been addressed  Skin:  Skin Assessment: Skin Integrity Issues: Skin Integrity Issues:: Other (Comment) Other: rt hip blister Per WOC note, multiple chronic full thickness wounds to the chest related to cancer and radiation.  Last BM:  10/19  Height:   Ht Readings from Last 1 Encounters:  11/22/20 5\' 5"  (1.651 m)    Weight:   Wt Readings from Last 1 Encounters:  11/22/20 64.4 kg     BMI:  Body mass index is 23.63 kg/m.  Estimated Nutritional Needs:   Kcal:  1900-2100  Protein:  95-105g  Fluid:  2L/day  Clayton Bibles, MS, RD, LDN Inpatient Clinical Dietitian Contact information available via Amion

## 2020-11-28 NOTE — Consult Note (Addendum)
WOC Nurse Consult Note: Consult requested to provide topical treatment orders for breast cancer lesions. Performed remotely after review of progress notes and photos in the EMR.  There are multiple chronic full thickness wounds to the chest related to cancer and radiation; dressings will not promote healing related to the etiology of the wounds, but care is directed towards minimizing pain and bleeding and absorbing drainage. Wounds are red and moist. Pt is familiar to [facility] team from previous admission on 10/02/19.  Dressing procedure/placement/frequency: Topical treatment orders provided for bedside nurses to perform as follows: Apply double-folded xeroform gauze over chest wounds Q day, then cover with ABD pads and tape or hold in place with mesh underwear with crotch cut open to make a "tube top."  Moisten previous dressing with NS to assist with removal and gently pat with moist gauze to clean.   Please re-consult if further assistance is needed.  Thank-you,  Julien Girt MSN, [place], [place], [place], [place]

## 2020-11-29 ENCOUNTER — Encounter (HOSPITAL_COMMUNITY): Payer: Self-pay | Admitting: Family Medicine

## 2020-11-29 DIAGNOSIS — C50919 Malignant neoplasm of unspecified site of unspecified female breast: Secondary | ICD-10-CM | POA: Diagnosis not present

## 2020-11-29 DIAGNOSIS — E039 Hypothyroidism, unspecified: Secondary | ICD-10-CM | POA: Diagnosis not present

## 2020-11-29 DIAGNOSIS — Z7189 Other specified counseling: Secondary | ICD-10-CM

## 2020-11-29 DIAGNOSIS — D638 Anemia in other chronic diseases classified elsewhere: Secondary | ICD-10-CM | POA: Diagnosis not present

## 2020-11-29 DIAGNOSIS — J189 Pneumonia, unspecified organism: Secondary | ICD-10-CM | POA: Diagnosis not present

## 2020-11-29 DIAGNOSIS — S21101D Unspecified open wound of right front wall of thorax without penetration into thoracic cavity, subsequent encounter: Secondary | ICD-10-CM | POA: Diagnosis not present

## 2020-11-29 DIAGNOSIS — Z515 Encounter for palliative care: Secondary | ICD-10-CM | POA: Diagnosis not present

## 2020-11-29 DIAGNOSIS — I2699 Other pulmonary embolism without acute cor pulmonale: Secondary | ICD-10-CM | POA: Diagnosis not present

## 2020-11-29 LAB — CBC
HCT: 21.7 % — ABNORMAL LOW (ref 36.0–46.0)
Hemoglobin: 6.7 g/dL — CL (ref 12.0–15.0)
MCH: 31.5 pg (ref 26.0–34.0)
MCHC: 30.9 g/dL (ref 30.0–36.0)
MCV: 101.9 fL — ABNORMAL HIGH (ref 80.0–100.0)
Platelets: 51 10*3/uL — ABNORMAL LOW (ref 150–400)
RBC: 2.13 MIL/uL — ABNORMAL LOW (ref 3.87–5.11)
RDW: 18.6 % — ABNORMAL HIGH (ref 11.5–15.5)
WBC: 16.8 10*3/uL — ABNORMAL HIGH (ref 4.0–10.5)
nRBC: 0 % (ref 0.0–0.2)

## 2020-11-29 LAB — BASIC METABOLIC PANEL
Anion gap: 8 (ref 5–15)
BUN: 15 mg/dL (ref 6–20)
CO2: 22 mmol/L (ref 22–32)
Calcium: 7.4 mg/dL — ABNORMAL LOW (ref 8.9–10.3)
Chloride: 99 mmol/L (ref 98–111)
Creatinine, Ser: 0.76 mg/dL (ref 0.44–1.00)
GFR, Estimated: >60 mL/min (ref >60)
Glucose, Bld: 111 mg/dL — ABNORMAL HIGH (ref 70–99)
Potassium: 4 mmol/L (ref 3.5–5.1)
Sodium: 129 mmol/L — ABNORMAL LOW (ref 135–145)

## 2020-11-29 LAB — PREPARE RBC (CROSSMATCH)

## 2020-11-29 MED ORDER — LIDOCAINE 5 % EX PTCH
3.0000 | MEDICATED_PATCH | Freq: Every day | CUTANEOUS | Status: DC
  Administered 2020-11-29 – 2020-12-03 (×5): 3 via TRANSDERMAL
  Filled 2020-11-29 (×5): qty 3

## 2020-11-29 MED ORDER — SODIUM CHLORIDE 0.9% IV SOLUTION: Freq: Once | INTRAVENOUS | Status: AC

## 2020-11-29 MED ORDER — HYDROMORPHONE HCL 1 MG/ML IJ SOLN
0.5000 mg | Freq: Every day | INTRAMUSCULAR | Status: DC | PRN
  Administered 2020-12-03: 0.5 mg via INTRAVENOUS
  Filled 2020-11-29: qty 0.5

## 2020-11-29 NOTE — Progress Notes (Signed)
Physical Therapy Treatment Patient Details Name: Kara Pittman MRN: [id number] DOB: 1965/07/19 Today's Date: 11/29/2020   History of Present Illness 55 yo female presenting with cough, fever, nausea, and weakness. Recent admission to [facility] for 18 days (dc 10/14). PMH including metastatic breast cancer, hypothyroidism, chronic pain syndrome, GERD and depression.    PT Comments    Patient was limited this session by pain and chest/ underarm discomfort. Patient also feeling weaker due to low H/H but receiving 1 unit RBC today. Agreeable to complete exercises and mobilize to chair today. Pt instructed on sit<>stands for leg strength and upper thoracic mobility stretching exercises to address back pain. Acute PT will continue to address impairments and progress pt as able. Encouraged pt to mobilize with RN/NT staff as able.    Recommendations for follow up therapy are one component of a multi-disciplinary discharge planning process, led by the attending physician.  Recommendations may be updated based on patient status, additional functional criteria and insurance authorization.  Follow Up Recommendations  No PT follow up     Assistance Recommended at Discharge Intermittent Supervision/Assistance  Equipment Recommendations  Rolling walker (2 wheels)    Recommendations for Other Services       Precautions / Restrictions Precautions Precautions: Fall Restrictions Weight Bearing Restrictions: No     Mobility  Bed Mobility Overal bed mobility: Modified Independent             General bed mobility comments: Increased time but np physical assist; HOB elevated    Transfers Overall transfer level: Needs assistance Equipment used: Rolling walker (2 wheels) Transfers: Sit to/from [employer] Transfers Sit to Stand: Supervision Stand pivot transfers: Supervision         General transfer comment: increase time, supervision for safety with rise and step to recliner.     Ambulation/Gait             General Gait Details: pt declined amb to hall due to fatigued, weakness.   Stairs             Wheelchair Mobility    Modified Rankin (Stroke Patients Only)       Balance Overall balance assessment: Needs assistance Sitting-balance support: No upper extremity supported;Feet supported Sitting balance-Leahy Scale: Good     Standing balance support: No upper extremity supported Standing balance-Leahy Scale: Fair                              Cognition Arousal/Alertness: Awake/alert Behavior During Therapy: WFL for tasks assessed/performed Overall Cognitive Status: Within Functional Limits for tasks assessed                                 General Comments: Very agreeable to therapy        Exercises Other Exercises Other Exercises: 5 reps sit<>stand from recliner, UE's on thighs for rise. Other Exercises: 5 reps thoracic extension over bolster for mobility/stretch Other Exercises: 3 reps low back extension at counter for lumbar stretch    General Comments        Pertinent Vitals/Pain Pain Assessment: Faces Faces Pain Scale: Hurts little more Pain Location: R hip and back Pain Descriptors / Indicators: Discomfort;Grimacing Pain Intervention(s): Limited activity within patient's tolerance;Monitored during session;Repositioned    Home Living  Prior Function            PT Goals (current goals can now be found in the care plan section) Acute Rehab PT Goals Patient Stated Goal: Go home PT Goal Formulation: With patient Time For Goal Achievement: 12/09/20 Potential to Achieve Goals: Good Progress towards PT goals: Progressing toward goals    Frequency    Min 3X/week      PT Plan Current plan remains appropriate    Co-evaluation              AM-PAC PT "6 Clicks" Mobility   Outcome Measure  Help needed turning from your back to your side while  in a flat bed without using bedrails?: None Help needed moving from lying on your back to sitting on the side of a flat bed without using bedrails?: None Help needed moving to and from a bed to a chair (including a wheelchair)?: A Little Help needed standing up from a chair using your arms (e.g., wheelchair or bedside chair)?: A Little Help needed to walk in hospital room?: A Little Help needed climbing 3-5 steps with a railing? : A Little 6 Click Score: 20    End of Session Equipment Utilized During Treatment: Gait belt;Oxygen Activity Tolerance: Patient tolerated treatment well;Patient limited by fatigue Patient left: with call bell/phone within reach;with family/visitor present;in chair Nurse Communication: Mobility status PT Visit Diagnosis: Difficulty in walking, not elsewhere classified (R26.2)     Time: 9242-6834 PT Time Calculation (min) (ACUTE ONLY): 31 min  Charges:  $Therapeutic Exercise: 8-22 mins $Therapeutic Activity: 8-22 mins                     Verner Mould, DPT Acute Rehabilitation Services Office [phone] Pager [phone]    Jacques Navy 11/29/2020, 12:40 PM

## 2020-11-29 NOTE — Consult Note (Signed)
Consultation Note Date: 11/29/2020   Patient Name: Kara Pittman  DOB: 1966-02-03  MRN: [id number]  Age / Sex: 55 y.o., female  PCP: [name], MD Referring Physician: Tawni Millers  Reason for Consultation: Establishing goals of care, Non pain symptom management, and Pain control  HPI/Patient Profile: 55 y.o. female    admitted on 11/22/2020 with  .   Clinical Assessment and Goals of Care: 55 year old lady with metastatic breast cancer, hypothyroidism, chronic pain syndrome GERD and depression.  Patient has had her oncologic care here at [facility] long and more recently over at [facility] as well.  She was also seeing palliative practitioners at [facility], chart reviewed extensively.  Patient was recently hospitalized at [facility] and was discharged on analgesic regimen.  Infection was ruled out.  Fevers were presumed to be related to malignancy and large tumor burden.  Patient admitted to hospital medicine service with a working diagnosis of sepsis due to postobstructive pneumonia, acute hypoxic respiratory failure as well as electrolyte abnormalities such as hyponatremia and hypokalemia. 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  Has been through multiple rounds of chemotherapy and immunotherapy.  Has evidence of widespread metastases to chest wall, lungs liver adrenals and bone.  Also has a history of pulmonary embolism. Patient complains of severe ongoing pain over her chest wall as well as both axillae.  Currently getting PRBC transfusion. Palliative consultation for pain management and broad goals of care discussions has been requested. Patient is awake alert resting in bed.  Her mother is present at the bedside.  I introduced myself and palliative care as follows: Palliative medicine  is specialized medical care for people living with serious illness. It focuses on providing relief from the symptoms and stress of a serious illness. The goal is to improve quality of life for both the patient and the family. Goals of care: Broad aims of medical therapy in relation to the patient's values and preferences. Our aim is to provide medical care aimed at enabling patients to achieve the goals that matter most to them, given the circumstances of their particular medical situation and their constraints.   We reviewed the patient's cancer history.  We reviewed the patient's goals wishes and values in light of current conditions.  See below.  NEXT OF KIN Lives at home in [place], [place].  Has a fianc.  Has an adult son.  Has a mother who is currently at bedside.  SUMMARY OF RECOMMENDATIONS   Agree with DNR. Medication history reviewed, chart reviewed in detail.  Patient on methadone 15 mg 3 times daily, has oral hydromorphone 8 mg for breakthrough pain.  Adjuvants are gabapentin 300 mg p.o. 3 times daily. Will add lidocaine patches, patient states that this has helped in the past.  Will add IV Dilaudid for incident pain, to be used just prior to her dressing changes. Continue current pain and non pain symptom management. Brief discussion  about broad goals of care and patient's wishes today at the time of initial consultation.  Patient wishes to continue current mode of care, states that she wants to be alive for as long as possible for the sake of her grandchildren.  Her goals are to have both quality of life as well as quantity.  Offered active listening and supportive care at the time of this initial consultation today.  Thank you for the consult. Code Status/Advance Care Planning: DNR   Symptom Management:     Palliative Prophylaxis:  Frequent Pain Assessment  Additional Recommendations (Limitations, Scope, Preferences): Full Scope Treatment  Psycho-social/Spiritual:   Desire for further Chaplaincy support:yes Additional Recommendations: Caregiving  Support/Resources  Prognosis:  Unable to determine  Discharge Planning: To Be Determined      Primary Diagnoses: Present on Admission:  Sepsis ([place])  Anemia of chronic disease  Stage IV breast cancer in female [facility])  Open wound of right chest wall  Pulmonary embolism ([place])   I have reviewed the medical record, interviewed the patient and family, and examined the patient. The following aspects are pertinent.  Past Medical History:  Diagnosis Date   Arthritis    Asthma    as a child   Cancer ([place])    Breast ; melonoma - Back and right ear   Complication of anesthesia    Depression    situational   Family history of leukemia    Family history of lung cancer    Family history of skin cancer    Family history of throat cancer    Headache    migraine - decrease number   Hypothyroidism    Personal history of chemotherapy    Started 12/30/18 and currently still having treatments   PONV (postoperative nausea and vomiting)    Violently sick   Social History   Socioeconomic History   Marital status: Significant Other    Spouse name: Not on file   Number of children: Not on file   Years of education: Not on file   Highest education level: Some college, no degree  Occupational History   Not on file  Tobacco Use   Smoking status: Never   Smokeless tobacco: Never  Vaping Use   Vaping Use: Never used  Substance and Sexual Activity   Alcohol use: Yes    Comment: occassionally   Drug use: Never   Sexual activity: Yes    Birth control/protection: Surgical  Other Topics Concern   Not on file  Social History Narrative   Not on file   Social Determinants of Health   Financial Resource Strain: Not on file  Food Insecurity: Not on file  Transportation Needs: Not on file  Physical Activity: Not on file  Stress: Not on file  Social Connections: Not on file   Family History  Problem  Relation Age of Onset   Congestive Heart Failure Mother    Skin cancer Mother        non-melanoma   Heart disease Sister    Heart attack Brother 56   Heart disease Other    Skin cancer Father        non-melanoma   Leukemia Maternal Uncle 42   Throat cancer Maternal Uncle        diagnosed late 75s   Melanoma Paternal Aunt    Cancer Paternal Aunt        salivary gland   Skin cancer Paternal Uncle        non-melanoma  Lung cancer Other        paternal great-uncle   Scheduled Meds:  buPROPion  150 mg Oral Daily   Chlorhexidine Gluconate Cloth  6 each Topical Daily   gabapentin  300 mg Oral TID   levothyroxine  75 mcg Oral QAC breakfast   lidocaine  3 patch Transdermal Daily   methadone  15 mg Oral Q8H   multivitamin with minerals  1 tablet Oral Daily   polyethylene glycol  17 g Oral Daily   Ensure Max Protein  11 oz Oral BID   sodium chloride flush  10-40 mL Intracatheter Q12H   sodium chloride  1 g Oral TID WC   Continuous Infusions:  promethazine (PHENERGAN) injection (IM or IVPB) 12.5 mg (11/26/20 1023)   PRN Meds:.acetaminophen, HYDROmorphone (DILAUDID) injection, HYDROmorphone (DILAUDID) injection, HYDROmorphone, ipratropium-albuterol, lidocaine, methocarbamol, ondansetron **OR** ondansetron (ZOFRAN) IV, promethazine (PHENERGAN) injection (IM or IVPB), sodium chloride flush Medications Prior to Admission:  Prior to Admission medications   Medication Sig Start Date End Date Taking? Authorizing Provider  acetaminophen (TYLENOL) 500 MG tablet Take 500-1,000 mg by mouth every 6 (six) hours as needed for mild pain or headache.   Yes [provider]  apixaban (ELIQUIS) 5 MG TABS tablet Take 5 mg by mouth 2 (two) times daily.   Yes [provider]  buPROPion (WELLBUTRIN XL) 150 MG 24 hr tablet TAKE 1 TABLET BY MOUTH ONCE DAILY Patient taking differently: Take 150 mg by mouth daily. 03/07/20 03/07/21 Yes Hensel, Jamal Collin, MD  gabapentin (NEURONTIN) 300 MG  capsule Take 1 capsule by mouth at bedtime for 3 days then take 1 capsule twice daily for 3 days then take 1 capsule 3 times daily Patient taking differently: Take 300 mg by mouth 3 (three) times daily. 10/13/20  Yes   Lidocaine 4 % PTCH Apply 3 patchs topically once daily for 10 days as directed Patient taking differently: Apply 1 application topically daily as needed (For back pain pain). 11/18/20  Yes   methadone (DOLOPHINE) 10 MG tablet Take 10 mg by mouth every 8 (eight) hours.   Yes [provider]  ondansetron (ZOFRAN-ODT) 4 MG disintegrating tablet Dissolve 1 tablet (4 mg total) by mouth every morning before breakfast 11/18/20  Yes   pantoprazole (PROTONIX) 40 MG tablet Take 1 tablet by mouth daily. Patient taking differently: Take 40 mg by mouth daily as needed (For GERD). 10/31/20  Yes   Pseudoephedrine-guaiFENesin ([employer] TRIACTING EXPECTORANT PO) Take 2 tablets by mouth 2 (two) times daily as needed (cough).   Yes [provider]  SYNTHROID 75 MCG tablet TAKE 1 TABLET (75 MCG TOTAL) BY MOUTH DAILY BEFORE BREAKFAST. Patient taking differently: Take 75 mcg by mouth daily before breakfast. 10/17/20 10/17/21 Yes Hensel, Jamal Collin, MD  UDENYCA 6 MG/0.6ML injection Inject 6 mg into the skin every 21 ( twenty-one) days. 09/13/20  Yes [provider]  apixaban (ELIQUIS) 5 MG TABS tablet Take 2 tablets (10 mg total) by mouth twice daily for 7 days, then take 1 tablet twice daily. Patient not taking: Reported on 11/22/2020 10/03/20 11/22/20  Dessa Phi, DO  benzonatate (TESSALON) 100 MG capsule Take 1 capsule (100 mg total) by mouth 3 (three) times daily as needed for Cough for up to 7 days Patient not taking: Reported on 11/22/2020 09/29/20     benzonatate (TESSALON) 100 MG capsule Take 1 capsule (100 mg total) by mouth 3 (three) times daily as needed for Cough Patient not taking: Reported on 11/22/2020 10/06/20  dexamethasone (DECADRON) 2 MG tablet Take 1 tablet by mouth  daily with breakfast Patient not taking: Reported on 11/22/2020 10/31/20     lidocaine (LIDODERM) 5 % Place 3 patches onto the skin daily for 30 days Apply patch to the most painful area for up to 12 hours in a 24 hour period. Patient not taking: Reported on 11/22/2020 11/18/20     loratadine (CLARITIN) 10 MG tablet Take 1 tablet (10 mg total) by mouth once daily Patient not taking: Reported on 11/22/2020 07/18/20     loratadine (CLARITIN) 10 MG tablet Take 1 tablet (10 mg total) by mouth once daily Patient not taking: Reported on 11/22/2020 10/06/20     ondansetron (ZOFRAN) 8 MG tablet Take 1 tablet (8 mg total) by mouth every 12 (twelve) hours as needed for nausea or vomiting Patient not taking: Reported on 11/22/2020 09/13/20     oxyCODONE (OXYCONTIN) 20 mg 12 hr tablet Take 1 tablet (20 mg total) by mouth every 12 (twelve) hours (hold for sedation) Patient not taking: Reported on 11/22/2020 10/06/20     oxyCODONE (OXYCONTIN) 40 mg 12 hr tablet Take 1 tablet (40 mg total) by mouth every 12 (twelve) hours for 30 days Patient not taking: Reported on 11/22/2020 10/20/20     oxyCODONE (ROXICODONE) 15 MG immediate release tablet Take 1 tablet by mouth every 3 hours as needed for pain Patient not taking: Reported on 11/22/2020 10/31/20     Oxycodone HCl 10 MG TABS Take 1 to 1and 1/2 tablets (10 - 15 mg total) by mouth every 3 hours as needed for pain Patient not taking: Reported on 11/22/2020 10/20/20     pantoprazole (PROTONIX) 40 MG tablet TAKE 1 TABLET BY MOUTH ONCE A DAY Patient not taking: Reported on 11/22/2020 02/08/20 02/07/21  Marletta Lor   Allergies  Allergen Reactions   Morphine And Related Hives   Hydrocodone-Acetaminophen Nausea Only   Vancomycin Other (See Comments)    Redman's Syndrome   Review of Systems Patient complains of pain in her chest area, down her spine.  Physical Exam Awake alert resting in bed Appears with generalized pain and weakness No focal deficits Answers  questions appropriately Does not have edema Has dressings over her chest wall  Vital Signs: BP (!) 97/56 (BP Location: Right Leg)   Pulse 100   Temp 97.9 F (36.6 C) (Oral)   Resp 17   Ht 5\' 5"  (1.651 m)   Wt 64.4 kg   SpO2 95%   BMI 23.63 kg/m  Pain Scale: 0-10 POSS *See Group Information*: 1-Acceptable,Awake and alert Pain Score: 9    SpO2: SpO2: 95 % O2 Device:SpO2: 95 % O2 Flow Rate: .O2 Flow Rate (L/min): 2 L/min  IO: Intake/output summary:  Intake/Output Summary (Last 24 hours) at 11/29/2020 1213 Last data filed at 11/29/2020 0943 Gross per 24 hour  Intake 1922.06 ml  Output 500 ml  Net 1422.06 ml    LBM: Last BM Date: 11/24/20 Baseline Weight: Weight: 64.4 kg Most recent weight: Weight: 64.4 kg     Palliative Assessment/Data:   PPS 50%  Time In:  11 Time Out:  12 Time Total:  60  Greater than 50%  of this time was spent counseling and coordinating care related to the above assessment and plan.  Signed by: Loistine Chance, MD   Please contact Palliative Medicine Team phone at [phone] for questions and concerns.  For individual provider: See Shea Evans

## 2020-11-29 NOTE — Progress Notes (Signed)
PROGRESS NOTE    Kara Pittman  IRC:[id number] DOB: 16-Apr-1965 DOA: 11/22/2020 PCP: [name], MD    Brief Narrative:  Kara Pittman was admitted to the hospital with the working diagnosis of sepsis due to post obstructive pneumonia/ infected right chest wound.  Patient with large tumor burden, plan to continue care with hospice services.    55 year old female past medical history for metastatic breast cancer, hypothyroidism, chronic pain syndrome, GERD and depression who presented not feeling well.  Patient reported several days of coughing, patient had fever, nausea and weakness.  Recent hospitalization at [facility] about 18 days ago for similar presentation, discharged 10/14 on analgesic regimen.  Apparently infection was ruled out, fevers were presumed to be related to malignancy (large tumor burden).  She did not received any antibiotic therapy.   Her temperature on admission 98.7, heart rate 123, blood pressure 98/73, oxygen saturation 85% on room air, her lungs are clear to auscultation bilaterally, heart S1-S2, present, rhythmic, her abdomen was distended, no lower extremity edema.   Sodium 129, potassium 4.1, chloride 95, bicarb 25, glucose 114, BUN 16, creatinine 0.96, white count 22.3, hemoglobin 8.2, hematocrit 25.9, platelets 108. SARS COVID-19 negative.   Urinalysis specific gravity 1.012, 0 having 5 white cells.   Chest radiograph with large round opacities right lower lobe and left upper lobe.   EKG 120 bpm, normal axis, normal intervals, sinus rhythm, no significant ST segment or T wave changes, low voltage.   Patient placed on broad spectrum antibiotic therapy and supportive medical care. Improvement of fever but continue to have severe and persistent pain at her right hip region.    Patient with persistent fever and leukocytosis, added antifungal therapy with fluconazole.  Continue to have fever and leukocytosis despite broad spectrum antibiotic therapy, likely  component of malignancy related fever and leukocytosis.    Patient with poor prognosis continue to have pain. Code status changed to DNR and palliative care team has been consulted.   Antibiotic therapy have been discontinue. Consulted social work to consult hospice services.   Assessment & Plan:   Principal Problem:   Pneumonia (post obstructive pneumonia due to pulmonary metastasis) Active Problems:   Stage IV breast cancer in female [facility])   Open wound of right chest wall   Anemia of chronic disease   Pulmonary embolism ([facility])   Sepsis ([facility])   Hypothyroid     Sepsis due to post obstructive pneumonia with acute hypoxemic respiratory failure.   Patient has a radiation induced skin lesion on her left/ right breast, with ulcerated wound on the right, with positive secretion, that can not ruled out to be infected.    Despite triple antibiotic therapy including antifungal patient continue to have fever and leukocytosis, likely has component of malignancy related systemic inflammatory response syndrome.    Plan to discontinue antibiotic therapy with Vancomycin, Cefepime and Fluconazole.   I spoke with Kara Pittman and her mother at the bedside, explained that will discontinue antibiotics, likely her fever spikes and leukocytosis are related to malignancy.  Patient will need further supportive care at discharge, she is agreement to consult hospice to continue care at discharge.    2. Hyponatremia, hypokalemia. SIADH  Continue to have hyponatremia with Na down to 129, K is 4,4 and serum cr at 0,76. Patient has been on salt tablets.  Patient with poor prognosis. Positive third spacing, will discontinue salt tablets for now. Hold on further blood work for now.  Continue to encourage po intake.  3. 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  She had a right mastectomy May 2021  followed by multiple rounds of chemotherapy and immunotherapy.  Unfortunately developed widespread metastasis to the chest wall, lungs, liver, adrenals and bone.  Severe pain right chest, axilla. In August 2022 she was diagnosed with pulmonary embolism.   This am her hgb is down to 6,7 and hct 21,7.  Had one unit of PRBC order this am to make a total of 2 since admission.    Continue pain control with methadone 15 mg tid, and break through pain control with hydromorphone po 8 mg.  Gabapentin 300 mg po tid.  Follow with palliative care recommendations   4. Hypothyroid.  Continue with levothyroxine   5. Depression.  Continue with bupropion    6. Pulmonary embolism.  Because high risk for bleeding will discontinue apixaban     Patient continue to be at high risk for  worsening malignancy   Status is: Inpatient  Remains inpatient appropriate because: Uncontrolled pain   DVT prophylaxis: Scd   Code Status:   DNR   Family Communication:  I spoke with patient's mother at the bedside, we talked in detail about patient's condition, plan of care and prognosis and all questions were addressed.   Nutrition Status: Nutrition Problem: Increased nutrient needs Etiology: cancer and cancer related treatments Signs/Symptoms: estimated needs Interventions: MVI, Premier Protein     Consultants:  Palliative care   Subjective: Patient continue to be very weak and deconditioned, no nausea or vomiting,   Objective: Vitals:   11/29/20 0913 11/29/20 0955 11/29/20 1015 11/29/20 1205  BP: 113/70 129/62 117/62 (!) 97/56  Pulse: 97 91 96 100  Resp: 16 17 16 17   Temp:  97.6 F (36.4 C) 97.7 F (36.5 C) 97.9 F (36.6 C)  TempSrc:   Oral Oral  SpO2: 100% 100%  95%  Weight:      Height:        Intake/Output Summary (Last 24 hours) at 11/29/2020 1322 Last data filed at 11/29/2020 2536 Gross per 24 hour  Intake 1922.06 ml  Output 500 ml  Net 1422.06 ml   Filed Weights   11/22/20 1231   Weight: 64.4 kg    Examination:   General: deconditioned and ill looking appearing  Neurology: Awake  E ENT: positive pallor, no icterus, oral mucosa moist Cardiovascular: No JVD. S1-S2 present, rhythmic, no gallops, rubs, or murmurs. No lower extremity edema. Pulmonary: positive breath sounds bilaterally, adequate air movement, no wheezing, rhonchi or rales. Gastrointestinal. Abdomen soft and non tender Skin. Extensive radiation skin injury with ulceration right chest and right arm. Left breast.  Musculoskeletal: no joint deformities          Data Reviewed: I have personally reviewed following labs and imaging studies  CBC: Recent Labs  Lab 11/24/20 0354 11/25/20 0447 11/26/20 0218 11/26/20 2031 11/27/20 0314 11/28/20 0322 11/29/20 0341  WBC 14.1* 16.6* 18.7* 19.2* 11.7* 16.8* 16.8*  NEUTROABS 12.9* 15.2*  --   --   --   --   --   HGB 7.7* 7.8* 7.5* 7.7* 7.0* 7.0* 6.7*  HCT 24.8* 25.2* 24.5* 24.6* 22.8* 22.9* 21.7*  MCV 99.2 100.0 100.8* 99.2 101.3* 102.7* 101.9*  PLT 70* 66* 65* 66* 57* 57* 51*   Basic Metabolic Panel: Recent Labs  Lab 11/25/20 0447 11/26/20 0218 11/27/20 0314 11/28/20 0322 11/29/20 0341  NA 129* 127* 132* 130* 129*  K 3.5 3.3* 3.0* 4.1 4.0  CL 101 97* 102 102 99  CO2 21* 20* 24 24 22   GLUCOSE 114* 90 128* 103* 111*  BUN 9 9 17 13 15   CREATININE 0.63 0.66 0.77 0.67 0.76  CALCIUM 6.6* 6.9* 7.1* 7.2* 7.4*   GFR: Estimated Creatinine Clearance: 69.8 mL/min (by C-G formula based on SCr of 0.76 mg/dL). Liver Function Tests: Recent Labs  Lab 11/23/20 0431  AST 10*  ALT 13  ALKPHOS 103  BILITOT 0.5  PROT 5.6*  ALBUMIN 1.8*   No results for input(s): LIPASE, AMYLASE in the last 168 hours. No results for input(s): AMMONIA in the last 168 hours. Coagulation Profile: No results for input(s): INR, PROTIME in the last 168 hours. Cardiac Enzymes: No results for input(s): CKTOTAL, CKMB, CKMBINDEX, TROPONINI in the last 168 hours. BNP  (last 3 results) No results for input(s): PROBNP in the last 8760 hours. HbA1C: No results for input(s): HGBA1C in the last 72 hours. CBG: No results for input(s): GLUCAP in the last 168 hours. Lipid Profile: No results for input(s): CHOL, HDL, LDLCALC, TRIG, CHOLHDL, LDLDIRECT in the last 72 hours. Thyroid Function Tests: No results for input(s): TSH, T4TOTAL, FREET4, T3FREE, THYROIDAB in the last 72 hours. Anemia Panel: No results for input(s): VITAMINB12, FOLATE, FERRITIN, TIBC, IRON, RETICCTPCT in the last 72 hours.    Radiology Studies: I have reviewed all of the imaging during this hospital visit personally     Scheduled Meds:  buPROPion  150 mg Oral Daily   Chlorhexidine Gluconate Cloth  6 each Topical Daily   gabapentin  300 mg Oral TID   levothyroxine  75 mcg Oral QAC breakfast   lidocaine  3 patch Transdermal Daily   methadone  15 mg Oral Q8H   multivitamin with minerals  1 tablet Oral Daily   polyethylene glycol  17 g Oral Daily   Ensure Max Protein  11 oz Oral BID   sodium chloride flush  10-40 mL Intracatheter Q12H   sodium chloride  1 g Oral TID WC   Continuous Infusions:  promethazine (PHENERGAN) injection (IM or IVPB) 12.5 mg (11/26/20 1023)     LOS: 7 days        Oleda Borski Gerome Apley, MD

## 2020-11-29 NOTE — Progress Notes (Signed)
Date and time results received: 11/29/20 0404 am  (use smartphrase ".now" to insert current time)  Test: hgb  Critical Value: hgb 6.7  Name of Provider Notified: Clarene Essex, NP  Orders Received? Or Actions Taken?:

## 2020-11-30 DIAGNOSIS — D638 Anemia in other chronic diseases classified elsewhere: Secondary | ICD-10-CM | POA: Diagnosis not present

## 2020-11-30 DIAGNOSIS — A419 Sepsis, unspecified organism: Secondary | ICD-10-CM | POA: Diagnosis not present

## 2020-11-30 DIAGNOSIS — C50919 Malignant neoplasm of unspecified site of unspecified female breast: Secondary | ICD-10-CM | POA: Diagnosis not present

## 2020-11-30 DIAGNOSIS — R6520 Severe sepsis without septic shock: Secondary | ICD-10-CM

## 2020-11-30 DIAGNOSIS — Z515 Encounter for palliative care: Secondary | ICD-10-CM | POA: Diagnosis not present

## 2020-11-30 DIAGNOSIS — D696 Thrombocytopenia, unspecified: Secondary | ICD-10-CM | POA: Diagnosis present

## 2020-11-30 DIAGNOSIS — E876 Hypokalemia: Secondary | ICD-10-CM

## 2020-11-30 DIAGNOSIS — E871 Hypo-osmolality and hyponatremia: Secondary | ICD-10-CM | POA: Diagnosis not present

## 2020-11-30 LAB — CBC
HCT: 22.2 % — ABNORMAL LOW (ref 36.0–46.0)
Hemoglobin: 6.8 g/dL — CL (ref 12.0–15.0)
MCH: 29.8 pg (ref 26.0–34.0)
MCHC: 30.6 g/dL (ref 30.0–36.0)
MCV: 97.4 fL (ref 80.0–100.0)
Platelets: 40 10*3/uL — ABNORMAL LOW (ref 150–400)
RBC: 2.28 MIL/uL — ABNORMAL LOW (ref 3.87–5.11)
RDW: 20.8 % — ABNORMAL HIGH (ref 11.5–15.5)
WBC: 14.3 10*3/uL — ABNORMAL HIGH (ref 4.0–10.5)
nRBC: 0 % (ref 0.0–0.2)

## 2020-11-30 MED ORDER — METHADONE HCL 10 MG PO TABS
10.0000 mg | ORAL_TABLET | Freq: Three times a day (TID) | ORAL | Status: DC
  Administered 2020-11-30 – 2020-12-03 (×10): 10 mg via ORAL
  Filled 2020-11-30 (×10): qty 1

## 2020-11-30 NOTE — Assessment & Plan Note (Signed)
Resolved

## 2020-11-30 NOTE — Progress Notes (Signed)
Daily Progress Note   Patient Name: Kara Pittman       Date: 11/30/2020 DOB: 1965-06-02  Age: 55 y.o. MRN#: [id number] Attending Physician: Edwin Dada, * Primary Care Physician: [name], MD Admit Date: 11/22/2020  Reason for Consultation/Follow-up: Establishing goals of care and Pain control  Subjective:  Resting in bed, fiance at bedside. Currently pain reasonably well controlled.   Length of Stay: 8  Current Medications: Scheduled Meds:  . buPROPion  150 mg Oral Daily  . Chlorhexidine Gluconate Cloth  6 each Topical Daily  . gabapentin  300 mg Oral TID  . levothyroxine  75 mcg Oral QAC breakfast  . lidocaine  3 patch Transdermal Daily  . methadone  15 mg Oral Q8H  . multivitamin with minerals  1 tablet Oral Daily  . polyethylene glycol  17 g Oral Daily  . Ensure Max Protein  11 oz Oral BID  . sodium chloride flush  10-40 mL Intracatheter Q12H    Continuous Infusions: . promethazine (PHENERGAN) injection (IM or IVPB) 12.5 mg (11/26/20 1023)    PRN Meds: acetaminophen, HYDROmorphone (DILAUDID) injection, HYDROmorphone (DILAUDID) injection, HYDROmorphone, ipratropium-albuterol, lidocaine, methocarbamol, ondansetron **OR** ondansetron (ZOFRAN) IV, promethazine (PHENERGAN) injection (IM or IVPB), sodium chloride flush  Physical Exam         Awake alert No distress Chest wall with dressing Regular work of breathing  Vital Signs: BP 122/68 (BP Location: Left Leg)   Pulse (!) 114   Temp 98.1 F (36.7 C) (Oral)   Resp 15   Ht 5\' 5"  (1.651 m)   Wt 64.4 kg   SpO2 98%   BMI 23.63 kg/m  SpO2: SpO2: 98 % O2 Device: O2 Device: Nasal Cannula O2 Flow Rate: O2 Flow Rate (L/min): 2 L/min  Intake/output summary:  Intake/Output Summary (Last 24 hours) at  11/30/2020 1051 Last data filed at 11/30/2020 0093 Gross per 24 hour  Intake 1080 ml  Output 1600 ml  Net -520 ml   LBM: Last BM Date: 11/24/20 Baseline Weight: Weight: 64.4 kg Most recent weight: Weight: 64.4 kg       Palliative Assessment/Data:      Patient Active Problem List   Diagnosis Date Noted  . Hyponatremia 11/30/2020  . Thrombocytopenia ([place]) 11/30/2020  . Pneumonia (post obstructive pneumonia due to pulmonary metastasis) 11/26/2020  .  Hypothyroid 11/26/2020  . Severe sepsis ([place]) 11/22/2020  . Acute respiratory failure with hypoxia ([place]) 11/22/2020  . Healthcare-associated pneumonia 11/22/2020  . Pulmonary embolism ([place]) 10/01/2020  . Hypokalemia 10/01/2020  . Stage IV breast cancer in female [facility])   . Open wound of right chest wall   . Dehydration   . Cancer related pain   . Leukocytosis   . Anemia of chronic disease   . Debility   . Sepsis due to gram-negative UTI ([place]) 09/27/2019  . Cancer of overlapping sites of right female breast ([place]) 04/21/2019  . Port-A-Cath in place 03/26/2019  . Genetic testing 12/23/2018  . Family history of leukemia   . Family history of throat cancer   . Family history of skin cancer   . Family history of lung cancer   . Malignant neoplasm of lower-inner quadrant of right breast of female, estrogen receptor negative ([place]) 12/12/2018  . Screening breast examination 12/04/2018  . Breast mass, right 12/01/2018  . Grief 05/06/2018  . Menopausal symptom 07/04/2017  . Colon cancer screening 07/04/2017  . Encounter for preventative adult health care examination 07/04/2017  . Family history of coronary arteriosclerosis 07/04/2017  . Vitamin D deficiency 05/17/2016  . Screening cholesterol level 05/17/2016  . Personal history of malignant melanoma of skin 05/17/2016  . 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 Hypothyroidism 01/21/2015  . Melanoma of scalp ([place]) 01/17/2015  . RHINITIS, ALLERGIC  04/04/2006  . CERVICAL SPINE DISORDER, NOS 04/04/2006    Palliative Care Assessment & Plan   Patient Profile:  55 year old lady with metastatic breast cancer, hypothyroidism, chronic pain syndrome GERD and depression.  Patient has had her oncologic care here at [facility] long and more recently over at [facility] as well.  She was also seeing palliative practitioners at [facility], chart reviewed extensively.  Patient was recently hospitalized at [facility] and was discharged on analgesic regimen.  Infection was ruled out.  Fevers were presumed to be related to malignancy and large tumor burden.  Patient admitted to hospital medicine service with a working diagnosis of sepsis due to postobstructive pneumonia, acute hypoxic respiratory failure as well as electrolyte abnormalities such as hyponatremia and hypokalemia. 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  Has been through multiple rounds of chemotherapy and immunotherapy.  Has evidence of widespread metastases to chest wall, lungs liver adrenals and bone.  Also has a history of pulmonary embolism.  Assessment:  Generalized pain fatigue  Recommendations/Plan:  Patient and Fiance state that the Lidocaine patches have helped with her pain. She remains on PO methadone, very rarely requiring breakthrough pain medication.  We discussed about goals of care and disposition options. Discussed about differences between hospice and palliative care. Home with home health, home based palliative support versus home with hospice were discussed. Patient and fiance wish to further discuss with TOC regarding exploring the option of going home with hospice support. As per fiance, patient would likely discharge to her mother's house with hospice support towards the end of this hospitalization, recommend TOC support.     Code Status:    Code Status Orders   (From admission, onward)           Start     Ordered   11/28/20 1222  Do not attempt resuscitation (DNR)  Continuous       Question Answer Comment  In the event of cardiac  or respiratory ARREST Do not call a "code blue"   In the event of cardiac or respiratory ARREST Do not perform Intubation, CPR, defibrillation or ACLS   In the event of cardiac or respiratory ARREST Use medication by any route, position, wound care, and other measures to relive pain and suffering. May use oxygen, suction and manual treatment of airway obstruction as needed for comfort.      11/28/20 1221           Code Status History     Date Active Date Inactive Code Status Order ID Comments User Context   11/22/2020 2028 11/28/2020 1221 Full Code [id number]  Mariel Aloe, MD Inpatient   10/01/2020 1755 10/03/2020 2208 Full Code [id number]  Charlann Lange, MD ED   09/27/2019 1721 10/02/2019 1834 Full Code [id number]  Mercy Riding, MD Inpatient   04/21/2019 1452 04/22/2019 1917 Full Code [id number]  [name], MD Inpatient       Prognosis:  < 6 months  Discharge Planning: Home with Hospice  Care plan was discussed with  patient and Fiance.   Thank you for allowing the Palliative Medicine Team to assist in the care of this patient.   Time In: 10 Time Out: 10.25 Total Time 25 Prolonged Time Billed  no       Greater than 50%  of this time was spent counseling and coordinating care related to the above assessment and plan.  Loistine Chance, MD  Please contact Palliative Medicine Team phone at [phone] for questions and concerns.

## 2020-11-30 NOTE — Assessment & Plan Note (Signed)
No change 

## 2020-11-30 NOTE — Progress Notes (Addendum)
[facility]) Hospital Liaison Note   Received request from Transitions of Care Manage, Tobi Bastos, LCSW, for hospice services at home after discharge. Chart and patient information under review by [facility] physician. Hospice eligibility is approved.   Spoke with Ms. Speelman and her mother/[place] at bedside to initiate education related to hospice philosophy, services, and team approach to care. Patient/family verbalized understanding of information given. Per discussion, the plan is for patient to discharge home via private vehicle once medically clear.   DME needs discussed. Patient has the following equipment in the home: bedside commode from a family member. Patient requests the following equipment for delivery:  O2 @ 2L  WC--lightweight   Shower bench   Over the bed table  [employer] supplies  [employer] verified and is: [address] [place], [place] [address]. Mother/[place]ia [phone] is the family member to contact to arrange time of equipment delivery.    Please send signed and completed DNR home with patient/family. Please provide prescriptions at discharge as needed to ensure ongoing symptom management.    [facility] information and contact numbers given to Mother/[place]ia [phone] Above information shared with Megan, TOC.    Please call with any questions/concerns.    Thank you for the opportunity to participate in this patient's care.   Daphene Calamity, MSW [facility] Liaison  [phone]

## 2020-11-30 NOTE — Progress Notes (Signed)
Progress Note    ANALY BASSFORD   TFT:[id number]  DOB: 12-29-1965  DOA: 11/22/2020     8 Date of Service: 11/30/2020     Brief summary: 55 year old female past medical history for metastatic breast cancer, hypothyroidism, chronic pain syndrome, GERD and depression who presented not feeling well.  Patient reported several days of coughing, patient had fever, nausea and weakness.  Recent hospitalization at [facility] about 18 days ago for similar presentation, discharged 10/14 on analgesic regimen.  Apparently infection was ruled out, fevers were presumed to be related to malignancy (large tumor burden).     Her temperature on admission 98.7, heart rate 123, blood pressure 98/73, oxygen saturation 85% on room air Chest radiograph with large round opacities right lower lobe and left upper lobe.    Patient placed on broad spectrum antibiotic therapy           Assessment and Plan * Severe sepsis ([place]) Presumed respiratory source given imaging findings of opacities and CT chest.  Blood and urine cultures unremarkable.  No other clinical source of infection noted.  Completed 6 days of vancomycin and cefepime.  Pneumonia (post obstructive pneumonia due to pulmonary metastasis) See above  Stage IV breast cancer in female [facility]) Clinically unfortunately she has not much improved with antibiotics, fluids, blood transfusion.  Her functional status appears worse.  I discussed this with her primary oncologist, Dr. Wetzel Bjornstad, who agreed with recommendations for hospice, felt that the patient would need considerable improvement in her functional status (which is not impossible, but appearing less and less likely) for further chemotherapy to be of any benefit. - Recommend hospice  -Continue methadone, reduce BuSpar to 10 mg 3 times daily given her somnolence - Robaxin and hydromorphone only as needed - Continue gabapentin at reduced dose - Continue bupropion  Pulmonary embolism (HCC) -  Anticoagulation has been stopped  Hyponatremia    Hypokalemia Resolved  Open wound of right chest wall    Thrombocytopenia (HCC) No change  Hypothyroid - Continue levothyroxine  Anemia of chronic disease No change     Subjective:  No new fever, more somnolent today.  No respiratory distress.  Her pain is controlled on her current regimen.  Objective Vitals:   11/30/20 0635 11/30/20 1024 11/30/20 1414 11/30/20 1841  BP: 131/60 122/68 132/68 124/63  Pulse: (!) 115 (!) 114 (!) 112 (!) 118  Resp: 18 15 16 16   Temp: 98.7 F (37.1 C) 98.1 F (36.7 C) 98.4 F (36.9 C)   TempSrc: Oral Oral Oral   SpO2: 99% 98% 99% 97%  Weight:      Height:       64.4 kg  Vital signs were reviewed and unremarkable except for: Tachycardia   Exam General appearance: Chronically ill-appearing adult female, lying in bed, no obvious distress, somewhat somnolent but interactive.     HEENT: Icteric, conjunctival pink, lids and lashes normal.  No nasal forming, discharge, or epistaxis. Skin: She has numerous ulcerated lesions on the axilla, chest, and right arm.  These are not all examined Cardiac: Tachycardic, regular, no murmurs.  No lower extremity edema. Respiratory: Lung sounds diminished, respiratory rate normal.  No rales. Abdomen:   MSK: Abdomen soft no tenderness palpation or guarding, no ascites or distention. Neuro: Awake but somnolent, extraocular movements intact, face symmetric, no focal weakness, speech fluent Psych: Attention diminished, affect blunted, judgment insight appear normal    Labs / Other Information There are no new results to review at this time.  Disposition Plan: Status is: Inpatient  Remains inpatient appropriate because: Severe end-stage cancer, transitioning to hospice.  Regimen still in planning phase.        Time spent: 35 minutes [facility] 11/30/2020, 7:36 PM

## 2020-11-30 NOTE — Assessment & Plan Note (Signed)
-   Anticoagulation has been stopped

## 2020-11-30 NOTE — Assessment & Plan Note (Signed)
Presumed respiratory source given imaging findings of opacities and CT chest.  Blood and urine cultures unremarkable.  No other clinical source of infection noted.  Completed 6 days of vancomycin and cefepime.

## 2020-11-30 NOTE — Assessment & Plan Note (Signed)
See above

## 2020-11-30 NOTE — Hospital Course (Addendum)
Kara Pittman is a 55 y.o. F with metastatic BrCA, hypothyroidism who presented for tachycardia, malaise, and weakness.    In the ER, chest imaging showed bilateral opacities and she was hypoxic.    She was initially admitted on broad spectrum intravenous antibiotics and IV fluids.  Although her hemodynamics improved initially with fluids, her fevers recurred, despite treatment and then her mental status began to worsen, and she developed anemia refractory to transfusion.    The case was discussed with her primary Oncologist at [facility], and family and patient met with Palliative care and elected to enroll in Hospice.  Her mentation and functional status worsened, and the choice was made to transfer to residential Hospice.

## 2020-11-30 NOTE — TOC Progression Note (Signed)
Transition of Care [facility]) - Progression Note   Patient Details  Name: Kara Pittman MRN: [id number] Date of Birth: 1965/11/04  Transition of Care [facility]) CM/SW [place], LCSW Phone Number: 11/30/2020, 11:57 AM  Clinical Narrative: [facility] consulted for hospice referral. CSW met with patient, mother, and significant other to discuss referral. Patient and family are agreeable to referral to [place]. CSW made referral to Jhonnie Garner with [facility] and requested that someone meet with the patient today to address questions and concerns. Hospice to determine DME needs. TOC to follow.  Expected Discharge Plan: Home w Hospice Care Barriers to Discharge: Continued Medical Work up  Expected Discharge Plan and Services Expected Discharge Plan: [place] In-house Referral: Clinical Social Work, Hospice / [place] Acute Care Choice: Hospice Living arrangements for the past 2 months: Single Family Home           DME Arranged: N/A DME Agency: NA  Readmission Risk Interventions Readmission Risk Prevention Plan 11/24/2020  Transportation Screening Complete  Medication Review Press photographer) Complete  PCP or Specialist appointment within 3-5 days of discharge Not Complete  PCP/Specialist Appt Not Complete comments Patient will discharge home with hospice services  SW Recovery Care/Counseling Consult Complete  Palliative Care Screening Not [place] Not Applicable  Some recent data might be hidden

## 2020-11-30 NOTE — Progress Notes (Signed)
Pt's husband preferred to have his wife's wound dressing change performed during dayshift.

## 2020-11-30 NOTE — Assessment & Plan Note (Signed)
Continue levothyroxine 

## 2020-11-30 NOTE — Assessment & Plan Note (Addendum)
Clinically unfortunately she has not much improved with antibiotics, fluids, blood transfusion.  Her functional status appears worse.  I discussed this with her primary oncologist, Dr. Wetzel Bjornstad, who agreed with recommendations for hospice, felt that the patient would need considerable improvement in her functional status (which is not impossible, but appearing less and less likely) for further chemotherapy to be of any benefit. - Recommend hospice  -Continue methadone, reduce BuSpar to 10 mg 3 times daily given her somnolence - Robaxin and hydromorphone only as needed - Continue gabapentin at reduced dose - Continue bupropion

## 2020-12-01 DIAGNOSIS — A419 Sepsis, unspecified organism: Secondary | ICD-10-CM | POA: Diagnosis not present

## 2020-12-01 DIAGNOSIS — R6520 Severe sepsis without septic shock: Secondary | ICD-10-CM | POA: Diagnosis not present

## 2020-12-01 DIAGNOSIS — Z515 Encounter for palliative care: Secondary | ICD-10-CM | POA: Diagnosis not present

## 2020-12-01 DIAGNOSIS — E871 Hypo-osmolality and hyponatremia: Secondary | ICD-10-CM | POA: Diagnosis not present

## 2020-12-01 DIAGNOSIS — C50919 Malignant neoplasm of unspecified site of unspecified female breast: Secondary | ICD-10-CM | POA: Diagnosis not present

## 2020-12-01 DIAGNOSIS — D638 Anemia in other chronic diseases classified elsewhere: Secondary | ICD-10-CM | POA: Diagnosis not present

## 2020-12-01 DIAGNOSIS — E876 Hypokalemia: Secondary | ICD-10-CM | POA: Diagnosis not present

## 2020-12-01 LAB — PREPARE RBC (CROSSMATCH)

## 2020-12-01 MED ORDER — SODIUM CHLORIDE 0.9% IV SOLUTION: Freq: Once | INTRAVENOUS | Status: AC

## 2020-12-01 MED ORDER — SENNOSIDES-DOCUSATE SODIUM 8.6-50 MG PO TABS: 1.0000 | ORAL_TABLET | Freq: Two times a day (BID) | ORAL | Status: DC | PRN

## 2020-12-01 NOTE — Assessment & Plan Note (Signed)
Clinically unfortunately she has not much improved with antibiotics, fluids, blood transfusion.  Her mentation has worsened over the last few days.  Her Hgb continues to drop despite transfusion.  Today she is almost too weak to stand.    I spoke with her primary oncologist, Dr. Wetzel Bjornstad, who was very understanding and kind.  Further erybulin at this time would likely harm her and not give meaningful benefit.    - Hospice will arrange discharge plans  - Continue methadone 10 TID - Robaxin and hydromorphone only as needed - Continue gabapentin at reduced dose - Continue bupropion

## 2020-12-01 NOTE — TOC Transition Note (Signed)
Transition of Care [facility]) - CM/SW Discharge Note  Patient Details  Name: [name] MRN: [id number] Date of Birth: 1965-07-16  Transition [facility]) CM/SW Contact:  Sherie Don, LCSW Phone Number: 12/01/2020, 2:48 PM  Clinical Narrative: CSW confirmed with Shanita with [facility] that patient's DME will be delivered to mother's home today between 2-4pm. However, patient's son is returning to town and is not in agreement with hospice. Family to meet together to discuss patient discharging home on hospice. TOC awaiting patient's final decision.  Final next level of care: Home w Hospice Care Barriers to Discharge: Barriers Resolved  Patient Goals and CMS Choice Patient states their goals for this hospitalization and ongoing recovery are:: Return home CMS Medicare.gov Compare Post Acute Care list provided to:: Patient Choice offered to / list presented to : Patient  Discharge Plan and Services In-house Referral: Clinical Social Work, Hospice / [place] Acute Care Choice: Hospice          DME Arranged:  (DME has been set up through hospice) DME Agency: NA  Readmission Risk Interventions Readmission Risk Prevention Plan 11/24/2020  Transportation Screening Complete  Medication Review Press photographer) Complete  PCP or Specialist appointment within 3-5 days of discharge Not Complete  PCP/Specialist Appt Not Complete comments Patient will discharge home with hospice services  SW Recovery Care/Counseling Consult Complete  Palliative Care Screening Not [place] Not Applicable  Some recent data might be hidden

## 2020-12-01 NOTE — Progress Notes (Signed)
  Progress Note    Kara Pittman   [id number]  DOB: [birth year]  DOA: 11/22/2020     9 Date of Service: 12/01/2020     Brief summary: Mrs. Kara Pittman is a 55 y.o. F with metastatic BrCA, hypothyroidism who presented for tachycardia, malaise, and weakness.    In the ER, Chest imaging showed bilateral opacities and she was hypoxic.    10/18: Admitted on antibiotics 10/21-10/24: Fevers recur, despite antibiotics 10/25: Mental status worsening, transfusion given, code status changed          Assessment and Plan Stage IV breast cancer in female [facility]) Clinically unfortunately she has not much improved with antibiotics, fluids, blood transfusion.  Her mentation has worsened over the last few days.  Her Hgb continues to drop despite transfusion.  Today she is almost too weak to stand.    I spoke with her primary oncologist, Dr. Wetzel Bjornstad, who was very understanding and kind.  Further erybulin at this time would likely harm her and not give meaningful benefit.    - Hospice will arrange discharge plans  - Continue methadone 10 TID - Robaxin and hydromorphone only as needed - Continue gabapentin at reduced dose - Continue bupropion  Pulmonary embolism ([place]) - Anticoagulation has been stopped  Open wound of right chest wall     Thrombocytopenia (HCC)  No change  Hypothyroid - Continue levothyroxine     Subjective:  Her mentation is worse.  She is weaker. Discussed with her mother at the bedside, fianc Kara Pittman at the bedside, and son by phone.   Objective Vitals:   12/01/20 1204 12/01/20 1212 12/01/20 1314 12/01/20 1435  BP: (!) 110/49 (!) 110/49 (!) 114/94 (!) 148/66  Pulse: (!) 106 (!) 106 (!) 111 (!) 109  Resp: $Remo'18 18 16 14  'AhhER$ Temp: 98.7 F (37.1 C) 98.7 F (37.1 C) 97.9 F (36.6 C) 98.9 F (37.2 C)  TempSrc: Oral Oral Oral Oral  SpO2: 99% 99% 97% 99%  Weight:      Height:       64.4 kg  Vital signs were reviewed and unremarkable except for:  Tachycardia   Exam General appearance: Frail ability adult female, lying in bed, sleepy     HEENT:    Skin:  Cardiac: Tachycardic, regular, soft systolic murmur, diffuse nonpitting edema Respiratory: Respirations shallow, somewhat fast, lung sounds diminished, no rales appreciated Abdomen: No tenderness palpation, abdomen soft, no grimace or guarding. MSK:  Neuro: Arouses occasionally then falls back asleep.  Upper extremity strength is extremely weak.  Speech is fluent Psych: Somnolent, attention diminished    Labs / Other Information My review of labs, imaging, notes and other tests is significant for Hemoglobin back down to 6.8, platelets unchanged     Disposition Plan: Status is: Inpatient  Remains inpatient appropriate because: The patient is at the end of the life, appears to be actively dying.  I suspect that things are accelerating.  Family are uncertain if they can care for her at home, and are working with hospice to navigate between home with equipment, and residential hospice.  I will support them in whatever they choose.        Time spent: 35 minutes [facility] 12/01/2020, 5:33 PM

## 2020-12-01 NOTE — Progress Notes (Signed)
Daily Progress Note   Patient Name: Kara Pittman       Date: 12/01/2020 DOB: 01/21/1966  Age: 55 y.o. MRN#: [id number] Attending Physician: Edwin Dada, * Primary Care Physician: [name], MD Admit Date: 11/22/2020  Reason for Consultation/Follow-up: Establishing goals of care and Pain control  Subjective:  Resting in chair, mother at bedside.    Length of Stay: 9  Current Medications: Scheduled Meds:  . sodium chloride   Intravenous Once  . buPROPion  150 mg Oral Daily  . Chlorhexidine Gluconate Cloth  6 each Topical Daily  . gabapentin  300 mg Oral TID  . levothyroxine  75 mcg Oral QAC breakfast  . lidocaine  3 patch Transdermal Daily  . methadone  10 mg Oral Q8H  . multivitamin with minerals  1 tablet Oral Daily  . polyethylene glycol  17 g Oral Daily  . Ensure Max Protein  11 oz Oral BID  . sodium chloride flush  10-40 mL Intracatheter Q12H    Continuous Infusions: . promethazine (PHENERGAN) injection (IM or IVPB) 12.5 mg (11/26/20 1023)    PRN Meds: acetaminophen, HYDROmorphone (DILAUDID) injection, HYDROmorphone (DILAUDID) injection, HYDROmorphone, ipratropium-albuterol, lidocaine, methocarbamol, ondansetron **OR** ondansetron (ZOFRAN) IV, promethazine (PHENERGAN) injection (IM or IVPB), senna-docusate, sodium chloride flush  Physical Exam         Awakens easily is alert and answers questions appropriately.  No distress Chest wall with dressing Regular work of breathing  Vital Signs: BP 103/64 (BP Location: Right Leg)   Pulse (!) 111   Temp 98.9 F (37.2 C) (Oral)   Resp 18   Ht 5\' 5"  (1.651 m)   Wt 64.4 kg   SpO2 98%   BMI 23.63 kg/m  SpO2: SpO2: 98 % O2 Device: O2 Device: Nasal Cannula O2 Flow Rate: O2 Flow Rate (L/min): 2  L/min  Intake/output summary:  Intake/Output Summary (Last 24 hours) at 12/01/2020 1107 Last data filed at 12/01/2020 1054 Gross per 24 hour  Intake 600 ml  Output 850 ml  Net -250 ml    LBM: Last BM Date: 11/24/20 Baseline Weight: Weight: 64.4 kg Most recent weight: Weight: 64.4 kg       Palliative Assessment/Data:      Patient Active Problem List   Diagnosis Date Noted  . Hyponatremia 11/30/2020  . Thrombocytopenia (  [place]) 11/30/2020  . Palliative care by specialist   . Pneumonia (post obstructive pneumonia due to pulmonary metastasis) 11/26/2020  . Hypothyroid 11/26/2020  . Severe sepsis ([place]) 11/22/2020  . Acute respiratory failure with hypoxia ([place]) 11/22/2020  . Healthcare-associated pneumonia 11/22/2020  . Pulmonary embolism ([place]) 10/01/2020  . Hypokalemia 10/01/2020  . Stage IV breast cancer in female [facility])   . Open wound of right chest wall   . Dehydration   . Cancer related pain   . Leukocytosis   . Anemia of chronic disease   . Debility   . Sepsis due to gram-negative UTI ([place]) 09/27/2019  . Cancer of overlapping sites of right female breast ([place]) 04/21/2019  . Port-A-Cath in place 03/26/2019  . Genetic testing 12/23/2018  . Family history of leukemia   . Family history of throat cancer   . Family history of skin cancer   . Family history of lung cancer   . Malignant neoplasm of lower-inner quadrant of right breast of female, estrogen receptor negative ([place]) 12/12/2018  . Screening breast examination 12/04/2018  . Breast mass, right 12/01/2018  . Grief 05/06/2018  . Menopausal symptom 07/04/2017  . Colon cancer screening 07/04/2017  . Encounter for preventative adult health care examination 07/04/2017  . Family history of coronary arteriosclerosis 07/04/2017  . Vitamin D deficiency 05/17/2016  . Screening cholesterol level 05/17/2016  . Personal history of malignant melanoma of skin 05/17/2016  . 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 Hypothyroidism 01/21/2015  . Melanoma of scalp ([place]) 01/17/2015  . RHINITIS, ALLERGIC 04/04/2006  . CERVICAL SPINE DISORDER, NOS 04/04/2006    Palliative Care Assessment & Plan   Patient Profile:  55 year old lady with metastatic breast cancer, hypothyroidism, chronic pain syndrome GERD and depression.  Patient has had her oncologic care here at [facility] long and more recently over at [facility] as well.  She was also seeing palliative practitioners at [facility], chart reviewed extensively.  Patient was recently hospitalized at [facility] and was discharged on analgesic regimen.  Infection was ruled out.  Fevers were presumed to be related to malignancy and large tumor burden.  Patient admitted to hospital medicine service with a working diagnosis of sepsis due to postobstructive pneumonia, acute hypoxic respiratory failure as well as electrolyte abnormalities such as hyponatremia and hypokalemia. 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  Has been through multiple rounds of chemotherapy and immunotherapy.  Has evidence of widespread metastases to chest wall, lungs liver adrenals and bone.  Also has a history of pulmonary embolism.  Assessment:  Generalized pain fatigue  Recommendations/Plan:  continue Lidocaine patches, agree with reduced dose of PO methadone, very rarely requiring breakthrough pain medication.  Arrangements are being made for home with hospice.    Code Status:    Code Status Orders  (From admission, onward)           Start     Ordered   11/28/20 1222  Do not attempt resuscitation (DNR)  Continuous       Question Answer Comment  In the event of cardiac or respiratory ARREST Do not call a "code blue"   In the event of cardiac or respiratory ARREST Do not perform Intubation, CPR, defibrillation or ACLS   In the event of cardiac or respiratory  ARREST Use medication by any route, position, wound care, and other measures to relive pain and  suffering. May use oxygen, suction and manual treatment of airway obstruction as needed for comfort.      11/28/20 1221           Code Status History     Date Active Date Inactive Code Status Order ID Comments User Context   11/22/2020 2028 11/28/2020 1221 Full Code [id number]  Mariel Aloe, MD Inpatient   10/01/2020 1755 10/03/2020 2208 Full Code [id number]  Charlann Lange, MD ED   09/27/2019 1721 10/02/2019 1834 Full Code [id number]  Mercy Riding, MD Inpatient   04/21/2019 1452 04/22/2019 1917 Full Code [id number]  [name], MD Inpatient       Prognosis:  < 6 months  Discharge Planning: Home with Hospice  Care plan was discussed with  patient and mother.    Thank you for allowing the Palliative Medicine Team to assist in the care of this patient.   Time In: 10 Time Out: 10.25 Total Time 25 Prolonged Time Billed  no       Greater than 50%  of this time was spent counseling and coordinating care related to the above assessment and plan.  Loistine Chance, MD  Please contact Palliative Medicine Team phone at [phone] for questions and concerns.

## 2020-12-01 NOTE — Assessment & Plan Note (Signed)
-   Anticoagulation has been stopped

## 2020-12-01 NOTE — Assessment & Plan Note (Signed)
No change 

## 2020-12-01 NOTE — Assessment & Plan Note (Signed)
Continue levothyroxine 

## 2020-12-01 NOTE — Progress Notes (Signed)
PT Cancellation Note  Patient Details Name: Kara Pittman MRN: [id number] DOB: Jun 28, 1965   Cancelled Treatment:    Reason Eval/Treat Not Completed: Fatigue/lethargy limiting ability to participate;Patient's level of consciousness (Pt lethargic and groggy. Attemtped 2x today. pt planning to go home with hospice services, will follow up as schedule allows to assess further benefits of acute PT services.)   Verner Mould, DPT Acute Rehabilitation Services Office [phone] Pager [phone]

## 2020-12-01 NOTE — Plan of Care (Signed)

## 2020-12-02 DIAGNOSIS — G9341 Metabolic encephalopathy: Secondary | ICD-10-CM | POA: Diagnosis not present

## 2020-12-02 DIAGNOSIS — A419 Sepsis, unspecified organism: Secondary | ICD-10-CM | POA: Diagnosis not present

## 2020-12-02 DIAGNOSIS — R6520 Severe sepsis without septic shock: Secondary | ICD-10-CM | POA: Diagnosis not present

## 2020-12-02 DIAGNOSIS — Z515 Encounter for palliative care: Secondary | ICD-10-CM | POA: Diagnosis not present

## 2020-12-02 DIAGNOSIS — C50919 Malignant neoplasm of unspecified site of unspecified female breast: Secondary | ICD-10-CM | POA: Diagnosis not present

## 2020-12-02 LAB — BPAM RBC
Blood Product Expiration Date: 202211202359
Blood Product Expiration Date: 202211272359
ISSUE DATE / TIME: 202210250933
ISSUE DATE / TIME: 202210271142
Unit Type and Rh: 5100
Unit Type and Rh: 5100

## 2020-12-02 LAB — TYPE AND SCREEN
ABO/RH(D): O POS
Antibody Screen: NEGATIVE
Unit division: 0
Unit division: 0

## 2020-12-02 NOTE — Progress Notes (Signed)
Daily Progress Note   Patient Name: Kara Pittman       Date: 12/02/2020 DOB: 1965-04-20  Age: 55 y.o. MRN#: [id number] Attending Physician: Edwin Dada, * Primary Care Physician: [name], MD Admit Date: 11/22/2020  Reason for Consultation/Follow-up: Establishing goals of care and Pain control  Subjective:  Resting in bed, awakens some, answers few questions appropriately. We talked about her pain and non pain symptom management, her decline trajectory and estimated prognosis. Some of these discussions were then held with only the patient's mother outside the room. See below.   Length of Stay: 10  Current Medications: Scheduled Meds:  . buPROPion  150 mg Oral Daily  . Chlorhexidine Gluconate Cloth  6 each Topical Daily  . gabapentin  300 mg Oral TID  . levothyroxine  75 mcg Oral QAC breakfast  . lidocaine  3 patch Transdermal Daily  . methadone  10 mg Oral Q8H  . multivitamin with minerals  1 tablet Oral Daily  . polyethylene glycol  17 g Oral Daily  . Ensure Max Protein  11 oz Oral BID  . sodium chloride flush  10-40 mL Intracatheter Q12H    Continuous Infusions: . promethazine (PHENERGAN) injection (IM or IVPB) 12.5 mg (11/26/20 1023)    PRN Meds: acetaminophen, HYDROmorphone (DILAUDID) injection, HYDROmorphone (DILAUDID) injection, HYDROmorphone, ipratropium-albuterol, lidocaine, methocarbamol, ondansetron **OR** ondansetron (ZOFRAN) IV, promethazine (PHENERGAN) injection (IM or IVPB), senna-docusate, sodium chloride flush  Physical Exam         Appears with ongoing generalized weakness and gradual progressive decline  Complains of pain No distress Chest wall with dressing Regular work of breathing  Vital Signs: BP 101/71 (BP Location: Left Leg)    Pulse (!) 108   Temp 97.8 F (36.6 C) (Oral)   Resp 18   Ht 5\' 5"  (1.651 m)   Wt 64.4 kg   SpO2 97%   BMI 23.63 kg/m  SpO2: SpO2: 97 % O2 Device: O2 Device: Nasal Cannula O2 Flow Rate: O2 Flow Rate (L/min): 2 L/min  Intake/output summary:  Intake/Output Summary (Last 24 hours) at 12/02/2020 1402 Last data filed at 12/02/2020 1000 Gross per 24 hour  Intake 1346 ml  Output 1050 ml  Net 296 ml    LBM: Last BM Date: 11/24/20 Baseline Weight: Weight: 64.4 kg Most recent weight: Weight:  64.4 kg       Palliative Assessment/Data:      Patient Active Problem List   Diagnosis Date Noted  . Hyponatremia 11/30/2020  . Thrombocytopenia ([place]) 11/30/2020  . Palliative care by specialist   . Pneumonia (post obstructive pneumonia due to pulmonary metastasis) 11/26/2020  . Hypothyroid 11/26/2020  . Severe sepsis ([place]) 11/22/2020  . Acute respiratory failure with hypoxia ([place]) 11/22/2020  . Healthcare-associated pneumonia 11/22/2020  . Pulmonary embolism ([place]) 10/01/2020  . Hypokalemia 10/01/2020  . Stage IV breast cancer in female [facility])   . Open wound of right chest wall   . Dehydration   . Cancer related pain   . Leukocytosis   . Anemia of chronic disease   . Debility   . Sepsis due to gram-negative UTI ([place]) 09/27/2019  . Cancer of overlapping sites of right female breast ([place]) 04/21/2019  . Port-A-Cath in place 03/26/2019  . Genetic testing 12/23/2018  . Family history of leukemia   . Family history of throat cancer   . Family history of skin cancer   . Family history of lung cancer   . Malignant neoplasm of lower-inner quadrant of right breast of female, estrogen receptor negative ([place]) 12/12/2018  . Screening breast examination 12/04/2018  . Breast mass, right 12/01/2018  . Grief 05/06/2018  . Menopausal symptom 07/04/2017  . Colon cancer screening 07/04/2017  . Encounter for preventative adult health care examination 07/04/2017  . Family history of coronary  arteriosclerosis 07/04/2017  . Vitamin D deficiency 05/17/2016  . Screening cholesterol level 05/17/2016  . Personal history of malignant melanoma of skin 05/17/2016  . 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 Hypothyroidism 01/21/2015  . Melanoma of scalp ([place]) 01/17/2015  . RHINITIS, ALLERGIC 04/04/2006  . CERVICAL SPINE DISORDER, NOS 04/04/2006    Palliative Care Assessment & Plan   Patient Profile:  55 year old lady with metastatic breast cancer, hypothyroidism, chronic pain syndrome GERD and depression.  Patient has had her oncologic care here at [facility] long and more recently over at [facility] as well.  She was also seeing palliative practitioners at [facility], chart reviewed extensively.  Patient was recently hospitalized at [facility] and was discharged on analgesic regimen.  Infection was ruled out.  Fevers were presumed to be related to malignancy and large tumor burden.  Patient admitted to hospital medicine service with a working diagnosis of sepsis due to postobstructive pneumonia, acute hypoxic respiratory failure as well as electrolyte abnormalities such as hyponatremia and hypokalemia. 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  Has been through multiple rounds of chemotherapy and immunotherapy.  Has evidence of widespread metastases to chest wall, lungs liver adrenals and bone.  Also has a history of pulmonary embolism.  Assessment:  Generalized pain fatigue  Recommendations/Plan:  continue Lidocaine patches, agree with reduced dose of PO methadone, very rarely requiring breakthrough pain medication.  We discussed about home with hospice versus residential hospice. [name] is tearful and talks about the losses and deaths they have had to endure as a family. Patient's pain and non pain symptom management needs are only escalating, oral  intake is essentially minimal to nil. Prognosis could be 2 weeks or less in my opinion. Would recommend residential hospice and patient's mother is agreeable.      Code Status:    Code Status Orders  (From admission, onward)  Start     Ordered   11/28/20 1222  Do not attempt resuscitation (DNR)  Continuous       Question Answer Comment  In the event of cardiac or respiratory ARREST Do not call a "code blue"   In the event of cardiac or respiratory ARREST Do not perform Intubation, CPR, defibrillation or ACLS   In the event of cardiac or respiratory ARREST Use medication by any route, position, wound care, and other measures to relive pain and suffering. May use oxygen, suction and manual treatment of airway obstruction as needed for comfort.      11/28/20 1221           Code Status History     Date Active Date Inactive Code Status Order ID Comments User Context   11/22/2020 2028 11/28/2020 1221 Full Code [id number]  Mariel Aloe, MD Inpatient   10/01/2020 1755 10/03/2020 2208 Full Code [id number]  Charlann Lange, MD ED   09/27/2019 1721 10/02/2019 1834 Full Code [id number]  Mercy Riding, MD Inpatient   04/21/2019 1452 04/22/2019 1917 Full Code [id number]  [name], MD Inpatient       Prognosis:  Less than 2 weeks.   Discharge Planning: Residential hospice appears most appropriate.   Care plan was discussed with  patient and mother.    Thank you for allowing the Palliative Medicine Team to assist in the care of this patient.   Time In: 1300 Time Out: 1335 Total Time 35 Prolonged Time Billed  no       Greater than 50%  of this time was spent counseling and coordinating care related to the above assessment and plan.  Loistine Chance, MD  Please contact Palliative Medicine Team phone at [phone] for questions and concerns.

## 2020-12-02 NOTE — Assessment & Plan Note (Signed)
She continues to decline.  Not eating at this point, unable to hold a cup.  Mostly somnolent.  - Continue methadone 10 TID - Continue gabapentin bupropion - No venipuncture, no monitoring, liberalize family visitation

## 2020-12-02 NOTE — Progress Notes (Addendum)
  Progress Note    Kara Pittman   GRJ:[id number]  DOB: 01-30-1966  DOA: 11/22/2020     10 Date of Service: 12/02/2020    Brief summary: Kara Pittman is a 55 y.o. F with metastatic BrCA, hypothyroidism who presented for tachycardia, malaise, and weakness.    In the ER, Chest imaging showed bilateral opacities and she was hypoxic.    10/18: Admitted on antibiotics 10/21-10/24: Fevers recur, despite antibiotics 10/25: Mental status worsening, transfusion given, code status changed 10/26-10/28: mentation worsened, now not able to get up, family feel she is too unstable to transport, expect in-hospital death        Assessment and Plan Stage IV breast cancer in female Kara Pittman) She continues to decline.  Not eating at this point, unable to hold a cup.  Mostly somnolent.  - Continue methadone 10 TID - Continue gabapentin bupropion - No venipuncture, no monitoring, liberalize family visitation  Acute metabolic encephalopathy     Worsening     Subjective:  Very alert overnight, getting weaker, more somnolent.  No focal complaints.  No fever.  Objective Vitals:   12/01/20 2118 12/02/20 0543 12/02/20 0544 12/02/20 1315  BP: 113/68 135/83 135/83 101/71  Pulse: (!) 125 (!) 131 (!) 130 (!) 108  Resp: _0 Temp: 98.5 F (36.9 C) 99.4 F (37.4 C) 99.4 F (37.4 C) 97.8 F (36.6 C)  TempSrc: Oral Oral Oral Oral  SpO2: 94% 94% 94% 97%  Weight:      Height:       64.4 kg   Exam Vital signs reviewed General appearance: Debilitated adult female, lying in bed, sleeping, arouses briefly to voice, makes eye contact then falls back asleep     HEENT:   Skin:  Cardiac: Tachycardic, regular, I do not appreciate murmurs, mild diffuse nonpitting edema Respiratory: Respiratory effort shallow, lung sounds diminished, no wheezing Abdomen: Abdomen distended, soft, no obvious tenderness or rigidity MSK:  Neuro: Makes eye contact but falls back asleep, speech fluent, face  symmetric, strength 3/5 bilaterally, very weak. Psych: Attention diminished, affect blunted.    Labs / Other Information There are no new results to review at this time.   Disposition Plan: Status is: Inpatient  Remains inpatient appropriate because: Actively dying, expected hospital death        Time spent: 25 minutes [facility] 12/02/2020, 2:42 PM

## 2020-12-02 NOTE — TOC Transition Note (Signed)
Transition of Care [facility]) - CM/SW Discharge Note  Patient Details  Name: Kara Pittman MRN: [id number] Date of Birth: [birth year]  Transition of Care [facility]) CM/SW Contact:  Sherie Don, LCSW Phone Number: 12/02/2020, 4:25 PM  Clinical Narrative: Plan has changed from home with hospice to residential hospice. Son, Erlene Quan, is agreeable to [facility] as [facility] has an 8 person waiting list. CSW made referral to [place] with [place]. Patient is expected to be admitted today pending admission paperwork being completed and getting the bed ready. A tentative discharge packet is on the nursing floor. The number for report is [phone].  Final next level of care: [place] Barriers to Discharge: Barriers Resolved   Patient Goals and CMS Choice Patient states their goals for this hospitalization and ongoing recovery are:: Discharge to hospice facility CMS Medicare.gov Compare Post Acute Care list provided to:: Patient Represenative (must comment) Choice offered to / list presented to : Adult Children  Discharge Placement                Patient to be transferred to facility by: [place] Name of family member notified: Erlene Quan (son) Ph: [phone] Patient and family notified of of transfer: 12/02/20  Discharge Plan and Services In-house Referral: Clinical Social Work, Hospice / [place] Acute Care Choice: Hospice          DME Arranged: N/A DME Agency: NA                  Social Determinants of Health (SDOH) Interventions     Readmission Risk Interventions Readmission Risk Prevention Plan 11/24/2020  Transportation Screening Complete  Medication Review Press photographer) Complete  PCP or Specialist appointment within 3-5 days of discharge Not Complete  PCP/Specialist Appt Not Complete comments Patient will discharge home with hospice services  SW Recovery Care/Counseling Consult Complete  Palliative  Care Screening Not [place] Not Applicable  Some recent data might be hidden

## 2020-12-03 DIAGNOSIS — R6520 Severe sepsis without septic shock: Secondary | ICD-10-CM | POA: Diagnosis not present

## 2020-12-03 DIAGNOSIS — A419 Sepsis, unspecified organism: Secondary | ICD-10-CM | POA: Diagnosis not present

## 2020-12-03 MED ORDER — HYDROMORPHONE HCL 1 MG/ML IJ SOLN
1.5000 mg | INTRAMUSCULAR | Status: DC | PRN
  Administered 2020-12-03 (×2): 1.5 mg via INTRAVENOUS
  Filled 2020-12-03 (×2): qty 1.5

## 2020-12-03 MED ORDER — HEPARIN SOD (PORK) LOCK FLUSH 100 UNIT/ML IV SOLN
500.0000 [IU] | INTRAVENOUS | Status: AC | PRN
  Administered 2020-12-03: 500 [IU]
  Filled 2020-12-03: qty 5

## 2020-12-03 MED ORDER — METHOCARBAMOL 500 MG PO TABS: 500.0000 mg | ORAL_TABLET | Freq: Three times a day (TID) | ORAL | Status: AC | PRN

## 2020-12-03 MED ORDER — HYDROMORPHONE HCL 1 MG/ML IJ SOLN: 1.5000 mg | INTRAMUSCULAR | 0 refills | Status: AC | PRN

## 2020-12-03 MED ORDER — HYDROMORPHONE HCL 1 MG/ML IJ SOLN: 1.0000 mg | INTRAMUSCULAR | Status: DC | PRN

## 2020-12-03 MED ORDER — DEXAMETHASONE SODIUM PHOSPHATE 10 MG/ML IJ SOLN
10.0000 mg | Freq: Once | INTRAMUSCULAR | Status: AC
  Administered 2020-12-03: 10 mg via INTRAVENOUS
  Filled 2020-12-03: qty 1

## 2020-12-03 MED ORDER — HYDROMORPHONE HCL 1 MG/ML IJ SOLN
1.0000 mg | INTRAMUSCULAR | Status: DC | PRN
  Administered 2020-12-03: 1 mg via INTRAVENOUS

## 2020-12-03 MED ORDER — ONDANSETRON HCL 4 MG/2ML IJ SOLN: 4.0000 mg | Freq: Four times a day (QID) | INTRAMUSCULAR | 0 refills | Status: AC | PRN

## 2020-12-03 MED ORDER — SENNOSIDES-DOCUSATE SODIUM 8.6-50 MG PO TABS: 1.0000 | ORAL_TABLET | Freq: Two times a day (BID) | ORAL | Status: AC | PRN

## 2020-12-03 MED ORDER — HYDROMORPHONE HCL 8 MG PO TABS: 8.0000 mg | ORAL_TABLET | ORAL | 0 refills | Status: AC | PRN

## 2020-12-03 MED ORDER — IPRATROPIUM-ALBUTEROL 0.5-2.5 (3) MG/3ML IN SOLN: 3.0000 mL | RESPIRATORY_TRACT | Status: AC | PRN

## 2020-12-03 MED ORDER — HYDROMORPHONE HCL 1 MG/ML IJ SOLN
1.5000 mg | INTRAMUSCULAR | Status: DC
  Administered 2020-12-03 (×2): 1.5 mg via INTRAVENOUS
  Filled 2020-12-03 (×2): qty 1.5

## 2020-12-03 NOTE — Progress Notes (Signed)
PTAR arrived to unit.

## 2020-12-03 NOTE — Progress Notes (Signed)
Report called to Brad at facility.

## 2020-12-03 NOTE — Discharge Summary (Signed)
Physician Discharge Summary   Patient name: Kara Pittman  Admit date:     11/22/2020  Discharge date: 12/03/2020  Discharge Physician: Edwin Dada   PCP: [name], MD       Discharge Diagnoses Principal Problem:   Severe sepsis [facility]) Active Problems:   Stage IV breast cancer in female [facility])   Acute metabolic encephalopathy   Pneumonia (post obstructive pneumonia due to pulmonary metastasis)   Pulmonary embolism (HCC)   Open wound of right chest wall   Hypokalemia   Hyponatremia   Thrombocytopenia ([place])   Hypothyroid   Anemia of chronic disease     Hospital Course   Kara Pittman is a 55 y.o. F with metastatic BrCA, hypothyroidism who presented for tachycardia, malaise, and weakness.    In the ER, chest imaging showed bilateral opacities and she was hypoxic.  She had heart rate 123, BP 98 mmHg systolic, hypoxia with tachypnea to 22-24 and respiratory failure with hypoxia to mid-80s.  She was initially admitted on broad spectrum intravenous antibiotics and IV fluids.  Although her hemodynamics improved initially with fluids, her fevers recurred, despite treatment and then her mental status began to worsen, and she developed anemia refractory to transfusion.    The case was discussed with her primary Oncologist at [facility], and family and patient met with Palliative care and elected to enroll in Hospice.  Her mentation and functional status worsened, and the choice was made to transfer to residential Hospice.            Condition at discharge: worsening  Exam Physical Exam Constitutional:      Appearance: She is ill-appearing and diaphoretic.  Cardiovascular:     Rate and Rhythm: Tachycardia present.     Heart sounds: No murmur heard. Pulmonary:     Effort: Tachypnea present.     Breath sounds: Decreased air movement present. Decreased breath sounds present.  Musculoskeletal:        General: Swelling present.  Skin:    Coloration: Skin is  pale.     Findings: Lesion present.  Neurological:     Mental Status: She is lethargic.     Motor: Weakness present.  Psychiatric:        Mood and Affect: Affect is blunt.        Cognition and Memory: Cognition is impaired.      Disposition: Hospice care  Discharge time: greater than 30 minutes.   Allergies as of 12/03/2020       Reactions   Morphine And Related Hives   Hydrocodone-acetaminophen Nausea Only   Vancomycin Other (See Comments)   Redman's Syndrome        Medication List     STOP taking these medications    apixaban 5 MG Tabs tablet Commonly known as: ELIQUIS   benzonatate 100 MG capsule Commonly known as: TESSALON   buPROPion 150 MG 24 hr tablet Commonly known as: WELLBUTRIN XL   [employer] TRIACTING EXPECTORANT PO   Eliquis 5 MG Tabs tablet Generic drug: apixaban   loratadine 10 MG tablet Commonly known as: CLARITIN   ondansetron 4 MG disintegrating tablet Commonly known as: ZOFRAN-ODT   ondansetron 8 MG tablet Commonly known as: ZOFRAN Replaced by: ondansetron 4 MG/2ML Soln injection   oxyCODONE 15 MG immediate release tablet Commonly known as: ROXICODONE   Oxycodone HCl 10 MG Tabs   OxyCONTIN 20 mg 12 hr tablet Generic drug: oxyCODONE   OxyCONTIN 40 mg 12 hr tablet Generic drug: oxyCODONE  pantoprazole 40 MG tablet Commonly known as: PROTONIX   Synthroid 75 MCG tablet Generic drug: levothyroxine   Udenyca 6 MG/0.6ML injection Generic drug: pegfilgrastim-cbqv       TAKE these medications    acetaminophen 500 MG tablet Commonly known as: TYLENOL Take 500-1,000 mg by mouth every 6 (six) hours as needed for mild pain or headache.   dexamethasone 2 MG tablet Commonly known as: DECADRON Take 1 tablet by mouth daily with breakfast   gabapentin 300 MG capsule Commonly known as: NEURONTIN Take 1 capsule by mouth at bedtime for 3 days then take 1 capsule twice daily for 3 days then take 1 capsule 3 times daily What changed:   how much to take how to take this when to take this   HYDROmorphone 8 MG tablet Commonly known as: DILAUDID Take 1 tablet (8 mg total) by mouth every 3 (three) hours as needed for severe pain. What changed:  when to take this reasons to take this   HYDROmorphone 1 MG/ML injection Commonly known as: DILAUDID Inject 1.5 mLs (1.5 mg total) into the vein every 2 (two) hours as needed (Breakthrough pain only. Do not use if taking Dilaudid PO.). What changed: You were already taking a medication with the same name, and this prescription was added. Make sure you understand how and when to take each.   ipratropium-albuterol 0.5-2.5 (3) MG/3ML Soln Commonly known as: DUONEB Take 3 mLs by nebulization every 4 (four) hours as needed.   Lidocaine 4 % Ptch Apply 3 patchs topically once daily for 10 days as directed What changed:  how much to take how to take this when to take this reasons to take this Another medication with the same name was removed. Continue taking this medication, and follow the directions you see here.   methadone 10 MG tablet Commonly known as: DOLOPHINE Take 10 mg by mouth every 8 (eight) hours.   methocarbamol 500 MG tablet Commonly known as: ROBAXIN Take 1 tablet (500 mg total) by mouth every 8 (eight) hours as needed for muscle spasms.   ondansetron 4 MG/2ML Soln injection Commonly known as: ZOFRAN Inject 2 mLs (4 mg total) into the vein every 6 (six) hours as needed for nausea. Replaces: ondansetron 8 MG tablet   senna-docusate 8.6-50 MG tablet Commonly known as: Senokot-S Take 1-2 tablets by mouth 2 (two) times daily as needed for moderate constipation.               Discharge Care Instructions  (From admission, onward)           Start     Ordered   12/03/20 0000  Discharge wound care:       Comments: Apply Xeroform gauze.   Replace dressings as needed   12/03/20 1339            DG Chest Port 1 View  Result Date:  11/22/2020 CLINICAL DATA:  Undergoing treatment for breast cancer. Question sepsis. EXAM: PORTABLE CHEST 1 VIEW COMPARISON:  09/30/2020 FINDINGS: Power port inserted from the left has its tip at the SVC RA junction. Left lung shows a stable infrahilar density consistent with metastatic disease. Increasing mass densities at the right apex and right lateral lower lung. This could be due to growth of tumor. Some adjacent pneumonia not excluded. No significant pleural fluid accumulation. IMPRESSION: Increasing mass like densities at the right apex and lower lateral right lung presumed represent progressive tumor. Some adjacent pulmonary infiltrate not excluded. Infrahilar pulmonary density on  the left, proximally stable since the prior studies peer Electronically Signed   By: Nelson Chimes M.D.   On: 11/22/2020 13:49   Results for orders placed or performed during the hospital encounter of 11/22/20  Blood Culture (routine x 2)     Status: None   Collection Time: 11/22/20 12:30 PM   Specimen: BLOOD  Result Value Ref Range Status   Specimen Description   Final    BLOOD RIGHT ANTECUBITAL Performed at [facility] [address] [place], [place] [address]    Special Requests   Final    BOTTLES DRAWN AEROBIC AND ANAEROBIC Blood Culture results may not be optimal due to an inadequate volume of blood received in culture bottles Performed at [facility] [address], [place], [place] [address]    Culture   Final    NO GROWTH 5 DAYS Performed at [facility], [place] [address] [place], [place] [address]    Report Status 11/27/2020 FINAL  Final  Blood Culture (routine x 2)     Status: None   Collection Time: 11/22/20 12:52 PM   Specimen: BLOOD  Result Value Ref Range Status   Specimen Description   Final    BLOOD LEFT ANTECUBITAL Performed at [facility] [address], [place], [place] [address]    Special Requests   Final    BOTTLES  DRAWN AEROBIC AND ANAEROBIC Blood Culture adequate volume Performed at [facility] [address] [place], [place] [address]    Culture   Final    NO GROWTH 5 DAYS Performed at [facility], [place] [address], [place], [place] [address]    Report Status 11/27/2020 FINAL  Final  Resp Panel by RT-PCR (Flu A&B, Covid) Peripheral     Status: None   Collection Time: 11/22/20 12:52 PM   Specimen: Peripheral; Nasopharyngeal(NP) swabs in vial transport medium  Result Value Ref Range Status   SARS Coronavirus 2 by RT PCR NEGATIVE NEGATIVE Final    Comment: (NOTE) SARS-CoV-2 target nucleic acids are NOT DETECTED.  The SARS-CoV-2 RNA is generally detectable in upper respiratory specimens during the acute phase of infection. The lowest concentration of SARS-CoV-2 viral copies this assay can detect is 138 copies/mL. A negative result does not preclude SARS-Cov-2 infection and should not be used as the sole basis for treatment or other patient management decisions. A negative result may occur with  improper specimen collection/handling, submission of specimen other than nasopharyngeal swab, presence of viral mutation(s) within the areas targeted by this assay, and inadequate number of viral copies(<138 copies/mL). A negative result must be combined with clinical observations, patient history, and epidemiological information. The expected result is Negative.  Fact Sheet for Patients:  EntrepreneurPulse.com.au  Fact Sheet for Healthcare Providers:  IncredibleEmployment.be  This test is no t yet approved or cleared by the [place] FDA and  has been authorized for detection and/or diagnosis of SARS-CoV-2 by FDA under an Emergency Use Authorization (EUA). This EUA will remain  in effect (meaning this test can be used) for the duration of the COVID-19 declaration under Section 564(b)(1) of the Act, 21 U.S.C.section 360bbb-3(b)(1),  unless the authorization is terminated  or revoked sooner.       Influenza A by PCR NEGATIVE NEGATIVE Final   Influenza B by PCR NEGATIVE NEGATIVE Final    Comment: (NOTE) The Xpert Xpress SARS-CoV-2/FLU/RSV plus assay is intended as an aid in the diagnosis of  influenza from Nasopharyngeal swab specimens and should not be used as a sole basis for treatment. Nasal washings and aspirates are unacceptable for Xpert Xpress SARS-CoV-2/FLU/RSV testing.  Fact Sheet for Patients: EntrepreneurPulse.com.au  Fact Sheet for Healthcare Providers: IncredibleEmployment.be  This test is not yet approved or cleared by the [place] FDA and has been authorized for detection and/or diagnosis of SARS-CoV-2 by FDA under an Emergency Use Authorization (EUA). This EUA will remain in effect (meaning this test can be used) for the duration of the COVID-19 declaration under Section 564(b)(1) of the Act, 21 U.S.C. section 360bbb-3(b)(1), unless the authorization is terminated or revoked.  Performed at [facility], [place] [address] [place], [place] [address]   MRSA Next Gen by PCR, Nasal     Status: None   Collection Time: 11/22/20  9:00 PM   Specimen: Nasal Mucosa; Nasal Swab  Result Value Ref Range Status   MRSA by PCR Next Gen NOT DETECTED NOT DETECTED Final    Comment: (NOTE) The GeneXpert MRSA Assay (FDA approved for NASAL specimens only), is one component of a comprehensive MRSA colonization surveillance program. It is not intended to diagnose MRSA infection nor to guide or monitor treatment for MRSA infections. Test performance is not FDA approved in patients less than 46 years old. Performed at [facility], [place] [address] [place], [place] [address]   Urine Culture     Status: None   Collection Time: 11/23/20  3:00 AM   Specimen: In/Out Cath Urine  Result Value Ref Range Status   Specimen Description   Final     IN/OUT CATH URINE Performed at [facility] [address], [place], [place] [address]    Special Requests   Final    NONE Performed at [facility], [place] [address], [place], [place] [address]    Culture   Final    NO GROWTH Performed at [facility], [place] [address] [place], [place] [address]    Report Status 11/23/2020 FINAL  Final    Signed:  Edwin Dada MD.  [facility] 12/03/2020, 1:42 PM

## 2020-12-03 NOTE — Clinical Social Work Note (Signed)
CSW confirmed with Pat with [facility] that patient was accepted and her bed will be ready at 4pm today. Medical necessity form done; PTAR scheduled. Discharge packet done. CSW updated hospitalist and RN. CSW notified son of transfer. TOC signing off.

## 2020-12-07 ENCOUNTER — Telehealth: Payer: Self-pay

## 2020-12-07 NOTE — Telephone Encounter (Signed)
I appreciate the notification.  She had the bad cancer and died too young.  So sad.

## 2020-12-07 NOTE — Telephone Encounter (Signed)
[facility] calls nurse line to let PCP know the patient has passed away.   Patient passed away on 12-24-2022 at their facility.

## 2021-01-05 DEATH — deceased
